# Patient Record
Sex: Male | Born: 1965 | Race: White | Hispanic: No | Marital: Married | Smoking: Former smoker
Health system: Southern US, Community
[De-identification: ages and names within clinical notes are randomized; demographics above are authoritative.]

## PROBLEM LIST (undated history)

## (undated) DIAGNOSIS — E119 Type 2 diabetes mellitus without complications: Secondary | ICD-10-CM

## (undated) DIAGNOSIS — M1990 Unspecified osteoarthritis, unspecified site: Secondary | ICD-10-CM

## (undated) DIAGNOSIS — K3184 Gastroparesis: Secondary | ICD-10-CM

## (undated) DIAGNOSIS — I10 Essential (primary) hypertension: Secondary | ICD-10-CM

## (undated) DIAGNOSIS — G8929 Other chronic pain: Secondary | ICD-10-CM

## (undated) DIAGNOSIS — E669 Obesity, unspecified: Secondary | ICD-10-CM

## (undated) DIAGNOSIS — E785 Hyperlipidemia, unspecified: Secondary | ICD-10-CM

## (undated) DIAGNOSIS — K219 Gastro-esophageal reflux disease without esophagitis: Secondary | ICD-10-CM

## (undated) DIAGNOSIS — K2270 Barrett's esophagus without dysplasia: Secondary | ICD-10-CM

## (undated) HISTORY — PX: HIP SURGERY: SHX245

## (undated) HISTORY — DX: Other chronic pain: G89.29

## (undated) HISTORY — PX: FRACTURE SURGERY: SHX138

## (undated) HISTORY — DX: Hyperlipidemia, unspecified: E78.5

## (undated) HISTORY — DX: Barrett's esophagus without dysplasia: K22.70

## (undated) HISTORY — DX: Gastroparesis: K31.84

## (undated) HISTORY — DX: Type 2 diabetes mellitus without complications: E11.9

## (undated) HISTORY — DX: Obesity, unspecified: E66.9

## (undated) HISTORY — DX: Unspecified osteoarthritis, unspecified site: M19.90

## (undated) HISTORY — DX: Gastro-esophageal reflux disease without esophagitis: K21.9

## (undated) HISTORY — PX: WRIST FRACTURE SURGERY: SHX121

## (undated) HISTORY — DX: Essential (primary) hypertension: I10

---

## 2005-03-04 HISTORY — PX: FRACTURE SURGERY: SHX138

## 2014-08-27 ENCOUNTER — Ambulatory Visit: Payer: Self-pay | Admitting: Family Medicine

## 2014-09-11 ENCOUNTER — Encounter: Payer: Self-pay | Admitting: Family Medicine

## 2014-09-11 ENCOUNTER — Encounter (INDEPENDENT_AMBULATORY_CARE_PROVIDER_SITE_OTHER): Payer: Self-pay

## 2014-09-11 ENCOUNTER — Ambulatory Visit (INDEPENDENT_AMBULATORY_CARE_PROVIDER_SITE_OTHER): Payer: Commercial Managed Care - HMO | Admitting: Family Medicine

## 2014-09-11 VITALS — BP 120/74 | HR 74 | Temp 97.3°F | Ht 73.0 in | Wt 272.2 lb

## 2014-09-11 DIAGNOSIS — I10 Essential (primary) hypertension: Secondary | ICD-10-CM

## 2014-09-11 DIAGNOSIS — M79673 Pain in unspecified foot: Secondary | ICD-10-CM

## 2014-09-11 DIAGNOSIS — E1165 Type 2 diabetes mellitus with hyperglycemia: Secondary | ICD-10-CM | POA: Insufficient documentation

## 2014-09-11 DIAGNOSIS — G8929 Other chronic pain: Secondary | ICD-10-CM

## 2014-09-11 DIAGNOSIS — M79671 Pain in right foot: Secondary | ICD-10-CM

## 2014-09-11 DIAGNOSIS — E1169 Type 2 diabetes mellitus with other specified complication: Secondary | ICD-10-CM | POA: Insufficient documentation

## 2014-09-11 DIAGNOSIS — I152 Hypertension secondary to endocrine disorders: Secondary | ICD-10-CM | POA: Insufficient documentation

## 2014-09-11 DIAGNOSIS — E785 Hyperlipidemia, unspecified: Secondary | ICD-10-CM | POA: Diagnosis not present

## 2014-09-11 DIAGNOSIS — IMO0002 Reserved for concepts with insufficient information to code with codable children: Secondary | ICD-10-CM

## 2014-09-11 DIAGNOSIS — E1159 Type 2 diabetes mellitus with other circulatory complications: Secondary | ICD-10-CM | POA: Insufficient documentation

## 2014-09-11 LAB — POCT GLYCOSYLATED HEMOGLOBIN (HGB A1C): Hemoglobin A1C: 7.1

## 2014-09-11 MED ORDER — OXYCODONE HCL 10 MG PO TABS: 10.0000 mg | ORAL_TABLET | Freq: Four times a day (QID) | ORAL | Status: DC

## 2014-09-11 MED ORDER — JANUVIA 100 MG PO TABS: 100.0000 mg | ORAL_TABLET | Freq: Every day | ORAL | Status: DC

## 2014-09-11 MED ORDER — METFORMIN HCL 1000 MG PO TABS: 1000.0000 mg | ORAL_TABLET | Freq: Two times a day (BID) | ORAL | Status: DC

## 2014-09-11 MED ORDER — SIMVASTATIN 20 MG PO TABS: 20.0000 mg | ORAL_TABLET | Freq: Every day | ORAL | Status: DC

## 2014-09-11 MED ORDER — GLIPIZIDE ER 10 MG PO TB24: 10.0000 mg | ORAL_TABLET | Freq: Every day | ORAL | Status: DC

## 2014-09-11 MED ORDER — LISINOPRIL 20 MG PO TABS: 20.0000 mg | ORAL_TABLET | Freq: Every day | ORAL | Status: DC

## 2014-09-11 MED ORDER — METFORMIN HCL 1000 MG PO TABS
1000.0000 mg | ORAL_TABLET | Freq: Two times a day (BID) | ORAL | Status: DC
Start: 2014-09-11 — End: 2014-09-11

## 2014-09-11 NOTE — Patient Instructions (Signed)
Diabetes and Exercise Exercising regularly is important. It is not just about losing weight. It has many health benefits, such as:  Improving your overall fitness, flexibility, and endurance.  Increasing your bone density.  Helping with weight control.  Decreasing your body fat.  Increasing your muscle strength.  Reducing stress and tension.  Improving your overall health. People with diabetes who exercise gain additional benefits because exercise:  Reduces appetite.  Improves the body's use of blood sugar (glucose).  Helps lower or control blood glucose.  Decreases blood pressure.  Helps control blood lipids (such as cholesterol and triglycerides).  Improves the body's use of the hormone insulin by:  Increasing the body's insulin sensitivity.  Reducing the body's insulin needs.  Decreases the risk for heart disease because exercising:  Lowers cholesterol and triglycerides levels.  Increases the levels of good cholesterol (such as high-density lipoproteins [HDL]) in the body.  Lowers blood glucose levels. YOUR ACTIVITY PLAN  Choose an activity that you enjoy and set realistic goals. Your health care provider or diabetes educator can help you make an activity plan that works for you. Exercise regularly as directed by your health care provider. This includes:  Performing resistance training twice a week such as push-ups, sit-ups, lifting weights, or using resistance bands.  Performing 150 minutes of cardio exercises each week such as walking, running, or playing sports.  Staying active and spending no more than 90 minutes at one time being inactive. Even short bursts of exercise are good for you. Three 10-minute sessions spread throughout the day are just as beneficial as a single 30-minute session. Some exercise ideas include:  Taking the dog for a walk.  Taking the stairs instead of the elevator.  Dancing to your favorite song.  Doing an exercise  video.  Doing your favorite exercise with a friend. RECOMMENDATIONS FOR EXERCISING WITH TYPE 1 OR TYPE 2 DIABETES   Check your blood glucose before exercising. If blood glucose levels are greater than 240 mg/dL, check for urine ketones. Do not exercise if ketones are present.  Avoid injecting insulin into areas of the body that are going to be exercised. For example, avoid injecting insulin into:  The arms when playing tennis.  The legs when jogging.  Keep a record of:  Food intake before and after you exercise.  Expected peak times of insulin action.  Blood glucose levels before and after you exercise.  The type and amount of exercise you have done.  Review your records with your health care provider. Your health care provider will help you to develop guidelines for adjusting food intake and insulin amounts before and after exercising.  If you take insulin or oral hypoglycemic agents, watch for signs and symptoms of hypoglycemia. They include:  Dizziness.  Shaking.  Sweating.  Chills.  Confusion.  Drink plenty of water while you exercise to prevent dehydration or heat stroke. Body water is lost during exercise and must be replaced.  Talk to your health care provider before starting an exercise program to make sure it is safe for you. Remember, almost any type of activity is better than none. Document Released: 03/13/2003 Document Revised: 05/07/2013 Document Reviewed: 05/30/2012 [employer] Patient Information 2015 [employer]. This information is not intended to replace advice given to you by your health care provider. Make sure you discuss any questions you have with your health care provider.  

## 2014-09-11 NOTE — Progress Notes (Signed)
BP 120/74 mmHg  Pulse 74  Temp(Src) 97.3 F (36.3 C) (Oral)  Ht _0  (1.854 m)  Wt 272 lb 3.2 oz (123.469 kg)  BMI 35.92 kg/m2   Subjective:    Patient ID: Timothy Bouche., male    DOB: 05-06-1965, 49 y.o.   MRN: [id number]  HPI: Timothy Pocock. is a 49 y.o. male presenting on 09/11/2014 for Establish Care   HPI Hypertension Patient presents today as a new patient for checkup of his hypertension. He is currently on lisinopril 20 mg. Per patient his blood pressure has been controlled on this medication for quite some time. Patient denies headaches, blurred vision, chest pains, shortness of breath, or weakness. Denies any side effects from medication and is content with current medication.  Diabetes type 2 Patient has type 2 diabetes and is currently moved down here from [place]. He is currently on glipizide 10 mg and Januvia 100 mg and metformin 1000 mg twice daily. The A1c today is 7.1. Patient has not been consistent in his diabetic monitoring, but has taken his medications regularly. Over the past month or 2 he has been in the process of moving down and traveling back and forth between here in [place] and has admittedly not eating as well as he had before. He denies any visual changes or tingling or numbness in his feet.  Hyperlipidemia Patient is taking simvastatin 20 mg currently has not had a cholesterol recheck in a little bit. He denies any myalgias or liver problems in the past from the simvastatin and is agreeable to continue taking it. Has not had any cardiac history or any young family cardiac history is either.  Chronic foot pain Patient fell from a roof quite a few years ago fracturing both tib-fib bilaterally. He has had a few surgeries on his feet for this event has fusion of the bones in his feet. He has been on oxycodone 10 mg 4 times a day for some time has been controlled on that dose.  Relevant past medical, surgical, family and social history reviewed and updated as  indicated. Interim medical history since our last visit reviewed. Allergies and medications reviewed and updated.  Review of Systems  Constitutional: Negative for fever and chills.  HENT: Negative for ear discharge, ear pain, postnasal drip and sore throat.   Eyes: Negative for discharge, redness and visual disturbance.  Respiratory: Negative for chest tightness, shortness of breath and wheezing.   Cardiovascular: Negative for chest pain, palpitations and leg swelling.  Gastrointestinal: Negative for abdominal pain, diarrhea and constipation.  Endocrine: Negative for cold intolerance, heat intolerance, polydipsia, polyphagia and polyuria.  Genitourinary: Negative for difficulty urinating.  Musculoskeletal: Positive for arthralgias (feet and ankles) and gait problem. Negative for back pain.  Skin: Negative for rash.  Neurological: Negative for dizziness, syncope, light-headedness and headaches.  All other systems reviewed and are negative.   Per HPI unless specifically indicated above     Medication List       This list is accurate as of: 09/11/14 11:31 AM.  Always use your most recent med list.               glipiZIDE 10 MG 24 hr tablet  Commonly known as:  GLUCOTROL XL  Take 1 tablet (10 mg total) by mouth daily.     JANUVIA 100 MG tablet  Generic drug:  sitaGLIPtin  Take 1 tablet (100 mg total) by mouth daily.     lisinopril 20 MG tablet  Commonly known as:  PRINIVIL,ZESTRIL  Take 1 tablet (20 mg total) by mouth daily.     metFORMIN 1000 MG tablet  Commonly known as:  GLUCOPHAGE  Take 1 tablet (1,000 mg total) by mouth 2 (two) times daily.     Oxycodone HCl 10 MG Tabs  Take 1 tablet (10 mg total) by mouth 4 (four) times daily.     simvastatin 20 MG tablet  Commonly known as:  ZOCOR  Take 1 tablet (20 mg total) by mouth daily at 6 PM.           Objective:    BP 120/74 mmHg  Pulse 74  Temp(Src) 97.3 F (36.3 C) (Oral)  Ht _0  (1.854 m)  Wt 272 lb 3.2  oz (123.469 kg)  BMI 35.92 kg/m2  Wt Readings from Last 3 Encounters:  09/11/14 272 lb 3.2 oz (123.469 kg)    Physical Exam  Constitutional: He is oriented to person, place, and time. He appears well-developed and well-nourished. No distress.  Eyes: Conjunctivae and EOM are normal. Pupils are equal, round, and reactive to light. Right eye exhibits no discharge. No scleral icterus.  Neck: Neck supple. No thyromegaly present.  Cardiovascular: Normal rate, regular rhythm, normal heart sounds and intact distal pulses.   No murmur heard. Pulmonary/Chest: Effort normal and breath sounds normal. No respiratory distress. He has no wheezes.  Abdominal: Soft. Bowel sounds are normal. He exhibits no distension. There is no tenderness.  Musculoskeletal: He exhibits no edema.  Decreased range of motion in both ankles with scar lines anteriorly from where it had surgeries  Lymphadenopathy:    He has no cervical adenopathy.  Neurological: He is alert and oriented to person, place, and time. Coordination normal.  Skin: Skin is warm and dry. No rash noted. He is not diaphoretic.  Psychiatric: He has a normal mood and affect. His behavior is normal.  Vitals reviewed.   Results for orders placed or performed in visit on 09/11/14  POCT glycosylated hemoglobin (Hb A1C)  Result Value Ref Range   Hemoglobin A1C 7.1       Assessment & Plan:   Problem List Items Addressed This Visit      Cardiovascular and Mediastinum   Essential hypertension, benign    He is currently on lisinopril more for his renal protection from diabetes then for blood pressure but blood pressure is controlled today so we will continue      Relevant Medications   lisinopril (PRINIVIL,ZESTRIL) 20 MG tablet   simvastatin (ZOCOR) 20 MG tablet     Other   Diabetes type 2, uncontrolled - Primary    Hemoglobin A1c is 7.1 today. We will continue his lip is a Januvia and metformin for his diabetes, but he will likely need additional  medication such as insulin in the future but he is resistant today. We'll rediscuss at a later date. He still takes his lisinopril and he is also on a statin.      Relevant Medications   glipiZIDE (GLUCOTROL XL) 10 MG 24 hr tablet   JANUVIA 100 MG tablet   lisinopril (PRINIVIL,ZESTRIL) 20 MG tablet   metFORMIN (GLUCOPHAGE) 1000 MG tablet   simvastatin (ZOCOR) 20 MG tablet   Other Relevant Orders   POCT glycosylated hemoglobin (Hb A1C) (Completed)   Thyroid Panel With TSH (Completed)   CMP14+EGFR (Completed)   Hyperlipidemia LDL goal <100    Patient currently on simvastatin, we will continue it for today and check his lab values.  Relevant Medications   lisinopril (PRINIVIL,ZESTRIL) 20 MG tablet   simvastatin (ZOCOR) 20 MG tablet   Other Relevant Orders   Lipid panel (Completed)   Chronic foot pain    Patient has chronic foot pain and is about oxycodone 10 mg 4 times daily. We will continue for today and discuss referral to pain management at a later date.      Relevant Medications   Oxycodone HCl 10 MG TABS       Follow up plan: Return in about 3 months (around 12/11/2014), or if symptoms worsen or fail to improve.  Caryl Pina, MD [facility] 09/11/2014, 11:31 AM

## 2014-09-12 LAB — CMP14+EGFR
ALT: 28 IU/L (ref 0–44)
AST: 21 IU/L (ref 0–40)
Albumin/Globulin Ratio: 1.8 (ref 1.1–2.5)
Albumin: 4.4 g/dL (ref 3.5–5.5)
Alkaline Phosphatase: 92 IU/L (ref 39–117)
BUN/Creatinine Ratio: 19 (ref 9–20)
BUN: 14 mg/dL (ref 6–24)
Bilirubin Total: <0.2 mg/dL (ref 0.0–1.2)
CO2: 22 mmol/L (ref 18–29)
Calcium: 9.8 mg/dL (ref 8.7–10.2)
Chloride: 99 mmol/L (ref 97–108)
Creatinine, Ser: 0.73 mg/dL — ABNORMAL LOW (ref 0.76–1.27)
GFR calc Af Amer: 126 mL/min/{1.73_m2} (ref >59)
GFR calc non Af Amer: 109 mL/min/{1.73_m2} (ref >59)
Globulin, Total: 2.5 g/dL (ref 1.5–4.5)
Glucose: 115 mg/dL — ABNORMAL HIGH (ref 65–99)
Potassium: 4.5 mmol/L (ref 3.5–5.2)
Sodium: 138 mmol/L (ref 134–144)
Total Protein: 6.9 g/dL (ref 6.0–8.5)

## 2014-09-12 LAB — LIPID PANEL
Chol/HDL Ratio: 3.4 ratio units (ref 0.0–5.0)
Cholesterol, Total: 144 mg/dL (ref 100–199)
HDL: 42 mg/dL (ref >39)
LDL Calculated: 62 mg/dL (ref 0–99)
Triglycerides: 198 mg/dL — ABNORMAL HIGH (ref 0–149)
VLDL Cholesterol Cal: 40 mg/dL (ref 5–40)

## 2014-09-12 LAB — THYROID PANEL WITH TSH
Free Thyroxine Index: 2.1 (ref 1.2–4.9)
T3 Uptake Ratio: 24 % (ref 24–39)
T4, Total: 8.7 ug/dL (ref 4.5–12.0)
TSH: 1.27 u[IU]/mL (ref 0.450–4.500)

## 2014-09-12 NOTE — Assessment & Plan Note (Signed)
Patient currently on simvastatin, we will continue it for today and check his lab values.

## 2014-09-12 NOTE — Assessment & Plan Note (Signed)
He is currently on lisinopril more for his renal protection from diabetes then for blood pressure but blood pressure is controlled today so we will continue

## 2014-09-12 NOTE — Assessment & Plan Note (Signed)
Patient has chronic foot pain and is about oxycodone 10 mg 4 times daily. We will continue for today and discuss referral to pain management at a later date.

## 2014-09-12 NOTE — Assessment & Plan Note (Signed)
Hemoglobin A1c is 7.1 today. We will continue his lip is a Januvia and metformin for his diabetes, but he will likely need additional medication such as insulin in the future but he is resistant today. We'll rediscuss at a later date. He still takes his lisinopril and he is also on a statin.

## 2014-09-18 ENCOUNTER — Telehealth: Payer: Self-pay | Admitting: Family Medicine

## 2014-09-18 NOTE — Telephone Encounter (Signed)
Erasmo Downer took care of this

## 2014-09-18 NOTE — Telephone Encounter (Signed)
Done 09/11/14

## 2014-10-03 DIAGNOSIS — E119 Type 2 diabetes mellitus without complications: Secondary | ICD-10-CM | POA: Diagnosis not present

## 2014-10-03 DIAGNOSIS — E109 Type 1 diabetes mellitus without complications: Secondary | ICD-10-CM | POA: Diagnosis not present

## 2014-10-03 DIAGNOSIS — I10 Essential (primary) hypertension: Secondary | ICD-10-CM | POA: Diagnosis not present

## 2014-10-03 DIAGNOSIS — H5210 Myopia, unspecified eye: Secondary | ICD-10-CM | POA: Diagnosis not present

## 2014-10-03 LAB — HM DIABETES EYE EXAM

## 2014-10-09 ENCOUNTER — Ambulatory Visit (INDEPENDENT_AMBULATORY_CARE_PROVIDER_SITE_OTHER): Payer: Commercial Managed Care - HMO | Admitting: Family Medicine

## 2014-10-09 ENCOUNTER — Encounter: Payer: Self-pay | Admitting: Family Medicine

## 2014-10-09 VITALS — BP 131/78 | HR 89 | Temp 97.5°F | Ht 73.0 in | Wt 276.4 lb

## 2014-10-09 DIAGNOSIS — E785 Hyperlipidemia, unspecified: Secondary | ICD-10-CM

## 2014-10-09 DIAGNOSIS — I10 Essential (primary) hypertension: Secondary | ICD-10-CM

## 2014-10-09 DIAGNOSIS — G8929 Other chronic pain: Secondary | ICD-10-CM | POA: Diagnosis not present

## 2014-10-09 DIAGNOSIS — IMO0001 Reserved for inherently not codable concepts without codable children: Secondary | ICD-10-CM

## 2014-10-09 DIAGNOSIS — M79671 Pain in right foot: Secondary | ICD-10-CM

## 2014-10-09 DIAGNOSIS — E1165 Type 2 diabetes mellitus with hyperglycemia: Secondary | ICD-10-CM

## 2014-10-09 MED ORDER — ACCU-CHEK AVIVA DEVI: Status: AC

## 2014-10-09 MED ORDER — OXYCODONE HCL 10 MG PO TABS: 10.0000 mg | ORAL_TABLET | Freq: Four times a day (QID) | ORAL | Status: DC

## 2014-10-09 MED ORDER — GLUCOSE BLOOD VI STRP
ORAL_STRIP | Status: DC
Start: 2014-10-09 — End: 2016-01-15

## 2014-10-09 MED ORDER — SIMVASTATIN 20 MG PO TABS: 20.0000 mg | ORAL_TABLET | Freq: Every day | ORAL | Status: DC

## 2014-10-09 MED ORDER — ACCU-CHEK SOFTCLIX LANCET DEV MISC: Status: DC

## 2014-10-09 NOTE — Progress Notes (Signed)
BP 131/78 mmHg  Pulse 89  Temp(Src) 97.5 F (36.4 C) (Oral)  Ht _0  (1.854 m)  Wt 276 lb 6.4 oz (125.374 kg)  BMI 36.47 kg/m2   Subjective:    Patient ID: Timothy Bouche., male    DOB: 02-27-1965, 49 y.o.   MRN: [id number]  HPI: Timothy Eveleth. is a 49 y.o. male presenting on 10/09/2014 for Medication Refill and Diabetic supplies needed   HPI Hypertension Patient here for hypertension recheck and blood pressure is controlled. Continue current medication.  Diabetes Patient here for recheck on type 2 diabetes and needs a refill on his meter and strips. His last A1c was 1 month ago and it was 7.1. He says he has been getting in the 120-140 range in early in the morning. Denies any tingling or numbness in his feet that is new. Plans to go see ophthalmologist.  Hyperlipidemia Last levels were doing good except for her triglycerides were elevated. We will continue his current dose of statin and recommended diet and exercise to reduce the triglycerides. Patient denies headaches, blurred vision, chest pains, shortness of breath, or weakness. Denies any side effects from medication and is content with current medication.  Chronic foot pain Here for refill of medications.   Relevant past medical, surgical, family and social history reviewed and updated as indicated. Interim medical history since our last visit reviewed. Allergies and medications reviewed and updated.  Review of Systems  Constitutional: Negative for fever.  HENT: Negative for ear discharge and ear pain.   Eyes: Negative for discharge and visual disturbance.  Respiratory: Negative for chest tightness, shortness of breath and wheezing.   Cardiovascular: Negative for chest pain, palpitations and leg swelling.  Gastrointestinal: Negative for abdominal pain, diarrhea and constipation.  Genitourinary: Negative for difficulty urinating.  Musculoskeletal: Positive for arthralgias (nothing changed or new). Negative for back  pain and gait problem.  Skin: Negative for rash.  Neurological: Negative for dizziness, syncope, light-headedness and headaches.  All other systems reviewed and are negative.   Per HPI unless specifically indicated above     Medication List       This list is accurate as of: 10/09/14  1:58 PM.  Always use your most recent med list.               ACCU-CHEK AVIVA device  Use as instructed     accu-chek softclix lancets  Use as instructed     glipiZIDE 10 MG 24 hr tablet  Commonly known as:  GLUCOTROL XL  Take 1 tablet (10 mg total) by mouth daily.     glucose blood test strip  Commonly known as:  ACCU-CHEK AVIVA  Use as instructed     JANUVIA 100 MG tablet  Generic drug:  sitaGLIPtin  Take 1 tablet (100 mg total) by mouth daily.     lisinopril 20 MG tablet  Commonly known as:  PRINIVIL,ZESTRIL  Take 1 tablet (20 mg total) by mouth daily.     metFORMIN 1000 MG tablet  Commonly known as:  GLUCOPHAGE  Take 1 tablet (1,000 mg total) by mouth 2 (two) times daily.     Oxycodone HCl 10 MG Tabs  Take 1 tablet (10 mg total) by mouth 4 (four) times daily.     Oxycodone HCl 10 MG Tabs  Take 1 tablet (10 mg total) by mouth 4 (four) times daily.     Oxycodone HCl 10 MG Tabs  Take 1 tablet (10 mg total) by mouth  4 (four) times daily.     simvastatin 20 MG tablet  Commonly known as:  ZOCOR  Take 1 tablet (20 mg total) by mouth daily at 6 PM.           Objective:    BP 131/78 mmHg  Pulse 89  Temp(Src) 97.5 F (36.4 C) (Oral)  Ht _0  (1.854 m)  Wt 276 lb 6.4 oz (125.374 kg)  BMI 36.47 kg/m2  Wt Readings from Last 3 Encounters:  10/09/14 276 lb 6.4 oz (125.374 kg)  09/11/14 272 lb 3.2 oz (123.469 kg)    Physical Exam  Constitutional: He is oriented to person, place, and time. He appears well-developed and well-nourished. No distress.  Eyes: Conjunctivae and EOM are normal. Right eye exhibits no discharge. No scleral icterus.  Cardiovascular: Normal rate,  regular rhythm, normal heart sounds and intact distal pulses.   No murmur heard. Pulmonary/Chest: Effort normal and breath sounds normal. No respiratory distress. He has no wheezes.  Musculoskeletal: Normal range of motion. He exhibits edema (1+ bilateral lower extremities).  Neurological: He is alert and oriented to person, place, and time. Coordination normal.  Skin: Skin is warm and dry. No rash noted. He is not diaphoretic.  Psychiatric: He has a normal mood and affect. His behavior is normal.  Vitals reviewed.   Results for orders placed or performed in visit on 09/11/14  Lipid panel  Result Value Ref Range   Cholesterol, Total 144 100 - 199 mg/dL   Triglycerides 198 (H) 0 - 149 mg/dL   HDL 42 >39 mg/dL   VLDL Cholesterol Cal 40 5 - 40 mg/dL   LDL Calculated 62 0 - 99 mg/dL   Chol/HDL Ratio 3.4 0.0 - 5.0 ratio units  Thyroid Panel With TSH  Result Value Ref Range   TSH 1.270 0.450 - 4.500 uIU/mL   T4, Total 8.7 4.5 - 12.0 ug/dL   T3 Uptake Ratio 24 24 - 39 %   Free Thyroxine Index 2.1 1.2 - 4.9  CMP14+EGFR  Result Value Ref Range   Glucose 115 (H) 65 - 99 mg/dL   BUN 14 6 - 24 mg/dL   Creatinine, Ser 0.73 (L) 0.76 - 1.27 mg/dL   GFR calc non Af Amer 109 >59 mL/min/1.73   GFR calc Af Amer 126 >59 mL/min/1.73   BUN/Creatinine Ratio 19 9 - 20   Sodium 138 134 - 144 mmol/L   Potassium 4.5 3.5 - 5.2 mmol/L   Chloride 99 97 - 108 mmol/L   CO2 22 18 - 29 mmol/L   Calcium 9.8 8.7 - 10.2 mg/dL   Total Protein 6.9 6.0 - 8.5 g/dL   Albumin 4.4 3.5 - 5.5 g/dL   Globulin, Total 2.5 1.5 - 4.5 g/dL   Albumin/Globulin Ratio 1.8 1.1 - 2.5   Bilirubin Total <0.2 0.0 - 1.2 mg/dL   Alkaline Phosphatase 92 39 - 117 IU/L   AST 21 0 - 40 IU/L   ALT 28 0 - 44 IU/L  POCT glycosylated hemoglobin (Hb A1C)  Result Value Ref Range   Hemoglobin A1C 7.1       Assessment & Plan:   Problem List Items Addressed This Visit      Cardiovascular and Mediastinum   Essential hypertension, benign  - Primary   Relevant Medications   simvastatin (ZOCOR) 20 MG tablet     Endocrine   Diabetes type 2, uncontrolled (HCC)   Relevant Medications   simvastatin (ZOCOR) 20 MG tablet  Other   Hyperlipidemia LDL goal <100   Relevant Medications   simvastatin (ZOCOR) 20 MG tablet   Chronic foot pain   Relevant Medications   Oxycodone HCl 10 MG TABS       Follow up plan: Return in about 3 months (around 01/09/2015), or if symptoms worsen or fail to improve, for diabetes/ htn.  Caryl Pina, MD [facility] 10/09/2014, 1:58 PM

## 2014-10-14 ENCOUNTER — Encounter: Payer: Self-pay | Admitting: *Deleted

## 2014-11-07 DIAGNOSIS — F1220 Cannabis dependence, uncomplicated: Secondary | ICD-10-CM | POA: Diagnosis not present

## 2014-11-07 DIAGNOSIS — I10 Essential (primary) hypertension: Secondary | ICD-10-CM | POA: Diagnosis not present

## 2014-11-07 DIAGNOSIS — E669 Obesity, unspecified: Secondary | ICD-10-CM | POA: Diagnosis not present

## 2014-11-07 DIAGNOSIS — M958 Other specified acquired deformities of musculoskeletal system: Secondary | ICD-10-CM | POA: Diagnosis not present

## 2014-11-07 DIAGNOSIS — E785 Hyperlipidemia, unspecified: Secondary | ICD-10-CM | POA: Diagnosis not present

## 2014-11-07 DIAGNOSIS — E119 Type 2 diabetes mellitus without complications: Secondary | ICD-10-CM | POA: Diagnosis not present

## 2014-11-07 DIAGNOSIS — Z7984 Long term (current) use of oral hypoglycemic drugs: Secondary | ICD-10-CM | POA: Diagnosis not present

## 2014-11-07 DIAGNOSIS — Z6835 Body mass index (BMI) 35.0-35.9, adult: Secondary | ICD-10-CM | POA: Diagnosis not present

## 2014-11-07 DIAGNOSIS — F17200 Nicotine dependence, unspecified, uncomplicated: Secondary | ICD-10-CM | POA: Diagnosis not present

## 2014-12-12 ENCOUNTER — Encounter: Payer: Self-pay | Admitting: Family Medicine

## 2014-12-12 ENCOUNTER — Ambulatory Visit (INDEPENDENT_AMBULATORY_CARE_PROVIDER_SITE_OTHER): Payer: Commercial Managed Care - HMO | Admitting: Family

## 2014-12-12 ENCOUNTER — Encounter (INDEPENDENT_AMBULATORY_CARE_PROVIDER_SITE_OTHER): Payer: Self-pay

## 2014-12-12 VITALS — BP 128/81 | HR 88 | Temp 98.0°F | Ht 73.0 in | Wt 278.6 lb

## 2014-12-12 DIAGNOSIS — E1165 Type 2 diabetes mellitus with hyperglycemia: Secondary | ICD-10-CM

## 2014-12-12 DIAGNOSIS — I10 Essential (primary) hypertension: Secondary | ICD-10-CM | POA: Diagnosis not present

## 2014-12-12 DIAGNOSIS — IMO0001 Reserved for inherently not codable concepts without codable children: Secondary | ICD-10-CM

## 2014-12-12 DIAGNOSIS — E785 Hyperlipidemia, unspecified: Secondary | ICD-10-CM

## 2014-12-12 LAB — POCT GLYCOSYLATED HEMOGLOBIN (HGB A1C): Hemoglobin A1C: 7

## 2014-12-13 LAB — MICROALBUMIN / CREATININE URINE RATIO
Creatinine, Urine: 140.8 mg/dL
MICROALB/CREAT RATIO: 18 mg/g creat (ref 0.0–30.0)
Microalbumin, Urine: 25.4 ug/mL

## 2014-12-19 ENCOUNTER — Encounter: Payer: Self-pay | Admitting: Family Medicine

## 2014-12-19 ENCOUNTER — Ambulatory Visit (INDEPENDENT_AMBULATORY_CARE_PROVIDER_SITE_OTHER): Payer: Commercial Managed Care - HMO | Admitting: Family Medicine

## 2014-12-19 VITALS — BP 129/83 | HR 72 | Temp 97.0°F | Ht 73.0 in | Wt 281.0 lb

## 2014-12-19 DIAGNOSIS — E1165 Type 2 diabetes mellitus with hyperglycemia: Secondary | ICD-10-CM

## 2014-12-19 DIAGNOSIS — IMO0001 Reserved for inherently not codable concepts without codable children: Secondary | ICD-10-CM

## 2014-12-19 DIAGNOSIS — M6530 Trigger finger, unspecified finger: Secondary | ICD-10-CM | POA: Diagnosis not present

## 2014-12-19 MED ORDER — METHYLPREDNISOLONE ACETATE 80 MG/ML IJ SUSP
40.0000 mg | Freq: Once | INTRAMUSCULAR | Status: AC
  Administered 2014-12-19: 40 mg via INTRALESIONAL

## 2014-12-19 NOTE — Progress Notes (Signed)
BP 129/83 mmHg  Pulse 72  Temp(Src) 97 F (36.1 C) (Oral)  Ht 6\' 1"  (1.854 m)  Wt 281 lb (127.461 kg)  BMI 37.08 kg/m2   Subjective:    Patient ID: Timothy Bouche., male    DOB: 10-Nov-1965, 49 y.o.   MRN: [id number]  HPI: Timothy Sagal. is a 49 y.o. male presenting on 12/19/2014 for Follow-up   HPI Diabetes recheck  Patient was here a few A1c last week and his hemoglobin A1c came back as 7.0. His previous hemoglobin A1c was 7.1. He still having nerve problems in his feet from his ankles and the issues that he's had from his ankle injuries and surgeries. There are no new skin breakdown that he knows of. He still continues to get the callus on the lateral aspect of his soles bilaterally. He has an appointment with a podiatrist. His insurance will be changing soon and he wants to hold off on medication changes until that point.  Trigger finger on his right thumb Patient complains of having his thumb refills like he has a lot of pain in it and then it gets stuck when he tries to straighten it out all the way at the DIP joint. He does have tenderness over the metacarpal of the thumb. He denies any skin changes or erythema or warmth. He has had this for a couple weeks this time but has had this previously in his life before as well.  Relevant past medical, surgical, family and social history reviewed and updated as indicated. Interim medical history since our last visit reviewed. Allergies and medications reviewed and updated.  Review of Systems  Constitutional: Negative for fever and chills.  HENT: Negative for ear discharge and ear pain.   Eyes: Negative for discharge and visual disturbance.  Respiratory: Negative for shortness of breath and wheezing.   Cardiovascular: Negative for chest pain and leg swelling.  Gastrointestinal: Negative for abdominal pain, diarrhea and constipation.  Genitourinary: Negative for difficulty urinating.  Musculoskeletal: Positive for arthralgias and  gait problem. Negative for back pain.  Skin: Negative for rash.  Neurological: Positive for numbness. Negative for dizziness, syncope, weakness, light-headedness and headaches.  All other systems reviewed and are negative.   Per HPI unless specifically indicated above     Medication List       This list is accurate as of: 12/19/14  8:24 AM.  Always use your most recent med list.               ACCU-CHEK AVIVA device  Use as instructed     accu-chek softclix lancets  Use as instructed     glipiZIDE 10 MG 24 hr tablet  Commonly known as:  GLUCOTROL XL  Take 1 tablet (10 mg total) by mouth daily.     glucose blood test strip  Commonly known as:  ACCU-CHEK AVIVA  Use as instructed     JANUVIA 100 MG tablet  Generic drug:  sitaGLIPtin  Take 1 tablet (100 mg total) by mouth daily.     lisinopril 20 MG tablet  Commonly known as:  PRINIVIL,ZESTRIL  Take 1 tablet (20 mg total) by mouth daily.     metFORMIN 1000 MG tablet  Commonly known as:  GLUCOPHAGE  Take 1 tablet (1,000 mg total) by mouth 2 (two) times daily.     Oxycodone HCl 10 MG Tabs  Take 1 tablet (10 mg total) by mouth 4 (four) times daily.     Oxycodone HCl 10  MG Tabs  Take 1 tablet (10 mg total) by mouth 4 (four) times daily.     Oxycodone HCl 10 MG Tabs  Take 1 tablet (10 mg total) by mouth 4 (four) times daily.     simvastatin 20 MG tablet  Commonly known as:  ZOCOR  Take 1 tablet (20 mg total) by mouth daily at 6 PM.           Objective:    BP 129/83 mmHg  Pulse 72  Temp(Src) 97 F (36.1 C) (Oral)  Ht 6\' 1"  (1.854 m)  Wt 281 lb (127.461 kg)  BMI 37.08 kg/m2  Wt Readings from Last 3 Encounters:  12/19/14 281 lb (127.461 kg)  12/12/14 278 lb 9.6 oz (126.372 kg)  10/09/14 276 lb 6.4 oz (125.374 kg)    Physical Exam  Constitutional: He is oriented to person, place, and time. He appears well-developed and well-nourished. No distress.  Eyes: Conjunctivae and EOM are normal. Pupils are  equal, round, and reactive to light. Right eye exhibits no discharge. No scleral icterus.  Neck: Neck supple. No thyromegaly present.  Cardiovascular: Normal rate, regular rhythm, normal heart sounds and intact distal pulses.   No murmur heard. Pulmonary/Chest: Effort normal and breath sounds normal. No respiratory distress. He has no wheezes.  Musculoskeletal: Normal range of motion. He exhibits no edema.  Lymphadenopathy:    He has no cervical adenopathy.  Neurological: He is alert and oriented to person, place, and time. Coordination normal.  Skin: Skin is warm and dry. No rash noted. He is not diaphoretic.  Psychiatric: He has a normal mood and affect. His behavior is normal.  Vitals reviewed.  Diabetic Foot Exam - Simple   Simple Foot Form  Diabetic Foot exam was performed with the following findings:  Yes 12/19/2014  8:20 AM  Visual Inspection  See comments:  Yes  Sensation Testing  See comments:  Yes  Pulse Check  Posterior Tibialis and Dorsalis pulse intact bilaterally:  Yes  Comments  Patient has had bilateral ankle surgeries which has adjusted the way his gait falls. He has calluses on the lateral sole of his foot on the pad. No skin breakdown noted. He also has loss of sensation on his right foot in most of the foot from his previous surgeries and issues. Left foot sensation is mostly intact.       Results for orders placed or performed in visit on 12/12/14  Microalbumin / creatinine urine ratio  Result Value Ref Range   Creatinine, Urine 140.8 Not Estab. mg/dL   Microalbum.,U,Random 25.4 Not Estab. ug/mL   MICROALB/CREAT RATIO 18.0 0.0 - 30.0 mg/g creat  POCT glycosylated hemoglobin (Hb A1C)  Result Value Ref Range   Hemoglobin A1C 7.0    Trigger finger injection: Risk factors of bleeding and infection discussed with patient and patient is agreeable towards injection. Understands issues with steroids and diabetes and will monitor closely. Patient prepped with  Betadine. Injected directly into the thenar eminence where the fibrotic nodule is noted on exam. Injected 40 mg of Depo-Medrol and 1 mL of 2% lidocaine. Patient tolerated procedure well and no side effects from noted. Minimal to no bleeding. Simple bandage applied after.     Assessment & Plan:   Problem List Items Addressed This Visit      Endocrine   Diabetes type 2, uncontrolled ([place]) - Primary    Wants to wait until next time before adding any medications       Other Visit  Diagnoses    Trigger finger        Relevant Medications    methylPREDNISolone acetate (DEPO-MEDROL) injection 40 mg        Follow up plan: Return in about 3 months (around 03/19/2015), or if symptoms worsen or fail to improve, for Follow-up diabetes.  Counseling provided for all of the vaccine components No orders of the defined types were placed in this encounter.    Caryl Pina, MD [facility] 12/19/2014, 8:24 AM

## 2014-12-19 NOTE — Assessment & Plan Note (Signed)
Wants to wait until next time before adding any medications

## 2015-01-09 ENCOUNTER — Encounter: Payer: Self-pay | Admitting: Family Medicine

## 2015-01-09 ENCOUNTER — Ambulatory Visit (INDEPENDENT_AMBULATORY_CARE_PROVIDER_SITE_OTHER): Payer: PPO | Admitting: Family Medicine

## 2015-01-09 VITALS — BP 131/88 | HR 90 | Temp 97.4°F | Ht 73.0 in | Wt 283.6 lb

## 2015-01-09 DIAGNOSIS — K219 Gastro-esophageal reflux disease without esophagitis: Secondary | ICD-10-CM | POA: Insufficient documentation

## 2015-01-09 DIAGNOSIS — Z8249 Family history of ischemic heart disease and other diseases of the circulatory system: Secondary | ICD-10-CM

## 2015-01-09 DIAGNOSIS — E1165 Type 2 diabetes mellitus with hyperglycemia: Secondary | ICD-10-CM | POA: Diagnosis not present

## 2015-01-09 DIAGNOSIS — M79671 Pain in right foot: Secondary | ICD-10-CM | POA: Diagnosis not present

## 2015-01-09 DIAGNOSIS — G8929 Other chronic pain: Secondary | ICD-10-CM | POA: Diagnosis not present

## 2015-01-09 DIAGNOSIS — E785 Hyperlipidemia, unspecified: Secondary | ICD-10-CM | POA: Diagnosis not present

## 2015-01-09 DIAGNOSIS — I10 Essential (primary) hypertension: Secondary | ICD-10-CM | POA: Diagnosis not present

## 2015-01-09 DIAGNOSIS — IMO0001 Reserved for inherently not codable concepts without codable children: Secondary | ICD-10-CM

## 2015-01-09 MED ORDER — LISINOPRIL 20 MG PO TABS: 20.0000 mg | ORAL_TABLET | Freq: Every day | ORAL | Status: DC

## 2015-01-09 MED ORDER — OXYCODONE HCL 10 MG PO TABS: 10.0000 mg | ORAL_TABLET | Freq: Four times a day (QID) | ORAL | Status: DC

## 2015-01-09 MED ORDER — SIMVASTATIN 20 MG PO TABS: 20.0000 mg | ORAL_TABLET | Freq: Every day | ORAL | Status: DC

## 2015-01-09 MED ORDER — METFORMIN HCL 1000 MG PO TABS: 1000.0000 mg | ORAL_TABLET | Freq: Two times a day (BID) | ORAL | Status: DC

## 2015-01-09 MED ORDER — JANUVIA 100 MG PO TABS: 100.0000 mg | ORAL_TABLET | Freq: Every day | ORAL | Status: DC

## 2015-01-09 MED ORDER — OMEPRAZOLE 20 MG PO CPDR: 20.0000 mg | DELAYED_RELEASE_CAPSULE | Freq: Every day | ORAL | Status: DC

## 2015-01-09 MED ORDER — GLIPIZIDE ER 10 MG PO TB24: 10.0000 mg | ORAL_TABLET | Freq: Every day | ORAL | Status: DC

## 2015-01-09 NOTE — Progress Notes (Signed)
BP 131/88 mmHg  Pulse 90  Temp(Src) 97.4 F (36.3 C) (Oral)  Ht 6\' 1"  (1.854 m)  Wt 283 lb 9.6 oz (128.64 kg)  BMI 37.42 kg/m2   Subjective:    Patient ID: Timothy Nelson., male    DOB: [birth year], 50 y.o.   MRN: [id number]  HPI: Timothy Nelson. is a 50 y.o. male presenting on 01/09/2015 for Annual Exam   HPI Hypertension recheck Patient blood pressure today is 131/88 and he is doing good on his medications. Patient denies headaches, blurred vision, chest pains, shortness of breath, or weakness. Denies any side effects from medication and is content with current medication.   GERD Patient is coming in today for GERD recheck and has been doing well and his dose and would like to continue it. Denies any blood in stool or any heartburn.  Diabetes type 2 Patient has type 2 diabetes and says that his blood sugars have been much improved from previous and that he is getting down into the 100s to 110's in the mornings and his postmeal sugars have been in the 150s. His last A1c was 7.0 last month. We will recheck in a few months and see how is doing at that time and then reconsider whether we need to add any medications. He is currently taking glipizide 10 mg and Januvia 100 mg and metformin 1000 twice a day.  Hyperlipidemia Patient is taking simvastatin 20 mg and is not having any issues with currently cramping in his legs. He is not a recheck just yet and will reevaluate at his next visit.  Chronic foot pain Patient has chronic foot pain from fractures and repairs that he has left his feet and ankles deformed. He is taking his current pain meds and they have been doing well for him and he needs a refill for them.  Relevant past medical, surgical, family and social history reviewed and updated as indicated. Interim medical history since our last visit reviewed. Allergies and medications reviewed and updated.  Review of Systems  Constitutional: Negative for fever and chills.  HENT:  Negative for congestion, ear discharge and ear pain.   Eyes: Negative for discharge and visual disturbance.  Respiratory: Negative for cough, chest tightness, shortness of breath and wheezing.   Cardiovascular: Positive for leg swelling. Negative for chest pain.  Gastrointestinal: Negative for abdominal pain, diarrhea, constipation and abdominal distention.  Genitourinary: Negative for difficulty urinating.  Musculoskeletal: Positive for arthralgias. Negative for back pain and gait problem.  Skin: Negative for rash.  Neurological: Negative for dizziness, syncope, light-headedness and headaches.  All other systems reviewed and are negative.   Per HPI unless specifically indicated above     Medication List       This list is accurate as of: 01/09/15 10:59 AM.  Always use your most recent med list.               ACCU-CHEK AVIVA device  Use as instructed     accu-chek softclix lancets  Use as instructed     glipiZIDE 10 MG 24 hr tablet  Commonly known as:  GLUCOTROL XL  Take 1 tablet (10 mg total) by mouth daily.     glucose blood test strip  Commonly known as:  ACCU-CHEK AVIVA  Use as instructed     JANUVIA 100 MG tablet  Generic drug:  sitaGLIPtin  Take 1 tablet (100 mg total) by mouth daily.     lisinopril 20 MG tablet  Commonly known  as:  PRINIVIL,ZESTRIL  Take 1 tablet (20 mg total) by mouth daily.     metFORMIN 1000 MG tablet  Commonly known as:  GLUCOPHAGE  Take 1 tablet (1,000 mg total) by mouth 2 (two) times daily.     omeprazole 20 MG capsule  Commonly known as:  PRILOSEC  Take 1 capsule (20 mg total) by mouth daily.     Oxycodone HCl 10 MG Tabs  Take 1 tablet (10 mg total) by mouth 4 (four) times daily.     Oxycodone HCl 10 MG Tabs  Take 1 tablet (10 mg total) by mouth 4 (four) times daily.     Oxycodone HCl 10 MG Tabs  Take 1 tablet (10 mg total) by mouth 4 (four) times daily.     simvastatin 20 MG tablet  Commonly known as:  ZOCOR  Take 1  tablet (20 mg total) by mouth daily at 6 PM.           Objective:    BP 131/88 mmHg  Pulse 90  Temp(Src) 97.4 F (36.3 C) (Oral)  Ht 6\' 1"  (1.854 m)  Wt 283 lb 9.6 oz (128.64 kg)  BMI 37.42 kg/m2  Wt Readings from Last 3 Encounters:  01/09/15 283 lb 9.6 oz (128.64 kg)  12/19/14 281 lb (127.461 kg)  12/12/14 278 lb 9.6 oz (126.372 kg)    Physical Exam  Constitutional: He is oriented to person, place, and time. He appears well-developed and well-nourished. No distress.  Eyes: Conjunctivae and EOM are normal. Pupils are equal, round, and reactive to light. Right eye exhibits no discharge. No scleral icterus.  Neck: Neck supple. No thyromegaly present.  Cardiovascular: Normal rate, regular rhythm, normal heart sounds and intact distal pulses.   No murmur heard. Pulmonary/Chest: Effort normal and breath sounds normal. No respiratory distress. He has no wheezes.  Musculoskeletal: Normal range of motion. He exhibits no edema.  Significant swelling pain and deformity in both ankles from previous repairs from fractures. Chronic issue  Lymphadenopathy:    He has no cervical adenopathy.  Neurological: He is alert and oriented to person, place, and time. Coordination normal.  Skin: Skin is warm and dry. No rash noted. He is not diaphoretic.  Psychiatric: He has a normal mood and affect. His behavior is normal.  Vitals reviewed.   Results for orders placed or performed in visit on 12/12/14  Microalbumin / creatinine urine ratio  Result Value Ref Range   Creatinine, Urine 140.8 Not Estab. mg/dL   Microalbum.,U,Random 25.4 Not Estab. ug/mL   MICROALB/CREAT RATIO 18.0 0.0 - 30.0 mg/g creat  POCT glycosylated hemoglobin (Hb A1C)  Result Value Ref Range   Hemoglobin A1C 7.0       Assessment & Plan:   Problem List Items Addressed This Visit      Cardiovascular and Mediastinum   Essential hypertension, benign   Relevant Medications   simvastatin (ZOCOR) 20 MG tablet   lisinopril  (PRINIVIL,ZESTRIL) 20 MG tablet   Other Relevant Orders   Ambulatory referral to Cardiology     Digestive   GERD (gastroesophageal reflux disease)   Relevant Medications   omeprazole (PRILOSEC) 20 MG capsule     Endocrine   Diabetes type 2, uncontrolled ([facility])   Relevant Medications   simvastatin (ZOCOR) 20 MG tablet   metFORMIN (GLUCOPHAGE) 1000 MG tablet   lisinopril (PRINIVIL,ZESTRIL) 20 MG tablet   JANUVIA 100 MG tablet   glipiZIDE (GLUCOTROL XL) 10 MG 24 hr tablet     Other  Hyperlipidemia LDL goal <100 - Primary   Relevant Medications   simvastatin (ZOCOR) 20 MG tablet   lisinopril (PRINIVIL,ZESTRIL) 20 MG tablet   Other Relevant Orders   Ambulatory referral to Cardiology   Chronic foot pain   Relevant Medications   Oxycodone HCl 10 MG TABS   Oxycodone HCl 10 MG TABS   Oxycodone HCl 10 MG TABS    Other Visit Diagnoses    Family history of early CAD        Relevant Orders    Ambulatory referral to Cardiology        Follow up plan: Return in about 3 months (around 04/09/2015), or if symptoms worsen or fail to improve, for dm, htn recheck.  Counseling provided for all of the vaccine components No orders of the defined types were placed in this encounter.    Caryl Pina, MD [facility] 01/09/2015, 10:59 AM

## 2015-01-28 ENCOUNTER — Ambulatory Visit (INDEPENDENT_AMBULATORY_CARE_PROVIDER_SITE_OTHER): Payer: PPO | Admitting: Cardiology

## 2015-01-28 ENCOUNTER — Encounter: Payer: Self-pay | Admitting: Cardiology

## 2015-01-28 VITALS — BP 124/78 | HR 81 | Ht 73.0 in | Wt 281.0 lb

## 2015-01-28 DIAGNOSIS — E782 Mixed hyperlipidemia: Secondary | ICD-10-CM

## 2015-01-28 DIAGNOSIS — E669 Obesity, unspecified: Secondary | ICD-10-CM

## 2015-01-28 DIAGNOSIS — E119 Type 2 diabetes mellitus without complications: Secondary | ICD-10-CM | POA: Diagnosis not present

## 2015-01-28 DIAGNOSIS — R9431 Abnormal electrocardiogram [ECG] [EKG]: Secondary | ICD-10-CM | POA: Diagnosis not present

## 2015-01-28 DIAGNOSIS — I10 Essential (primary) hypertension: Secondary | ICD-10-CM

## 2015-01-28 DIAGNOSIS — Z8249 Family history of ischemic heart disease and other diseases of the circulatory system: Secondary | ICD-10-CM | POA: Diagnosis not present

## 2015-01-28 NOTE — Progress Notes (Signed)
Cardiology Office Note  Date: 01/28/2015   ID: Timothy Bouche., DOB [birth year], MRN [id number]  PCP: Worthy Rancher, MD  Consulting Cardiologist: Rozann Lesches, MD   Chief Complaint  Patient presents with  . Cardiac evaluation    History of Present Illness: Timothy Nelson. is a 50 y.o. male referred for cardiology consultation by Dr. Vonna Kotyk Dettinger. I reviewed available records and the recent office notes from [facility]. He presents reporting no specific angina symptoms or unusual shortness of breath with his typical activities. He is concerned however that he is at the age that his father experienced a heart attack with subsequent need for bypass surgery. He states that he has never had a stress test for risk stratification. Cardiac risk factors include age and gender, family history of premature CAD, hyperlipidemia, hypertension, and type 2 diabetes mellitus.  He tells me that he moved to this area from [place]. He has a history of chronic pain following a fall from a roof approximately 10 years ago resulting and multiple fractures in his legs and subsequent surgery. Medication list includes oxycodone. He reports limited ability to ambulate due to chronic leg pain and ankle swelling.  Poor quality copy of old ECG (cannot read date) shows sinus rhythm with poor R-wave progression and leftward axis.  In talking with him about his diabetes, he states that he has had trouble with blood sugar for at least 10 years, does not sound to have been very well controlled over time. His recent lab work showed hemoglobin A1c of 7. Lipid panel reveals triglycerides 198, but LDL 62. He has tolerated Zocor, but states that he had trouble with fenofibrate.  Past Medical History  Diagnosis Date  . Hyperlipidemia   . Essential hypertension   . Type 2 diabetes mellitus ([place])   . Arthritis   . GERD (gastroesophageal reflux disease)   . Chronic pain     Past Surgical History  Procedure Laterality  Date  . Fracture surgery  March 2007    Left arm, left thumb, bilateral legs    Current Outpatient Prescriptions  Medication Sig Dispense Refill  . Blood Glucose Monitoring Suppl (ACCU-CHEK AVIVA) device Use as instructed 1 each 0  . glipiZIDE (GLUCOTROL XL) 10 MG 24 hr tablet Take 1 tablet (10 mg total) by mouth daily. 90 tablet 0  . glucose blood (ACCU-CHEK AVIVA) test strip Use as instructed 100 each 12  . JANUVIA 100 MG tablet Take 1 tablet (100 mg total) by mouth daily. 90 tablet 0  . Lancet Devices (ACCU-CHEK SOFTCLIX) lancets Use as instructed 1 each 0  . lisinopril (PRINIVIL,ZESTRIL) 20 MG tablet Take 1 tablet (20 mg total) by mouth daily. 90 tablet 0  . metFORMIN (GLUCOPHAGE) 1000 MG tablet Take 1 tablet (1,000 mg total) by mouth 2 (two) times daily. 180 tablet 0  . omeprazole (PRILOSEC) 20 MG capsule Take 1 capsule (20 mg total) by mouth daily. 30 capsule 3  . Oxycodone HCl 10 MG TABS Take 1 tablet (10 mg total) by mouth 4 (four) times daily. 120 tablet 0  . Oxycodone HCl 10 MG TABS Take 1 tablet (10 mg total) by mouth 4 (four) times daily. 120 tablet 0  . Oxycodone HCl 10 MG TABS Take 1 tablet (10 mg total) by mouth 4 (four) times daily. 120 tablet 0  . simvastatin (ZOCOR) 20 MG tablet Take 1 tablet (20 mg total) by mouth daily at 6 PM. 90 tablet 0   No current  facility-administered medications for this visit.   Allergies:  Fenofibrate   Social History: The patient  reports that he has been smoking Cigarettes.  He has a 20 pack-year smoking history. He has never used smokeless tobacco. He reports that he drinks about 0.6 oz of alcohol per week. He reports that he does not use illicit drugs.   Family History: The patient's family history includes Arthritis in his mother; Diabetes in his father and mother; Heart disease in his father; Hyperlipidemia in his mother; Hypertension in his mother and sister.   ROS:  Please see the history of present illness. Otherwise, complete review  of systems is positive for chronic leg pain and arthritis.  All other systems are reviewed and negative.   Physical Exam: VS:  BP 124/78 mmHg  Pulse 81  Ht 6\' 1"  (1.854 m)  Wt 281 lb (127.461 kg)  BMI 37.08 kg/m2  SpO2 95%, BMI Body mass index is 37.08 kg/(m^2).  Wt Readings from Last 3 Encounters:  01/28/15 281 lb (127.461 kg)  01/09/15 283 lb 9.6 oz (128.64 kg)  12/19/14 281 lb (127.461 kg)    General: Obese male, appears comfortable at rest. HEENT: Conjunctiva and lids normal, oropharynx clear. Neck: Supple, no elevated JVP or carotid bruits, no thyromegaly. Lungs: Clear to auscultation, nonlabored breathing at rest. Cardiac: Regular rate and rhythm, no S3 or significant systolic murmur, no pericardial rub. Abdomen: Soft, nontender, bowel sounds present, no guarding or rebound. Extremities: Mild ankle edema, distal pulses 2+. Skin: Warm and dry. Tattoos on arms and thorax. Musculoskeletal: No kyphosis. Neuropsychiatric: Alert and oriented x3, affect grossly appropriate.  ECG: ECG is not obtained today.  Recent Labwork: 09/11/2014: ALT 28; AST 21; BUN 14; Creatinine, Ser 0.73*; Potassium 4.5; Sodium 138; TSH 1.270     Component Value Date/Time   CHOL 144 09/11/2014 1115   TRIG 198* 09/11/2014 1115   HDL 42 09/11/2014 1115   CHOLHDL 3.4 09/11/2014 1115   LDLCALC 62 09/11/2014 1115   Assessment and Plan:  1. Cardiac evaluation in a 50 year old male with personal history of type 2 diabetes mellitus, hyperlipidemia, and hypertension, as well as family history of premature CAD in his father. ECG abnormal as noted above. He has not undergone any previous ischemic testing. Plan will be to obtain a Lexiscan Cardiolite for further objective evaluation, he indicates that he would not be able to ambulatory on a treadmill due to his chronic leg pain and limitations.  2. History of hyperlipidemia, recent LDL 62. He is tolerating Zocor.  3. History of hypertension, blood pressure is  adequately controlled today.  4. Type 2 diabetes mellitus, on Glucotrol XL, Januvia, and Glucophage. Recent hemoglobin A1c 7.0.  5. Obesity and relative inactivity. Weight loss would be beneficial.  Current medicines were reviewed with the patient today.   Orders Placed This Encounter  Procedures  . NM Myocar Multi W/Spect W/Wall Motion / EF    Disposition: Call with results.   Signed, Satira Sark, MD, [facility] 01/28/2015 9:26 AM    [facility]. [address], [place], [place] [address] Phone: [phone]; Fax: [phone]

## 2015-01-28 NOTE — Patient Instructions (Signed)
Your physician recommends that you schedule a follow-up appointment in: to be determined after testing   Your physician has requested that you have a lexiscan myoview. For further information please visit [email or web address]. Please follow instruction sheet, as given.   Your physician recommends that you continue on your current medications as directed. Please refer to the Current Medication list given to you today.    Thank you for choosing [facility] !

## 2015-01-29 ENCOUNTER — Encounter (HOSPITAL_COMMUNITY)
Admission: RE | Admit: 2015-01-29 | Discharge: 2015-01-29 | Disposition: A | Discharge status: Home / Self Care | Payer: PPO | Source: Ambulatory Visit | Attending: Cardiology | Admitting: Cardiology

## 2015-01-29 ENCOUNTER — Inpatient Hospital Stay (HOSPITAL_COMMUNITY): Admission: RE | Admit: 2015-01-29 | Payer: PPO | Source: Ambulatory Visit

## 2015-01-29 ENCOUNTER — Encounter (HOSPITAL_COMMUNITY): Payer: Self-pay

## 2015-01-29 DIAGNOSIS — R9431 Abnormal electrocardiogram [ECG] [EKG]: Secondary | ICD-10-CM | POA: Diagnosis not present

## 2015-01-29 LAB — NM MYOCAR MULTI W/SPECT W/WALL MOTION / EF
LV dias vol: 37 mL
LV sys vol: 89 mL
Peak HR: 100 {beats}/min
RATE: 0.34
Rest HR: 81 {beats}/min
SDS: 4
SRS: 1
SSS: 5
TID: 1

## 2015-01-29 MED ORDER — TECHNETIUM TC 99M SESTAMIBI GENERIC - CARDIOLITE
10.0000 | Freq: Once | INTRAVENOUS | Status: AC | PRN
  Administered 2015-01-29: 10 via INTRAVENOUS

## 2015-01-29 MED ORDER — SODIUM CHLORIDE 0.9 % IJ SOLN
INTRAMUSCULAR | Status: AC
  Administered 2015-01-29: 10 mL via INTRAVENOUS
  Filled 2015-01-29: qty 3

## 2015-01-29 MED ORDER — REGADENOSON 0.4 MG/5ML IV SOLN
INTRAVENOUS | Status: AC
  Administered 2015-01-29: 0.4 mg via INTRAVENOUS
  Filled 2015-01-29: qty 5

## 2015-01-29 MED ORDER — TECHNETIUM TC 99M SESTAMIBI - CARDIOLITE
30.0000 | Freq: Once | INTRAVENOUS | Status: AC | PRN
  Administered 2015-01-29: 10:00:00 30 via INTRAVENOUS

## 2015-02-24 ENCOUNTER — Telehealth: Payer: Self-pay | Admitting: Family Medicine

## 2015-02-24 DIAGNOSIS — K219 Gastro-esophageal reflux disease without esophagitis: Secondary | ICD-10-CM

## 2015-02-24 NOTE — Telephone Encounter (Signed)
Pt aware referral ordered.

## 2015-02-25 ENCOUNTER — Encounter (INDEPENDENT_AMBULATORY_CARE_PROVIDER_SITE_OTHER): Payer: Self-pay | Admitting: *Deleted

## 2015-03-24 ENCOUNTER — Ambulatory Visit (INDEPENDENT_AMBULATORY_CARE_PROVIDER_SITE_OTHER): Payer: PPO | Admitting: Internal Medicine

## 2015-04-02 ENCOUNTER — Other Ambulatory Visit (INDEPENDENT_AMBULATORY_CARE_PROVIDER_SITE_OTHER): Payer: Self-pay | Admitting: Internal Medicine

## 2015-04-02 ENCOUNTER — Encounter (INDEPENDENT_AMBULATORY_CARE_PROVIDER_SITE_OTHER): Payer: Self-pay | Admitting: Internal Medicine

## 2015-04-02 ENCOUNTER — Ambulatory Visit (INDEPENDENT_AMBULATORY_CARE_PROVIDER_SITE_OTHER): Payer: PPO | Admitting: Internal Medicine

## 2015-04-02 VITALS — BP 124/82 | HR 72 | Temp 97.7°F | Ht 73.0 in | Wt 284.1 lb

## 2015-04-02 DIAGNOSIS — K219 Gastro-esophageal reflux disease without esophagitis: Secondary | ICD-10-CM | POA: Diagnosis not present

## 2015-04-02 NOTE — Progress Notes (Signed)
Subjective:    Patient ID: Timothy Brymer., male    DOB: Jun 10, 1965, 50 y.o.   MRN: [id number]  HPI Referred by Dr. Warrick Parisian for a lot of burning sensation in his esophagus. He has a lot of burping. He has excess flatus He was started Omeprazole in January and he say it has not helped.  He usually has a BM daily. He says for the past 2 weeks he has not been regular. No melena or BRRB. His appetite is good. No weight loss. He says the burning is not related to eating.   When he burps he says it smell like "poop" or sulfa.  He has no problems eating any particular foods. If he eats greens, he will have gas.  He would like for Dr. Laural Golden to look at his esophagus.   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 Next colonoscopy in 10 yrs.    Review of Systems Past Medical History  Diagnosis Date  . Hyperlipidemia   . Essential hypertension   . Type 2 diabetes mellitus ([place])   . Arthritis   . GERD (gastroesophageal reflux disease)   . Chronic pain     Past Surgical History  Procedure Laterality Date  . Fracture surgery  March 2007    Left arm, left thumb, bilateral legs    Allergies  Allergen Reactions  . Fenofibrate     Current Outpatient Prescriptions on File Prior to Visit  Medication Sig Dispense Refill  . glipiZIDE (GLUCOTROL XL) 10 MG 24 hr tablet Take 1 tablet (10 mg total) by mouth daily. 90 tablet 0  . JANUVIA 100 MG tablet Take 1 tablet (100 mg total) by mouth daily. 90 tablet 0  . lisinopril (PRINIVIL,ZESTRIL) 20 MG tablet Take 1 tablet (20 mg total) by mouth daily. 90 tablet 0  . metFORMIN (GLUCOPHAGE) 1000 MG tablet Take 1 tablet (1,000 mg total) by mouth 2 (two) times daily. 180 tablet 0  . omeprazole (PRILOSEC) 20 MG capsule Take 1 capsule (20 mg total) by mouth daily. 30 capsule 3  . Oxycodone HCl 10 MG TABS Take 1 tablet (10 mg total) by mouth 4 (four) times daily. (Patient taking differently: Take 10 mg by mouth. Some  days he takes two and some days he may take 5) 120 tablet 0  . simvastatin (ZOCOR) 20 MG tablet Take 1 tablet (20 mg total) by mouth daily at 6 PM. 90 tablet 0  . Blood Glucose Monitoring Suppl (ACCU-CHEK AVIVA) device Use as instructed 1 each 0  . glucose blood (ACCU-CHEK AVIVA) test strip Use as instructed 100 each 12  . Lancet Devices (ACCU-CHEK SOFTCLIX) lancets Use as instructed 1 each 0  . Oxycodone HCl 10 MG TABS Take 1 tablet (10 mg total) by mouth 4 (four) times daily. (Patient not taking: Reported on 04/02/2015) 120 tablet 0  . Oxycodone HCl 10 MG TABS Take 1 tablet (10 mg total) by mouth 4 (four) times daily. (Patient not taking: Reported on 04/02/2015) 120 tablet 0   No current facility-administered medications on file prior to visit.        Objective:   Physical Exam Blood pressure 124/82, pulse 72, temperature 97.7 F (36.5 C), height 6\' 1"  (1.854 m), weight 284 lb 1.6 oz (128.867 kg).  Alert and oriented. Skin warm and dry. Oral mucosa is moist.   . Sclera anicteric, conjunctivae is pink. Thyroid not enlarged. No cervical lymphadenopathy. Lungs clear.  Heart regular rate and rhythm.  Abdomen is soft. Bowel sounds are positive. No hepatomegaly. No abdominal masses felt. No tenderness.  No edema to lower extremities.         Assessment & Plan:  GERD. PUD needs to be ruled out.  Increase Omeprazole to 20mg  BID.  EGD with propofol. The risks and benefits such as perforation, bleeding, and infection were reviewed with the patient and is agreeable.

## 2015-04-02 NOTE — Patient Instructions (Addendum)
EGD with propofol. The risks and benefits such as perforation, bleeding, and infection were reviewed with the patient and is agreeable. 

## 2015-04-03 ENCOUNTER — Encounter (HOSPITAL_COMMUNITY): Payer: Self-pay

## 2015-04-03 ENCOUNTER — Encounter (HOSPITAL_COMMUNITY)
Admission: RE | Admit: 2015-04-03 | Discharge: 2015-04-03 | Disposition: A | Discharge status: Home / Self Care | Payer: PPO | Source: Ambulatory Visit | Attending: Internal Medicine | Admitting: Internal Medicine

## 2015-04-03 DIAGNOSIS — K319 Disease of stomach and duodenum, unspecified: Secondary | ICD-10-CM | POA: Diagnosis not present

## 2015-04-03 DIAGNOSIS — Z79899 Other long term (current) drug therapy: Secondary | ICD-10-CM | POA: Diagnosis not present

## 2015-04-03 DIAGNOSIS — R12 Heartburn: Secondary | ICD-10-CM | POA: Diagnosis present

## 2015-04-03 DIAGNOSIS — Z01812 Encounter for preprocedural laboratory examination: Secondary | ICD-10-CM | POA: Diagnosis not present

## 2015-04-03 DIAGNOSIS — K219 Gastro-esophageal reflux disease without esophagitis: Secondary | ICD-10-CM | POA: Diagnosis not present

## 2015-04-03 DIAGNOSIS — K2270 Barrett's esophagus without dysplasia: Secondary | ICD-10-CM | POA: Diagnosis not present

## 2015-04-03 DIAGNOSIS — Z7984 Long term (current) use of oral hypoglycemic drugs: Secondary | ICD-10-CM | POA: Diagnosis not present

## 2015-04-03 DIAGNOSIS — I10 Essential (primary) hypertension: Secondary | ICD-10-CM | POA: Diagnosis not present

## 2015-04-03 DIAGNOSIS — E119 Type 2 diabetes mellitus without complications: Secondary | ICD-10-CM | POA: Diagnosis not present

## 2015-04-03 DIAGNOSIS — M1991 Primary osteoarthritis, unspecified site: Secondary | ICD-10-CM | POA: Diagnosis not present

## 2015-04-03 DIAGNOSIS — F17210 Nicotine dependence, cigarettes, uncomplicated: Secondary | ICD-10-CM | POA: Diagnosis not present

## 2015-04-03 LAB — BASIC METABOLIC PANEL
Anion gap: 11 (ref 5–15)
BUN: 16 mg/dL (ref 6–20)
CO2: 21 mmol/L — ABNORMAL LOW (ref 22–32)
Calcium: 9.2 mg/dL (ref 8.9–10.3)
Chloride: 103 mmol/L (ref 101–111)
Creatinine, Ser: 0.87 mg/dL (ref 0.61–1.24)
GFR calc Af Amer: >60 mL/min (ref >60)
GFR calc non Af Amer: >60 mL/min (ref >60)
Glucose, Bld: 208 mg/dL — ABNORMAL HIGH (ref 65–99)
Potassium: 4.3 mmol/L (ref 3.5–5.1)
Sodium: 135 mmol/L (ref 135–145)

## 2015-04-03 LAB — CBC
HCT: 47.6 % (ref 39.0–52.0)
Hemoglobin: 16.7 g/dL (ref 13.0–17.0)
MCH: 32.2 pg (ref 26.0–34.0)
MCHC: 35.1 g/dL (ref 30.0–36.0)
MCV: 91.7 fL (ref 78.0–100.0)
Platelets: 219 10*3/uL (ref 150–400)
RBC: 5.19 MIL/uL (ref 4.22–5.81)
RDW: 13.2 % (ref 11.5–15.5)
WBC: 11.1 10*3/uL — ABNORMAL HIGH (ref 4.0–10.5)

## 2015-04-03 NOTE — Patient Instructions (Signed)
70    Your procedure is scheduled on: 04/04/2015  Report to [facility] at 7:00    AM.  Call this number if you have problems the morning of surgery: [phone]   Remember:   Do not drink or eat food:After Midnight.      Take these medicines the morning of surgery with A SIP OF WATER: lisinopril and prilosec   Do not wear jewelry, make-up or nail polish.  Do not wear lotions, powders, or perfumes. You may wear deodorant.  Do not shave 48 hours prior to surgery. Men may shave face and neck.  Do not bring valuables to the hospital.  Contacts, dentures or bridgework may not be worn into surgery.  Leave suitcase in the car. After surgery it may be brought to your room.  For patients admitted to the hospital, checkout time is 11:00 AM the day of discharge.   Patients discharged the day of surgery will not be allowed to drive home.  Name and phone number of your driver:    Please read over the following fact sheets that you were given: Pain Booklet, Lab Information and Anesthesia Post-op Instructions   Endoscopy Care After Please read the instructions outlined below and refer to this sheet in the next few weeks. These discharge instructions provide you with general information on caring for yourself after you leave the hospital. Your doctor may also give you specific instructions. While your treatment has been planned according to the most current medical practices available, unavoidable complications occasionally occur. If you have any problems or questions after discharge, please call your doctor. HOME CARE INSTRUCTIONS Activity  You may resume your regular activity but move at a slower pace for the next 24 hours.   Take frequent rest periods for the next 24 hours.   Walking will help expel (get rid of) the air and reduce the bloated feeling in your abdomen.   No driving for 24 hours (because of the anesthesia (medicine) used during the test).   You may shower.   Do not sign any  important legal documents or operate any machinery for 24 hours (because of the anesthesia used during the test).  Nutrition  Drink plenty of fluids.   You may resume your normal diet.   Begin with a light meal and progress to your normal diet.   Avoid alcoholic beverages for 24 hours or as instructed by your caregiver.  Medications You may resume your normal medications unless your caregiver tells you otherwise. What you can expect today  You may experience abdominal discomfort such as a feeling of fullness or "gas" pains.   You may experience a sore throat for 2 to 3 days. This is normal. Gargling with salt water may help this.  Follow-up Your doctor will discuss the results of your test with you. SEEK IMMEDIATE MEDICAL CARE IF:  You have excessive nausea (feeling sick to your stomach) and/or vomiting.   You have severe abdominal pain and distention (swelling).   You have trouble swallowing.   You have a temperature over 100 F (37.8 C).   You have rectal bleeding or vomiting of blood.  Document Released: 08/05/2003 Document Revised: 12/10/2010 Document Reviewed: 02/15/2007

## 2015-04-04 ENCOUNTER — Encounter (HOSPITAL_COMMUNITY)
Admission: RE | Disposition: A | Discharge status: Home / Self Care | Payer: Self-pay | Source: Ambulatory Visit | Attending: Internal Medicine

## 2015-04-04 ENCOUNTER — Encounter (HOSPITAL_COMMUNITY): Payer: Self-pay | Admitting: Anesthesiology

## 2015-04-04 ENCOUNTER — Ambulatory Visit (HOSPITAL_COMMUNITY): Payer: PPO | Admitting: Anesthesiology

## 2015-04-04 ENCOUNTER — Ambulatory Visit (HOSPITAL_COMMUNITY)
Admission: RE | Admit: 2015-04-04 | Discharge: 2015-04-04 | Disposition: A | Discharge status: Home / Self Care | Payer: PPO | Source: Ambulatory Visit | Attending: Internal Medicine | Admitting: Internal Medicine

## 2015-04-04 DIAGNOSIS — K2270 Barrett's esophagus without dysplasia: Secondary | ICD-10-CM | POA: Insufficient documentation

## 2015-04-04 DIAGNOSIS — K319 Disease of stomach and duodenum, unspecified: Secondary | ICD-10-CM | POA: Insufficient documentation

## 2015-04-04 DIAGNOSIS — K2970 Gastritis, unspecified, without bleeding: Secondary | ICD-10-CM | POA: Diagnosis not present

## 2015-04-04 DIAGNOSIS — I10 Essential (primary) hypertension: Secondary | ICD-10-CM | POA: Insufficient documentation

## 2015-04-04 DIAGNOSIS — Z7984 Long term (current) use of oral hypoglycemic drugs: Secondary | ICD-10-CM | POA: Insufficient documentation

## 2015-04-04 DIAGNOSIS — E119 Type 2 diabetes mellitus without complications: Secondary | ICD-10-CM | POA: Insufficient documentation

## 2015-04-04 DIAGNOSIS — Z01812 Encounter for preprocedural laboratory examination: Secondary | ICD-10-CM | POA: Diagnosis not present

## 2015-04-04 DIAGNOSIS — Z79899 Other long term (current) drug therapy: Secondary | ICD-10-CM | POA: Insufficient documentation

## 2015-04-04 DIAGNOSIS — K219 Gastro-esophageal reflux disease without esophagitis: Secondary | ICD-10-CM | POA: Insufficient documentation

## 2015-04-04 DIAGNOSIS — K3189 Other diseases of stomach and duodenum: Secondary | ICD-10-CM | POA: Diagnosis not present

## 2015-04-04 DIAGNOSIS — M1991 Primary osteoarthritis, unspecified site: Secondary | ICD-10-CM | POA: Insufficient documentation

## 2015-04-04 DIAGNOSIS — F17210 Nicotine dependence, cigarettes, uncomplicated: Secondary | ICD-10-CM | POA: Insufficient documentation

## 2015-04-04 HISTORY — PX: ESOPHAGOGASTRODUODENOSCOPY (EGD) WITH PROPOFOL: SHX5813

## 2015-04-04 LAB — GLUCOSE, CAPILLARY
Glucose-Capillary: 210 mg/dL — ABNORMAL HIGH (ref 65–99)
Glucose-Capillary: 169 mg/dL — ABNORMAL HIGH (ref 65–99)

## 2015-04-04 SURGERY — ESOPHAGOGASTRODUODENOSCOPY (EGD) WITH PROPOFOL
Anesthesia: Monitor Anesthesia Care

## 2015-04-04 MED ORDER — ONDANSETRON HCL 4 MG/2ML IJ SOLN
INTRAMUSCULAR | Status: AC
  Filled 2015-04-04: qty 2

## 2015-04-04 MED ORDER — PROPOFOL 10 MG/ML IV BOLUS
INTRAVENOUS | Status: AC
  Filled 2015-04-04: qty 20

## 2015-04-04 MED ORDER — METOCLOPRAMIDE HCL 10 MG PO TABS: 10.0000 mg | ORAL_TABLET | Freq: Three times a day (TID) | ORAL | Status: DC

## 2015-04-04 MED ORDER — LIDOCAINE VISCOUS 2 % MT SOLN
3.0000 mL | Freq: Once | OROMUCOSAL | Status: AC
  Administered 2015-04-04: 3 mL via OROMUCOSAL

## 2015-04-04 MED ORDER — ONDANSETRON HCL 4 MG/2ML IJ SOLN
4.0000 mg | Freq: Once | INTRAMUSCULAR | Status: AC
  Administered 2015-04-04: 4 mg via INTRAVENOUS

## 2015-04-04 MED ORDER — MIDAZOLAM HCL 2 MG/2ML IJ SOLN
1.0000 mg | INTRAMUSCULAR | Status: DC | PRN
  Administered 2015-04-04 (×2): 2 mg via INTRAVENOUS
  Filled 2015-04-04: qty 2

## 2015-04-04 MED ORDER — LACTATED RINGERS IV SOLN
INTRAVENOUS | Status: DC
  Administered 2015-04-04: 1000 mL via INTRAVENOUS

## 2015-04-04 MED ORDER — LIDOCAINE VISCOUS 2 % MT SOLN
OROMUCOSAL | Status: AC
  Filled 2015-04-04: qty 15

## 2015-04-04 MED ORDER — GLYCOPYRROLATE 0.2 MG/ML IJ SOLN
0.2000 mg | Freq: Once | INTRAMUSCULAR | Status: AC
  Administered 2015-04-04: 0.2 mg via INTRAVENOUS

## 2015-04-04 MED ORDER — GLYCOPYRROLATE 0.2 MG/ML IJ SOLN
INTRAMUSCULAR | Status: AC
  Filled 2015-04-04: qty 1

## 2015-04-04 MED ORDER — FENTANYL CITRATE (PF) 100 MCG/2ML IJ SOLN: 25.0000 ug | INTRAMUSCULAR | Status: DC | PRN

## 2015-04-04 MED ORDER — ONDANSETRON HCL 4 MG/2ML IJ SOLN: 4.0000 mg | Freq: Once | INTRAMUSCULAR | Status: DC | PRN

## 2015-04-04 MED ORDER — MIDAZOLAM HCL 2 MG/2ML IJ SOLN
INTRAMUSCULAR | Status: AC
  Filled 2015-04-04: qty 2

## 2015-04-04 MED ORDER — LIDOCAINE HCL (PF) 1 % IJ SOLN
INTRAMUSCULAR | Status: AC
  Filled 2015-04-04: qty 5

## 2015-04-04 MED ORDER — MIDAZOLAM HCL 5 MG/5ML IJ SOLN
INTRAMUSCULAR | Status: DC | PRN
  Administered 2015-04-04: 2 mg via INTRAVENOUS

## 2015-04-04 MED ORDER — FENTANYL CITRATE (PF) 100 MCG/2ML IJ SOLN
INTRAMUSCULAR | Status: AC
  Filled 2015-04-04: qty 2

## 2015-04-04 MED ORDER — PROPOFOL 500 MG/50ML IV EMUL
INTRAVENOUS | Status: DC | PRN
  Administered 2015-04-04: 09:00:00 via INTRAVENOUS
  Administered 2015-04-04: 150 ug/kg/min via INTRAVENOUS

## 2015-04-04 MED ORDER — FENTANYL CITRATE (PF) 100 MCG/2ML IJ SOLN
25.0000 ug | INTRAMUSCULAR | Status: AC
  Administered 2015-04-04 (×2): 25 ug via INTRAVENOUS

## 2015-04-04 NOTE — Transfer of Care (Signed)
Immediate Anesthesia Transfer of Care Note  Patient: Timothy Nelson.  Procedure(s) Performed: Procedure(s) with comments: ESOPHAGOGASTRODUODENOSCOPY (EGD) WITH PROPOFOL (N/A) - 8:30  Patient Location: PACU  Anesthesia Type:MAC  Level of Consciousness: awake, alert , oriented and patient cooperative  Airway & Oxygen Therapy: Patient Spontanous Breathing and Patient connected to nasal cannula oxygen  Post-op Assessment: Report given to RN and Post -op Vital signs reviewed and stable  Post vital signs: Reviewed and stable  Last Vitals:  Filed Vitals:   04/04/15 0850 04/04/15 0855  BP: 121/78 107/78  Temp:    Resp: 20 23    Complications: No apparent anesthesia complications

## 2015-04-04 NOTE — Anesthesia Procedure Notes (Signed)
Procedure Name: MAC Date/Time: 04/04/2015 8:58 AM Performed by: Andree Elk, AMY A Pre-anesthesia Checklist: Patient identified, Emergency Drugs available, Suction available, Patient being monitored and Timeout performed Oxygen Delivery Method: Simple face mask

## 2015-04-04 NOTE — H&P (Signed)
Timothy Nelson. is an 50 y.o. male.   Chief Complaint: Patient is here for EGD. HPI: Patient is 50 year old Caucasian male who presents with three-month history of heartburn and frequent burping was not responded to omeprazole. His wife states his parents are foul-smelling. He denies dysphagia nausea vomiting abdominal pain melena or rectal bleeding. He smokes a pack of cigarettes per day. He drinks alcohol no more than 1 or 2 drinks per week. He does not take OTC NSAIDs.  Past Medical History  Diagnosis Date  . Hyperlipidemia   . Essential hypertension   . Type 2 diabetes mellitus ([place])   . Arthritis   . GERD (gastroesophageal reflux disease)   . Chronic pain     Past Surgical History  Procedure Laterality Date  . Fracture surgery  March 2007    Left arm, left thumb, bilateral legs  . Wrist fracture surgery      Family History  Problem Relation Age of Onset  . Diabetes Mother   . Hypertension Mother   . Hyperlipidemia Mother   . Arthritis Mother   . Diabetes Father   . Heart disease Father     Age 52  . Hypertension Sister    Social History:  reports that he has been smoking Cigarettes.  He has a 20 pack-year smoking history. He has never used smokeless tobacco. He reports that he drinks about 0.6 oz of alcohol per week. He reports that he does not use illicit drugs.  Allergies:  Allergies  Allergen Reactions  . Fenofibrate Rash    Permament rash.     Medications Prior to Admission  Medication Sig Dispense Refill  . CINNAMON PO Take 1 capsule by mouth 2 (two) times daily.    Marland Kitchen glipiZIDE (GLUCOTROL XL) 10 MG 24 hr tablet Take 1 tablet (10 mg total) by mouth daily. 90 tablet 0  . JANUVIA 100 MG tablet Take 1 tablet (100 mg total) by mouth daily. 90 tablet 0  . lisinopril (PRINIVIL,ZESTRIL) 20 MG tablet Take 1 tablet (20 mg total) by mouth daily. 90 tablet 0  . metFORMIN (GLUCOPHAGE) 1000 MG tablet Take 1 tablet (1,000 mg total) by mouth 2 (two) times daily. 180 tablet  0  . omeprazole (PRILOSEC) 20 MG capsule Take 1 capsule (20 mg total) by mouth daily. 30 capsule 3  . Oxycodone HCl 10 MG TABS Take 1 tablet (10 mg total) by mouth 4 (four) times daily. (Patient taking differently: Take 10 mg by mouth 5 (five) times daily as needed (pain). ) 120 tablet 0  . simvastatin (ZOCOR) 20 MG tablet Take 1 tablet (20 mg total) by mouth daily at 6 PM. 90 tablet 0  . Blood Glucose Monitoring Suppl (ACCU-CHEK AVIVA) device Use as instructed 1 each 0  . glucose blood (ACCU-CHEK AVIVA) test strip Use as instructed 100 each 12  . Lancet Devices (ACCU-CHEK SOFTCLIX) lancets Use as instructed 1 each 0    Results for orders placed or performed during the hospital encounter of 04/04/15 (from the past 48 hour(s))  Glucose, capillary     Status: Abnormal   Collection Time: 04/04/15  7:31 AM  Result Value Ref Range   Glucose-Capillary 210 (H) 65 - 99 mg/dL   No results found.  ROS  Blood pressure 106/70, temperature 98 F (36.7 C), resp. rate 16, SpO2 95 %. Physical Exam  Constitutional: He appears well-developed and well-nourished.  HENT:  Mouth/Throat: Oropharynx is clear and moist.  Eyes: Conjunctivae are normal. No scleral icterus.  Neck: No thyromegaly present.  Cardiovascular: Normal rate and regular rhythm.   No murmur heard. Respiratory: Effort normal and breath sounds normal.  GI: Soft. He exhibits no distension and no mass. There is no tenderness.  Musculoskeletal: He exhibits no edema.  Lymphadenopathy:    He has no cervical adenopathy.  Neurological: He is alert.  Skin: Skin is warm and dry.  Geographic rash involving left forearm and to a lesser degree right arm.     Assessment/Plan GERD unresponsive to therapy. Diagnostic EGD.  Rogene Houston, MD 04/04/2015, 8:55 AM

## 2015-04-04 NOTE — Discharge Instructions (Signed)
Resume usual medications. Metoclopramide 10 mg by mouth 30 minutes before each meal. Stop this medication if he experienced side effects and call office. Clear liquids for 24 hours then gastroparesis diet(sheet). No driving for 24 hours. His incision will call with biopsy results. Office visit in 3-4 weeks. Office will call. Gastroparesis Gastroparesis, also called delayed gastric emptying, is a condition in which food takes longer than normal to empty from the stomach. The condition is usually long-lasting (chronic). CAUSES This condition may be caused by:  An endocrine disorder, such as hypothyroidism or diabetes. Diabetes is the most common cause of this condition.  A nervous system disease, such as Parkinson disease or multiple sclerosis.  Cancer, infection, or surgery of the stomach or vagus nerve.  A connective tissue disorder, such as scleroderma.  Certain medicines. In most cases, the cause is not known. RISK FACTORS This condition is more likely to develop in:  People with certain disorders, including endocrine disorders, eating disorders, amyloidosis, and scleroderma.  People with certain diseases, including Parkinson disease or multiple sclerosis.  People with cancer or infection of the stomach or vagus nerve.  People who have had surgery on the stomach or vagus nerve.  People who take certain medicines.  Women. SYMPTOMS Symptoms of this condition include:  An early feeling of fullness when eating.  Nausea.  Weight loss.  Vomiting.  Heartburn.  Abdominal bloating.  Inconsistent blood glucose levels.  Lack of appetite.  Acid from the stomach coming up into the esophagus (gastroesophageal reflux).  Spasms of the stomach. Symptoms may come and go. DIAGNOSIS This condition is diagnosed with tests, such as:  Tests that check how long it takes food to move through the stomach and intestines. These tests include:  Upper gastrointestinal (GI) series.  In this test, X-rays of the intestines are taken after you drink a liquid. The liquid makes the intestines show up better on the X-rays.  Gastric emptying scintigraphy. In this test, scans are taken after you eat food that contains a small amount of radioactive material.  Wireless capsule GI monitoring system. This test involves swallowing a capsule that records information about movement through the stomach.  Gastric manometry. This test measures electrical and muscular activity in the stomach. It is done with a thin tube that is passed down the throat and into the stomach.  Endoscopy. This test checks for abnormalities in the lining of the stomach. It is done with a long, thin tube that is passed down the throat and into the stomach.  An ultrasound. This test can help rule out gallbladder disease or pancreatitis as a cause of your symptoms. It uses sound waves to take pictures of the inside of your body. TREATMENT There is no cure for gastroparesis. This condition may be managed with:  Treatment of the underlying condition causing the gastroparesis.  Lifestyle changes, including exercise and dietary changes. Dietary changes can include:  Changes in what and when you eat.  Eating smaller meals more often.  Eating low-fat foods.  Eating low-fiber forms of high-fiber foods, such as cooked vegetables instead of raw vegetables.  Having liquid foods in place of solid foods. Liquid foods are easier to digest.  Medicines. These may be given to control nausea and vomiting and to stimulate stomach muscles.  Getting food through a feeding tube. This may be done in severe cases.  A gastric neurostimulator. This is a device that is inserted into the body with surgery. It helps improve stomach emptying and control nausea  and vomiting. HOME CARE INSTRUCTIONS  Follow your health care provider's instructions about exercise and diet.  Take medicines only as directed by your health care  provider. SEEK MEDICAL CARE IF:  Your symptoms do not improve with treatment.  You have new symptoms. SEEK IMMEDIATE MEDICAL CARE IF:  You have severe abdominal pain that does not improve with treatment.  You have nausea that does not go away.  You cannot keep fluids down.   This information is not intended to replace advice given to you by your health care provider. Make sure you discuss any questions you have with your health care provider.   Document Released: 12/21/2004 Document Revised: 05/07/2014 Document Reviewed: 12/17/2013 Elsevier Interactive Patient Education [employer].

## 2015-04-04 NOTE — Anesthesia Preprocedure Evaluation (Signed)
Anesthesia Evaluation  Patient identified by MRN, date of birth, ID band Patient awake    Reviewed: Allergy & Precautions, NPO status , Patient's Chart, lab work & pertinent test results  Airway Mallampati: II  TM Distance: >3 FB     Dental  (+) Teeth Intact   Pulmonary Current Smoker,    breath sounds clear to auscultation       Cardiovascular hypertension, Pt. on medications  Rhythm:Regular Rate:Normal     Neuro/Psych Chronic foot pain     GI/Hepatic GERD  Medicated,  Endo/Other  diabetes, Type 2  Renal/GU      Musculoskeletal   Abdominal   Peds  Hematology   Anesthesia Other Findings   Reproductive/Obstetrics                             Anesthesia Physical Anesthesia Plan  ASA: III  Anesthesia Plan: MAC   Post-op Pain Management:    Induction: Intravenous  Airway Management Planned: Simple Face Mask  Additional Equipment:   Intra-op Plan:   Post-operative Plan:   Informed Consent: I have reviewed the patients History and Physical, chart, labs and discussed the procedure including the risks, benefits and alternatives for the proposed anesthesia with the patient or authorized representative who has indicated his/her understanding and acceptance.     Plan Discussed with:   Anesthesia Plan Comments:         Anesthesia Quick Evaluation

## 2015-04-04 NOTE — Anesthesia Postprocedure Evaluation (Signed)
Anesthesia Post Note  Patient: Anquan Gorelick.  Procedure(s) Performed: Procedure(s) (LRB): ESOPHAGOGASTRODUODENOSCOPY (EGD) WITH PROPOFOL (N/A)  Patient location during evaluation: PACU Anesthesia Type: MAC Level of consciousness: awake and alert and oriented Pain management: pain level controlled Vital Signs Assessment: post-procedure vital signs reviewed and stable Respiratory status: spontaneous breathing and patient connected to nasal cannula oxygen Cardiovascular status: stable Postop Assessment: no signs of nausea or vomiting Anesthetic complications: no    Last Vitals:  Filed Vitals:   04/04/15 0850 04/04/15 0855  BP: 121/78 107/78  Temp:    Resp: 20 23    Last Pain:  Filed Vitals:   04/04/15 0857  PainSc: 0-No pain                 Lillien Petronio A

## 2015-04-07 NOTE — Op Note (Signed)
[facility] Patient Name: Timothy Nelson Procedure Date: 04/04/2015 9:01 AM MRN: [id number] Date of Birth: 03-25-1965 Attending MD: Hildred Laser , MD CSN: [id number] Age: 50 Admit Type: Outpatient Procedure:                Upper GI endoscopy Indications:              Heartburn, Failure to respond to medical treatment Providers:                Hildred Laser, MD, Gwenlyn Fudge, RN, Georgeann Oppenheim,                            Technician Referring MD:             Fransisca Kaufmann. Dettinger (Referring MD) Medicines:                Monitored Anesthesia Care Complications:            No immediate complications. Estimated Blood Loss:     Estimated blood loss was minimal. Procedure:                Pre-Anesthesia Assessment:                           - Prior to the procedure, a History and Physical                            was performed, and patient medications and                            allergies were reviewed. The patient's tolerance of                            previous anesthesia was also reviewed. The risks                            and benefits of the procedure and the sedation                            options and risks were discussed with the patient.                            All questions were answered, and informed consent                            was obtained. Prior Anticoagulants: The patient has                            taken no previous anticoagulant or antiplatelet                            agents. ASA Grade Assessment: II - A patient with                            mild systemic disease. After reviewing the risks  and benefits, the patient was deemed in                            satisfactory condition to undergo the procedure.                           After obtaining informed consent, the endoscope was                            passed under direct vision. Throughout the                            procedure, the patient's blood pressure,  pulse, and                            oxygen saturations were monitored continuously. The                            [id number]) was introduced through the and                            advanced to the second part of duodenum. The upper                            GI endoscopy was accomplished without difficulty.                            The patient tolerated the procedure well. Scope In: 9:08:13 AM Scope Out: 9:19:05 AM Total Procedure Duration: 0 hours 10 minutes 52 seconds  Findings:      The upper third of the esophagus was normal.      The upper third of the esophagus and middle third of the esophagus were       normal.      Localized mucosal changes were found in the lower third of the       esophagus. Biopsies were taken with a cold forceps for histology.      The Z-line was irregular and was found 42 cm from the incisors.      A large amount of food (residue) was found in the gastric fundus.      Patchy mildly erythematous mucosa without bleeding was found in the       gastric antrum. Biopsies were taken with a cold forceps for histology.      The duodenal bulb and second portion of the duodenum were normal. Impression:               - Normal upper third of esophagus.                           - Normal upper third of esophagus and middle third                            of esophagus.                           - Mucosal changes in the esophagus. Biopsied.                           -  Z-line irregular, 42 cm from the incisors.                           - A large amount of food (residue) in the stomach.                           - Erythematous mucosa in the antrum. Biopsied.                           - Normal duodenal bulb and second portion of the                            duodenum. Moderate Sedation:      Moderate (conscious) sedation was personally administered by an       anesthesia professional. The following parameters were monitored: oxygen       saturation, heart  rate, blood pressure, CO2 capnography and response to       care. Recommendation:           - Patient has a contact number available for                            emergencies. The signs and symptoms of potential                            delayed complications were discussed with the                            patient. Return to normal activities tomorrow.                            Written discharge instructions were provided to the                            patient.                           - Clear liquid diet for 1 day.                           - Continue present medications.                           - Await pathology results.                           - Use metoclopramide (Reglan) 10 mg PO before each                            meal today. Procedure Code(s):        --- Professional ---                           [phone], Esophagogastroduodenoscopy, flexible,                            transoral; with  biopsy, single or multiple Diagnosis Code(s):        --- Professional ---                           K22.8, Other specified diseases of esophagus                           K31.89, Other diseases of stomach and duodenum                           R12, Heartburn CPT copyright 2016 American [employer] Association. All rights reserved. The codes documented in this report are preliminary and upon coder review may  be revised to meet current compliance requirements. Hildred Laser, MD Hildred Laser, MD 04/07/2015 5:25:18 PM This report has been signed electronically. Number of Addenda: 0

## 2015-04-08 ENCOUNTER — Ambulatory Visit (INDEPENDENT_AMBULATORY_CARE_PROVIDER_SITE_OTHER): Payer: PPO | Admitting: Family Medicine

## 2015-04-08 ENCOUNTER — Encounter (INDEPENDENT_AMBULATORY_CARE_PROVIDER_SITE_OTHER): Payer: Self-pay | Admitting: *Deleted

## 2015-04-08 ENCOUNTER — Encounter: Payer: Self-pay | Admitting: Family Medicine

## 2015-04-08 VITALS — BP 140/89 | HR 89 | Temp 97.4°F | Ht 73.0 in | Wt 284.8 lb

## 2015-04-08 DIAGNOSIS — I10 Essential (primary) hypertension: Secondary | ICD-10-CM

## 2015-04-08 DIAGNOSIS — G8929 Other chronic pain: Secondary | ICD-10-CM | POA: Diagnosis not present

## 2015-04-08 DIAGNOSIS — M79671 Pain in right foot: Secondary | ICD-10-CM

## 2015-04-08 DIAGNOSIS — E785 Hyperlipidemia, unspecified: Secondary | ICD-10-CM | POA: Diagnosis not present

## 2015-04-08 DIAGNOSIS — E1165 Type 2 diabetes mellitus with hyperglycemia: Secondary | ICD-10-CM | POA: Diagnosis not present

## 2015-04-08 DIAGNOSIS — IMO0001 Reserved for inherently not codable concepts without codable children: Secondary | ICD-10-CM

## 2015-04-08 MED ORDER — METFORMIN HCL 1000 MG PO TABS: 1000.0000 mg | ORAL_TABLET | Freq: Two times a day (BID) | ORAL | Status: DC

## 2015-04-08 MED ORDER — GLIPIZIDE ER 10 MG PO TB24: 10.0000 mg | ORAL_TABLET | Freq: Every day | ORAL | Status: DC

## 2015-04-08 MED ORDER — TRIAMCINOLONE ACETONIDE 0.1 % EX CREA: 1.0000 "application " | TOPICAL_CREAM | Freq: Two times a day (BID) | CUTANEOUS | Status: DC

## 2015-04-08 MED ORDER — SIMVASTATIN 20 MG PO TABS: 20.0000 mg | ORAL_TABLET | Freq: Every day | ORAL | Status: DC

## 2015-04-08 MED ORDER — OXYCODONE HCL 10 MG PO TABS: 10.0000 mg | ORAL_TABLET | Freq: Four times a day (QID) | ORAL | Status: DC | PRN

## 2015-04-08 MED ORDER — OXYCODONE HCL 10 MG PO TABS: 10.0000 mg | ORAL_TABLET | Freq: Four times a day (QID) | ORAL | Status: DC

## 2015-04-08 MED ORDER — JANUVIA 100 MG PO TABS: 100.0000 mg | ORAL_TABLET | Freq: Every day | ORAL | Status: DC

## 2015-04-08 MED ORDER — LISINOPRIL 20 MG PO TABS: 20.0000 mg | ORAL_TABLET | Freq: Every day | ORAL | Status: DC

## 2015-04-09 ENCOUNTER — Encounter (HOSPITAL_COMMUNITY): Payer: Self-pay | Admitting: Internal Medicine

## 2015-04-09 LAB — MICROALBUMIN / CREATININE URINE RATIO
Creatinine, Urine: 140.5 mg/dL
MICROALB/CREAT RATIO: 17.8 mg/g creat (ref 0.0–30.0)
Microalbumin, Urine: 25 ug/mL

## 2015-04-14 ENCOUNTER — Other Ambulatory Visit: Payer: Self-pay | Admitting: Family Medicine

## 2015-04-14 NOTE — Progress Notes (Signed)
BP 140/89 mmHg  Pulse 89  Temp(Src) 97.4 F (36.3 C) (Oral)  Ht _0  (1.854 m)  Wt 284 lb 12.8 oz (129.184 kg)  BMI 37.58 kg/m2   Subjective:    Patient ID: Timothy Nelson., male    DOB: 05/06/1965, 50 y.o.   MRN: [id number]  HPI: Timothy Nelson. is a 50 y.o. male presenting on 04/08/2015 for Hyperlipidemia; Hypertension; and Diabetes   HPI Hypertension recheck. Patient is coming today for hypertension recheck. His blood pressure is 140/89. He is currently on lisinopril 20 mg. Patient denies headaches, blurred vision, chest pains, shortness of breath, or weakness. Denies any side effects from medication and is content with current medication.   Type 2 diabetes Patient comes in today for recheck on his diabetes. He is currently taking Januvia 100 daily and metformin 1000 mg twice a day ) glipizide 10 daily. he is currently on an ACE inhibitor and a statin.he is not currently taking a daily aspirin. He denies any new or changing issues with his feet.  He has not yet seen an ophthalmologist this year. He plans to get that done here soon.  Hyperlipidemia recheck Patient is coming in today for a cholesterol recheck. He is currently on Zocor 20 mg a denies any issues with that medication such as myalgias or liver problems.   Chronic foot pain Patient has deformed chronic foot pain because of a trauma when he was younger that caused both of his ankles to be everted slightly and significant swelling in both ankles. Because of this he has been on chronic pain meds and currently takes oxycodone 10 mg 4 times daily as needed.  Relevant past medical, surgical, family and social history reviewed and updated as indicated. Interim medical history since our last visit reviewed. Allergies and medications reviewed and updated.  Review of Systems  Constitutional: Negative for fever and chills.  HENT: Negative for ear discharge and ear pain.   Eyes: Negative for discharge and visual disturbance.    Respiratory: Negative for shortness of breath and wheezing.   Cardiovascular: Negative for chest pain and leg swelling.  Gastrointestinal: Negative for abdominal pain, diarrhea and constipation.  Genitourinary: Negative for difficulty urinating.  Musculoskeletal: Positive for joint swelling and arthralgias. Negative for myalgias, back pain and gait problem.  Skin: Negative for rash.  Neurological: Negative for dizziness, syncope, light-headedness and headaches.  All other systems reviewed and are negative.   Per HPI unless specifically indicated above     Medication List       This list is accurate as of: 04/08/15 11:59 PM.  Always use your most recent med list.               ACCU-CHEK AVIVA device  Use as instructed     accu-chek softclix lancets  Use as instructed     CINNAMON PO  Take 1 capsule by mouth 2 (two) times daily.     glipiZIDE 10 MG 24 hr tablet  Commonly known as:  GLUCOTROL XL  Take 1 tablet (10 mg total) by mouth daily.     glucose blood test strip  Commonly known as:  ACCU-CHEK AVIVA  Use as instructed     JANUVIA 100 MG tablet  Generic drug:  sitaGLIPtin  Take 1 tablet (100 mg total) by mouth daily.     lisinopril 20 MG tablet  Commonly known as:  PRINIVIL,ZESTRIL  Take 1 tablet (20 mg total) by mouth daily.     metFORMIN  1000 MG tablet  Commonly known as:  GLUCOPHAGE  Take 1 tablet (1,000 mg total) by mouth 2 (two) times daily.     metoCLOPramide 10 MG tablet  Commonly known as:  REGLAN  Take 1 tablet (10 mg total) by mouth 3 (three) times daily before meals.     omeprazole 20 MG capsule  Commonly known as:  PRILOSEC  Take 1 capsule (20 mg total) by mouth daily.     Oxycodone HCl 10 MG Tabs  Take 1 tablet (10 mg total) by mouth 4 (four) times daily.     Oxycodone HCl 10 MG Tabs  Take 1 tablet (10 mg total) by mouth 4 (four) times daily as needed.     Oxycodone HCl 10 MG Tabs  Take 1 tablet (10 mg total) by mouth 4 (four) times  daily as needed. Refill 1 month from prescription date     simvastatin 20 MG tablet  Commonly known as:  ZOCOR  Take 1 tablet (20 mg total) by mouth daily at 6 PM.     triamcinolone cream 0.1 %  Commonly known as:  KENALOG  Apply 1 application topically 2 (two) times daily.           Objective:    BP 140/89 mmHg  Pulse 89  Temp(Src) 97.4 F (36.3 C) (Oral)  Ht _0  (1.854 m)  Wt 284 lb 12.8 oz (129.184 kg)  BMI 37.58 kg/m2  Wt Readings from Last 3 Encounters:  04/08/15 284 lb 12.8 oz (129.184 kg)  04/03/15 286 lb (129.729 kg)  04/02/15 284 lb 1.6 oz (128.867 kg)    Physical Exam  Constitutional: He is oriented to person, place, and time. He appears well-developed and well-nourished. No distress.  Eyes: Conjunctivae and EOM are normal. Pupils are equal, round, and reactive to light. Right eye exhibits no discharge. No scleral icterus.  Neck: Neck supple. No thyromegaly present.  Cardiovascular: Normal rate, regular rhythm, normal heart sounds and intact distal pulses.   No murmur heard. Pulmonary/Chest: Effort normal and breath sounds normal. No respiratory distress. He has no wheezes.  Musculoskeletal: Normal range of motion. He exhibits tenderness (Bilateral ankle tenderness, none change from previous). He exhibits no edema.  Lymphadenopathy:    He has no cervical adenopathy.  Neurological: He is alert and oriented to person, place, and time. Coordination normal.  Skin: Skin is warm and dry. No rash noted. He is not diaphoretic.  Psychiatric: He has a normal mood and affect. His behavior is normal.  Vitals reviewed.      Assessment & Plan:   Problem List Items Addressed This Visit      Cardiovascular and Mediastinum   Essential hypertension, benign - Primary   Relevant Medications   lisinopril (PRINIVIL,ZESTRIL) 20 MG tablet   simvastatin (ZOCOR) 20 MG tablet   Other Relevant Orders   Microalbumin / creatinine urine ratio (Completed)   CMP14+EGFR   Lipid  panel     Endocrine   Diabetes type 2, uncontrolled (HCC)   Relevant Medications   glipiZIDE (GLUCOTROL XL) 10 MG 24 hr tablet   JANUVIA 100 MG tablet   lisinopril (PRINIVIL,ZESTRIL) 20 MG tablet   metFORMIN (GLUCOPHAGE) 1000 MG tablet   simvastatin (ZOCOR) 20 MG tablet   Other Relevant Orders   Microalbumin / creatinine urine ratio (Completed)   Bayer DCA Hb A1c Waived   CMP14+EGFR   Lipid panel     Other   Hyperlipidemia LDL goal <100   Relevant Medications  lisinopril (PRINIVIL,ZESTRIL) 20 MG tablet   simvastatin (ZOCOR) 20 MG tablet   Other Relevant Orders   Lipid panel   Chronic foot pain   Relevant Medications   Oxycodone HCl 10 MG TABS   Oxycodone HCl 10 MG TABS   Oxycodone HCl 10 MG TABS       Follow up plan: No Follow-up on file.  Counseling provided for all of the vaccine components Orders Placed This Encounter  Procedures  . Microalbumin / creatinine urine ratio  . Bayer DCA Hb A1c Waived  . CMP14+EGFR  . Lipid panel    Caryl Pina, MD [facility] 04/08/2015, 4:40 PM

## 2015-04-14 NOTE — Telephone Encounter (Signed)
Patient given results of urine test.

## 2015-05-09 ENCOUNTER — Ambulatory Visit: Payer: PPO | Admitting: Family Medicine

## 2015-05-13 ENCOUNTER — Encounter (INDEPENDENT_AMBULATORY_CARE_PROVIDER_SITE_OTHER): Payer: Self-pay | Admitting: Internal Medicine

## 2015-05-13 ENCOUNTER — Ambulatory Visit (INDEPENDENT_AMBULATORY_CARE_PROVIDER_SITE_OTHER): Payer: PPO | Admitting: Internal Medicine

## 2015-05-13 VITALS — BP 124/62 | HR 60 | Temp 97.9°F | Ht 73.0 in | Wt 278.6 lb

## 2015-05-13 DIAGNOSIS — K219 Gastro-esophageal reflux disease without esophagitis: Secondary | ICD-10-CM | POA: Diagnosis not present

## 2015-05-13 DIAGNOSIS — K2270 Barrett's esophagus without dysplasia: Secondary | ICD-10-CM | POA: Diagnosis not present

## 2015-05-13 NOTE — Patient Instructions (Signed)
Continue the Omeprazole and Reglan. OV in one year.

## 2015-05-13 NOTE — Progress Notes (Signed)
Subjective:    Patient ID: Timothy Roath., male    DOB: Mar 03, 1965, 50 y.o.   MRN: [id number]  HPI Here today for f/u after recent EGD for heart burn. He tells me he is doing good. He takes Reglan for gastroparesis. Appetite has remained good. He denies any heartburn. No side effects from the Reglan.  Diabetic since 2008.  04/04/2015 EGD: Dr. Laural Golden:  Indications: Heartburn, Failure to respond to medical treatment    The duodenal bulb and second portion of the duodenum were normal. Impression: - Normal upper third of esophagus.  - Normal upper third of esophagus and middle third   of esophagus.  - Mucosal changes in the esophagus. Biopsied.  - Z-line irregular, 42 cm from the incisors.  - A large amount of food (residue) in the stomach.  - Erythematous mucosa in the antrum. Biopsied.  - Normal duodenal bulb and second portion of the   Biopsy results reviewed with patient. Esophageal biopsy confirms Barrett's esophagus with reactive changes. Gastric biopsy negative for H. Pylori. Patient feels better and having no side effects with metoclopramide. Patient needs office visit in 4 weeks GERD and gastroparesis.    Review of Systems Past Medical History  Diagnosis Date  . Hyperlipidemia   . Essential hypertension   . Type 2 diabetes mellitus ([facility])   . Arthritis   . GERD (gastroesophageal reflux disease)   . Chronic pain     Past Surgical History  Procedure Laterality Date  . Fracture surgery  March 2007    Left arm, left thumb, bilateral legs  . Wrist fracture surgery    . Esophagogastroduodenoscopy (egd) with propofol N/A 04/04/2015    Procedure: ESOPHAGOGASTRODUODENOSCOPY (EGD) WITH PROPOFOL;  Surgeon: Rogene Houston, MD;  Location: [facility];  Service:  Endoscopy;  Laterality: N/A;  8:30    Allergies  Allergen Reactions  . Fenofibrate Rash    Permament rash.     Current Outpatient Prescriptions on File Prior to Visit  Medication Sig Dispense Refill  . Blood Glucose Monitoring Suppl (ACCU-CHEK AVIVA) device Use as instructed 1 each 0  . CINNAMON PO Take 1 capsule by mouth 2 (two) times daily.    Marland Kitchen glipiZIDE (GLUCOTROL XL) 10 MG 24 hr tablet Take 1 tablet (10 mg total) by mouth daily. 90 tablet 0  . glucose blood (ACCU-CHEK AVIVA) test strip Use as instructed 100 each 12  . JANUVIA 100 MG tablet Take 1 tablet (100 mg total) by mouth daily. 90 tablet 0  . Lancet Devices (ACCU-CHEK SOFTCLIX) lancets Use as instructed 1 each 0  . lisinopril (PRINIVIL,ZESTRIL) 20 MG tablet Take 1 tablet (20 mg total) by mouth daily. 90 tablet 0  . metFORMIN (GLUCOPHAGE) 1000 MG tablet Take 1 tablet (1,000 mg total) by mouth 2 (two) times daily. 180 tablet 0  . metoCLOPramide (REGLAN) 10 MG tablet Take 1 tablet (10 mg total) by mouth 3 (three) times daily before meals. 90 tablet 0  . omeprazole (PRILOSEC) 20 MG capsule Take 1 capsule (20 mg total) by mouth daily. (Patient taking differently: Take 20 mg by mouth 2 (two) times daily before a meal. ) 30 capsule 3  . Oxycodone HCl 10 MG TABS Take 1 tablet (10 mg total) by mouth 4 (four) times daily. 120 tablet 0  . simvastatin (ZOCOR) 20 MG tablet Take 1 tablet (20 mg total) by mouth daily at 6 PM. 90 tablet 0  . triamcinolone cream (KENALOG) 0.1 % Apply 1 application topically 2 (  two) times daily. 453.6 g 0   No current facility-administered medications on file prior to visit.        Objective:   Physical ExamBlood pressure 124/62, pulse 60, temperature 97.9 F (36.6 C), height 6\' 1"  (1.854 m), weight 278 lb 9.6 oz (126.372 kg).  Alert and oriented. Skin warm and dry. Oral mucosa is moist.   . Sclera anicteric, conjunctivae is pink. Thyroid not enlarged. No cervical lymphadenopathy. Lungs clear. Heart  regular rate and rhythm.  Abdomen is soft. Bowel sounds are positive. No hepatomegaly. No abdominal masses felt. No tenderness.  No edema to lower extremities.         Assessment & Plan:  Barrett's esophagus. GERD. Feels better since starting the Reglan and Omeprazole. OV in 1 year.

## 2015-05-14 ENCOUNTER — Ambulatory Visit: Payer: PPO | Admitting: Family Medicine

## 2015-05-19 ENCOUNTER — Other Ambulatory Visit: Payer: Self-pay | Admitting: Family Medicine

## 2015-05-22 ENCOUNTER — Other Ambulatory Visit: Payer: PPO

## 2015-05-22 DIAGNOSIS — IMO0001 Reserved for inherently not codable concepts without codable children: Secondary | ICD-10-CM

## 2015-05-22 DIAGNOSIS — I10 Essential (primary) hypertension: Secondary | ICD-10-CM

## 2015-05-22 DIAGNOSIS — E1165 Type 2 diabetes mellitus with hyperglycemia: Principal | ICD-10-CM

## 2015-05-22 DIAGNOSIS — E785 Hyperlipidemia, unspecified: Secondary | ICD-10-CM

## 2015-05-22 LAB — BAYER DCA HB A1C WAIVED: HB A1C (BAYER DCA - WAIVED): 8.1 % — ABNORMAL HIGH (ref <7.0)

## 2015-05-23 ENCOUNTER — Ambulatory Visit (INDEPENDENT_AMBULATORY_CARE_PROVIDER_SITE_OTHER): Payer: PPO | Admitting: Family Medicine

## 2015-05-23 ENCOUNTER — Encounter: Payer: Self-pay | Admitting: Family Medicine

## 2015-05-23 ENCOUNTER — Other Ambulatory Visit (INDEPENDENT_AMBULATORY_CARE_PROVIDER_SITE_OTHER): Payer: Self-pay | Admitting: Internal Medicine

## 2015-05-23 VITALS — BP 118/77 | HR 96 | Temp 97.0°F | Ht 73.0 in | Wt 276.4 lb

## 2015-05-23 DIAGNOSIS — F1120 Opioid dependence, uncomplicated: Secondary | ICD-10-CM

## 2015-05-23 DIAGNOSIS — M79671 Pain in right foot: Secondary | ICD-10-CM | POA: Diagnosis not present

## 2015-05-23 DIAGNOSIS — G8929 Other chronic pain: Secondary | ICD-10-CM

## 2015-05-23 DIAGNOSIS — Z79899 Other long term (current) drug therapy: Secondary | ICD-10-CM

## 2015-05-23 DIAGNOSIS — Z0289 Encounter for other administrative examinations: Secondary | ICD-10-CM

## 2015-05-23 LAB — LIPID PANEL
Chol/HDL Ratio: 4.4 ratio units (ref 0.0–5.0)
Cholesterol, Total: 157 mg/dL (ref 100–199)
HDL: 36 mg/dL — ABNORMAL LOW (ref >39)
LDL Calculated: 52 mg/dL (ref 0–99)
Triglycerides: 346 mg/dL — ABNORMAL HIGH (ref 0–149)
VLDL Cholesterol Cal: 69 mg/dL — ABNORMAL HIGH (ref 5–40)

## 2015-05-23 LAB — CMP14+EGFR
ALT: 49 IU/L — ABNORMAL HIGH (ref 0–44)
AST: 39 IU/L (ref 0–40)
Albumin/Globulin Ratio: 1.9 (ref 1.2–2.2)
Albumin: 4.4 g/dL (ref 3.5–5.5)
Alkaline Phosphatase: 102 IU/L (ref 39–117)
BUN/Creatinine Ratio: 16 (ref 9–20)
BUN: 15 mg/dL (ref 6–24)
Bilirubin Total: 0.2 mg/dL (ref 0.0–1.2)
CO2: 21 mmol/L (ref 18–29)
Calcium: 9.5 mg/dL (ref 8.7–10.2)
Chloride: 97 mmol/L (ref 96–106)
Creatinine, Ser: 0.91 mg/dL (ref 0.76–1.27)
GFR calc Af Amer: 114 mL/min/{1.73_m2} (ref >59)
GFR calc non Af Amer: 99 mL/min/{1.73_m2} (ref >59)
Globulin, Total: 2.3 g/dL (ref 1.5–4.5)
Glucose: 173 mg/dL — ABNORMAL HIGH (ref 65–99)
Potassium: 4.8 mmol/L (ref 3.5–5.2)
Sodium: 137 mmol/L (ref 134–144)
Total Protein: 6.7 g/dL (ref 6.0–8.5)

## 2015-05-23 MED ORDER — MORPHINE SULFATE ER 30 MG PO TBCR: 30.0000 mg | EXTENDED_RELEASE_TABLET | Freq: Two times a day (BID) | ORAL | Status: DC

## 2015-05-23 MED ORDER — LIRAGLUTIDE 18 MG/3ML ~~LOC~~ SOPN: 1.2000 mg | PEN_INJECTOR | Freq: Every day | SUBCUTANEOUS | Status: DC

## 2015-05-23 NOTE — Addendum Note (Signed)
Addended by: Michaela Corner on: 05/23/2015 01:28 PM   Modules accepted: Orders

## 2015-05-23 NOTE — Progress Notes (Signed)
[place] Controlled Substance Abuse database reviewed- Yes  Depression screen [facility] 2/9 05/23/2015 04/08/2015 01/09/2015 12/12/2014 10/09/2014  Decreased Interest 0 0 1 0 0  Down, Depressed, Hopeless 1 0 0 0 0  PHQ - 2 Score 1 0 1 0 0    No flowsheet data found.     [facility] drug screen performed- Yes  SOAPP  0= never  1= seldom  2=sometimes  3= often  4= very often  How often do you have mood swings? 1 How often do you smoke a cigarette within an hour after waling up? 4 How often have you taken medication other than the way that it was prescribed?0 How often have you used illegal drugs in the past 5 years? 1 How often, in your lifetime, have you had legal problems or been arrested? 0  Score 6  Alcohol Audit - How often during the last year have found that you: 0-Never   1- Less than monthly   2- Monthly     3-Weekly     4-daily or almost daily  - found that you were not able to stop drinking once you started- 0 -failed to do what was normally expected of you because of drinking- 0 -needed a first drink in the morning- 0 -had a feeling of guilt or remorse after drinking- 0 -are/were unable to remember what happened the night before because of your drinking- 0  0- NO   2- yes but not in last year  4- yes during last year -Have you or someone else been injured because of your drinking- 0 - Has anyone been concerned about your drinking or suggested you cut down- 0        TOTAL- 0  ( 0-7- alcohol education, 8-15- simple advice, 16-19 simple advice plus counseling, 20-40 referral for evaluation and treatment 0   Designated Pharmacy- [facility], [place]  Pain assessment: Cause of pain- ankle Pain location- ankle Pain on scale of 1-10- 8 Frequency- daily most of the day What increases pain-walking and being on his feet What makes pain Better-medication, rest  Effects on ADL - doesn't stop him, but pain limits prolonged periods on feet  Prior treatments tried and failed-  surgery, see podiatrist Current treatments- sees podiatrist and pain meds Morphine mg equivalent- 60  Pain management agreement reviewed and signed- Yes

## 2015-05-23 NOTE — Telephone Encounter (Signed)
Patient will need a OV wit Terri Setzer,Np prior to further refills,per Dr.Rehman.

## 2015-05-26 ENCOUNTER — Telehealth: Payer: Self-pay | Admitting: Family Medicine

## 2015-05-26 NOTE — Telephone Encounter (Signed)
Patient aware of lab results.

## 2015-05-28 ENCOUNTER — Encounter (INDEPENDENT_AMBULATORY_CARE_PROVIDER_SITE_OTHER): Payer: Self-pay | Admitting: Internal Medicine

## 2015-05-28 ENCOUNTER — Other Ambulatory Visit: Payer: Self-pay

## 2015-05-28 ENCOUNTER — Telehealth: Payer: Self-pay | Admitting: Family Medicine

## 2015-05-28 MED ORDER — PEN NEEDLES 31G X 8 MM MISC: Status: DC

## 2015-05-28 MED ORDER — LIRAGLUTIDE 18 MG/3ML ~~LOC~~ SOPN: 1.2000 mg | PEN_INJECTOR | Freq: Every day | SUBCUTANEOUS | Status: DC

## 2015-05-28 NOTE — Telephone Encounter (Signed)
Letter sent to the patient advising him that he will need to call and schedule an appointment with Deberah Castle, NP per Dr. Laural Golden prior to anymore refills.

## 2015-05-28 NOTE — Telephone Encounter (Signed)
rx sent in 

## 2015-06-03 LAB — TOXASSURE SELECT 13 (MW), URINE

## 2015-06-23 ENCOUNTER — Ambulatory Visit (INDEPENDENT_AMBULATORY_CARE_PROVIDER_SITE_OTHER): Payer: PPO | Admitting: Family Medicine

## 2015-06-23 ENCOUNTER — Ambulatory Visit: Payer: PPO | Admitting: Family Medicine

## 2015-06-23 ENCOUNTER — Encounter: Payer: Self-pay | Admitting: Family Medicine

## 2015-06-23 VITALS — BP 139/81 | HR 88 | Temp 97.6°F | Ht 73.0 in | Wt 275.8 lb

## 2015-06-23 DIAGNOSIS — G8929 Other chronic pain: Secondary | ICD-10-CM | POA: Diagnosis not present

## 2015-06-23 DIAGNOSIS — F1410 Cocaine abuse, uncomplicated: Secondary | ICD-10-CM

## 2015-06-23 DIAGNOSIS — F1490 Cocaine use, unspecified, uncomplicated: Secondary | ICD-10-CM

## 2015-06-23 DIAGNOSIS — M79671 Pain in right foot: Secondary | ICD-10-CM

## 2015-06-23 NOTE — Progress Notes (Signed)
Lab

## 2015-06-23 NOTE — Progress Notes (Signed)
BP 139/81 mmHg  Pulse 88  Temp(Src) 97.6 F (36.4 C) (Oral)  Ht 6\' 1"  (1.854 m)  Wt 275 lb 12.8 oz (125.102 kg)  BMI 36.40 kg/m2   Subjective:    Patient ID: Timothy Bouche., male    DOB: 07/06/1965, 50 y.o.   MRN: [id number]  HPI: Timothy Younkins. is a 50 y.o. male presenting on 06/23/2015 for Pain management followup   HPI Patient is coming in today for a pain management follow-up. On his urine drug screen on his last visit came back positive for cocaine metabolite. Patient denies use of cocaine. We discussed the issues with using drugs such as this. He does admit that he has used marijuana previously but has not used cocaine. He is currently taking MS Contin but he says that he does not feel like the MS Contin is working as well and would like to go back to his oxycodone. He does say that he still has a prescription refill for the oxycodone and will just go back to that. I told him that we would be going on monthly checks now until we establish again that everything was negative. He is agreeable towards this.  Relevant past medical, surgical, family and social history reviewed and updated as indicated. Interim medical history since our last visit reviewed. Allergies and medications reviewed and updated.  Review of Systems  Constitutional: Negative for fever.  HENT: Negative for ear discharge and ear pain.   Eyes: Negative for discharge and visual disturbance.  Respiratory: Negative for shortness of breath and wheezing.   Cardiovascular: Negative for chest pain and leg swelling.  Gastrointestinal: Negative for abdominal pain, diarrhea and constipation.  Genitourinary: Negative for difficulty urinating.  Musculoskeletal: Positive for arthralgias (Bilateral ankle joint pains from fractures and deformity). Negative for back pain and gait problem.  Skin: Negative for rash.  Neurological: Negative for syncope, light-headedness and headaches.  All other systems reviewed and are  negative.   Per HPI unless specifically indicated above     Medication List       This list is accurate as of: 06/23/15  1:15 PM.  Always use your most recent med list.               ACCU-CHEK AVIVA device  Use as instructed     accu-chek softclix lancets  Use as instructed     CINNAMON PO  Take 1 capsule by mouth 2 (two) times daily.     glipiZIDE 10 MG 24 hr tablet  Commonly known as:  GLUCOTROL XL  TAKE 1 TABLET (10 MG TOTAL) BY MOUTH DAILY.     glucose blood test strip  Commonly known as:  ACCU-CHEK AVIVA  Use as instructed     Liraglutide 18 MG/3ML Sopn  Inject 0.2 mLs (1.2 mg total) into the skin at bedtime.     lisinopril 20 MG tablet  Commonly known as:  PRINIVIL,ZESTRIL  TAKE 1 TABLET (20 MG TOTAL) BY MOUTH DAILY.     metFORMIN 1000 MG tablet  Commonly known as:  GLUCOPHAGE  TAKE 1 TABLET (1,000 MG TOTAL) BY MOUTH 2 (TWO) TIMES DAILY.     metoCLOPramide 10 MG tablet  Commonly known as:  REGLAN  TAKE 1 TABLET (10 MG TOTAL) BY MOUTH 3 (THREE) TIMES DAILY BEFORE MEALS.     morphine 30 MG 12 hr tablet  Commonly known as:  MS CONTIN  Take 1 tablet (30 mg total) by mouth every 12 (twelve) hours.  omeprazole 20 MG capsule  Commonly known as:  PRILOSEC  TAKE 1 CAPSULE (20 MG TOTAL) BY MOUTH DAILY.     Oxycodone HCl 10 MG Tabs  Take 1 tablet (10 mg total) by mouth 4 (four) times daily.     Pen Needles 31G X 8 MM Misc  Use to inject Victoza daily     simvastatin 20 MG tablet  Commonly known as:  ZOCOR  Take 1 tablet (20 mg total) by mouth daily at 6 PM.     triamcinolone cream 0.1 %  Commonly known as:  KENALOG  Apply 1 application topically 2 (two) times daily.           Objective:    BP 139/81 mmHg  Pulse 88  Temp(Src) 97.6 F (36.4 C) (Oral)  Ht 6\' 1"  (1.854 m)  Wt 275 lb 12.8 oz (125.102 kg)  BMI 36.40 kg/m2  Wt Readings from Last 3 Encounters:  06/23/15 275 lb 12.8 oz (125.102 kg)  05/23/15 276 lb 6.4 oz (125.374 kg)    05/13/15 278 lb 9.6 oz (126.372 kg)    Physical Exam  Constitutional: He is oriented to person, place, and time. He appears well-developed and well-nourished. No distress.  Eyes: Conjunctivae and EOM are normal. Pupils are equal, round, and reactive to light. Right eye exhibits no discharge. No scleral icterus.  Neck: Neck supple. No thyromegaly present.  Cardiovascular: Normal rate, regular rhythm, normal heart sounds and intact distal pulses.   No murmur heard. Pulmonary/Chest: Effort normal and breath sounds normal. No respiratory distress. He has no wheezes.  Musculoskeletal: Normal range of motion. He exhibits no edema.  Lymphadenopathy:    He has no cervical adenopathy.  Neurological: He is alert and oriented to person, place, and time. Coordination normal.  Skin: Skin is warm and dry. No rash noted. He is not diaphoretic.  Psychiatric: He has a normal mood and affect. His behavior is normal.  Nursing note and vitals reviewed.   Results for orders placed or performed in visit on 05/23/15  ToxASSURE Select 13 (MW), Urine  Result Value Ref Range   ToxAssure Select 13 FINAL       Assessment & Plan:   Problem List Items Addressed This Visit      Other   Chronic foot pain - Primary    Other Visit Diagnoses    Cocaine use           Patient will go back on the oxycodone 10 mg 4 times a day and we will see him monthly with regular urine drug screens.  Follow up plan: Return in about 4 weeks (around 07/21/2015), or if symptoms worsen or fail to improve, for Recheck pain and urinary drug screen.  Counseling provided for all of the vaccine components No orders of the defined types were placed in this encounter.    Caryl Pina, MD [facility] 06/23/2015, 1:15 PM

## 2015-06-23 NOTE — Patient Instructions (Signed)
Lab

## 2015-07-21 ENCOUNTER — Encounter: Payer: Self-pay | Admitting: Family Medicine

## 2015-07-21 ENCOUNTER — Ambulatory Visit (INDEPENDENT_AMBULATORY_CARE_PROVIDER_SITE_OTHER): Payer: PPO | Admitting: Family Medicine

## 2015-07-21 VITALS — BP 129/86 | HR 78 | Temp 97.7°F | Ht 73.0 in | Wt 274.6 lb

## 2015-07-21 DIAGNOSIS — M79671 Pain in right foot: Secondary | ICD-10-CM | POA: Diagnosis not present

## 2015-07-21 DIAGNOSIS — G8929 Other chronic pain: Secondary | ICD-10-CM

## 2015-07-21 MED ORDER — OXYCODONE HCL 10 MG PO TABS: 10.0000 mg | ORAL_TABLET | Freq: Four times a day (QID) | ORAL | Status: DC

## 2015-07-21 NOTE — Progress Notes (Signed)
BP 129/86 mmHg  Pulse 78  Temp(Src) 97.7 F (36.5 C) (Oral)  Ht 6\' 1"  (1.854 m)  Wt 274 lb 9.6 oz (124.558 kg)  BMI 36.24 kg/m2   Subjective:    Patient ID: Timothy Bouche., male    DOB: [birth year], 50 y.o.   MRN: [id number]  HPI: Timothy Servellon. is a 50 y.o. male presenting on 07/21/2015 for Medication Refill   HPI Chronic pain recheck Patient comes in today for chronic pain recheck. HeHad some metabolites and his drug screen of cocaine last time so he is coming in monthly as promised that he will not show positive again and does not know why it showed a positive last time. He says his pain medication is doing well and he gave urine drug screen today and we checked the [place] controlled substance database and he has been consistent in his refills. He still sees a podiatrist every month to help manage this  Relevant past medical, surgical, family and social history reviewed and updated as indicated. Interim medical history since our last visit reviewed. Allergies and medications reviewed and updated.  Review of Systems  Constitutional: Negative for fever.  HENT: Negative for ear discharge and ear pain.   Eyes: Negative for discharge and visual disturbance.  Respiratory: Negative for shortness of breath and wheezing.   Cardiovascular: Negative for chest pain and leg swelling.  Gastrointestinal: Negative for abdominal pain, diarrhea and constipation.  Genitourinary: Negative for difficulty urinating.  Musculoskeletal: Positive for joint swelling and arthralgias. Negative for back pain and gait problem.  Skin: Negative for rash.  Neurological: Negative for syncope, light-headedness and headaches.  All other systems reviewed and are negative.   Per HPI unless specifically indicated above     Medication List       This list is accurate as of: 07/21/15  8:37 AM.  Always use your most recent med list.               ACCU-CHEK AVIVA device  Use as instructed     accu-chek softclix lancets  Use as instructed     CINNAMON PO  Take 1 capsule by mouth 2 (two) times daily.     glipiZIDE 10 MG 24 hr tablet  Commonly known as:  GLUCOTROL XL  TAKE 1 TABLET (10 MG TOTAL) BY MOUTH DAILY.     glucose blood test strip  Commonly known as:  ACCU-CHEK AVIVA  Use as instructed     Liraglutide 18 MG/3ML Sopn  Inject 0.2 mLs (1.2 mg total) into the skin at bedtime.     lisinopril 20 MG tablet  Commonly known as:  PRINIVIL,ZESTRIL  TAKE 1 TABLET (20 MG TOTAL) BY MOUTH DAILY.     metFORMIN 1000 MG tablet  Commonly known as:  GLUCOPHAGE  TAKE 1 TABLET (1,000 MG TOTAL) BY MOUTH 2 (TWO) TIMES DAILY.     metoCLOPramide 10 MG tablet  Commonly known as:  REGLAN  TAKE 1 TABLET (10 MG TOTAL) BY MOUTH 3 (THREE) TIMES DAILY BEFORE MEALS.     Oxycodone HCl 10 MG Tabs  Take 1 tablet (10 mg total) by mouth 4 (four) times daily.     Pen Needles 31G X 8 MM Misc  Use to inject Victoza daily     simvastatin 20 MG tablet  Commonly known as:  ZOCOR  Take 1 tablet (20 mg total) by mouth daily at 6 PM.     triamcinolone cream 0.1 %  Commonly known as:  KENALOG  Apply 1 application topically 2 (two) times daily.           Objective:    BP 129/86 mmHg  Pulse 78  Temp(Src) 97.7 F (36.5 C) (Oral)  Ht 6\' 1"  (1.854 m)  Wt 274 lb 9.6 oz (124.558 kg)  BMI 36.24 kg/m2  Wt Readings from Last 3 Encounters:  07/21/15 274 lb 9.6 oz (124.558 kg)  06/23/15 275 lb 12.8 oz (125.102 kg)  05/23/15 276 lb 6.4 oz (125.374 kg)    Physical Exam  Constitutional: He is oriented to person, place, and time. He appears well-developed and well-nourished. No distress.  Eyes: Conjunctivae and EOM are normal. Pupils are equal, round, and reactive to light. Right eye exhibits no discharge. No scleral icterus.  Neck: Neck supple. No thyromegaly present.  Cardiovascular: Normal rate, regular rhythm, normal heart sounds and intact distal pulses.   No murmur heard. Pulmonary/Chest:  Effort normal and breath sounds normal. No respiratory distress. He has no wheezes.  Musculoskeletal: Normal range of motion. He exhibits edema and tenderness (Bilateral ankle pain worse on right than left with swelling and deformity).  Lymphadenopathy:    He has no cervical adenopathy.  Neurological: He is alert and oriented to person, place, and time. Coordination normal.  Skin: Skin is warm and dry. No rash noted. He is not diaphoretic.  Psychiatric: He has a normal mood and affect. His behavior is normal.  Nursing note and vitals reviewed.   Results for orders placed or performed in visit on 05/23/15  ToxASSURE Select 13 (MW), Urine  Result Value Ref Range   ToxAssure Select 13 FINAL       Assessment & Plan:   Problem List Items Addressed This Visit      Other   Chronic foot pain - Primary   Relevant Medications   Oxycodone HCl 10 MG TABS   Other Relevant Orders   ToxASSURE Select 13 (MW), Urine       Follow up plan: Return in about 4 weeks (around 08/18/2015), or if symptoms worsen or fail to improve, for dm recheck, pain recheck.  Counseling provided for all of the vaccine components Orders Placed This Encounter  Procedures  . ToxASSURE Select 13 (MW), Urine    Caryl Pina, MD [facility] 07/21/2015, 8:37 AM

## 2015-07-29 LAB — TOXASSURE SELECT 13 (MW), URINE: PDF: 0

## 2015-08-11 ENCOUNTER — Ambulatory Visit (INDEPENDENT_AMBULATORY_CARE_PROVIDER_SITE_OTHER): Payer: PPO | Admitting: Podiatry

## 2015-08-11 ENCOUNTER — Encounter: Payer: Self-pay | Admitting: Podiatry

## 2015-08-11 VITALS — BP 159/86 | HR 83 | Resp 18

## 2015-08-11 DIAGNOSIS — Q828 Other specified congenital malformations of skin: Secondary | ICD-10-CM | POA: Diagnosis not present

## 2015-08-11 DIAGNOSIS — E1149 Type 2 diabetes mellitus with other diabetic neurological complication: Secondary | ICD-10-CM | POA: Diagnosis not present

## 2015-08-11 DIAGNOSIS — M79673 Pain in unspecified foot: Secondary | ICD-10-CM

## 2015-08-11 NOTE — Progress Notes (Signed)
   Subjective:    Patient ID: Timothy Nelson., male    DOB: 1965/02/26, 50 y.o.   MRN: [id number]  HPI  50 year old male presents the office if or concerns of painful calluses to both of his feet he points the submetatarsal 5 area. He has a history fracture the right ankle is noted to the ankle. He periodically gets the calluses trimmed up in 4 weeks. He wishes to hold off any kind of surgery or any type of diabetic shoes or inserts as he is tried this does not help. Denies he swelling to his feet or any redness or drainage along the callus size. No other complaints at this time.  Review of Systems  All other systems reviewed and are negative.      Objective:   Physical Exam General: AAO x3, NAD  Dermatological: Scars from prior surgery are well-healed. Hyperkeratotic lesions present submetatarsal 5 bilaterally. These appear to be punctate annular hyperkeratotic lesions. No underlying ulceration, drainage or other signs of infection.  Vascular: Dorsalis Pedis artery and Posterior Tibial artery pedal pulses are 2/4 bilateral with immedate capillary fill time. Chronic swelling right ankle There is no pain with calf compression, swelling, warmth, erythema.   Neruologic: Sensation decreased on the right side with Derrel Nip monofilament.   Musculoskeletal: Tenderness to metatarsal 5 hyperkeratotic lesions. No other areas of tenderness bilaterally. No gross boney pedal deformities bilateral. No pain, crepitus, or limitation noted with foot and ankle range of motion bilateral. Muscular strength 5/5 in all groups tested bilateral.  Gait: Unassisted, Nonantalgic.      Assessment & Plan:  50 year old male some metatarsal 5 hyperkeratotic lesions, porokeratosis -Treatment options discussed including all alternatives, risks, and complications -Etiology of symptoms were discussed -Hyperkeratotic lesions debrided 2 without couple complications or bleeding. Dispensed offloading  pads. -Follow-up in 4 weeks at his request for callus terms. Follow up sooner if any issues are to arise. Call any questions concerns and meantime.  Celesta Gentile, DPM

## 2015-08-11 NOTE — Progress Notes (Signed)
   Subjective:    Patient ID: Timothy Tam., male    DOB: 02/05/1965, 50 y.o.   MRN: [id number]  HPI I have a callus on     Review of Systems     Objective:   Physical Exam        Assessment & Plan:

## 2015-08-18 ENCOUNTER — Encounter: Payer: Self-pay | Admitting: Family Medicine

## 2015-08-18 ENCOUNTER — Ambulatory Visit (INDEPENDENT_AMBULATORY_CARE_PROVIDER_SITE_OTHER): Payer: PPO | Admitting: Family Medicine

## 2015-08-18 VITALS — BP 125/85 | HR 85 | Temp 97.5°F | Ht 73.0 in | Wt 275.2 lb

## 2015-08-18 DIAGNOSIS — I10 Essential (primary) hypertension: Secondary | ICD-10-CM

## 2015-08-18 DIAGNOSIS — E785 Hyperlipidemia, unspecified: Secondary | ICD-10-CM | POA: Diagnosis not present

## 2015-08-18 DIAGNOSIS — E1165 Type 2 diabetes mellitus with hyperglycemia: Secondary | ICD-10-CM

## 2015-08-18 DIAGNOSIS — G8929 Other chronic pain: Secondary | ICD-10-CM

## 2015-08-18 DIAGNOSIS — IMO0001 Reserved for inherently not codable concepts without codable children: Secondary | ICD-10-CM

## 2015-08-18 DIAGNOSIS — M79671 Pain in right foot: Secondary | ICD-10-CM

## 2015-08-18 LAB — BAYER DCA HB A1C WAIVED: HB A1C (BAYER DCA - WAIVED): 8.2 % — ABNORMAL HIGH (ref <7.0)

## 2015-08-18 MED ORDER — OXYCODONE HCL 10 MG PO TABS: 10.0000 mg | ORAL_TABLET | Freq: Four times a day (QID) | ORAL | 0 refills | Status: DC

## 2015-08-18 MED ORDER — EMPAGLIFLOZIN 10 MG PO TABS: 10.0000 mg | ORAL_TABLET | Freq: Every day | ORAL | 3 refills | Status: DC

## 2015-08-18 NOTE — Progress Notes (Signed)
BP 125/85 (BP Location: Left Arm, Patient Position: Sitting, Cuff Size: Large)   Pulse 85   Temp 97.5 F (36.4 C) (Oral)   Ht 6\' 1"  (1.854 m)   Wt 275 lb 3.2 oz (124.8 kg)   BMI 36.31 kg/m    Subjective:    Patient ID: Timothy Bouche., male    DOB: [birth year], 50 y.o.   MRN: [id number]  HPI: Timothy Holms. is a 50 y.o. male presenting on 08/18/2015 for Pain management (followup) and Diabetes (followup, patient reports he has had no energy recently )   HPI Hypertension recheck Patient comes in today for a hypertension recheck. His blood pressure today is 125/85. He is currently on lisinopril.Patient denies headaches, blurred vision, chest pains, shortness of breath, or weakness. Denies any side effects from medication and is content with current medication.   Type 2 diabetes  Patient is currently on glipizide and Victoza and metformin for his diabetes. He does not know the Victoza is helped him much more with his sugars than the Januvia was and has cost him more. He says his a.m. blood sugars are typically running in the 140s to 150s. He still gets some p.m. blood sugars up over 200.  Hyperlipidemia recheck Patient is currently on simvastatin 20 mg and denies any issues with medication such as myalgias.  Chronic foot pain Patient is coming in today for refill on his medication for his chronic foot pain. He denies any major issues with it. His medications have been controlling his pain.  Relevant past medical, surgical, family and social history reviewed and updated as indicated. Interim medical history since our last visit reviewed. Allergies and medications reviewed and updated.  Review of Systems  Constitutional: Negative for chills and fever.  HENT: Negative for ear discharge and ear pain.   Eyes: Negative for discharge and visual disturbance.  Respiratory: Negative for shortness of breath and wheezing.   Cardiovascular: Negative for chest pain and leg swelling.    Gastrointestinal: Negative for abdominal pain, constipation and diarrhea.  Genitourinary: Negative for difficulty urinating.  Musculoskeletal: Positive for arthralgias. Negative for back pain and gait problem.  Skin: Negative for rash.  Neurological: Negative for syncope, light-headedness and headaches.  All other systems reviewed and are negative.   Per HPI unless specifically indicated above     Medication List       Accurate as of 08/18/15  9:15 AM. Always use your most recent med list.          ACCU-CHEK AVIVA device Use as instructed   accu-chek softclix lancets Use as instructed   CINNAMON PO Take 1 capsule by mouth 2 (two) times daily.   glipiZIDE 10 MG 24 hr tablet Commonly known as:  GLUCOTROL XL TAKE 1 TABLET (10 MG TOTAL) BY MOUTH DAILY.   glucose blood test strip Commonly known as:  ACCU-CHEK AVIVA Use as instructed   Liraglutide 18 MG/3ML Sopn Inject 0.2 mLs (1.2 mg total) into the skin at bedtime.   lisinopril 20 MG tablet Commonly known as:  PRINIVIL,ZESTRIL TAKE 1 TABLET (20 MG TOTAL) BY MOUTH DAILY.   metFORMIN 1000 MG tablet Commonly known as:  GLUCOPHAGE TAKE 1 TABLET (1,000 MG TOTAL) BY MOUTH 2 (TWO) TIMES DAILY.   metoCLOPramide 10 MG tablet Commonly known as:  REGLAN TAKE 1 TABLET (10 MG TOTAL) BY MOUTH 3 (THREE) TIMES DAILY BEFORE MEALS.   Oxycodone HCl 10 MG Tabs Take 1 tablet (10 mg total) by mouth 4 (four) times  daily.   Pen Needles 31G X 8 MM Misc Use to inject Victoza daily   simvastatin 20 MG tablet Commonly known as:  ZOCOR Take 1 tablet (20 mg total) by mouth daily at 6 PM.   triamcinolone cream 0.1 % Commonly known as:  KENALOG Apply 1 application topically 2 (two) times daily.          Objective:    BP 125/85 (BP Location: Left Arm, Patient Position: Sitting, Cuff Size: Large)   Pulse 85   Temp 97.5 F (36.4 C) (Oral)   Ht 6\' 1"  (1.854 m)   Wt 275 lb 3.2 oz (124.8 kg)   BMI 36.31 kg/m   Wt Readings from  Last 3 Encounters:  08/18/15 275 lb 3.2 oz (124.8 kg)  07/21/15 274 lb 9.6 oz (124.6 kg)  06/23/15 275 lb 12.8 oz (125.1 kg)    Physical Exam  Constitutional: He is oriented to person, place, and time. He appears well-developed and well-nourished. No distress.  Eyes: Conjunctivae and EOM are normal. Pupils are equal, round, and reactive to light. Right eye exhibits no discharge. No scleral icterus.  Neck: Neck supple. No thyromegaly present.  Cardiovascular: Normal rate, regular rhythm, normal heart sounds and intact distal pulses.   No murmur heard. Pulmonary/Chest: Effort normal and breath sounds normal. No respiratory distress. He has no wheezes.  Musculoskeletal: Normal range of motion. He exhibits tenderness (Bilateral ankle tenderness and deformity and chronic) and deformity. He exhibits no edema.  Lymphadenopathy:    He has no cervical adenopathy.  Neurological: He is alert and oriented to person, place, and time. Coordination normal.  Skin: Skin is warm and dry. No rash noted. He is not diaphoretic.  Psychiatric: He has a normal mood and affect. His behavior is normal.  Nursing note and vitals reviewed.     Assessment & Plan:   Problem List Items Addressed This Visit      Cardiovascular and Mediastinum   Essential hypertension, benign - Primary     Endocrine   Diabetes type 2, uncontrolled (HCC)   Relevant Medications   empagliflozin (JARDIANCE) 10 MG TABS tablet   Other Relevant Orders   Bayer DCA Hb A1c Waived (Completed)     Other   Hyperlipidemia LDL goal <100   Chronic foot pain   Relevant Medications   Oxycodone HCl 10 MG TABS   Other Relevant Orders   ToxASSURE Select 13 (MW), Urine    Other Visit Diagnoses   None.      Follow up plan: Return in about 4 weeks (around 09/15/2015), or if symptoms worsen or fail to improve, for Chronic foot pain recheck.  Counseling provided for all of the vaccine components Orders Placed This Encounter  Procedures  .  Bayer DCA Hb A1c Waived  . ToxASSURE Select 13 (MW), Urine    Caryl Pina, MD [facility] 08/18/2015, 9:15 AM

## 2015-08-23 ENCOUNTER — Other Ambulatory Visit: Payer: Self-pay | Admitting: Family Medicine

## 2015-08-25 LAB — TOXASSURE SELECT 13 (MW), URINE: PDF: 0

## 2015-09-14 ENCOUNTER — Other Ambulatory Visit: Payer: Self-pay | Admitting: Family Medicine

## 2015-09-15 ENCOUNTER — Encounter: Payer: Self-pay | Admitting: Podiatry

## 2015-09-15 ENCOUNTER — Ambulatory Visit (INDEPENDENT_AMBULATORY_CARE_PROVIDER_SITE_OTHER): Payer: PPO | Admitting: Podiatry

## 2015-09-15 DIAGNOSIS — Q828 Other specified congenital malformations of skin: Secondary | ICD-10-CM

## 2015-09-15 NOTE — Progress Notes (Signed)
Subjective: Patient presents today for continued care of painful calluses to both of his feet. He states that he had relief after last appointment but they come back and become painful. Denies any redness or drainage. Denies any systemic complaints such as fevers, chills, nausea, vomiting. No acute changes since last appointment, and no other complaints at this time.   Objective: AAO x3, NAD DP/PT pulses palpable bilaterally, CRT less than 3 seconds Bilateral submetatarsal 5 hyperkeratotic lesion. Upon debridement no underlying ulceration, drainage or any signs of infection. No edema, erythema, increase in warmth to bilateral lower extremities.  No open lesions or pre-ulcerative lesions.  No pain with calf compression, swelling, warmth, erythema  Assessment: Porokeratosis bilateral subtarsal 5  Plan: -All treatment options discussed with the patient including all alternatives, risks, complications.  -Lesions debrided 2 without couple complications or bleeding -Follow-up in 4 weeks at his request.  -Patient encouraged to call the office with any questions, concerns, change in symptoms.   Celesta Gentile, DPM

## 2015-09-17 ENCOUNTER — Ambulatory Visit: Payer: PPO | Admitting: Family Medicine

## 2015-09-18 ENCOUNTER — Ambulatory Visit (INDEPENDENT_AMBULATORY_CARE_PROVIDER_SITE_OTHER): Payer: PPO | Admitting: Family Medicine

## 2015-09-18 ENCOUNTER — Encounter: Payer: Self-pay | Admitting: Family Medicine

## 2015-09-18 VITALS — BP 122/75 | HR 94 | Temp 97.0°F | Ht 73.0 in | Wt 275.0 lb

## 2015-09-18 DIAGNOSIS — M79671 Pain in right foot: Secondary | ICD-10-CM

## 2015-09-18 DIAGNOSIS — J0100 Acute maxillary sinusitis, unspecified: Secondary | ICD-10-CM | POA: Diagnosis not present

## 2015-09-18 DIAGNOSIS — G8929 Other chronic pain: Secondary | ICD-10-CM | POA: Diagnosis not present

## 2015-09-18 MED ORDER — FLUTICASONE PROPIONATE 50 MCG/ACT NA SUSP: 1.0000 | Freq: Two times a day (BID) | NASAL | 6 refills | Status: DC | PRN

## 2015-09-18 MED ORDER — OXYCODONE HCL 10 MG PO TABS: 10.0000 mg | ORAL_TABLET | Freq: Four times a day (QID) | ORAL | 0 refills | Status: DC

## 2015-09-18 NOTE — Progress Notes (Signed)
BP 122/75   Pulse 94   Temp 97 F (36.1 C) (Oral)   Ht 6\' 1"  (1.854 m)   Wt 275 lb (124.7 kg)   BMI 36.28 kg/m    Subjective:    Patient ID: Timothy Nelson., male    DOB: 17-Mar-1965, 50 y.o.   MRN: [id number]  HPI: Timothy Nelson. is a 50 y.o. male presenting on 09/18/2015 for Chronic foot pain (followup); Sinusitis (sinus drainage and congestion); and Cough   HPI Chronic foot pain recheck Patient is coming in today for chronic foot pain recheck. He has been doing well on his current medication. He continues to see a podiatrist that help shave off the calluses that haven't given him a lot of pain. He rates his pain today at rest 3 out of 10. When he gets up and ambulates it goes up to 6 or 7 out of 10. He denies any pain radiating anywhere else. He has been pretty satisfied with his dose so far. He has been coming every month for urine drug screens because he had a failed 15 months ago, he has been doing well and has no other failures and will push it out 2 months.  Sinus congestion Patient is coming in for sinus congestion and pressure and sneezing. He denies any fevers or chills or shortness of breath or wheezing. He is having some postnasal drainage and congestion and thinks it is allergies that are coming up again. He has not really used anything over-the-counter. This is been increased over the past 2 days.  Relevant past medical, surgical, family and social history reviewed and updated as indicated. Interim medical history since our last visit reviewed. Allergies and medications reviewed and updated.  Review of Systems  Constitutional: Negative for chills and fever.  HENT: Positive for congestion, postnasal drip, rhinorrhea, sinus pressure, sneezing and sore throat. Negative for ear discharge, ear pain and voice change.   Eyes: Negative for pain, discharge, redness and visual disturbance.  Respiratory: Negative for shortness of breath and wheezing.   Cardiovascular: Negative  for chest pain and leg swelling.  Musculoskeletal: Positive for arthralgias and gait problem.  Skin: Negative for rash.  All other systems reviewed and are negative.   Per HPI unless specifically indicated above       Objective:    BP 122/75   Pulse 94   Temp 97 F (36.1 C) (Oral)   Ht 6\' 1"  (1.854 m)   Wt 275 lb (124.7 kg)   BMI 36.28 kg/m   Wt Readings from Last 3 Encounters:  09/18/15 275 lb (124.7 kg)  08/18/15 275 lb 3.2 oz (124.8 kg)  07/21/15 274 lb 9.6 oz (124.6 kg)    Physical Exam  Constitutional: He is oriented to person, place, and time. He appears well-developed and well-nourished. No distress.  HENT:  Right Ear: Tympanic membrane, external ear and ear canal normal.  Left Ear: Tympanic membrane, external ear and ear canal normal.  Nose: Mucosal edema and rhinorrhea present. No sinus tenderness. No epistaxis. Right sinus exhibits maxillary sinus tenderness. Right sinus exhibits no frontal sinus tenderness. Left sinus exhibits maxillary sinus tenderness. Left sinus exhibits no frontal sinus tenderness.  Mouth/Throat: Uvula is midline and mucous membranes are normal. Posterior oropharyngeal edema and posterior oropharyngeal erythema present. No oropharyngeal exudate or tonsillar abscesses.  Eyes: Conjunctivae are normal. Right eye exhibits no discharge. Left eye exhibits no discharge. No scleral icterus.  Neck: Neck supple. No thyromegaly present.  Cardiovascular: Normal rate,  regular rhythm, normal heart sounds and intact distal pulses.   No murmur heard. Pulmonary/Chest: Effort normal and breath sounds normal. No respiratory distress. He has no wheezes. He has no rales.  Musculoskeletal: Normal range of motion. He exhibits no edema.  Bilateral ankle and foot tenderness, chronic, patient also has deformed inturned ankles because of previous surgeries and fractures.  Lymphadenopathy:    He has no cervical adenopathy.  Neurological: He is alert and oriented to  person, place, and time. Coordination normal.  Skin: Skin is warm and dry. No rash noted. He is not diaphoretic.  Psychiatric: He has a normal mood and affect. His behavior is normal.  Nursing note and vitals reviewed.     Assessment & Plan:   Problem List Items Addressed This Visit      Other   Chronic foot pain - Primary   Relevant Medications   Oxycodone HCl 10 MG TABS   Oxycodone HCl 10 MG TABS   Other Relevant Orders   ToxASSURE Select 13 (MW), Urine    Other Visit Diagnoses    Acute maxillary sinusitis, recurrence not specified       Relevant Medications   fluticasone (FLONASE) 50 MCG/ACT nasal spray       Follow up plan: Return if symptoms worsen or fail to improve.  Counseling provided for all of the vaccine components Orders Placed This Encounter  Procedures  . ToxASSURE Select 13 (MW), Urine    Caryl Pina, MD [facility] 09/18/2015, 9:12 AM

## 2015-09-26 LAB — TOXASSURE SELECT 13 (MW), URINE

## 2015-09-29 ENCOUNTER — Encounter (INDEPENDENT_AMBULATORY_CARE_PROVIDER_SITE_OTHER): Payer: Self-pay | Admitting: Internal Medicine

## 2015-10-01 LAB — HM DIABETES EYE EXAM

## 2015-10-20 ENCOUNTER — Ambulatory Visit (INDEPENDENT_AMBULATORY_CARE_PROVIDER_SITE_OTHER): Payer: PPO | Admitting: Podiatry

## 2015-10-20 ENCOUNTER — Encounter: Payer: Self-pay | Admitting: Podiatry

## 2015-10-20 DIAGNOSIS — Q828 Other specified congenital malformations of skin: Secondary | ICD-10-CM | POA: Diagnosis not present

## 2015-10-20 DIAGNOSIS — E1149 Type 2 diabetes mellitus with other diabetic neurological complication: Secondary | ICD-10-CM | POA: Diagnosis not present

## 2015-10-20 NOTE — Progress Notes (Signed)
Subjective: Patient presents today for continued care of painful calluses to both of his feet. He states the left side is worse than the right however the right side is the numb. Denies any systemic complaints such as fevers, chills, nausea, vomiting. No acute changes since last appointment, and no other complaints at this time.   Objective: AAO x3, NAD DP/PT pulses palpable bilaterally, CRT less than 3 seconds Bilateral submetatarsal 5 hyperkeratotic lesion. Upon debridement no underlying ulceration, drainage or any signs of infection. On the left side there is a deeper, annular hyperkerotic lesion. There is no evidence of pinpoint bleeding or evidence of verruca. No foreign body identified. No edema, erythema, increase in warmth to bilateral lower extremities.  No open lesions or pre-ulcerative lesions.  No pain with calf compression, swelling, warmth, erythema  Assessment: Porokeratosis bilateral subtarsal 5  Plan: -All treatment options discussed with the patient including all alternatives, risks, complications.  -Lesions debrided 2 without couple complications or bleeding. On the left side a pad was placed in cleaned and salinocaine was applied followed by a DSD. Post procedure instructions were discussed. Monitor for signs or symptoms of infection. -Follow-up in 4 weeks at his request.  -Patient encouraged to call the office with any questions, concerns, change in symptoms.   Celesta Gentile, DPM

## 2015-11-14 ENCOUNTER — Encounter: Payer: Self-pay | Admitting: Family Medicine

## 2015-11-14 ENCOUNTER — Ambulatory Visit (INDEPENDENT_AMBULATORY_CARE_PROVIDER_SITE_OTHER): Payer: Commercial Managed Care - HMO | Admitting: Family Medicine

## 2015-11-14 VITALS — BP 119/67 | HR 78 | Temp 97.5°F | Ht 73.0 in | Wt 276.0 lb

## 2015-11-14 DIAGNOSIS — E1165 Type 2 diabetes mellitus with hyperglycemia: Secondary | ICD-10-CM

## 2015-11-14 DIAGNOSIS — M79671 Pain in right foot: Secondary | ICD-10-CM | POA: Diagnosis not present

## 2015-11-14 DIAGNOSIS — IMO0001 Reserved for inherently not codable concepts without codable children: Secondary | ICD-10-CM

## 2015-11-14 DIAGNOSIS — G8929 Other chronic pain: Secondary | ICD-10-CM | POA: Diagnosis not present

## 2015-11-14 LAB — BAYER DCA HB A1C WAIVED: HB A1C (BAYER DCA - WAIVED): 7.9 % — ABNORMAL HIGH (ref <7.0)

## 2015-11-14 MED ORDER — OXYCODONE HCL 10 MG PO TABS: 10.0000 mg | ORAL_TABLET | Freq: Four times a day (QID) | ORAL | 0 refills | Status: DC | PRN

## 2015-11-14 MED ORDER — OXYCODONE HCL 10 MG PO TABS: 10.0000 mg | ORAL_TABLET | Freq: Four times a day (QID) | ORAL | 0 refills | Status: DC

## 2015-11-14 NOTE — Progress Notes (Signed)
Subjective:    Patient ID: Panth Hieb., male    DOB: 1965/05/02, 50 y.o.   MRN: [id number]  HPI Patient here today for pain management follow up.  Seen daily 2 months for assessment of pain response. Roxicodone dulls the pain in the is able to function. He is also requesting A1c today. Medicines for diabetes include include glipizide metformin and Jardiance.    Patient Active Problem List   Diagnosis Date Noted  . GERD (gastroesophageal reflux disease) 01/09/2015  . Diabetes type 2, uncontrolled ([place]) 09/11/2014  . Essential hypertension, benign 09/11/2014  . Hyperlipidemia LDL goal <100 09/11/2014  . Chronic foot pain 09/11/2014   Outpatient Encounter Prescriptions as of 11/14/2015  Medication Sig  . CINNAMON PO Take 1 capsule by mouth 2 (two) times daily.  . empagliflozin (JARDIANCE) 10 MG TABS tablet Take 10 mg by mouth daily.  . fluticasone (FLONASE) 50 MCG/ACT nasal spray Place 1 spray into both nostrils 2 (two) times daily as needed for allergies or rhinitis.  Marland Kitchen glipiZIDE (GLUCOTROL XL) 10 MG 24 hr tablet TAKE 1 TABLET (10 MG TOTAL) BY MOUTH DAILY.  Marland Kitchen glucose blood (ACCU-CHEK AVIVA) test strip Use as instructed  . Lancet Devices (ACCU-CHEK SOFTCLIX) lancets Use as instructed  . lisinopril (PRINIVIL,ZESTRIL) 20 MG tablet TAKE 1 TABLET (20 MG TOTAL) BY MOUTH DAILY.  . metFORMIN (GLUCOPHAGE) 1000 MG tablet TAKE 1 TABLET (1,000 MG TOTAL) BY MOUTH 2 (TWO) TIMES DAILY.  Marland Kitchen metoCLOPramide (REGLAN) 10 MG tablet TAKE 1 TABLET (10 MG TOTAL) BY MOUTH 3 (THREE) TIMES DAILY BEFORE MEALS.  Marland Kitchen Oxycodone HCl 10 MG TABS Take 1 tablet (10 mg total) by mouth 4 (four) times daily.  . simvastatin (ZOCOR) 20 MG tablet Take 1 tablet (20 mg total) by mouth daily at 6 PM.  . triamcinolone cream (KENALOG) 0.1 % Apply 1 application topically 2 (two) times daily.  . [DISCONTINUED] Insulin Pen Needle (PEN NEEDLES) 31G X 8 MM MISC Use to inject Victoza daily  . [DISCONTINUED] Liraglutide 18 MG/3ML SOPN  Inject 0.2 mLs (1.2 mg total) into the skin at bedtime.   No facility-administered encounter medications on file as of 11/14/2015.      Review of Systems  Constitutional: Negative.   HENT: Negative.   Eyes: Negative.   Respiratory: Negative.   Cardiovascular: Negative.   Gastrointestinal: Negative.   Endocrine: Negative.   Genitourinary: Negative.   Musculoskeletal: Positive for arthralgias (right lower ext. - right ankle).  Skin: Negative.   Allergic/Immunologic: Negative.   Neurological: Negative.   Hematological: Negative.   Psychiatric/Behavioral: Negative.        Objective:   Physical Exam  Constitutional: He is oriented to person, place, and time. He appears well-developed and well-nourished.  Cardiovascular: Normal rate.   Pulmonary/Chest: Effort normal.  Neurological: He is alert and oriented to person, place, and time.  Psychiatric: He has a normal mood and affect. His behavior is normal.    BP 119/67 (BP Location: Left Arm)   Pulse 78   Temp 97.5 F (36.4 C) (Oral)   Ht 6\' 1"  (1.854 m)   Wt 276 lb (125.2 kg)   BMI 36.41 kg/m          Assessment & Plan:  1. Chronic foot pain, right He'll oxycodone for 1 month with 1 refill - ToxASSURE Select 13 (MW), Urine - Oxycodone HCl 10 MG TABS; Take 1 tablet (10 mg total) by mouth 4 (four) times daily.  Dispense: 120 tablet; Refill: 0  2. Uncontrolled type 2 diabetes mellitus without complication, without long-term current use of insulin (HCC) Continue current regimen pending results of A1c - Bayer DCA Hb A1c Waived  Wardell Honour MD

## 2015-11-17 ENCOUNTER — Ambulatory Visit: Payer: PPO | Admitting: Family Medicine

## 2015-11-18 ENCOUNTER — Ambulatory Visit (INDEPENDENT_AMBULATORY_CARE_PROVIDER_SITE_OTHER): Payer: Commercial Managed Care - HMO | Admitting: Internal Medicine

## 2015-11-18 ENCOUNTER — Encounter (INDEPENDENT_AMBULATORY_CARE_PROVIDER_SITE_OTHER): Payer: Self-pay | Admitting: Internal Medicine

## 2015-11-18 VITALS — BP 116/76 | HR 68 | Temp 98.4°F | Resp 18 | Ht 73.0 in | Wt 270.8 lb

## 2015-11-18 DIAGNOSIS — K2270 Barrett's esophagus without dysplasia: Secondary | ICD-10-CM

## 2015-11-18 DIAGNOSIS — K3184 Gastroparesis: Secondary | ICD-10-CM

## 2015-11-18 DIAGNOSIS — E1143 Type 2 diabetes mellitus with diabetic autonomic (poly)neuropathy: Secondary | ICD-10-CM

## 2015-11-18 MED ORDER — METOCLOPRAMIDE HCL 10 MG PO TABS: 10.0000 mg | ORAL_TABLET | Freq: Three times a day (TID) | ORAL | 0 refills | Status: DC

## 2015-11-18 NOTE — Patient Instructions (Addendum)
Please call office when you're able to obtain domperidone. Call office if you notice tremors or experience any other side effects with metoclopramide.

## 2015-11-18 NOTE — Progress Notes (Signed)
Presenting complaint;  Follow-up for gastroparesis and Barrett's esophagus.  Database and Subjective:  Timothy Nelson is 50 year old Caucasian male who is here for scheduled visit. He was initially seen in March 2017 for GERD symptoms unresponsive to therapy. He underwent EGD on 04/07/2015 revealing short segment Barrett's esophagus(confirmed with biopsy) and stomach for the food debris and patent pylorus. He was felt to have gastroparesis secondary to diabetes mellitus(pain medication may also be contributing to gastric dysmotility). Patient was advised to continue PPI and begun on metoclopramide. He was seen in the office on 05/13/2015 and was doing well. He was not having any side effects with metoclopramide.  He continues to feel well. He has not experienced any heartburn since he has been on metoclopramide. He decided to stop omeprazole sometime after his last visit 6 months ago. He did not see any benefit. He denies dysphagia or regurgitation. He experiences bloating with certain foods such as pizza and onions. He denies abdominal pain. Bowels move once or twice daily. He denies melena or rectal bleeding. He is not having any side effects with metoclopramide. He has lost 8 pounds since his last visit. He is hoping to lose more weight. He states his peak weight was 295 pounds in 2009. He has scattered quit drinking carbonated drinks and just drinks water. He takes pain pills daily for chronic pain.   Current Medications: Outpatient Encounter Prescriptions as of 11/18/2015  Medication Sig  . CINNAMON PO Take 1 capsule by mouth 2 (two) times daily.  . empagliflozin (JARDIANCE) 10 MG TABS tablet Take 10 mg by mouth daily.  . fluticasone (FLONASE) 50 MCG/ACT nasal spray Place 1 spray into both nostrils 2 (two) times daily as needed for allergies or rhinitis.  Marland Kitchen glipiZIDE (GLUCOTROL XL) 10 MG 24 hr tablet TAKE 1 TABLET (10 MG TOTAL) BY MOUTH DAILY.  Marland Kitchen glucose blood (ACCU-CHEK AVIVA) test strip Use as  instructed  . Lancet Devices (ACCU-CHEK SOFTCLIX) lancets Use as instructed  . lisinopril (PRINIVIL,ZESTRIL) 20 MG tablet TAKE 1 TABLET (20 MG TOTAL) BY MOUTH DAILY.  . metFORMIN (GLUCOPHAGE) 1000 MG tablet TAKE 1 TABLET (1,000 MG TOTAL) BY MOUTH 2 (TWO) TIMES DAILY.  Marland Kitchen metoCLOPramide (REGLAN) 10 MG tablet TAKE 1 TABLET (10 MG TOTAL) BY MOUTH 3 (THREE) TIMES DAILY BEFORE MEALS.  Marland Kitchen Oxycodone HCl 10 MG TABS Take 1 tablet (10 mg total) by mouth 4 (four) times daily as needed.  . simvastatin (ZOCOR) 20 MG tablet Take 1 tablet (20 mg total) by mouth daily at 6 PM.  . triamcinolone cream (KENALOG) 0.1 % Apply 1 application topically 2 (two) times daily.  . [DISCONTINUED] Oxycodone HCl 10 MG TABS Take 1 tablet (10 mg total) by mouth 4 (four) times daily. (Patient not taking: Reported on 11/18/2015)   No facility-administered encounter medications on file as of 11/18/2015.      Objective: Blood pressure 116/76, pulse 68, temperature 98.4 F (36.9 C), temperature source Oral, resp. rate 18, height 6\' 1"  (1.854 m), weight 270 lb 12.8 oz (122.8 kg). Patient is alert and in no acute distress. Conjunctiva is pink. Sclera is nonicteric Oropharyngeal mucosa is normal. He does not have dystonic movements of lips or tongue. No neck masses or thyromegaly noted. Cardiac exam with regular rhythm normal S1 and S2. No murmur or gallop noted. Lungs are clear to auscultation. Abdomen is full but soft and nontender without organomegaly or masses.  No LE edema or clubbing noted. No hand tremors noted.   Assessment:  #1. Gastroparesis appeared  to be secondary to diabetes mellitus but gastric motility may be further hampered with chronic narcotic use. He has had complete symptom relief with dietary measures and metoclopramide. Patient does not feel that we should determine severity of gastroparesis with emptying study. #2. Short segment Barrett's esophagus. Biopsy was negative for dysplasia. He decided to stop  PPI on his own but not having any heartburn. #3. Patient is average risk for CRC. Last colonoscopy was about 4 years ago and the next exam would be in 6 years.  Plan:  Continue anti-reflux measures. Gastroparesis diet. Patient was given printed material. [place] and if [place] works will stop metoclopramide. Patient advised to stop metoclopramide if he has any side effects and call office. Office visit in 6 months.

## 2015-11-20 ENCOUNTER — Other Ambulatory Visit: Payer: Self-pay | Admitting: Family Medicine

## 2015-11-21 LAB — TOXASSURE SELECT 13 (MW), URINE

## 2015-11-24 ENCOUNTER — Ambulatory Visit (INDEPENDENT_AMBULATORY_CARE_PROVIDER_SITE_OTHER): Payer: Commercial Managed Care - HMO | Admitting: Podiatry

## 2015-11-24 ENCOUNTER — Encounter: Payer: Self-pay | Admitting: Podiatry

## 2015-11-24 DIAGNOSIS — E1149 Type 2 diabetes mellitus with other diabetic neurological complication: Secondary | ICD-10-CM | POA: Diagnosis not present

## 2015-11-24 DIAGNOSIS — Q828 Other specified congenital malformations of skin: Secondary | ICD-10-CM | POA: Diagnosis not present

## 2015-11-24 NOTE — Progress Notes (Signed)
Subjective: Patient presents today for continued care of painful calluses to both of his feet.  Left side continues to be more painful as he has loss of sensation on the right. Denies any systemic complaints such as fevers, chills, nausea, vomiting. No acute changes since last appointment, and no other complaints at this time.   Objective: AAO x3, NAD DP/PT pulses palpable bilaterally, CRT less than 3 seconds Bilateral submetatarsal 5 hyperkeratotic lesion. Upon debridement no underlying ulceration, drainage or any signs of infection. On the left side there is a deeper, annular hyperkerotic lesion. There is no evidence of pinpoint bleeding or evidence of verruca. No foreign body identified. No edema, erythema, increase in warmth to bilateral lower extremities.  No open lesions or pre-ulcerative lesions.  No pain with calf compression, swelling, warmth, erythema  Assessment: Porokeratosis bilateral subtarsal 5  Plan: -All treatment options discussed with the patient including all alternatives, risks, complications.  -Lesions debrided 2 without couple complications or bleeding.  -He would also like orthotics. He was scanned for them today there were sent to [facility] labs.We will do a soft top cover with the fifth ray cut out bilaterally. -Follow-up in 4 weeks at his request.  -Patient encouraged to call the office with any questions, concerns, change in symptoms.   Celesta Gentile, DPM

## 2015-12-11 ENCOUNTER — Ambulatory Visit (INDEPENDENT_AMBULATORY_CARE_PROVIDER_SITE_OTHER): Payer: Commercial Managed Care - HMO | Admitting: Family Medicine

## 2015-12-11 ENCOUNTER — Encounter: Payer: Self-pay | Admitting: Family Medicine

## 2015-12-11 VITALS — BP 123/80 | HR 94 | Temp 97.7°F | Ht 73.0 in | Wt 269.6 lb

## 2015-12-11 DIAGNOSIS — M898X8 Other specified disorders of bone, other site: Secondary | ICD-10-CM

## 2015-12-11 MED ORDER — CYCLOBENZAPRINE HCL 10 MG PO TABS: 10.0000 mg | ORAL_TABLET | Freq: Three times a day (TID) | ORAL | 0 refills | Status: DC | PRN

## 2015-12-11 MED ORDER — METHYLPREDNISOLONE ACETATE 80 MG/ML IJ SUSP
80.0000 mg | Freq: Once | INTRAMUSCULAR | Status: AC
  Administered 2015-12-11: 80 mg via INTRAMUSCULAR

## 2015-12-11 NOTE — Progress Notes (Signed)
   HPI  Patient presents today here with bilateral flank pain.  Patient explains that he is nonradiating flank pain, worse with sitting or laying down. He states that whenever he rolls over at night whichever side he lays on hurts worse. He has tried BenGay and topical pain medications with not much improvement.  He does not take naproxen due to difficulty with upset stomach when he takes it.  Patient states that he does not check his blood sugar he is not worried about elevated blood sugars with steroids. He watches his diet carefully and takes his medications regularly as it pertains to diabetes.  He denies any injury that began this. He states has been going on for 3 days and slowly worsening. He denies any dysuria, fever, chills, sweats, or feeling ill.  PMH: Smoking status noted ROS: Per HPI  Objective: BP 123/80   Pulse 94   Temp 97.7 F (36.5 C) (Oral)   Ht 6\' 1"  (1.854 m)   Wt 269 lb 9.6 oz (122.3 kg)   BMI 35.57 kg/m  Gen: NAD, alert, cooperative with exam HEENT: NCAT CV: RRR, good S1/S2, no murmur Resp: CTABL, no wheezes, non-labored Ext: No edema, warm Neuro: Alert and oriented, No gross deficits  MSK:  Significant tenderness to palp on BL iliac crests No evidence of palpation of lumbar spine or paraspinal muscles.  Assessment and plan:  # Iliac crest bone pain Unclear etiology, however he has tenderness across the iliac crest bilaterally I given him a IM injection of Depo-Medrol, discussed extensively that this will have deleterious effects on his diabetic control. He is okay with this. Also given Flexeril as he hasn't nighttime symptoms that this may help. NSAIDs okay to add on top    Laroy Apple, MD [facility] 12/11/2015, 3:08 PM

## 2015-12-11 NOTE — Patient Instructions (Signed)
Great to meet you! 

## 2015-12-22 ENCOUNTER — Encounter: Payer: Self-pay | Admitting: Podiatry

## 2015-12-22 ENCOUNTER — Ambulatory Visit (INDEPENDENT_AMBULATORY_CARE_PROVIDER_SITE_OTHER): Payer: Commercial Managed Care - HMO | Admitting: Podiatry

## 2015-12-22 DIAGNOSIS — Q828 Other specified congenital malformations of skin: Secondary | ICD-10-CM | POA: Diagnosis not present

## 2015-12-22 DIAGNOSIS — E1149 Type 2 diabetes mellitus with other diabetic neurological complication: Secondary | ICD-10-CM

## 2015-12-22 NOTE — Patient Instructions (Signed)

## 2015-12-22 NOTE — Progress Notes (Signed)
Subjective: Patient presents today for continued care of painful calluses to both of his feet.  Left side continues to be more painful as he has loss of sensation on the right. He presents today to [facility] as well. Denies any systemic complaints such as fevers, chills, nausea, vomiting. No acute changes since last appointment, and no other complaints at this time.   Objective: AAO x3, NAD DP/PT pulses palpable bilaterally, CRT less than 3 seconds Bilateral submetatarsal 5 hyperkeratotic lesion. Upon debridement no underlying ulceration, drainage or any signs of infection. On the left side there is a deeper, annular hyperkerotic lesion. There is no evidence of pinpoint bleeding or evidence of verruca. No foreign body identified. No edema, erythema, increase in warmth to bilateral lower extremities.  No open lesions or pre-ulcerative lesions.  No pain with calf compression, swelling, warmth, erythema  Assessment: Porokeratosis bilateral subtarsal 5  Plan: -All treatment options discussed with the patient including all alternatives, risks, complications.  -Lesions debrided 2 without couple complications or bleeding.  -Orthotics dispensed today. Oral and written break-in instructions discussed.  -Follow-up in 4 weeks -Patient encouraged to call the office with any questions, concerns, change in symptoms.   Celesta Gentile, DPM

## 2016-01-15 ENCOUNTER — Encounter: Payer: Self-pay | Admitting: Family Medicine

## 2016-01-15 ENCOUNTER — Ambulatory Visit (INDEPENDENT_AMBULATORY_CARE_PROVIDER_SITE_OTHER): Payer: Medicare HMO | Admitting: Family Medicine

## 2016-01-15 VITALS — BP 136/85 | HR 77 | Temp 97.3°F | Ht 73.0 in | Wt 271.4 lb

## 2016-01-15 DIAGNOSIS — G8929 Other chronic pain: Secondary | ICD-10-CM

## 2016-01-15 DIAGNOSIS — M79671 Pain in right foot: Secondary | ICD-10-CM | POA: Diagnosis not present

## 2016-01-15 MED ORDER — ACCU-CHEK SOFTCLIX LANCET DEV MISC: 0 refills | Status: DC

## 2016-01-15 MED ORDER — LISINOPRIL 20 MG PO TABS
ORAL_TABLET | ORAL | 2 refills | Status: DC
Start: 2016-01-15 — End: 2016-09-15

## 2016-01-15 MED ORDER — OXYCODONE HCL 10 MG PO TABS: 10.0000 mg | ORAL_TABLET | Freq: Four times a day (QID) | ORAL | 0 refills | Status: DC | PRN

## 2016-01-15 MED ORDER — METFORMIN HCL 1000 MG PO TABS: 1000.0000 mg | ORAL_TABLET | Freq: Two times a day (BID) | ORAL | 2 refills | Status: DC

## 2016-01-15 MED ORDER — GLIPIZIDE ER 10 MG PO TB24: 10.0000 mg | ORAL_TABLET | Freq: Every day | ORAL | 2 refills | Status: DC

## 2016-01-15 MED ORDER — EMPAGLIFLOZIN 10 MG PO TABS: 10.0000 mg | ORAL_TABLET | Freq: Every day | ORAL | 3 refills | Status: DC

## 2016-01-15 MED ORDER — SIMVASTATIN 20 MG PO TABS: 20.0000 mg | ORAL_TABLET | Freq: Every day | ORAL | 0 refills | Status: DC

## 2016-01-15 MED ORDER — GLUCOSE BLOOD VI STRP: ORAL_STRIP | 12 refills | Status: DC

## 2016-01-15 NOTE — Progress Notes (Signed)
BP 136/85   Pulse 77   Temp 97.3 F (36.3 C) (Oral)   Ht 6\' 1"  (1.854 m)   Wt 271 lb 6 oz (123.1 kg)   BMI 35.80 kg/m    Subjective:    Patient ID: Timothy Bouche., male    DOB: 1965-02-24, 51 y.o.   MRN: [id number]  HPI: Timothy Nelson. is a 51 y.o. male presenting on 01/15/2016 for Pain management Medication Refill (all meds)   HPI Chronic pain in bilateral feet and ankles Patient is coming back for chronic pain management recheck. He is currently on oxycodone 10 mg 4 times daily as needed. He has been using it consistently and following the directions were recently. About 7 months ago he did have a positive for cocaine and we have been watching him closely with urine drug screens and he has been doing very well since. He says that it is managed his pain pretty well and he is satisfied with where sat. His pain today as a 3 out of 10. He also sees a podiatrist to help manage his calluses.  Relevant past medical, surgical, family and social history reviewed and updated as indicated. Interim medical history since our last visit reviewed. Allergies and medications reviewed and updated.  Review of Systems  Constitutional: Negative for chills and fever.  Eyes: Negative for discharge.  Respiratory: Negative for shortness of breath and wheezing.   Cardiovascular: Negative for chest pain and leg swelling.  Musculoskeletal: Positive for arthralgias. Negative for back pain, gait problem and joint swelling.  Skin: Negative for rash.  All other systems reviewed and are negative.   Per HPI unless specifically indicated above   Allergies as of 01/15/2016      Reactions   Fenofibrate Rash   Permament rash.      Medication List       Accurate as of 01/15/16  8:44 AM. Always use your most recent med list.          accu-chek softclix lancets Use as instructed   CINNAMON PO Take 1 capsule by mouth 2 (two) times daily.   cyclobenzaprine 10 MG tablet Commonly known as:   FLEXERIL Take 1 tablet (10 mg total) by mouth 3 (three) times daily as needed for muscle spasms.   empagliflozin 10 MG Tabs tablet Commonly known as:  JARDIANCE Take 10 mg by mouth daily.   fluticasone 50 MCG/ACT nasal spray Commonly known as:  FLONASE Place 1 spray into both nostrils 2 (two) times daily as needed for allergies or rhinitis.   glipiZIDE 10 MG 24 hr tablet Commonly known as:  GLUCOTROL XL Take 1 tablet (10 mg total) by mouth daily with breakfast.   glucose blood test strip Commonly known as:  ACCU-CHEK AVIVA Use as instructed   lisinopril 20 MG tablet Commonly known as:  PRINIVIL,ZESTRIL TAKE 1 TABLET (20 MG TOTAL) BY MOUTH DAILY.   metFORMIN 1000 MG tablet Commonly known as:  GLUCOPHAGE Take 1 tablet (1,000 mg total) by mouth 2 (two) times daily with a meal.   metoCLOPramide 10 MG tablet Commonly known as:  REGLAN Take 1 tablet (10 mg total) by mouth 3 (three) times daily before meals.   Oxycodone HCl 10 MG Tabs Take 1 tablet (10 mg total) by mouth 4 (four) times daily as needed.   Oxycodone HCl 10 MG Tabs Take 1 tablet (10 mg total) by mouth 4 (four) times daily as needed.   Oxycodone HCl 10 MG Tabs Take 1 tablet (  10 mg total) by mouth 4 (four) times daily as needed. Do not fill until 30 days from prescription date   simvastatin 20 MG tablet Commonly known as:  ZOCOR Take 1 tablet (20 mg total) by mouth daily at 6 PM.   triamcinolone cream 0.1 % Commonly known as:  KENALOG Apply 1 application topically 2 (two) times daily.          Objective:    BP 136/85   Pulse 77   Temp 97.3 F (36.3 C) (Oral)   Ht 6\' 1"  (1.854 m)   Wt 271 lb 6 oz (123.1 kg)   BMI 35.80 kg/m   Wt Readings from Last 3 Encounters:  01/15/16 271 lb 6 oz (123.1 kg)  12/11/15 269 lb 9.6 oz (122.3 kg)  11/18/15 270 lb 12.8 oz (122.8 kg)    Physical Exam  Constitutional: He is oriented to person, place, and time. He appears well-developed and well-nourished. No  distress.  Eyes: Conjunctivae are normal. Right eye exhibits no discharge. No scleral icterus.  Musculoskeletal: Normal range of motion. He exhibits no edema.  Both ankles are slightly deformed with the valgus deformation and slightly increased in with on both sides, patient is chronically like this because of previous accident and surgeries.  Neurological: He is alert and oriented to person, place, and time. Coordination normal.  Skin: Skin is warm and dry. No rash noted. He is not diaphoretic.  Psychiatric: He has a normal mood and affect. His behavior is normal.  Nursing note and vitals reviewed.     Assessment & Plan:   Problem List Items Addressed This Visit      Other   Chronic foot pain - Primary   Relevant Medications   Oxycodone HCl 10 MG TABS   Oxycodone HCl 10 MG TABS       Follow up plan: Return in about 2 months (around 03/14/2016), or if symptoms worsen or fail to improve, for Diabetes and hypertension and pain.  Counseling provided for all of the vaccine components No orders of the defined types were placed in this encounter.   Caryl Pina, MD [facility] 01/15/2016, 8:44 AM

## 2016-01-29 ENCOUNTER — Ambulatory Visit (INDEPENDENT_AMBULATORY_CARE_PROVIDER_SITE_OTHER): Payer: Medicare HMO | Admitting: Family Medicine

## 2016-01-29 ENCOUNTER — Encounter: Payer: Self-pay | Admitting: Family Medicine

## 2016-01-29 VITALS — BP 133/85 | HR 96 | Temp 97.4°F | Ht 73.0 in | Wt 273.0 lb

## 2016-01-29 DIAGNOSIS — J0111 Acute recurrent frontal sinusitis: Secondary | ICD-10-CM

## 2016-01-29 MED ORDER — PREDNISONE 20 MG PO TABS: ORAL_TABLET | ORAL | 0 refills | Status: DC

## 2016-01-29 MED ORDER — AZITHROMYCIN 250 MG PO TABS: ORAL_TABLET | ORAL | 0 refills | Status: DC

## 2016-01-29 NOTE — Progress Notes (Signed)
   BP 133/85   Pulse 96   Temp 97.4 F (36.3 C) (Oral)   Ht 6\' 1"  (1.854 m)   Wt 273 lb (123.8 kg)   BMI 36.02 kg/m    Subjective:    Patient ID: Timothy Bouche., male    DOB: [birth year], 51 y.o.   MRN: [id number]  HPI: Timothy Gawron. is a 51 y.o. male presenting on 01/29/2016 for Cough (productive green phlegm, no known fever, started Tuesday, nasal congestion using Flonase, OTC cold/flu w/ cough) and Sore Throat (scratchy)   HPI  Cough and nasal congestion Timothy Nelson is a 51 year old male presenting with a dry cough, nasal congestion and a scratchy throat for the last two days. He describes the cough as non-productive, worse at night, keeps him awake, and it has started to cause some lower rib pain.  His nose has not been running, but he admits to nasal congestion and a yellow and green mucus when he blows it.  He has used Flonase the last 2 days without much relief.  He has also used an OTC cough suppressant that has helped a little with the cough at night. He denies any fevers, chills, body aches, or difficulty breathing.   Relevant past medical, surgical, family and social history reviewed and updated as indicated. Interim medical history since our last visit reviewed. Allergies and medications reviewed and updated.  Review of Systems  Constitutional: Negative for chills, fatigue and fever.  HENT: Positive for congestion. Negative for ear pain, hearing loss, postnasal drip, rhinorrhea and sore throat.   Respiratory: Positive for cough. Negative for chest tightness, shortness of breath and wheezing.   Cardiovascular: Negative for chest pain.  Gastrointestinal: Negative for abdominal pain, diarrhea, nausea and vomiting.  Psychiatric/Behavioral: Positive for sleep disturbance.    Per HPI unless specifically indicated above     Objective:    BP 133/85   Pulse 96   Temp 97.4 F (36.3 C) (Oral)   Ht 6\' 1"  (1.854 m)   Wt 273 lb (123.8 kg)   BMI 36.02 kg/m   Wt Readings from  Last 3 Encounters:  01/29/16 273 lb (123.8 kg)  01/15/16 271 lb 6 oz (123.1 kg)  12/11/15 269 lb 9.6 oz (122.3 kg)    Physical Exam  Constitutional: He appears well-developed and well-nourished. No distress.  HENT:  Right Ear: Tympanic membrane and ear canal normal. No middle ear effusion.  Left Ear: Tympanic membrane and ear canal normal.  No middle ear effusion.  Nose: Right sinus exhibits frontal sinus tenderness. Left sinus exhibits frontal sinus tenderness.  Mouth/Throat: Uvula is midline and mucous membranes are normal. Posterior oropharyngeal erythema present. No oropharyngeal exudate.  Cardiovascular: Normal rate, regular rhythm and normal heart sounds.   Pulmonary/Chest: Effort normal and breath sounds normal. No respiratory distress.  Neurological: He is alert.      Assessment & Plan:   Problem List Items Addressed This Visit    None    Visit Diagnoses    Acute recurrent frontal sinusitis    -  Primary   Relevant Medications   azithromycin (ZITHROMAX) 250 MG tablet   predniSONE (DELTASONE) 20 MG tablet      Follow up plan: Return if symptoms worsen or fail to improve.  Counseling provided for all of the vaccine components No orders of the defined types were placed in this encounter.   Janace Hoard, PA-S [facility] 01/29/2016, 10:53 AM

## 2016-02-02 IMAGING — NM NM MYOCAR MULTI W/SPECT W/WALL MOTION & EF
2 series · 12 of 12 positions shown · non-contrast
Comparison: none

[Series 1: rest · 8.28mm/px · 6 of 64 frames shown]
[frame 6/64]
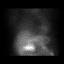
[frame 16/64]
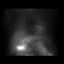
[frame 27/64]
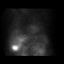
[frame 38/64]
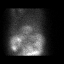
[frame 48/64]
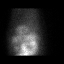
[frame 59/64]
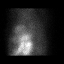

[Series 2: stress gated · 8.28mm/px · 6 of 64 frames shown]
[frame 6/64]
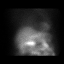
[frame 16/64]
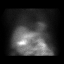
[frame 27/64]
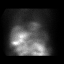
[frame 38/64]
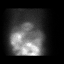
[frame 48/64]
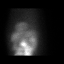
[frame 59/64]
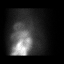

[12 of 12 positions shown; findings below may reference images not displayed]

Canned report from images found in remote index.

Refer to host system for actual result text.

## 2016-02-16 ENCOUNTER — Encounter: Payer: Self-pay | Admitting: Podiatry

## 2016-02-16 ENCOUNTER — Ambulatory Visit (INDEPENDENT_AMBULATORY_CARE_PROVIDER_SITE_OTHER): Payer: Medicare HMO | Admitting: Podiatry

## 2016-02-16 VITALS — BP 103/70 | HR 81 | Resp 18

## 2016-02-16 DIAGNOSIS — E1149 Type 2 diabetes mellitus with other diabetic neurological complication: Secondary | ICD-10-CM

## 2016-02-16 DIAGNOSIS — Q828 Other specified congenital malformations of skin: Secondary | ICD-10-CM | POA: Diagnosis not present

## 2016-02-16 NOTE — Progress Notes (Signed)
Subjective: Mr. Nau presents today for continued care of painful calluses to both of his feet. He states that the left foot is worse than the right although he has neuropathy on the right side. Denies any systemic complaints such as fevers, chills, nausea, vomiting. No acute changes since last appointment, and no other complaints at this time.   Objective: AAO x3, NAD DP/PT pulses palpable bilaterally, CRT less than 3 seconds Bilateral submetatarsal 5 hyperkeratotic lesion. Upon debridement no underlying ulceration, drainage or any signs of infection. On the left side there is a deeper, annular hyperkerotic lesion. There is no evidence of pinpoint bleeding or evidence of verruca. No foreign body identified. No edema, erythema, increase in warmth to bilateral lower extremities.  No open lesions or pre-ulcerative lesions.  No pain with calf compression, swelling, warmth, erythema  Assessment: Porokeratosis bilateral subtarsal 5  Plan: -All treatment options discussed with the patient including all alternatives, risks, complications.  -Lesions debrided 2  -Continue orthotics  -Follow-up in 6 weeks -Patient encouraged to call the office with any questions, concerns, change in symptoms.   Celesta Gentile, DPM

## 2016-03-15 ENCOUNTER — Encounter: Payer: Self-pay | Admitting: Family Medicine

## 2016-03-15 ENCOUNTER — Ambulatory Visit (INDEPENDENT_AMBULATORY_CARE_PROVIDER_SITE_OTHER): Payer: Medicare HMO | Admitting: Family Medicine

## 2016-03-15 VITALS — BP 117/74 | HR 81 | Temp 97.2°F | Ht 73.0 in | Wt 270.1 lb

## 2016-03-15 DIAGNOSIS — E785 Hyperlipidemia, unspecified: Secondary | ICD-10-CM

## 2016-03-15 DIAGNOSIS — G8929 Other chronic pain: Secondary | ICD-10-CM

## 2016-03-15 DIAGNOSIS — E1165 Type 2 diabetes mellitus with hyperglycemia: Secondary | ICD-10-CM

## 2016-03-15 DIAGNOSIS — M79671 Pain in right foot: Secondary | ICD-10-CM

## 2016-03-15 DIAGNOSIS — I10 Essential (primary) hypertension: Secondary | ICD-10-CM

## 2016-03-15 DIAGNOSIS — IMO0001 Reserved for inherently not codable concepts without codable children: Secondary | ICD-10-CM

## 2016-03-15 LAB — BAYER DCA HB A1C WAIVED: HB A1C (BAYER DCA - WAIVED): 8.4 % — ABNORMAL HIGH (ref <7.0)

## 2016-03-15 MED ORDER — OXYCODONE HCL 10 MG PO TABS: 10.0000 mg | ORAL_TABLET | Freq: Four times a day (QID) | ORAL | 0 refills | Status: DC | PRN

## 2016-03-15 MED ORDER — SIMVASTATIN 20 MG PO TABS: 20.0000 mg | ORAL_TABLET | Freq: Every day | ORAL | 3 refills | Status: DC

## 2016-03-15 NOTE — Progress Notes (Signed)
BP 117/74   Pulse 81   Temp 97.2 F (36.2 C) (Oral)   Ht 6' 1"  (1.854 m)   Wt 270 lb 2 oz (122.5 kg)   BMI 35.64 kg/m    Subjective:    Patient ID: Timothy Bouche., male    DOB: 03/26/1965, 51 y.o.   MRN: [id number]  HPI: Timothy Foss. is a 51 y.o. male presenting on 03/15/2016 for Diabetes (followup; patient is fasting); Hyperlipidemia; Hypertension; and Medication Refill (Oxycodone)   HPI Hypertension recheck Patient is coming for hypertension recheck. His blood pressure today is 117/74. He is currently on lisinopril and denies any lightheadedness or dizziness. Patient denies headaches, blurred vision, chest pains, shortness of breath, or weakness. Denies any side effects from medication and is content with current medication.   Type 2 diabetes mellitus Patient comes in today for recheck of his diabetes. Patient has been currently taking Jardiance and glipizide and metformin. Patient is currently on an ACE inhibitor. Patient has seen an ophthalmologist this year. Patient denies any new issues with his feet but does have significant deformity from a previous injury, sees a podiatrist for this regularly.   Hyperlipidemia recheck Patient is coming in for cholesterol recheck. Patient is currently on simvastatin, he denies any myalgias.  Chronic foot pain from deformity Patient is coming in for chronic foot pain from deformity and needs refill on his medications. He says most the time and is pretty good, occasionally uses an extra pill but he does make them last through the month.  Relevant past medical, surgical, family and social history reviewed and updated as indicated. Interim medical history since our last visit reviewed. Allergies and medications reviewed and updated.  Review of Systems  Constitutional: Negative for chills and fever.  HENT: Negative for ear pain and tinnitus.   Eyes: Negative for pain and discharge.  Respiratory: Negative for cough, shortness of breath  and wheezing.   Cardiovascular: Negative for chest pain, palpitations and leg swelling.  Genitourinary: Negative for dysuria and hematuria.  Musculoskeletal: Positive for arthralgias. Negative for back pain, gait problem and myalgias.  Skin: Negative for rash.  Neurological: Negative for dizziness, weakness and headaches.  Psychiatric/Behavioral: Negative for suicidal ideas.  All other systems reviewed and are negative.   Per HPI unless specifically indicated above   Allergies as of 03/15/2016      Reactions   Fenofibrate Rash   Permament rash.      Medication List       Accurate as of 03/15/16  8:23 AM. Always use your most recent med list.          accu-chek softclix lancets Use as instructed   CINNAMON PO Take 1 capsule by mouth 2 (two) times daily.   empagliflozin 10 MG Tabs tablet Commonly known as:  JARDIANCE Take 10 mg by mouth daily.   fluticasone 50 MCG/ACT nasal spray Commonly known as:  FLONASE Place 1 spray into both nostrils 2 (two) times daily as needed for allergies or rhinitis.   glipiZIDE 10 MG 24 hr tablet Commonly known as:  GLUCOTROL XL Take 1 tablet (10 mg total) by mouth daily with breakfast.   glucose blood test strip Commonly known as:  ACCU-CHEK AVIVA Use as instructed   lisinopril 20 MG tablet Commonly known as:  PRINIVIL,ZESTRIL TAKE 1 TABLET (20 MG TOTAL) BY MOUTH DAILY.   metFORMIN 1000 MG tablet Commonly known as:  GLUCOPHAGE Take 1 tablet (1,000 mg total) by mouth 2 (two) times daily  with a meal.   metoCLOPramide 10 MG tablet Commonly known as:  REGLAN Take 1 tablet (10 mg total) by mouth 3 (three) times daily before meals.   Oxycodone HCl 10 MG Tabs Take 1 tablet (10 mg total) by mouth 4 (four) times daily as needed. Do not fill until 30 days from prescription date   Oxycodone HCl 10 MG Tabs Take 1 tablet (10 mg total) by mouth 4 (four) times daily as needed.   Oxycodone HCl 10 MG Tabs Take 1 tablet (10 mg total) by  mouth 4 (four) times daily as needed.   simvastatin 20 MG tablet Commonly known as:  ZOCOR Take 1 tablet (20 mg total) by mouth daily at 6 PM.          Objective:    BP 117/74   Pulse 81   Temp 97.2 F (36.2 C) (Oral)   Ht 6' 1"  (1.854 m)   Wt 270 lb 2 oz (122.5 kg)   BMI 35.64 kg/m   Wt Readings from Last 3 Encounters:  03/15/16 270 lb 2 oz (122.5 kg)  01/29/16 273 lb (123.8 kg)  01/15/16 271 lb 6 oz (123.1 kg)    Physical Exam  Constitutional: He is oriented to person, place, and time. He appears well-developed and well-nourished. No distress.  Eyes: Conjunctivae are normal. No scleral icterus.  Neck: Neck supple.  Cardiovascular: Normal rate, regular rhythm, normal heart sounds and intact distal pulses.   No murmur heard. Pulmonary/Chest: Effort normal and breath sounds normal. No respiratory distress. He has no wheezes.  Musculoskeletal: Normal range of motion. He exhibits tenderness (Bilateral chronic ankle tenderness and feet calluses because of deformities). He exhibits no edema.  Lymphadenopathy:    He has no cervical adenopathy.  Neurological: He is alert and oriented to person, place, and time. Coordination normal.  Skin: Skin is warm and dry. No rash noted. He is not diaphoretic.  Psychiatric: He has a normal mood and affect. His behavior is normal.  Nursing note and vitals reviewed.   Results for orders placed or performed in visit on 02/26/16  HM DIABETES EYE EXAM  Result Value Ref Range   HM Diabetic Eye Exam No Retinopathy No Retinopathy      Assessment & Plan:   Problem List Items Addressed This Visit      Cardiovascular and Mediastinum   Essential hypertension, benign   Relevant Medications   simvastatin (ZOCOR) 20 MG tablet   Other Relevant Orders   CMP14+EGFR     Endocrine   Diabetes type 2, uncontrolled (Timothy Nelson)   Relevant Medications   simvastatin (ZOCOR) 20 MG tablet   Other Relevant Orders   CMP14+EGFR   Bayer DCA Hb A1c Waived      Other   Hyperlipidemia LDL goal <100 - Primary   Relevant Medications   simvastatin (ZOCOR) 20 MG tablet   Other Relevant Orders   Lipid panel   Chronic foot pain   Relevant Medications   Oxycodone HCl 10 MG TABS   Oxycodone HCl 10 MG TABS       Follow up plan: Return in about 3 months (around 06/15/2016), or if symptoms worsen or fail to improve, for Diabetes and hypertension and cholesterol.  Counseling provided for all of the vaccine components Orders Placed This Encounter  Procedures  . Lipid panel  . CMP14+EGFR  . Bayer [facility] Hb A1c [place], MD [facility] 03/15/2016, 8:23 AM

## 2016-03-16 LAB — LIPID PANEL
Chol/HDL Ratio: 4.1 ratio units (ref 0.0–5.0)
Cholesterol, Total: 154 mg/dL (ref 100–199)
HDL: 38 mg/dL — ABNORMAL LOW (ref >39)
LDL Calculated: 46 mg/dL (ref 0–99)
Triglycerides: 352 mg/dL — ABNORMAL HIGH (ref 0–149)
VLDL Cholesterol Cal: 70 mg/dL — ABNORMAL HIGH (ref 5–40)

## 2016-03-16 LAB — CMP14+EGFR
ALT: 39 IU/L (ref 0–44)
AST: 27 IU/L (ref 0–40)
Albumin/Globulin Ratio: 1.7 (ref 1.2–2.2)
Albumin: 4.3 g/dL (ref 3.5–5.5)
Alkaline Phosphatase: 85 IU/L (ref 39–117)
BUN/Creatinine Ratio: 13 (ref 9–20)
BUN: 14 mg/dL (ref 6–24)
Bilirubin Total: 0.3 mg/dL (ref 0.0–1.2)
CO2: 22 mmol/L (ref 18–29)
Calcium: 9.2 mg/dL (ref 8.7–10.2)
Chloride: 97 mmol/L (ref 96–106)
Creatinine, Ser: 1.05 mg/dL (ref 0.76–1.27)
GFR calc Af Amer: 95 mL/min/{1.73_m2} (ref >59)
GFR calc non Af Amer: 82 mL/min/{1.73_m2} (ref >59)
Globulin, Total: 2.5 g/dL (ref 1.5–4.5)
Glucose: 159 mg/dL — ABNORMAL HIGH (ref 65–99)
Potassium: 4.7 mmol/L (ref 3.5–5.2)
Sodium: 138 mmol/L (ref 134–144)
Total Protein: 6.8 g/dL (ref 6.0–8.5)

## 2016-03-19 ENCOUNTER — Other Ambulatory Visit: Payer: Self-pay | Admitting: *Deleted

## 2016-03-19 MED ORDER — EMPAGLIFLOZIN 25 MG PO TABS: 25.0000 mg | ORAL_TABLET | Freq: Every day | ORAL | 0 refills | Status: DC

## 2016-03-22 ENCOUNTER — Other Ambulatory Visit: Payer: Self-pay | Admitting: *Deleted

## 2016-03-23 ENCOUNTER — Other Ambulatory Visit: Payer: Self-pay | Admitting: Family Medicine

## 2016-03-23 DIAGNOSIS — E1165 Type 2 diabetes mellitus with hyperglycemia: Principal | ICD-10-CM

## 2016-03-23 DIAGNOSIS — IMO0001 Reserved for inherently not codable concepts without codable children: Secondary | ICD-10-CM

## 2016-03-29 ENCOUNTER — Ambulatory Visit: Payer: Medicare HMO | Admitting: Podiatry

## 2016-03-31 ENCOUNTER — Ambulatory Visit (INDEPENDENT_AMBULATORY_CARE_PROVIDER_SITE_OTHER): Payer: Medicare HMO | Admitting: Podiatry

## 2016-03-31 DIAGNOSIS — Q828 Other specified congenital malformations of skin: Secondary | ICD-10-CM

## 2016-03-31 DIAGNOSIS — E1149 Type 2 diabetes mellitus with other diabetic neurological complication: Secondary | ICD-10-CM | POA: Diagnosis not present

## 2016-03-31 NOTE — Progress Notes (Signed)
Subjective: Timothy Nelson presents today for continued care of painful calluses to both of his feet. He states that the left foot is worse than the right although he has neuropathy on the right side. Denies any systemic complaints such as fevers, chills, nausea, vomiting. No acute changes since last appointment, and no other complaints at this time.   Objective: AAO x3, NAD DP/PT pulses palpable bilaterally, CRT less than 3 seconds Bilateral submetatarsal 5 hyperkeratotic lesion. Upon debridement no underlying ulceration, drainage or any signs of infection. On the left side there is a deeper, annular hyperkerotic lesion. There is no evidence of pinpoint bleeding or evidence of verruca. No foreign body identified. The right side appears to be more pre-ulcerative today.  No edema, erythema, increase in warmth to bilateral lower extremities.  No open lesions or pre-ulcerative lesions.  No pain with calf compression, swelling, warmth, erythema  Assessment: Porokeratosis bilateral subtarsal 5  Plan: -All treatment options discussed with the patient including all alternatives, risks, complications.  -Lesions debrided 2 ; offloading pads dispensed.  -Continue orthotics  -Follow-up in 6 weeks -Patient encouraged to call the office with any questions, concerns, change in symptoms.   Celesta Gentile, DPM

## 2016-05-13 ENCOUNTER — Ambulatory Visit (INDEPENDENT_AMBULATORY_CARE_PROVIDER_SITE_OTHER): Payer: Medicare HMO | Admitting: Podiatry

## 2016-05-13 DIAGNOSIS — M79671 Pain in right foot: Secondary | ICD-10-CM | POA: Diagnosis not present

## 2016-05-13 DIAGNOSIS — Q828 Other specified congenital malformations of skin: Secondary | ICD-10-CM

## 2016-05-13 DIAGNOSIS — M79672 Pain in left foot: Secondary | ICD-10-CM | POA: Diagnosis not present

## 2016-05-13 NOTE — Progress Notes (Signed)
Subjective: Mr. Baskette presents today for continued care of painful calluses to both of his feet. He states that the left foot is worse than the right although he has neuropathy on the right side. Denies any systemic complaints such as fevers, chills, nausea, vomiting. No acute changes since last appointment, and no other complaints at this time. He has no new concerns today.   Objective: AAO x3, NAD DP/PT pulses palpable bilaterally, CRT less than 3 seconds Bilateral submetatarsal 5 hyperkeratotic lesion. Upon debridement no underlying ulceration, drainage or any signs of infection.  On the left side there is a deeper, annular hyperkerotic lesion. There is no evidence of pinpoint bleeding or evidence of verruca. No foreign body identified. The right side appears to be more pre-ulcerative today.  No edema, erythema, increase in warmth to bilateral lower extremities.  No open lesions or pre-ulcerative lesions.  No pain with calf compression, swelling, warmth, erythema  Assessment: Porokeratosis bilateral subtarsal 5  Plan: -All treatment options discussed with the patient including all alternatives, risks, complications.  -Lesions debrided 2; continue offloading pads.On the left side the area was cleaned and a pad was placed followed by salinocaine and a bandage. Post-procedure instructions discussed. Monitor for infection.  -Continue orthotics  -Follow-up in 6 weeks -Patient encouraged to call the office with any questions, concerns, change in symptoms.   Celesta Gentile, DPM

## 2016-05-14 ENCOUNTER — Ambulatory Visit: Payer: Medicare HMO | Admitting: Podiatry

## 2016-05-18 ENCOUNTER — Ambulatory Visit (INDEPENDENT_AMBULATORY_CARE_PROVIDER_SITE_OTHER): Payer: Commercial Managed Care - HMO | Admitting: Internal Medicine

## 2016-05-18 ENCOUNTER — Encounter (INDEPENDENT_AMBULATORY_CARE_PROVIDER_SITE_OTHER): Payer: Self-pay | Admitting: Internal Medicine

## 2016-05-27 ENCOUNTER — Encounter: Payer: Self-pay | Admitting: Family Medicine

## 2016-05-27 ENCOUNTER — Ambulatory Visit (INDEPENDENT_AMBULATORY_CARE_PROVIDER_SITE_OTHER): Payer: Medicare HMO | Admitting: Family Medicine

## 2016-05-27 VITALS — BP 128/83 | HR 78 | Temp 99.0°F | Ht 73.0 in | Wt 262.0 lb

## 2016-05-27 DIAGNOSIS — L03314 Cellulitis of groin: Secondary | ICD-10-CM

## 2016-05-27 MED ORDER — SULFAMETHOXAZOLE-TRIMETHOPRIM 800-160 MG PO TABS: 1.0000 | ORAL_TABLET | Freq: Two times a day (BID) | ORAL | 0 refills | Status: DC

## 2016-05-27 NOTE — Progress Notes (Signed)
   BP 128/83   Pulse 78   Temp 99 F (37.2 C) (Oral)   Ht 6\' 1"  (1.854 m)   Wt 262 lb (118.8 kg)   BMI 34.57 kg/m    Subjective:    Patient ID: Timothy Bouche., male    DOB: Apr 20, 1965, 51 y.o.   MRN: [id number]  HPI: Timothy Erven. is a 51 y.o. male presenting on 05/27/2016 for Recurrent Skin Infections (groin area, right side)   HPI Right groin skin infection Patient has been having a right groin skin infection is going on for the past few days. His blood sugars have been running very high because of it. He has become very swollen and painful in the area. He denies any fevers or chills or redness or warmth or anywhere else. The spot has been bulging and increasing it catches on his waistband. He has not gotten any drainage out of it just yet.  Relevant past medical, surgical, family and social history reviewed and updated as indicated. Interim medical history since our last visit reviewed. Allergies and medications reviewed and updated.  Review of Systems  Constitutional: Negative for chills and fever.  Respiratory: Negative for shortness of breath and wheezing.   Cardiovascular: Negative for chest pain and leg swelling.  Musculoskeletal: Negative for back pain and gait problem.  Skin: Positive for color change. Negative for rash.  All other systems reviewed and are negative.  Per HPI unless specifically indicated above     Objective:    BP 128/83   Pulse 78   Temp 99 F (37.2 C) (Oral)   Ht 6\' 1"  (1.854 m)   Wt 262 lb (118.8 kg)   BMI 34.57 kg/m   Wt Readings from Last 3 Encounters:  05/27/16 262 lb (118.8 kg)  03/15/16 270 lb 2 oz (122.5 kg)  01/29/16 273 lb (123.8 kg)    Physical Exam  Constitutional: He is oriented to person, place, and time. He appears well-developed and well-nourished. No distress.  Eyes: Conjunctivae are normal. No scleral icterus.  Musculoskeletal: Normal range of motion.  Neurological: He is alert and oriented to person, place, and  time.  Skin: Skin is warm and dry. Lesion (3 x 1 cm abscess in right inguinal region.) noted. No rash noted. He is not diaphoretic. There is erythema.  Psychiatric: He has a normal mood and affect. His behavior is normal.  Nursing note and vitals reviewed.  I&D: Region was anesthetized with 2 % lidocaine using about 5 mL of it. Incision was made on anterior medial aspect of the open wound. Significant serosanguineous and purulent drainage was exuded. Culture was taken. Probe was used to probe the area and break apart any loculations. 4 x 4 dressing with tape was placed over top. Bleeding was minimal and patient tolerated procedure well.      Assessment & Plan:   Problem List Items Addressed This Visit    None    Visit Diagnoses    Cellulitis of groin    -  Primary   Relevant Medications   sulfamethoxazole-trimethoprim (BACTRIM DS,SEPTRA DS) 800-160 MG tablet   Other Relevant Orders   Anaerobic and Aerobic Culture      Follow up plan: Return if symptoms worsen or fail to improve.  Counseling provided for all of the vaccine components No orders of the defined types were placed in this encounter.   Caryl Pina, MD [facility] 05/27/2016, 5:20 PM

## 2016-06-03 LAB — ANAEROBIC AND AEROBIC CULTURE: Result 1: NEGATIVE — AB

## 2016-06-11 ENCOUNTER — Encounter: Payer: Self-pay | Admitting: Podiatry

## 2016-06-11 ENCOUNTER — Ambulatory Visit (INDEPENDENT_AMBULATORY_CARE_PROVIDER_SITE_OTHER): Payer: Medicare HMO | Admitting: Podiatry

## 2016-06-11 DIAGNOSIS — Q828 Other specified congenital malformations of skin: Secondary | ICD-10-CM | POA: Diagnosis not present

## 2016-06-11 DIAGNOSIS — E1149 Type 2 diabetes mellitus with other diabetic neurological complication: Secondary | ICD-10-CM | POA: Diagnosis not present

## 2016-06-11 NOTE — Progress Notes (Signed)
Subjective: Timothy Nelson presents today for continued care of painful calluses to both of his feet. He states that the left foot is worse than the right although he has neuropathy on the right side. Denies any systemic complaints such as fevers, chills, nausea, vomiting. No acute changes since last appointment, and no other complaints at this time. He has no new concerns today.   Objective: AAO x3, NAD DP/PT pulses palpable bilaterally, CRT less than 3 seconds Bilateral submetatarsal 5 hyperkeratotic lesion. Upon debridement no underlying ulceration, drainage or any signs of infection.  On the left side there is a deeper, annular hyperkerotic lesion. There is no evidence of pinpoint bleeding or evidence of verruca. No foreign body identified. The right side appears to be more pre-ulcerative today.  No edema, erythema, increase in warmth to bilateral lower extremities.  No open lesions or pre-ulcerative lesions.  No pain with calf compression, swelling, warmth, erythema  Assessment: Porokeratosis bilateral subtarsal 5  Plan: -All treatment options discussed with the patient including all alternatives, risks, complications.  -Lesions debrided 2 without any complications or bleeding; continue offloading pads. -Follow-up in 6 weeks -Patient encouraged to call the office with any questions, concerns, change in symptoms.   Celesta Gentile, DPM

## 2016-06-16 ENCOUNTER — Encounter: Payer: Self-pay | Admitting: Family Medicine

## 2016-06-16 ENCOUNTER — Ambulatory Visit (INDEPENDENT_AMBULATORY_CARE_PROVIDER_SITE_OTHER): Payer: Medicare HMO | Admitting: Family Medicine

## 2016-06-16 VITALS — BP 121/76 | HR 83 | Temp 97.2°F | Ht 73.0 in | Wt 270.0 lb

## 2016-06-16 DIAGNOSIS — I10 Essential (primary) hypertension: Secondary | ICD-10-CM

## 2016-06-16 DIAGNOSIS — M79671 Pain in right foot: Secondary | ICD-10-CM

## 2016-06-16 DIAGNOSIS — E1165 Type 2 diabetes mellitus with hyperglycemia: Secondary | ICD-10-CM

## 2016-06-16 DIAGNOSIS — E785 Hyperlipidemia, unspecified: Secondary | ICD-10-CM | POA: Diagnosis not present

## 2016-06-16 DIAGNOSIS — IMO0001 Reserved for inherently not codable concepts without codable children: Secondary | ICD-10-CM

## 2016-06-16 DIAGNOSIS — G8929 Other chronic pain: Secondary | ICD-10-CM | POA: Diagnosis not present

## 2016-06-16 LAB — BAYER DCA HB A1C WAIVED: HB A1C (BAYER DCA - WAIVED): 8.3 % — ABNORMAL HIGH (ref <7.0)

## 2016-06-16 MED ORDER — OXYCODONE HCL 10 MG PO TABS: 10.0000 mg | ORAL_TABLET | Freq: Four times a day (QID) | ORAL | 0 refills | Status: DC | PRN

## 2016-06-16 NOTE — Progress Notes (Signed)
BP 121/76   Pulse 83   Temp 97.2 F (36.2 C) (Oral)   Ht 6\' 1"  (1.854 m)   Wt 270 lb (122.5 kg)   BMI 35.62 kg/m    Subjective:    Patient ID: Timothy Nelson., male    DOB: October 16, 1965, 51 y.o.   MRN: [id number]  HPI: Timothy Nelson. is a 51 y.o. male presenting on 06/16/2016 for Diabetes (3 mo; patient not fasting; no longer getting assistance for Jardiance, too expensive $400); Hyperlipidemia; and Hypertension   HPI Type 2 diabetes mellitus Patient comes in today for recheck of his diabetes. Patient has been currently taking Metformin and glipizide and Jardiance. Patient is currently on an ACE inhibitor. Patient has not seen an ophthalmologist this year. Patient denies any neuropathy but does have chronic pain in his feet from deformities from fractures he sustained in both ankles but did not heal appropriately, he sees podiatry on a regular basis for management of his feet.   Hypertension Patient is currently on lisinopril, and her blood pressure today is 120/76. Patient denies any lightheadedness or dizziness. Patient denies headaches, blurred vision, chest pains, shortness of breath, or weakness. Denies any side effects from medication and is content with current medication.   Hyperlipidemia Patient is coming in for recheck of his hyperlipidemia. He is currently taking Simvastatin. He denies any issues with myalgias or history of liver damage from it. He denies any focal numbness or weakness or chest pain.   Chronic pain refill Patient is coming in for refills medication for his chronic foot pain in both feet from fractures and wrist surgeries and deformity that he has. He sees a podiatrist to try and help with this. He has been stable on his medications and says they're working well for him at the current dose. He does admit that he uses marijuana but denies any other drugs and only use marijuana once a month.  Relevant past medical, surgical, family and social history reviewed  and updated as indicated. Interim medical history since our last visit reviewed. Allergies and medications reviewed and updated.  Review of Systems  Constitutional: Negative for chills and fever.  Eyes: Negative for discharge.  Respiratory: Negative for shortness of breath and wheezing.   Cardiovascular: Negative for chest pain and leg swelling.  Genitourinary: Negative for difficulty urinating.  Musculoskeletal: Positive for arthralgias. Negative for back pain, gait problem and joint swelling.  Skin: Negative for rash.  Neurological: Negative for dizziness, weakness, light-headedness and numbness.  All other systems reviewed and are negative.   Per HPI unless specifically indicated above   Allergies as of 06/16/2016      Reactions   Fenofibrate Rash   Permament rash.      Medication List       Accurate as of 06/16/16  8:15 AM. Always use your most recent med list.          accu-chek softclix lancets Use as instructed   CINNAMON PO Take 1 capsule by mouth 2 (two) times daily.   empagliflozin 25 MG Tabs tablet Commonly known as:  JARDIANCE Take 25 mg by mouth daily.   fluticasone 50 MCG/ACT nasal spray Commonly known as:  FLONASE Place 1 spray into both nostrils 2 (two) times daily as needed for allergies or rhinitis.   glipiZIDE 10 MG 24 hr tablet Commonly known as:  GLUCOTROL XL Take 1 tablet (10 mg total) by mouth daily with breakfast.   glucose blood test strip Commonly known as:  ACCU-CHEK AVIVA Use as instructed   lisinopril 20 MG tablet Commonly known as:  PRINIVIL,ZESTRIL TAKE 1 TABLET (20 MG TOTAL) BY MOUTH DAILY.   metFORMIN 1000 MG tablet Commonly known as:  GLUCOPHAGE Take 1 tablet (1,000 mg total) by mouth 2 (two) times daily with a meal.   metoCLOPramide 10 MG tablet Commonly known as:  REGLAN Take 1 tablet (10 mg total) by mouth 3 (three) times daily before meals.   Oxycodone HCl 10 MG Tabs Take 1 tablet (10 mg total) by mouth 4 (four)  times daily as needed. Do not fill until 30 days from prescription date   Oxycodone HCl 10 MG Tabs Take 1 tablet (10 mg total) by mouth 4 (four) times daily as needed.   Oxycodone HCl 10 MG Tabs Take 1 tablet (10 mg total) by mouth 4 (four) times daily as needed.   simvastatin 20 MG tablet Commonly known as:  ZOCOR Take 1 tablet (20 mg total) by mouth daily at 6 PM.          Objective:    BP 121/76   Pulse 83   Temp 97.2 F (36.2 C) (Oral)   Ht 6\' 1"  (1.854 m)   Wt 270 lb (122.5 kg)   BMI 35.62 kg/m   Wt Readings from Last 3 Encounters:  06/16/16 270 lb (122.5 kg)  05/27/16 262 lb (118.8 kg)  03/15/16 270 lb 2 oz (122.5 kg)    Physical Exam  Constitutional: He is oriented to person, place, and time. He appears well-developed and well-nourished. No distress.  Eyes: Conjunctivae are normal. No scleral icterus.  Neck: Neck supple. No thyromegaly present.  Cardiovascular: Normal rate, regular rhythm, normal heart sounds and intact distal pulses.   No murmur heard. Pulmonary/Chest: Effort normal and breath sounds normal. No respiratory distress. He has no wheezes. He has no rales.  Musculoskeletal: Normal range of motion. He exhibits no edema.  Lymphadenopathy:    He has no cervical adenopathy.  Neurological: He is alert and oriented to person, place, and time. Coordination normal.  Skin: Skin is warm and dry. No rash noted. He is not diaphoretic.  Psychiatric: He has a normal mood and affect. His behavior is normal.  Nursing note and vitals reviewed.       Assessment & Plan:   Problem List Items Addressed This Visit      Cardiovascular and Mediastinum   Essential hypertension, benign     Endocrine   Diabetes type 2, uncontrolled ([place]) - Primary   Relevant Orders   Bayer DCA Hb A1c Waived (Completed)     Other   Hyperlipidemia LDL goal <100   Chronic foot pain   Relevant Medications   Oxycodone HCl 10 MG TABS   Oxycodone HCl 10 MG TABS   Oxycodone HCl 10  MG TABS       Follow up plan: Return in about 3 months (around 09/16/2016), or if symptoms worsen or fail to improve, for Recheck diabetes and hypertension pain management.  Counseling provided for all of the vaccine components Orders Placed This Encounter  Procedures  . Bayer [facility] Hb A1c [place], MD [facility] 06/16/2016, 8:15 AM

## 2016-07-23 ENCOUNTER — Ambulatory Visit (INDEPENDENT_AMBULATORY_CARE_PROVIDER_SITE_OTHER): Payer: Medicare HMO | Admitting: Podiatry

## 2016-07-23 ENCOUNTER — Encounter: Payer: Self-pay | Admitting: Podiatry

## 2016-07-23 DIAGNOSIS — Q828 Other specified congenital malformations of skin: Secondary | ICD-10-CM | POA: Diagnosis not present

## 2016-07-23 DIAGNOSIS — M79672 Pain in left foot: Secondary | ICD-10-CM

## 2016-07-23 DIAGNOSIS — M79671 Pain in right foot: Secondary | ICD-10-CM

## 2016-07-23 NOTE — Progress Notes (Signed)
Subjective: Mr. Drudge presents today for continued care of painful calluses to both of his feet. He states that the left foot is worse than the right although he has neuropathy on the right side. Denies any systemic complaints such as fevers, chills, nausea, vomiting. No acute changes since last appointment, and no other complaints at this time. He has no new concerns today.   Objective: AAO x3, NAD DP/PT pulses palpable bilaterally, CRT less than 3 seconds Bilateral submetatarsal 5 hyperkeratotic lesion. Upon debridement no underlying ulceration, drainage or any signs of infection.  On the left side there is a deeper, annular hyperkerotic lesion. There is no evidence of pinpoint bleeding or evidence of verruca. No foreign body identified. The right side appears to be more pre-ulcerative today.  No edema, erythema, increase in warmth to bilateral lower extremities.  No open lesions or pre-ulcerative lesions.  No pain with calf compression, swelling, warmth, erythema  Assessment: Porokeratosis bilateral subtarsal 5  Plan: -All treatment options discussed with the patient including all alternatives, risks, complications.  -Lesions debrided 2 without any complications or bleeding; continue offloading pads. A pad was placed around the lesion on the left side followed by salicylic acid and a bandage. Post procedure tractions were discussed. Monitor for infection. -Follow-up in 6 weeks -Patient encouraged to call the office with any questions, concerns, change in symptoms.   Celesta Gentile, DPM

## 2016-09-02 ENCOUNTER — Encounter: Payer: Self-pay | Admitting: Podiatry

## 2016-09-02 ENCOUNTER — Ambulatory Visit (INDEPENDENT_AMBULATORY_CARE_PROVIDER_SITE_OTHER): Payer: Medicare HMO | Admitting: Podiatry

## 2016-09-02 DIAGNOSIS — E1149 Type 2 diabetes mellitus with other diabetic neurological complication: Secondary | ICD-10-CM | POA: Diagnosis not present

## 2016-09-02 DIAGNOSIS — M2041 Other hammer toe(s) (acquired), right foot: Secondary | ICD-10-CM

## 2016-09-02 DIAGNOSIS — M216X9 Other acquired deformities of unspecified foot: Secondary | ICD-10-CM

## 2016-09-02 DIAGNOSIS — M2042 Other hammer toe(s) (acquired), left foot: Secondary | ICD-10-CM

## 2016-09-02 DIAGNOSIS — Q828 Other specified congenital malformations of skin: Secondary | ICD-10-CM

## 2016-09-02 NOTE — Progress Notes (Signed)
Subjective: Timothy Nelson, 51 year old male, presents today for continued care of painful calluses to both of his feet.He has tried shoes and custom inserts previously and the calluses continue to come back and get thick and painful. Denies any systemic complaints such as fevers, chills, nausea, vomiting. He is also requesting diabetic shoes.No acute changes since last appointment, and no other complaints at this time. He has no new concerns today.   Objective: AAO x3, NAD DP/PT pulses palpable bilaterally, CRT less than 3 seconds Sensation decreased with SWMF Bilateral submetatarsal 5 hyperkeratotic lesion. Upon debridement no underlying ulceration, drainage or any signs of infection.  On the left side there is a deeper, annular hyperkerotic lesion. There is no evidence of pinpoint bleeding or evidence of verruca. No foreign body identified. The right side appears to be more pre-ulcerative today.  Thick callus to the plantar right heel. No underlying ulceration, drainage or signs of infection.  Prominent metatarsal heads bilaterally. Hammertoes present No edema, erythema, increase in warmth to bilateral lower extremities.  No open lesions or pre-ulcerative lesions.  No pain with calf compression, swelling, warmth, erythema  Assessment: Porokeratosis bilateral subtarsal 5 and right heel; type II diabetes mellitus with neuropathy   Plan: -All treatment options discussed with the patient including all alternatives, risks, complications.  -Lesions debrided 3 without any complications or bleeding; continue offloading pads. -Paperwork completed for diabetic shoes today. Given pre-ulcerative callus and digitial deformity and nueropathy I think he would benefit to help prevent ulceration.  -Follow-up in 6 weeks at his request or sooner if needed. Will also schedule with Rick/Betha for diabetic shoes.  -Patient encouraged to call the office with any questions, concerns, change in symptoms.   Celesta Gentile, DPM

## 2016-09-03 ENCOUNTER — Encounter: Payer: Self-pay | Admitting: Podiatry

## 2016-09-09 ENCOUNTER — Ambulatory Visit: Payer: Medicare HMO | Admitting: Orthotics

## 2016-09-09 DIAGNOSIS — E1149 Type 2 diabetes mellitus with other diabetic neurological complication: Secondary | ICD-10-CM

## 2016-09-09 DIAGNOSIS — M216X9 Other acquired deformities of unspecified foot: Secondary | ICD-10-CM

## 2016-09-09 DIAGNOSIS — Q828 Other specified congenital malformations of skin: Secondary | ICD-10-CM

## 2016-09-09 NOTE — Progress Notes (Signed)
Patient presents today for diabetic shoe measurement and foam casting.  Goals of diabetic shoes/inserts to offer protection from conditions secondary to DM2, offer relief from sheer forces that could lead to ulcerations, protect the foot, and offer greater stability. 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

## 2016-09-15 ENCOUNTER — Other Ambulatory Visit: Payer: Self-pay | Admitting: Family Medicine

## 2016-09-16 ENCOUNTER — Encounter: Payer: Self-pay | Admitting: Family Medicine

## 2016-09-16 ENCOUNTER — Ambulatory Visit (INDEPENDENT_AMBULATORY_CARE_PROVIDER_SITE_OTHER): Payer: Medicare HMO | Admitting: Family Medicine

## 2016-09-16 VITALS — BP 113/72 | HR 81 | Temp 98.0°F | Ht 73.0 in | Wt 266.0 lb

## 2016-09-16 DIAGNOSIS — E1165 Type 2 diabetes mellitus with hyperglycemia: Secondary | ICD-10-CM

## 2016-09-16 DIAGNOSIS — Z23 Encounter for immunization: Secondary | ICD-10-CM

## 2016-09-16 DIAGNOSIS — G8929 Other chronic pain: Secondary | ICD-10-CM

## 2016-09-16 DIAGNOSIS — E785 Hyperlipidemia, unspecified: Secondary | ICD-10-CM

## 2016-09-16 DIAGNOSIS — E1169 Type 2 diabetes mellitus with other specified complication: Secondary | ICD-10-CM

## 2016-09-16 DIAGNOSIS — I10 Essential (primary) hypertension: Secondary | ICD-10-CM

## 2016-09-16 DIAGNOSIS — M79671 Pain in right foot: Secondary | ICD-10-CM

## 2016-09-16 DIAGNOSIS — E1159 Type 2 diabetes mellitus with other circulatory complications: Secondary | ICD-10-CM

## 2016-09-16 DIAGNOSIS — R5383 Other fatigue: Secondary | ICD-10-CM

## 2016-09-16 DIAGNOSIS — K219 Gastro-esophageal reflux disease without esophagitis: Secondary | ICD-10-CM

## 2016-09-16 DIAGNOSIS — IMO0001 Reserved for inherently not codable concepts without codable children: Secondary | ICD-10-CM

## 2016-09-16 LAB — BAYER DCA HB A1C WAIVED: HB A1C (BAYER DCA - WAIVED): 7.7 % — ABNORMAL HIGH (ref <7.0)

## 2016-09-16 MED ORDER — OXYCODONE HCL 10 MG PO TABS: 10.0000 mg | ORAL_TABLET | Freq: Four times a day (QID) | ORAL | 0 refills | Status: DC | PRN

## 2016-09-16 NOTE — Progress Notes (Signed)
BP 113/72   Pulse 81   Temp 98 F (36.7 C) (Oral)   Ht 6' 1"  (1.854 m)   Wt 266 lb (120.7 kg)   BMI 35.09 kg/m    Subjective:    Patient ID: Timothy Nelson., male    DOB: [birth year], 51 y.o.   MRN: [id number]  HPI: Timothy Nelson. is a 51 y.o. male presenting on 09/16/2016 for Diabetes (followup; patient is fasting); Hyperlipidemia; and Hypertension   HPI Type 2 diabetes mellitus Patient comes in today for recheck of his diabetes. Patient has been currently taking Jardiance and glipizide and metformin, he says is been trying harder to keep his diabetes under better control. Patient is currently on an ACE inhibitor/ARB. Patient has not seen an ophthalmologist this year. Patient denies any new issues with their feet, patient has no foot deformities and because of that sees a podiatrist every 6 weeks to work on abnormal calluses on his feet.   Hypertension Patient is currently on lisinopril, and their blood pressure today is 111/72. Patient denies any lightheadedness or dizziness. Patient denies headaches, blurred vision, chest pains, shortness of breath, or weakness. Denies any side effects from medication and is content with current medication.   Hyperlipidemia Patient is coming in for recheck of his hyperlipidemia. The patient is currently taking simvastatin. They deny any issues with myalgias or history of liver damage from it. They deny any focal numbness or weakness or chest pain.   Chronic foot pain Patient is coming in for refill of his medications for his chronic foot pain. He has been doing well on his medication and it controls it for the most part. 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  Fatigue and decreased energy Patient complains of fatigue which been going on for the past month that he feels like he just does not have any energy or drive to get up and go. He does not feel like he is sleeping well at night and  wakes up with non-restful sleep. He is concerned about whether his thyroid or testosterone could be an issue. He does admit that he snores at night but does not know if he wakes up gasping for air.  Relevant past medical, surgical, family and social history reviewed and updated as indicated. Interim medical history since our last visit reviewed. Allergies and medications reviewed and updated.  Review of Systems  Constitutional: Positive for fatigue. Negative for chills and fever.  Eyes: Negative for discharge.  Respiratory: Negative for shortness of breath and wheezing.   Cardiovascular: Negative for chest pain and leg swelling.  Musculoskeletal: Positive for arthralgias. Negative for back pain and gait problem.  Skin: Negative for color change and rash.  Neurological: Negative for dizziness, weakness, light-headedness, numbness and headaches.  All other systems reviewed and are negative.   Per HPI unless specifically indicated above     Objective:    BP 113/72   Pulse 81   Temp 98 F (36.7 C) (Oral)   Ht 6' 1"  (1.854 m)   Wt 266 lb (120.7 kg)   BMI 35.09 kg/m   Wt Readings from Last 3 Encounters:  09/16/16 266 lb (120.7 kg)  06/16/16 270 lb (122.5 kg)  05/27/16 262 lb (118.8 kg)    Physical Exam  Constitutional: He is oriented to person, place, and time. He appears well-developed and well-nourished. No distress.  Eyes: Conjunctivae are normal. No scleral icterus.  Neck: Neck supple. No thyromegaly present.  Cardiovascular: Normal rate, regular rhythm, normal heart sounds and intact distal pulses.   No murmur heard. Pulmonary/Chest: Effort normal and breath sounds normal. No respiratory distress. He has no wheezes. He has no rales.  Musculoskeletal: Normal range of motion. He exhibits tenderness (Chronic ankle pain with range of motion, deformity of ankles and decreased range of motion significantly). He exhibits no edema.  Lymphadenopathy:    He has no cervical adenopathy.   Neurological: He is alert and oriented to person, place, and time. Coordination normal.  Skin: Skin is warm and dry. No rash noted. He is not diaphoretic.  Psychiatric: He has a normal mood and affect. His behavior is normal.  Nursing note and vitals reviewed.   Results for orders placed or performed in visit on 09/16/16  Bayer DCA Hb A1c Waived  Result Value Ref Range   Bayer DCA Hb A1c Waived 7.7 (H) <7.0 %      Assessment & Plan:   Problem List Items Addressed This Visit      Cardiovascular and Mediastinum   Hypertension associated with diabetes ([place])     Digestive   GERD (gastroesophageal reflux disease) - Primary     Endocrine   Diabetes type 2, uncontrolled ([place])   Relevant Orders   Bayer DCA Hb A1c Waived (Completed)   CMP14+EGFR   Hyperlipidemia associated with type 2 diabetes mellitus ([facility])   Relevant Orders   Lipid panel     Other   Chronic foot pain   Relevant Medications   Oxycodone HCl 10 MG TABS   Oxycodone HCl 10 MG TABS   Oxycodone HCl 10 MG TABS    Other Visit Diagnoses    Other fatigue       Consider sleep study but for now we'll test testosterone and thyroid   Relevant Orders   Thyroid Panel With TSH   Testosterone,Free and Total   CBC with Differential/Platelet       Follow up plan: No Follow-up on file.  Counseling provided for all of the vaccine components Orders Placed This Encounter  Procedures  . Bayer DCA Hb A1c Waived  . CMP14+EGFR  . Lipid panel    Caryl Pina, MD [facility] 09/16/2016, 8:21 AM

## 2016-09-17 LAB — CMP14+EGFR
ALT: 35 IU/L (ref 0–44)
AST: 30 IU/L (ref 0–40)
Albumin/Globulin Ratio: 1.7 (ref 1.2–2.2)
Albumin: 4.6 g/dL (ref 3.5–5.5)
Alkaline Phosphatase: 98 IU/L (ref 39–117)
BUN/Creatinine Ratio: 21 — ABNORMAL HIGH (ref 9–20)
BUN: 20 mg/dL (ref 6–24)
Bilirubin Total: 0.3 mg/dL (ref 0.0–1.2)
CO2: 19 mmol/L — ABNORMAL LOW (ref 20–29)
Calcium: 9.9 mg/dL (ref 8.7–10.2)
Chloride: 99 mmol/L (ref 96–106)
Creatinine, Ser: 0.96 mg/dL (ref 0.76–1.27)
GFR calc Af Amer: 105 mL/min/{1.73_m2} (ref >59)
GFR calc non Af Amer: 91 mL/min/{1.73_m2} (ref >59)
Globulin, Total: 2.7 g/dL (ref 1.5–4.5)
Glucose: 158 mg/dL — ABNORMAL HIGH (ref 65–99)
Potassium: 5.1 mmol/L (ref 3.5–5.2)
Sodium: 137 mmol/L (ref 134–144)
Total Protein: 7.3 g/dL (ref 6.0–8.5)

## 2016-09-17 LAB — CBC WITH DIFFERENTIAL/PLATELET
Basophils Absolute: 0 10*3/uL (ref 0.0–0.2)
Basos: 0 %
EOS (ABSOLUTE): 0.2 10*3/uL (ref 0.0–0.4)
Eos: 2 %
Hematocrit: 51.5 % — ABNORMAL HIGH (ref 37.5–51.0)
Hemoglobin: 17.7 g/dL (ref 13.0–17.7)
Immature Grans (Abs): 0 10*3/uL (ref 0.0–0.1)
Immature Granulocytes: 0 %
Lymphocytes Absolute: 2.5 10*3/uL (ref 0.7–3.1)
Lymphs: 21 %
MCH: 31.8 pg (ref 26.6–33.0)
MCHC: 34.4 g/dL (ref 31.5–35.7)
MCV: 93 fL (ref 79–97)
Monocytes Absolute: 1.2 10*3/uL — ABNORMAL HIGH (ref 0.1–0.9)
Monocytes: 10 %
Neutrophils Absolute: 7.8 10*3/uL — ABNORMAL HIGH (ref 1.4–7.0)
Neutrophils: 67 %
Platelets: 202 10*3/uL (ref 150–379)
RBC: 5.56 x10E6/uL (ref 4.14–5.80)
RDW: 13.9 % (ref 12.3–15.4)
WBC: 11.8 10*3/uL — ABNORMAL HIGH (ref 3.4–10.8)

## 2016-09-17 LAB — LIPID PANEL
Chol/HDL Ratio: 5 ratio (ref 0.0–5.0)
Cholesterol, Total: 189 mg/dL (ref 100–199)
HDL: 38 mg/dL — ABNORMAL LOW (ref >39)
Triglycerides: 428 mg/dL — ABNORMAL HIGH (ref 0–149)

## 2016-09-17 LAB — THYROID PANEL WITH TSH
Free Thyroxine Index: 2.5 (ref 1.2–4.9)
T3 Uptake Ratio: 26 % (ref 24–39)
T4, Total: 9.7 ug/dL (ref 4.5–12.0)
TSH: 3.51 u[IU]/mL (ref 0.450–4.500)

## 2016-09-17 LAB — TESTOSTERONE,FREE AND TOTAL
Testosterone, Free: 6.7 pg/mL — ABNORMAL LOW (ref 7.2–24.0)
Testosterone: 237 ng/dL — ABNORMAL LOW (ref 264–916)

## 2016-09-20 ENCOUNTER — Telehealth: Payer: Self-pay | Admitting: Family Medicine

## 2016-09-20 ENCOUNTER — Other Ambulatory Visit: Payer: Self-pay

## 2016-09-20 MED ORDER — EMPAGLIFLOZIN 25 MG PO TABS: 25.0000 mg | ORAL_TABLET | Freq: Every day | ORAL | 0 refills | Status: DC

## 2016-09-20 NOTE — Telephone Encounter (Signed)
Patient aware of all labs

## 2016-09-21 ENCOUNTER — Ambulatory Visit: Payer: Medicare HMO | Admitting: *Deleted

## 2016-09-27 ENCOUNTER — Encounter: Payer: Self-pay | Admitting: Family Medicine

## 2016-09-27 DIAGNOSIS — G4730 Sleep apnea, unspecified: Secondary | ICD-10-CM

## 2016-09-27 DIAGNOSIS — E1149 Type 2 diabetes mellitus with other diabetic neurological complication: Secondary | ICD-10-CM

## 2016-09-27 MED ORDER — SIMVASTATIN 20 MG PO TABS: 20.0000 mg | ORAL_TABLET | Freq: Every day | ORAL | 3 refills | Status: DC

## 2016-09-27 NOTE — Telephone Encounter (Signed)
Placed sleep study ordered and sent simvastatin and placed referral for ophthalmology for diabetic eye exam. Caryl Pina, MD [facility] 09/27/2016, 6:46 PM

## 2016-09-29 ENCOUNTER — Encounter: Payer: Self-pay | Admitting: *Deleted

## 2016-09-29 ENCOUNTER — Ambulatory Visit (INDEPENDENT_AMBULATORY_CARE_PROVIDER_SITE_OTHER): Payer: Medicare HMO | Admitting: *Deleted

## 2016-09-29 DIAGNOSIS — Z0000 Encounter for general adult medical examination without abnormal findings: Secondary | ICD-10-CM
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
## 2016-09-29 NOTE — Patient Instructions (Addendum)
  Mr. Montano , Thank you for taking time to come for your Medicare Wellness Visit. I appreciate your ongoing commitment to your health goals. Please review the following plan we discussed and let me know if I can assist you in the future.   These are the goals we discussed:  Set a quit date for quitting smoking with your wife.  There are medications that can help you quit smoking.  If you are interested in these, please make an appointment with Dr. Warrick Parisian to discuss.     Please working on increasing your exercise on the stationary bike to at least 3 times per week building up to 30 minutes per session.  Continue improving your diet by reducing sugar intake and increasing vegetable servings.   Please call Dr. Marita Kansas office where you had your colonoscopy in [place], and request them to send a copy of the results to our office at [phone].   Thank you,  Nolberto Hanlon, RN

## 2016-10-14 ENCOUNTER — Ambulatory Visit (INDEPENDENT_AMBULATORY_CARE_PROVIDER_SITE_OTHER): Payer: Medicare HMO | Admitting: Podiatry

## 2016-10-14 DIAGNOSIS — M216X9 Other acquired deformities of unspecified foot: Secondary | ICD-10-CM | POA: Diagnosis not present

## 2016-10-14 DIAGNOSIS — M2041 Other hammer toe(s) (acquired), right foot: Secondary | ICD-10-CM

## 2016-10-14 DIAGNOSIS — E1149 Type 2 diabetes mellitus with other diabetic neurological complication: Secondary | ICD-10-CM

## 2016-10-14 DIAGNOSIS — M2042 Other hammer toe(s) (acquired), left foot: Secondary | ICD-10-CM

## 2016-10-14 DIAGNOSIS — Q828 Other specified congenital malformations of skin: Secondary | ICD-10-CM | POA: Diagnosis not present

## 2016-10-14 NOTE — Progress Notes (Signed)
Subjective: Timothy Nelson, 51 year old male, presents today for continued care of painful calluses to both of his feet. He also presents today for diabetic shoe/insert pick up.  Denies any systemic complaints such as fevers, chills, nausea, vomiting. He is also requesting diabetic shoes.No acute changes since last appointment, and no other complaints at this time. He has no new concerns today.   There is exam today and notices small area of blood blister on the plantar arch of the left foot he states that he gets Something However He Didn't Get It out and a Blister Formed. His Tetanus Is Up-To-Date. Denies any redness, drainage, swelling.   Objective: AAO x3, NAD DP/PT pulses palpable bilaterally, CRT less than 3 seconds Sensation decreased with SWMF Bilateral submetatarsal 5 hyperkeratotic lesion. Upon debridement no underlying ulceration, drainage or any signs of infection.  On the left side there is a deeper, annular hyperkerotic lesion. There is no evidence of pinpoint bleeding or evidence of verruca. No foreign body identified. The right side appears to be more pre-ulcerative today.  Along the medial arch of the foot on the left side there is a small area of what appears to hold blood blister. The previous incision in, healthy, pink skin underneath the area and there is no evidence of foreign body. Is no edema, erythema, drainage or pus or any clinical signs of infection present. Prominent metatarsal heads bilaterally. Hammertoes present No edema, erythema, increase in warmth to bilateral lower extremities.  No open lesions or pre-ulcerative lesions.  No pain with calf compression, swelling, warmth, erythema  Assessment: Porokeratosis bilateral subtarsal 5; type II diabetes mellitus with neuropathy; results for moderately left foot  Plan: -All treatment options discussed with the patient including all alternatives, risks, complications.  -Lesions debrided 2 without any complications or  bleeding; continue offloading pads. -Diabetic shoes and inserts were dispensed to him today. They appear to have an appropriate fit and he states they're comfortable. Monitoring skin breakdown or issues and to call the office should any occur.  -Follow-up in 6 weeks at his request or sooner if needed. -Patient encouraged to call the office with any questions, concerns, change in symptoms.   Celesta Gentile, DPM

## 2016-10-15 ENCOUNTER — Telehealth: Payer: Self-pay | Admitting: Family Medicine

## 2016-10-19 NOTE — Telephone Encounter (Signed)
done

## 2016-10-27 ENCOUNTER — Telehealth: Payer: Self-pay | Admitting: *Deleted

## 2016-10-27 DIAGNOSIS — D3131 Benign neoplasm of right choroid: Secondary | ICD-10-CM | POA: Diagnosis not present

## 2016-10-27 DIAGNOSIS — E113293 Type 2 diabetes mellitus with mild nonproliferative diabetic retinopathy without macular edema, bilateral: Secondary | ICD-10-CM | POA: Diagnosis not present

## 2016-10-27 DIAGNOSIS — H2513 Age-related nuclear cataract, bilateral: Secondary | ICD-10-CM | POA: Diagnosis not present

## 2016-10-27 DIAGNOSIS — Z7984 Long term (current) use of oral hypoglycemic drugs: Secondary | ICD-10-CM | POA: Diagnosis not present

## 2016-10-27 LAB — HM DIABETES EYE EXAM

## 2016-10-27 NOTE — Telephone Encounter (Signed)
Called patient and stated to call me when he got the message-just needing the patient to come by at his convenience and I forgot to get the patient to sign the diabetic form to get his shoes. Lattie Haw

## 2016-11-28 ENCOUNTER — Encounter: Payer: Self-pay | Admitting: Family Medicine

## 2016-11-29 ENCOUNTER — Other Ambulatory Visit: Payer: Self-pay | Admitting: Family Medicine

## 2016-11-30 NOTE — Telephone Encounter (Signed)
OV 12/15/16

## 2016-12-03 ENCOUNTER — Ambulatory Visit: Payer: Medicare HMO | Admitting: Podiatry

## 2016-12-03 DIAGNOSIS — Q828 Other specified congenital malformations of skin: Secondary | ICD-10-CM

## 2016-12-03 DIAGNOSIS — E1149 Type 2 diabetes mellitus with other diabetic neurological complication: Secondary | ICD-10-CM

## 2016-12-03 NOTE — Progress Notes (Signed)
Subjective: Mr. Cislo, 51 year old male, presents today for continued care of painful calluses to both of his feet. He also presents today for diabetic shoe/insert pick up.  Denies any systemic complaints such as fevers, chills, nausea, vomiting. He is also requesting diabetic shoes.No acute changes since last appointment, and no other complaints at this time. He has no new concerns today.   Objective: AAO x3, NAD DP/PT pulses palpable bilaterally, CRT less than 3 seconds Sensation decreased with SWMF Bilateral submetatarsal 5 hyperkeratotic lesion. Upon debridement no underlying ulceration, drainage or any signs of infection.  Prominent metatarsal heads bilaterally. Hammertoes present No edema, erythema, increase in warmth to bilateral lower extremities.  No open lesions or pre-ulcerative lesions.  No pain with calf compression, swelling, warmth, erythema  Assessment: Porokeratosis bilateral subtarsal 5; type II diabetes mellitus with neuropathy  Plan: -All treatment options discussed with the patient including all alternatives, risks, complications.  -Lesions debrided 2 without any complications or bleeding; continue offloading pads.  -Follow-up in 6 weeks at his request or sooner if needed. -Patient encouraged to call the office with any questions, concerns, change in symptoms.   Celesta Gentile, DPM

## 2016-12-15 ENCOUNTER — Telehealth: Payer: Self-pay | Admitting: Family Medicine

## 2016-12-15 ENCOUNTER — Ambulatory Visit: Payer: Medicare HMO | Admitting: Family Medicine

## 2016-12-15 ENCOUNTER — Encounter: Payer: Self-pay | Admitting: Family Medicine

## 2016-12-15 VITALS — BP 116/78 | HR 77 | Temp 98.0°F | Ht 73.0 in | Wt 260.0 lb

## 2016-12-15 DIAGNOSIS — E1165 Type 2 diabetes mellitus with hyperglycemia: Secondary | ICD-10-CM

## 2016-12-15 DIAGNOSIS — I10 Essential (primary) hypertension: Secondary | ICD-10-CM

## 2016-12-15 DIAGNOSIS — M79671 Pain in right foot: Secondary | ICD-10-CM | POA: Diagnosis not present

## 2016-12-15 DIAGNOSIS — G8929 Other chronic pain: Secondary | ICD-10-CM | POA: Diagnosis not present

## 2016-12-15 DIAGNOSIS — E1169 Type 2 diabetes mellitus with other specified complication: Secondary | ICD-10-CM

## 2016-12-15 DIAGNOSIS — E1159 Type 2 diabetes mellitus with other circulatory complications: Secondary | ICD-10-CM

## 2016-12-15 DIAGNOSIS — E785 Hyperlipidemia, unspecified: Secondary | ICD-10-CM | POA: Diagnosis not present

## 2016-12-15 LAB — BAYER DCA HB A1C WAIVED: HB A1C (BAYER DCA - WAIVED): 7 % — ABNORMAL HIGH (ref <7.0)

## 2016-12-15 MED ORDER — METOCLOPRAMIDE HCL 10 MG PO TABS: 10.0000 mg | ORAL_TABLET | Freq: Two times a day (BID) | ORAL | 3 refills | Status: DC

## 2016-12-15 MED ORDER — EMPAGLIFLOZIN 25 MG PO TABS: 25.0000 mg | ORAL_TABLET | Freq: Every day | ORAL | 3 refills | Status: DC

## 2016-12-15 MED ORDER — OXYCODONE HCL 10 MG PO TABS: 10.0000 mg | ORAL_TABLET | Freq: Four times a day (QID) | ORAL | 0 refills | Status: DC | PRN

## 2016-12-15 MED ORDER — SIMVASTATIN 40 MG PO TABS: 40.0000 mg | ORAL_TABLET | Freq: Every day | ORAL | 3 refills | Status: DC

## 2016-12-15 MED ORDER — METFORMIN HCL 1000 MG PO TABS: 1000.0000 mg | ORAL_TABLET | Freq: Two times a day (BID) | ORAL | 3 refills | Status: DC

## 2016-12-15 MED ORDER — LISINOPRIL 20 MG PO TABS: 20.0000 mg | ORAL_TABLET | Freq: Every day | ORAL | 3 refills | Status: DC

## 2016-12-15 MED ORDER — GLIPIZIDE ER 10 MG PO TB24: 10.0000 mg | ORAL_TABLET | Freq: Every day | ORAL | 3 refills | Status: DC

## 2016-12-15 NOTE — Progress Notes (Signed)
BP 116/78   Pulse 77   Temp 98 F (36.7 C) (Oral)   Ht _0  (1.854 m)   Wt 260 lb (117.9 kg)   BMI 34.30 kg/m    Subjective:    Patient ID: Timothy Nelson., male    DOB: 08/29/1965, 51 y.o.   MRN: [id number]  HPI: Timothy Nelson. is a 51 y.o. male presenting on 12/15/2016 for Diabetes (3 month follow up); Hypertension; and Hyperlipidemia   HPI Type 2 diabetes mellitus Patient comes in today for recheck of his diabetes. Patient has been currently taking metformin and glipizide and Jardiance. Patient is currently on an ACE inhibitor/ARB. Patient has not seen an ophthalmologist this year. Patient denies any new issues with their feet but does see a podiatrist regularly.   Hypertension Patient is currently on lisinopril, and their blood pressure today is 116/78. Patient denies any lightheadedness or dizziness. Patient denies headaches, blurred vision, chest pains, shortness of breath, or weakness. Denies any side effects from medication and is content with current medication.   Hyperlipidemia Patient is coming in for recheck of his hyperlipidemia. The patient is currently taking simvastatin. They deny any issues with myalgias or history of liver damage from it. They deny any focal numbness or weakness or chest pain.   Chronic pain/medication refill Patient has chronic foot pain and currently takes oxycodone 10 mg 4 times daily which is 60 morphine equivalents daily.  He has been on this dose for some time now and has been stable on this dose.  He chronically injured his feet from a fall when he was a lot younger in an accident that left his ankle is deformed and does see a podiatrist regularly for this.  Relevant past medical, surgical, family and social history reviewed and updated as indicated. Interim medical history since our last visit reviewed. Allergies and medications reviewed and updated.  Review of Systems  Constitutional: Negative for chills and fever.  Respiratory:  Negative for shortness of breath and wheezing.   Cardiovascular: Negative for chest pain and leg swelling.  Musculoskeletal: Positive for arthralgias. Negative for back pain and gait problem.  Skin: Negative for rash.  Neurological: Negative for dizziness, weakness, light-headedness and numbness.  All other systems reviewed and are negative.   Per HPI unless specifically indicated above   Allergies as of 12/15/2016      Reactions   Fenofibrate Rash   Permament rash.      Medication List        Accurate as of 12/15/16  3:44 PM. Always use your most recent med list.          accu-chek softclix lancets Use as instructed   CINNAMON PO Take 1 capsule by mouth 2 (two) times daily.   empagliflozin 25 MG Tabs tablet Commonly known as:  JARDIANCE Take 25 mg by mouth daily.   fluticasone 50 MCG/ACT nasal spray Commonly known as:  FLONASE Place 1 spray into both nostrils 2 (two) times daily as needed for allergies or rhinitis.   glipiZIDE 10 MG 24 hr tablet Commonly known as:  GLUCOTROL XL Take 1 tablet (10 mg total) by mouth daily with breakfast.   glucose blood test strip Commonly known as:  ACCU-CHEK AVIVA Use as instructed   lisinopril 20 MG tablet Commonly known as:  PRINIVIL,ZESTRIL Take 1 tablet (20 mg total) by mouth daily.   metFORMIN 1000 MG tablet Commonly known as:  GLUCOPHAGE Take 1 tablet (1,000 mg total) by mouth 2 (two)  times daily with a meal.   metoCLOPramide 10 MG tablet Commonly known as:  REGLAN Take 1 tablet (10 mg total) by mouth 2 (two) times daily.   Oxycodone HCl 10 MG Tabs Take 1 tablet (10 mg total) by mouth 4 (four) times daily as needed. Do not fill until 30 days from prescription date   Oxycodone HCl 10 MG Tabs Take 1 tablet (10 mg total) by mouth 4 (four) times daily as needed.   Oxycodone HCl 10 MG Tabs Take 1 tablet (10 mg total) by mouth 4 (four) times daily as needed.   simvastatin 40 MG tablet Commonly known as:   ZOCOR Take 1 tablet (40 mg total) by mouth daily at 6 PM.          Objective:    BP 116/78   Pulse 77   Temp 98 F (36.7 C) (Oral)   Ht _0  (1.854 m)   Wt 260 lb (117.9 kg)   BMI 34.30 kg/m   Wt Readings from Last 3 Encounters:  12/15/16 260 lb (117.9 kg)  09/29/16 262 lb (118.8 kg)  09/16/16 266 lb (120.7 kg)    Physical Exam  Constitutional: He is oriented to person, place, and time. He appears well-developed and well-nourished. No distress.  Eyes: Conjunctivae are normal. No scleral icterus.  Neck: Neck supple. No thyromegaly present.  Cardiovascular: Normal rate, regular rhythm, normal heart sounds and intact distal pulses.  No murmur heard. Pulmonary/Chest: Effort normal and breath sounds normal. No respiratory distress. He has no wheezes.  Musculoskeletal: Normal range of motion. He exhibits edema (1+ edema in bilateral lower extremities).  Lymphadenopathy:    He has no cervical adenopathy.  Neurological: He is alert and oriented to person, place, and time. Coordination normal.  Skin: Skin is warm and dry. No rash noted. He is not diaphoretic.  Psychiatric: He has a normal mood and affect. His behavior is normal.  Nursing note and vitals reviewed.   Results for orders placed or performed in visit on 09/16/16  Bayer DCA Hb A1c Waived  Result Value Ref Range   Bayer DCA Hb A1c Waived 7.7 (H) <7.0 %  CMP14+EGFR  Result Value Ref Range   Glucose 158 (H) 65 - 99 mg/dL   BUN 20 6 - 24 mg/dL   Creatinine, Ser 0.96 0.76 - 1.27 mg/dL   GFR calc non Af Amer 91 >59 mL/min/1.73   GFR calc Af Amer 105 >59 mL/min/1.73   BUN/Creatinine Ratio 21 (H) 9 - 20   Sodium 137 134 - 144 mmol/L   Potassium 5.1 3.5 - 5.2 mmol/L   Chloride 99 96 - 106 mmol/L   CO2 19 (L) 20 - 29 mmol/L   Calcium 9.9 8.7 - 10.2 mg/dL   Total Protein 7.3 6.0 - 8.5 g/dL   Albumin 4.6 3.5 - 5.5 g/dL   Globulin, Total 2.7 1.5 - 4.5 g/dL   Albumin/Globulin Ratio 1.7 1.2 - 2.2   Bilirubin Total 0.3  0.0 - 1.2 mg/dL   Alkaline Phosphatase 98 39 - 117 IU/L   AST 30 0 - 40 IU/L   ALT 35 0 - 44 IU/L  Lipid panel  Result Value Ref Range   Cholesterol, Total 189 100 - 199 mg/dL   Triglycerides 428 (H) 0 - 149 mg/dL   HDL 38 (L) >39 mg/dL   VLDL Cholesterol Cal Comment 5 - 40 mg/dL   LDL Calculated Comment 0 - 99 mg/dL   Chol/HDL Ratio 5.0 0.0 -  5.0 ratio  Thyroid Panel With TSH  Result Value Ref Range   TSH 3.510 0.450 - 4.500 uIU/mL   T4, Total 9.7 4.5 - 12.0 ug/dL   T3 Uptake Ratio 26 24 - 39 %   Free Thyroxine Index 2.5 1.2 - 4.9  Testosterone,Free and Total  Result Value Ref Range   Testosterone 237 (L) 264 - 916 ng/dL   Testosterone, Free 6.7 (L) 7.2 - 24.0 pg/mL  CBC with Differential/Platelet  Result Value Ref Range   WBC 11.8 (H) 3.4 - 10.8 x10E3/uL   RBC 5.56 4.14 - 5.80 x10E6/uL   Hemoglobin 17.7 13.0 - 17.7 g/dL   Hematocrit 51.5 (H) 37.5 - 51.0 %   MCV 93 79 - 97 fL   MCH 31.8 26.6 - 33.0 pg   MCHC 34.4 31.5 - 35.7 g/dL   RDW 13.9 12.3 - 15.4 %   Platelets 202 150 - 379 x10E3/uL   Neutrophils 67 Not Estab. %   Lymphs 21 Not Estab. %   Monocytes 10 Not Estab. %   Eos 2 Not Estab. %   Basos 0 Not Estab. %   Neutrophils Absolute 7.8 (H) 1.4 - 7.0 x10E3/uL   Lymphocytes Absolute 2.5 0.7 - 3.1 x10E3/uL   Monocytes Absolute 1.2 (H) 0.1 - 0.9 x10E3/uL   EOS (ABSOLUTE) 0.2 0.0 - 0.4 x10E3/uL   Basophils Absolute 0.0 0.0 - 0.2 x10E3/uL   Immature Granulocytes 0 Not Estab. %   Immature Grans (Abs) 0.0 0.0 - 0.1 x10E3/uL      Assessment & Plan:   Problem List Items Addressed This Visit      Cardiovascular and Mediastinum   Hypertension associated with diabetes ([place])   Relevant Medications   simvastatin (ZOCOR) 40 MG tablet   metFORMIN (GLUCOPHAGE) 1000 MG tablet   lisinopril (PRINIVIL,ZESTRIL) 20 MG tablet   empagliflozin (JARDIANCE) 25 MG TABS tablet   glipiZIDE (GLUCOTROL XL) 10 MG 24 hr tablet   Other Relevant Orders   CMP14+EGFR (Completed)      Endocrine   Diabetes type 2, uncontrolled (HCC)   Relevant Medications   simvastatin (ZOCOR) 40 MG tablet   metFORMIN (GLUCOPHAGE) 1000 MG tablet   lisinopril (PRINIVIL,ZESTRIL) 20 MG tablet   empagliflozin (JARDIANCE) 25 MG TABS tablet   glipiZIDE (GLUCOTROL XL) 10 MG 24 hr tablet   Other Relevant Orders   Bayer DCA Hb A1c Waived (Completed)   Hyperlipidemia associated with type 2 diabetes mellitus (HCC) - Primary   Relevant Medications   simvastatin (ZOCOR) 40 MG tablet   metFORMIN (GLUCOPHAGE) 1000 MG tablet   lisinopril (PRINIVIL,ZESTRIL) 20 MG tablet   empagliflozin (JARDIANCE) 25 MG TABS tablet   glipiZIDE (GLUCOTROL XL) 10 MG 24 hr tablet   Other Relevant Orders   Lipid panel (Completed)     Other   Chronic foot pain   Relevant Medications   Oxycodone HCl 10 MG TABS   Oxycodone HCl 10 MG TABS   Oxycodone HCl 10 MG TABS       Follow up plan: Return in about 3 months (around 03/15/2017), or if symptoms worsen or fail to improve, for Pain recheck and diabetes recheck.  Counseling provided for all of the vaccine components Orders Placed This Encounter  Procedures  . Bayer DCA Hb A1c Waived  . CMP14+EGFR  . Lipid panel    Caryl Pina, MD [facility] 12/15/2016, 3:44 PM

## 2016-12-16 LAB — CMP14+EGFR
ALT: 29 IU/L (ref 0–44)
AST: 21 IU/L (ref 0–40)
Albumin/Globulin Ratio: 1.6 (ref 1.2–2.2)
Albumin: 4.4 g/dL (ref 3.5–5.5)
Alkaline Phosphatase: 95 IU/L (ref 39–117)
BUN/Creatinine Ratio: 11 (ref 9–20)
BUN: 10 mg/dL (ref 6–24)
Bilirubin Total: 0.3 mg/dL (ref 0.0–1.2)
CO2: 21 mmol/L (ref 20–29)
Calcium: 9.6 mg/dL (ref 8.7–10.2)
Chloride: 99 mmol/L (ref 96–106)
Creatinine, Ser: 0.88 mg/dL (ref 0.76–1.27)
GFR calc Af Amer: 115 mL/min/{1.73_m2} (ref >59)
GFR calc non Af Amer: 99 mL/min/{1.73_m2} (ref >59)
Globulin, Total: 2.7 g/dL (ref 1.5–4.5)
Glucose: 140 mg/dL — ABNORMAL HIGH (ref 65–99)
Potassium: 4.8 mmol/L (ref 3.5–5.2)
Sodium: 137 mmol/L (ref 134–144)
Total Protein: 7.1 g/dL (ref 6.0–8.5)

## 2016-12-16 LAB — LIPID PANEL
Chol/HDL Ratio: 3.7 ratio (ref 0.0–5.0)
Cholesterol, Total: 141 mg/dL (ref 100–199)
HDL: 38 mg/dL — ABNORMAL LOW (ref >39)
LDL Calculated: 48 mg/dL (ref 0–99)
Triglycerides: 276 mg/dL — ABNORMAL HIGH (ref 0–149)
VLDL Cholesterol Cal: 55 mg/dL — ABNORMAL HIGH (ref 5–40)

## 2016-12-23 ENCOUNTER — Ambulatory Visit: Payer: Medicare HMO | Admitting: Family Medicine

## 2016-12-23 ENCOUNTER — Encounter: Payer: Self-pay | Admitting: Family Medicine

## 2016-12-23 MED ORDER — CEFDINIR 300 MG PO CAPS: 300.0000 mg | ORAL_CAPSULE | Freq: Two times a day (BID) | ORAL | 0 refills | Status: DC

## 2017-01-07 ENCOUNTER — Encounter: Payer: Self-pay | Admitting: Family Medicine

## 2017-01-07 DIAGNOSIS — J0100 Acute maxillary sinusitis, unspecified: Secondary | ICD-10-CM

## 2017-01-07 MED ORDER — FLUTICASONE PROPIONATE 50 MCG/ACT NA SUSP: 1.0000 | Freq: Two times a day (BID) | NASAL | 6 refills | Status: DC | PRN

## 2017-01-14 ENCOUNTER — Encounter: Payer: Self-pay | Admitting: Podiatry

## 2017-01-14 ENCOUNTER — Ambulatory Visit: Payer: Medicare HMO | Admitting: Podiatry

## 2017-01-14 DIAGNOSIS — Q828 Other specified congenital malformations of skin: Secondary | ICD-10-CM | POA: Diagnosis not present

## 2017-01-14 DIAGNOSIS — E1149 Type 2 diabetes mellitus with other diabetic neurological complication: Secondary | ICD-10-CM

## 2017-01-14 DIAGNOSIS — B353 Tinea pedis: Secondary | ICD-10-CM

## 2017-01-14 MED ORDER — KETOCONAZOLE 2 % EX CREA: 1.0000 "application " | TOPICAL_CREAM | Freq: Every day | CUTANEOUS | 2 refills | Status: DC

## 2017-01-14 NOTE — Progress Notes (Signed)
Subjective: Timothy Nelson, 52 year old male, presents today for continued care of painful calluses to both of his feet. Callus is better with the inserts but it does continue. He also has secondary concerns of peeling skin between the toes. He has tried OTC treatment without any improvement. No drainage. Denies any systemic complaints such as fevers, chills, nausea, vomiting. He is also requesting diabetic shoes.No acute changes since last appointment, and no other complaints at this time. He has no new concerns today.   Objective: AAO x3, NAD DP/PT pulses palpable bilaterally, CRT less than 3 seconds Sensation decreased with SWMF Bilateral submetatarsal 5 hyperkeratotic lesion. Upon debridement no underlying ulceration, drainage or any signs of infection.  Prominent metatarsal heads bilaterally. Hammertoes present Dry, peeling skin interdigitally without any drainage. Appears to be tineal pedis.  No edema, erythema, increase in warmth to bilateral lower extremities.  No open lesions or pre-ulcerative lesions.  No pain with calf compression, swelling, warmth, erythema  Assessment: Porokeratosis bilateral subtarsal 5; type II diabetes mellitus with neuropathy; athletes foot   Plan: -All treatment options discussed with the patient including all alternatives, risks, complications.  -Lesions debrided 2 without any complications or bleeding; continue offloading pads.  -Ketoconazole for tinea pedis.  -Follow-up in 6 weeks at his request or sooner if needed. -Patient encouraged to call the office with any questions, concerns, change in symptoms.   Celesta Gentile, DPM

## 2017-01-19 ENCOUNTER — Ambulatory Visit: Payer: Medicare HMO | Admitting: Neurology

## 2017-01-19 ENCOUNTER — Encounter: Payer: Self-pay | Admitting: Neurology

## 2017-01-19 VITALS — BP 115/70 | HR 82 | Ht 73.0 in | Wt 255.0 lb

## 2017-01-19 DIAGNOSIS — F428 Other obsessive-compulsive disorder: Secondary | ICD-10-CM | POA: Diagnosis not present

## 2017-01-19 DIAGNOSIS — Z6835 Body mass index (BMI) 35.0-35.9, adult: Secondary | ICD-10-CM

## 2017-01-19 DIAGNOSIS — R0683 Snoring: Secondary | ICD-10-CM

## 2017-01-19 DIAGNOSIS — F17200 Nicotine dependence, unspecified, uncomplicated: Secondary | ICD-10-CM | POA: Diagnosis not present

## 2017-01-19 DIAGNOSIS — F952 Tourette's disorder: Secondary | ICD-10-CM

## 2017-01-19 DIAGNOSIS — E661 Drug-induced obesity: Secondary | ICD-10-CM

## 2017-01-19 DIAGNOSIS — G4719 Other hypersomnia: Secondary | ICD-10-CM | POA: Diagnosis not present

## 2017-01-19 DIAGNOSIS — F429 Obsessive-compulsive disorder, unspecified: Secondary | ICD-10-CM | POA: Insufficient documentation

## 2017-01-19 DIAGNOSIS — E66812 Obesity, class 2: Secondary | ICD-10-CM

## 2017-01-19 NOTE — Progress Notes (Signed)
SLEEP MEDICINE CLINIC   Provider:  Larey Seat, M D  Primary Care Physician:  Dettinger, Fransisca Kaufmann, MD   Referring Provider: Dettinger, Fransisca Kaufmann, MD   Chief Complaint  Patient presents with  . New Patient (Initial Visit)    pt alone, rm 10. pt states that he always feels tired despite 6 hours of uninterupted. wife tells him he snores     HPI:  Timothy Nelson. is a 52 y.o. male , seen here as patient of Dr. Merita Norton for evaluation of excessive daytime sleepiness.  Timothy Nelson is a 52 year old Caucasian right-handed gentleman, married, who carries a diagnosis of diabetes, high cholesterol, tobacco use, excessive daytime sleepiness, constipation, hematuria, psoriasis.  He also has hypertension, hyperlipidemia and his wife has stated that he snores loudly. He has not been told however that he stops breathing in his sleep. Timothy Nelson states that over the last 8-10 months he has noted a decrease in sleep quality and the feeling of being tired, not refreshed or restored in the morning begun then.  Sleep habits are as follows: The patient reports getting tired at 7 PM, usually he spends the last hours before he goes to his bedroom watching TV in a reclining chair in his bedroom.  He also watches TV in bed after 9 often having a sleep latency of 2-3 hours.  Usually asleep by 1 AM , often later. He may have nocturia 2-3 times at night.  The first time usually between 1 and 2 AM and then again every other hour. However , he often is not asleep earlier than 4 AM, but he rises at 7 AM. He will drink a cup of coffee, eats some breakfast and returns to bed.  He will sleep for another 4 hours. Rises at 11 AM and does some work around the house.    Sleep medical history and family sleep history: Timothy Nelson has been on disability for the last 12 years after falling off a roof on the job.  3 years ago he moved from [place] to [place].  Reportedly suffered over 20 fractures, no TBI or spine injuries. .    Social history: married, wife is full time employed , each spouse has an adult child from a previous marriage.  He smokes a pack of cigarettes a day and has done so for 7 years.  He very seldomly drinks alcohol 1 or 2 beers a year.  He drinks pots of coffee, 4-5 large mugs a day, not after 6 Pm. Soda - 2 a week, iced tea rarely.   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   Review of Systems: Out of a complete 14 system review, the patient complains of only the following symptoms, and all other reviewed systems are negative. The patient, blood in the urine, and pain, increased thirst, and numb foot since the multiple fractures, left hand numbness - not related to wrist fracture. Carpal tunnel right sided surgery. Coughing , bronchitis, productive cough.  itching, he has an allergic skin reaction to fenofibrate, almost like psoriatic lesions but not above the elbow.  Epworth score 8-12 points  , Fatigue severity score n/ a , depression score n/a    Social History   Socioeconomic History  . Marital status: Married    Spouse name: Not on file  . Number of children: Not on file  . Years of education: Not on file  . Highest education level: Not on file  Social  Needs  . Financial resource strain: Not on file  . Food insecurity - worry: Not on file  . Food insecurity - inability: Not on file  . Transportation needs - medical: Not on file  . Transportation needs - non-medical: Not on file  Occupational History  . Not on file  Tobacco Use  . Smoking status: Current Every Day Smoker    Packs/day: 0.75    Years: 20.00    Pack years: 15.00    Types: Cigarettes  . Smokeless tobacco: Never Used  Substance and Sexual Activity  . Alcohol use: Yes    Alcohol/week: 0.6 oz    Types: 1 Glasses of wine per week    Comment: glass wine a week.   . Drug use: No  . Sexual activity: Yes    Comment: 9 year partner  Other Topics Concern  . Not on file  Social History  Narrative  . Not on file    Family History  Problem Relation Age of Onset  . Diabetes Mother   . Hypertension Mother   . Hyperlipidemia Mother   . Arthritis Mother   . Diabetes Father   . Heart disease Father        Age 49  . Hypertension Sister     Past Medical History:  Diagnosis Date  . Arthritis   . Chronic pain   . Essential hypertension   . GERD (gastroesophageal reflux disease)   . Hyperlipidemia   . Type 2 diabetes mellitus ([place])     Past Surgical History:  Procedure Laterality Date  . ESOPHAGOGASTRODUODENOSCOPY (EGD) WITH PROPOFOL N/A 04/04/2015   Procedure: ESOPHAGOGASTRODUODENOSCOPY (EGD) WITH PROPOFOL;  Surgeon: Rogene Houston, MD;  Location: [facility];  Service: Endoscopy;  Laterality: N/A;  8:30  . FRACTURE SURGERY  March 2007   Left arm, left thumb, bilateral legs  . WRIST FRACTURE SURGERY      Current Outpatient Medications  Medication Sig Dispense Refill  . cefdinir (OMNICEF) 300 MG capsule Take 1 capsule (300 mg total) by mouth 2 (two) times daily. 1 po BID 20 capsule 0  . CINNAMON PO Take 1 capsule by mouth 2 (two) times daily.    . empagliflozin (JARDIANCE) 25 MG TABS tablet Take 25 mg by mouth daily. 90 tablet 3  . fluticasone (FLONASE) 50 MCG/ACT nasal spray Place 1 spray into both nostrils 2 (two) times daily as needed for allergies or rhinitis. 16 g 6  . glipiZIDE (GLUCOTROL XL) 10 MG 24 hr tablet Take 1 tablet (10 mg total) by mouth daily with breakfast. 90 tablet 3  . glucose blood (ACCU-CHEK AVIVA) test strip Use as instructed 100 each 12  . ketoconazole (NIZORAL) 2 % cream Apply 1 application topically daily. 60 g 2  . Lancet Devices (ACCU-CHEK SOFTCLIX) lancets Use as instructed 1 each 0  . lisinopril (PRINIVIL,ZESTRIL) 20 MG tablet Take 1 tablet (20 mg total) by mouth daily. 90 tablet 3  . metFORMIN (GLUCOPHAGE) 1000 MG tablet Take 1 tablet (1,000 mg total) by mouth 2 (two) times daily with a meal. 180 tablet 3  . metoCLOPramide  (REGLAN) 10 MG tablet Take 1 tablet (10 mg total) by mouth 2 (two) times daily. 90 tablet 3  . Oxycodone HCl 10 MG TABS Take 1 tablet (10 mg total) by mouth 4 (four) times daily as needed. 120 tablet 0  . simvastatin (ZOCOR) 40 MG tablet Take 1 tablet (40 mg total) by mouth daily at 6 PM. 90 tablet 3  No current facility-administered medications for this visit.     Allergies as of 01/19/2017 - Review Complete 01/19/2017  Allergen Reaction Noted  . Fenofibrate Rash 09/11/2014    Vitals: BP 115/70   Pulse 82   Ht 6\' 1"  (1.854 m)   Wt 255 lb (115.7 kg)   BMI 33.64 kg/m  Last Weight:  Wt Readings from Last 1 Encounters:  01/19/17 255 lb (115.7 kg)   IRS:WNIO mass index is 33.64 kg/m.     Last Height:   Ht Readings from Last 1 Encounters:  01/19/17 6\' 1"  (1.854 m)    Physical exam:  General: The patient is awake, alert and appears to be  in  distress. He moves continuously, facial lip smacking,  Head: Normocephalic, atraumatic. Neck is supple. Mallampati  5  , red and swollen soft palate. Poor dentition.  neck circumference: 20  . Nasal airflow patent restircited ,  Retrognathia is not seen.  Cardiovascular:  Regular rate and rhythm , without  murmurs or carotid bruit, and without distended neck veins. Respiratory: Lungs are clear to auscultation. Skin:  Without evidence of edema, or rash Trunk: BMI is 34 . The patient's posture is leaning to the right -   Neurologic exam :  MOCA:No flowsheet data found. MMSE: MMSE - Mini Mental State Exam 09/29/2016  Orientation to time 5  Orientation to Place 5  Registration 3  Attention/ Calculation 5  Recall 3  Language- name 2 objects 2  Language- repeat 1  Language- follow 3 step command 3  Language- read & follow direction 1  Write a sentence 1  Copy design 1  Total score 30      Attention span & concentration ability appears limited, inattentive.   Speech is fluent,  without  dysarthria, mild dysphonia .  Mood and  affect are appropriate.  Cranial nerves: Pupils are equal and briskly reactive to light. Funduscopic exam without evidence of edema. No cataract.  Extraocular movements  in vertical and horizontal planes intact and without nystagmus. Visual fields by finger perimetry are intact. Hearing to finger rub intact. Facial sensation intact to fine touch. Facial motor strength is symmetric,  and tongue moved midline- with orofacial automatism. . Shoulder shrug was symmetrical.   Motor exam: Loss of grip strength in the left more than right upper extremity, left foot dorsiflexion weakness . sensory:  Fine touch, pinprick and vibration were tested in upper extremities. Proprioception tested in the upper extremities was normal.  Mr. Geesey reports significant numbness in the right foot.  Coordination: Rapid alternating movements ,  Finger-to-nose maneuver  normal without evidence of ataxia, dysmetria or tremor. Gait and station: Patient walks without assistive device and is able unassisted to climb up to the exam table. Strength within normal limits. He drifts to the right, his step is carried by the right heel.  Stance is stable and normal.  Turns with  4  Steps.  Deep tendon reflexes: in the  upper  extremities are attenuated -symmetric and patella in both  lower extremities are absent-    Assessment:  After physical and neurologic examination, review of laboratory studies,  Personal review of imaging studies, reports of other /same  Imaging studies, results of polysomnography and / or neurophysiology testing and pre-existing records as far as provided in visit., my assessment is   1) Mr. Grunewald daytime fatigue and sleepiness can be partially contributed to a lack of sleep regimen.  I would like for him to use his bedroom  for sleeping only and to eliminate screens, no laptop, smart phone or TV.  I would like for him to watch TV in his den bedroom to sleep.  2) it may be helpful to reduce the caffeine intake  to a time 6 hours before intended bedtime.  If the intended bedtime is 10 PM he should not drink coffee later than 4 PM.  3)He is a smoker and he is currently struggling with some productive sputum, coughing as well as with abdominal distention due to gastroparesis. Dr. Warrick Parisian treats him for his respiratory infections and sinusitis, that is.  Gastroparesis has been present for several years and is attributed to diabetes.  4) Mr. Edelen is a snorer and his wife has not told him if he also stops breathing at night, but his airway anatomy is Mallampati and is larger than average neck size as well as his recent weight gain during the winter months may all contribute to a high risk of obstructive sleep apnea being present.  He may also struggle with obstructive pulmonary disease.  For this reason I would like for him to undergo a home sleep test by watch pat. I urged him to address his insomnia and sleep hygiene before the HST can be meaningful.   5) OCD and the patient has phobias, can not sleep away from home, hates to use a strange bathroom, has ongoing motor ticks and repeatedly uses 4 letter words, cussing = he has Tourette' s syndrome.    The patient was advised of the nature of the diagnosed disorder , the treatment options and the  risks for general health and wellness arising from not treating the condition.   I spent more than 50 minutes of face to face time with the patient.  Greater than 50% of time was spent in counseling and coordination of care. We have discussed the diagnosis and differential and I answered the patient's questions.    Plan:  Sleep hygiene guidelines, routines  Smoking cessation  Caffeine restriction  HST , watch pat. RV with me.     Larey Seat, MD 5/91/6384, 6:65 PM  Certified in Neurology by ABPN Certified in [place] by [facility] [address] [place], [place] [address]

## 2017-01-19 NOTE — Patient Instructions (Signed)
Please remember to try to maintain good sleep hygiene, which means: Keep a regular sleep and wake schedule, try not to exercise or have a meal within 2 hours of your bedtime, try to keep your bedroom conducive for sleep, that is, cool and dark, without light distractors such as an illuminated alarm clock, and refrain from watching TV right before sleep or in the middle of the night and do not keep the TV or radio on during the night. Also, try not to use or play on electronic devices at bedtime, such as your cell phone, tablet PC or laptop. If you like to read at bedtime on an electronic device, try to dim the background light as much as possible. Do not eat in the middle of the night.   We will request a sleep study.    We will look for leg twitching and snoring or sleep apnea.   For chronic insomnia, you are best followed by a psychiatrist and/or sleep psychologist.   We will call you with the sleep study results and make a follow up appointment if needed.     Tourette Syndrome Tourette syndrome (TS) is a condition that affects the nervous system (neurological condition). TS is characterized by tics, which are repeated involuntary movements and uncontrollable vocal sounds. TS is usually passed along from parent to child (inherited). TS is a lifelong (chronic) condition that may get better over time. It often occurs along with other disorders. In rare cases, TS is not inherited. In these cases, the condition is referred to as sporadic Tourette syndrome. TS may also be called Gilles de la Tourette syndrome or Tourette's syndrome. What are the causes? The cause of this condition is not known. What increases the risk? This condition is more likely to develop in:  Men.  People with a family history of TS, tic disorders, or obsessive compulsive disorder (OCD).  What are the signs or symptoms? The main symptoms of this condition are repetitive verbal (phonic) or physical (motor) tics. Tics are  involuntary and unintentional. They may begin in one part of the body and spread. Tics vary greatly among people with TS. They often change over time in frequency, type, and severity. Motor Tics  Head jerking.  Neck stretching.  Jumping.  Foot stamping.  Twisting and bending the body.  Phonic Tics  Shouts.  Barks or yelps.  Grunts.  Sniffs.  Coughs.  Throat clearing.  Inappropriate words and phrases (rare).  Other symptoms  Expression of more than one tic in a row or at the same time (complex tic).  Involuntary urges to express tics, followed by relief after tics are expressed. This is similar to the urge to scratch an itch or sneeze.  A feeling of tension, burning, or tingling that builds up until you express a tic.  Increased frequency or severity of tics during times of stress or fatigue.  Self-harming behavior (rare). Sometimes, tics improve over time. It is possible that tics may go away completely in adulthood (remission). How is this diagnosed? This condition is diagnosed by your health care provider observing your tics. TS is usually diagnosed during childhood or adolescence. Your health care provider will also evaluate your family medical history. Tests that may be done include:  Blood tests.  MRI.  CT scan.  Electroencephalogram (EEG).  Tourette syndrome can be associated with other conditions (comorbid conditions or comorbidities), such as:  OCD. This is a common comorbidity.  Attention deficit hyperactivity disorder (ADHD).  Learning disabilities.  Behavioral problems.  If you have a comorbid condition, you may be referred to a health care provider who specializes in that condition.  How is this treated?  There is no cure for this condition. However, TS can be managed by controlling tics and treating comorbid conditions, if necessary. Treatment may include:  Recommendations for a support group or educational resources.  Behavioral  therapy.  Supportive counseling and therapy.  Being prescribed medicines. This may be needed if your symptoms interfere with your daily life.  Follow these instructions at home:  Take over-the-counter and prescription medicines only as told by your health care provider.  Follow instructions from your health care provider about eating or drinking restrictions. You may not be able to drink alcohol if you are taking certain medicines.  Pay attention to any changes in your symptoms.  Tell your health care provider if you are pregnant or may be pregnant.  Educate yourself and the people around you about your condition.  Keep all follow-up visits as told by your health care provider. Contact a health care provider if:  You have side effects from medicines that you are taking.  Your symptoms suddenly change or get worse.  You have unusual stiffness.  You have a sudden change in mood or behavior.  You feel hopeless or depressed. Get help right away if:  You have severe side effects from medicines that you are taking.  You have difficulty breathing or talking. This information is not intended to replace advice given to you by your health care provider. Make sure you discuss any questions you have with your health care provider. Document Released: 12/21/2004 Document Revised: 08/25/2015 Document Reviewed: 05/08/2014 Elsevier Interactive Patient Education  [employer].

## 2017-02-02 ENCOUNTER — Ambulatory Visit (INDEPENDENT_AMBULATORY_CARE_PROVIDER_SITE_OTHER): Payer: Medicare HMO | Admitting: Neurology

## 2017-02-02 DIAGNOSIS — G4710 Hypersomnia, unspecified: Secondary | ICD-10-CM | POA: Diagnosis not present

## 2017-02-02 DIAGNOSIS — F17200 Nicotine dependence, unspecified, uncomplicated: Secondary | ICD-10-CM

## 2017-02-02 DIAGNOSIS — F952 Tourette's disorder: Secondary | ICD-10-CM

## 2017-02-02 DIAGNOSIS — F428 Other obsessive-compulsive disorder: Secondary | ICD-10-CM

## 2017-02-02 DIAGNOSIS — R0683 Snoring: Secondary | ICD-10-CM

## 2017-02-02 DIAGNOSIS — Z72821 Inadequate sleep hygiene: Secondary | ICD-10-CM

## 2017-02-02 DIAGNOSIS — Z6835 Body mass index (BMI) 35.0-35.9, adult: Secondary | ICD-10-CM

## 2017-02-02 DIAGNOSIS — G4719 Other hypersomnia: Secondary | ICD-10-CM

## 2017-02-02 DIAGNOSIS — E661 Drug-induced obesity: Secondary | ICD-10-CM

## 2017-02-04 ENCOUNTER — Other Ambulatory Visit: Payer: Self-pay | Admitting: Neurology

## 2017-02-04 DIAGNOSIS — G4733 Obstructive sleep apnea (adult) (pediatric): Secondary | ICD-10-CM

## 2017-02-04 DIAGNOSIS — G478 Other sleep disorders: Secondary | ICD-10-CM

## 2017-02-04 DIAGNOSIS — Z72821 Inadequate sleep hygiene: Secondary | ICD-10-CM | POA: Insufficient documentation

## 2017-02-04 NOTE — Procedures (Signed)
[facility] [address] [place], [place] [address] NAME:   Timothy Nelson.                                                      DOB: October 02, 1965 MEDICAL RECORD No:   [id number]                                    DOS: 02/02/2017 REFERRING PHYSICIAN: Caryl Pina, M.D.  STUDY PERFORMED: HST on Watch Pat HISTORY: Abdallah Hern. is a 52 year old Caucasian right-handed gentleman, married, who carries a diagnosis of chronic pain due to a disabling accident ( narcotic pain medication is listed) , diabetes, high cholesterol, tobacco use, excessive daytime sleepiness, constipation, hematuria, psoriasis.  He also has hypertension, hyperlipidemia and his wife has stated that he snores loudly, he stops breathing in his sleep. Mr. Strole has noted a decrease in sleep quality and begun feeling increasingly tired and not refreshed in the morning. Sleep habits are indicated on 01-19-2017 as follows: The patient is no longer physically active after 7 PM, usually he spends the last 6 hours of the day watching TV in a reclining chair in his bedroom and then watches TV in bed for another 2-3 hours.  Usually asleep not earlier than 1 AM (and often 4 AM), he has nocturia 2-3 times at night, and then rises at 7 AM. He will have coffee with breakfast and returns to bed to sleep for another 4 hours, rises again at 11 AM.  BMI 33.9.  Epworth Sleepiness score endorsed at 8-12 points.     STUDY RESULTS: Total Recording Time: 7 hours, 13 minutes. Recorded from 8.53 PM to 4.07 AM.  Total Apnea/Hypopnea Index (AHI): 23.0 /hr. RDI was 23.8/hr. (no central apneas). Average Oxygen Saturation: 92%; Lowest Oxygen Desaturation: 83%.  Total Time in Oxygen Saturation Below 89%:  7.8 minutes  Average Heart Rate:  72 bpm (49-117 bpm)  IMPRESSION: This HST indicates the presence of moderate obstructive sleep apnea, at an apnea hypopnea index of 23 /hr.  The recording time and the sleep habits of  the patient did not correlate. The oxygen saturation reading surprises me for a patient with orthopnea, coughing, and on pain medication. I have doubts that this recording captured the full extent of the patients sleep disorder.   RECOMMENDATION:  CPAP is a treatment that will correct apnea and snoring. I have doubts that this recording captured the full extent of the patients sleep disorder.   I certify that I have reviewed the raw data recording prior to the issuance of this report in accordance with the standards of the American Academy of Sleep Medicine (AASM). Larey Seat, M.D.   02-04-2017   Medical Director of [place] [facility] at [facility], accredited by the AASM. Diplomat of the ABPN and ABSM.

## 2017-02-07 ENCOUNTER — Telehealth: Payer: Self-pay | Admitting: Neurology

## 2017-02-07 NOTE — Telephone Encounter (Signed)
-----   Message from Larey Seat, MD sent at 02/04/2017 12:26 PM EST ----- Unclear why the HST device was used from 9 PM through 4 AM, the time this patient is usually not asleep. This may be intentional. AHI was 23 > CPAP is indicated for this degree of apnea, but he could also reduce apnea by weight loss, by a dental device ( no hypoxemia noted) and his fatigue can improve with better sleep routines.   In order to titrate the patient to CPAP , I will order an attended sleep study, given his significant co-morbidities ( smoker, opiate pain med, and  GERD/ gastroparesis)  which found no reflection in the HST recording.

## 2017-02-07 NOTE — Telephone Encounter (Signed)
I called pt. I advised pt that Dr. Dohmeier reviewed their sleep study results and found that has moderate sleep apnea and recommends that pt be treated with a cpap. Dr. Dohmeier recommends that pt return for a repeat sleep study in order to properly titrate the cpap and ensure a good mask fit. Pt is agreeable to returning for a titration study. I advised pt that our sleep lab will file with pt's insurance and call pt to schedule the sleep study when we hear back from the pt's insurance regarding coverage of this sleep study. Pt verbalized understanding of results. Pt had no questions at this time but was encouraged to call back if questions arise.  .   

## 2017-02-25 ENCOUNTER — Ambulatory Visit: Payer: Medicare HMO | Admitting: Podiatry

## 2017-03-04 ENCOUNTER — Ambulatory Visit: Payer: Medicare HMO | Admitting: Podiatry

## 2017-03-16 ENCOUNTER — Encounter: Payer: Self-pay | Admitting: Family Medicine

## 2017-03-16 ENCOUNTER — Ambulatory Visit (INDEPENDENT_AMBULATORY_CARE_PROVIDER_SITE_OTHER): Payer: Medicare HMO | Admitting: Family Medicine

## 2017-03-16 VITALS — BP 124/80 | HR 77 | Temp 97.2°F | Ht 73.0 in | Wt 264.2 lb

## 2017-03-16 DIAGNOSIS — E785 Hyperlipidemia, unspecified: Secondary | ICD-10-CM

## 2017-03-16 DIAGNOSIS — E1165 Type 2 diabetes mellitus with hyperglycemia: Secondary | ICD-10-CM

## 2017-03-16 DIAGNOSIS — G5602 Carpal tunnel syndrome, left upper limb: Secondary | ICD-10-CM | POA: Diagnosis not present

## 2017-03-16 DIAGNOSIS — G8929 Other chronic pain: Secondary | ICD-10-CM | POA: Diagnosis not present

## 2017-03-16 DIAGNOSIS — I10 Essential (primary) hypertension: Secondary | ICD-10-CM | POA: Diagnosis not present

## 2017-03-16 DIAGNOSIS — K219 Gastro-esophageal reflux disease without esophagitis: Secondary | ICD-10-CM

## 2017-03-16 DIAGNOSIS — I152 Hypertension secondary to endocrine disorders: Secondary | ICD-10-CM

## 2017-03-16 DIAGNOSIS — E1169 Type 2 diabetes mellitus with other specified complication: Secondary | ICD-10-CM

## 2017-03-16 DIAGNOSIS — M79671 Pain in right foot: Secondary | ICD-10-CM

## 2017-03-16 DIAGNOSIS — E1159 Type 2 diabetes mellitus with other circulatory complications: Secondary | ICD-10-CM

## 2017-03-16 LAB — CMP14+EGFR
ALT: 25 IU/L (ref 0–44)
AST: 18 IU/L (ref 0–40)
Albumin/Globulin Ratio: 1.8 (ref 1.2–2.2)
Albumin: 4.4 g/dL (ref 3.5–5.5)
Alkaline Phosphatase: 88 IU/L (ref 39–117)
BUN/Creatinine Ratio: 18 (ref 9–20)
BUN: 19 mg/dL (ref 6–24)
Bilirubin Total: 0.3 mg/dL (ref 0.0–1.2)
CO2: 19 mmol/L — ABNORMAL LOW (ref 20–29)
Calcium: 9.5 mg/dL (ref 8.7–10.2)
Chloride: 104 mmol/L (ref 96–106)
Creatinine, Ser: 1.05 mg/dL (ref 0.76–1.27)
GFR calc Af Amer: 95 mL/min/{1.73_m2} (ref >59)
GFR calc non Af Amer: 82 mL/min/{1.73_m2} (ref >59)
Globulin, Total: 2.4 g/dL (ref 1.5–4.5)
Glucose: 122 mg/dL — ABNORMAL HIGH (ref 65–99)
Potassium: 4.5 mmol/L (ref 3.5–5.2)
Sodium: 140 mmol/L (ref 134–144)
Total Protein: 6.8 g/dL (ref 6.0–8.5)

## 2017-03-16 LAB — BAYER DCA HB A1C WAIVED: HB A1C (BAYER DCA - WAIVED): 7 % — ABNORMAL HIGH (ref <7.0)

## 2017-03-16 MED ORDER — OXYCODONE HCL 10 MG PO TABS: 10.0000 mg | ORAL_TABLET | Freq: Four times a day (QID) | ORAL | 0 refills | Status: DC

## 2017-03-16 MED ORDER — OXYCODONE HCL 10 MG PO TABS: 10.0000 mg | ORAL_TABLET | Freq: Four times a day (QID) | ORAL | 0 refills | Status: AC | PRN

## 2017-03-16 NOTE — Progress Notes (Signed)
BP 124/80   Pulse 77   Temp (!) 97.2 F (36.2 C) (Oral)   Ht 6' 1"  (1.854 m)   Wt 264 lb 3.2 oz (119.8 kg)   BMI 34.86 kg/m    Subjective:    Patient ID: Timothy Bouche., male    DOB: 10/13/1965, 52 y.o.   MRN: [id number]  HPI: Timothy Gains. is a 52 y.o. male presenting on 03/16/2017 for Diabetes (3 month follow up) and Hypertension   HPI Type 2 diabetes mellitus Patient comes in today for recheck of his diabetes. Patient has been currently taking Jardiance and glipizide and metformin, his last A1c was 7.0. Patient is currently on an ACE inhibitor/ARB. Patient has seen an ophthalmologist this year. Patient denies any new issues with their feet.  Patient has chronic feet problem with calluses but denies any wounds or open sores and has numbness completely on his right foot due to trauma in the past and deformed ankles on both sides.  He sees a podiatrist for this  Hypertension Patient is currently on lisinopril, and their blood pressure today is 24/80. Patient denies any lightheadedness or dizziness. Patient denies headaches, blurred vision, chest pains, shortness of breath, or weakness. Denies any side effects from medication and is content with current medication.   GERD Patient is currently on no medication currently.  She denies any major symptoms or abdominal pain or belching or burping. She denies any blood in her stool or lightheadedness or dizziness.   Hyperlipidemia Patient is coming in for recheck of his hyperlipidemia. The patient is currently taking simvastatin. They deny any issues with myalgias or history of liver damage from it. They deny any focal numbness or weakness or chest pain.   Chronic pain in feet Patient sees podiatry for this, this is due to trauma the left both ankles slightly deformed and healed incorrectly.  Relevant past medical, surgical, family and social history reviewed and updated as indicated. Interim medical history since our last visit  reviewed. Allergies and medications reviewed and updated.  Review of Systems  Constitutional: Negative for chills and fever.  Respiratory: Negative for shortness of breath and wheezing.   Cardiovascular: Negative for chest pain and leg swelling.  Gastrointestinal: Negative for abdominal pain.  Musculoskeletal: Negative for back pain and gait problem.  Skin: Negative for rash.  Neurological: Negative for dizziness, weakness, light-headedness and numbness.  All other systems reviewed and are negative.   Per HPI unless specifically indicated above   Allergies as of 03/16/2017      Reactions   Fenofibrate Rash   Permament rash.      Medication List        Accurate as of 03/16/17 11:59 PM. Always use your most recent med list.          accu-chek softclix lancets Use as instructed   CINNAMON PO Take 1 capsule by mouth 2 (two) times daily.   empagliflozin 25 MG Tabs tablet Commonly known as:  JARDIANCE Take 25 mg by mouth daily.   fluticasone 50 MCG/ACT nasal spray Commonly known as:  FLONASE Place 1 spray into both nostrils 2 (two) times daily as needed for allergies or rhinitis.   glipiZIDE 10 MG 24 hr tablet Commonly known as:  GLUCOTROL XL Take 1 tablet (10 mg total) by mouth daily with breakfast.   glucose blood test strip Commonly known as:  ACCU-CHEK AVIVA Use as instructed   ketoconazole 2 % cream Commonly known as:  NIZORAL Apply 1 application  topically daily.   lisinopril 20 MG tablet Commonly known as:  PRINIVIL,ZESTRIL Take 1 tablet (20 mg total) by mouth daily.   metFORMIN 1000 MG tablet Commonly known as:  GLUCOPHAGE Take 1 tablet (1,000 mg total) by mouth 2 (two) times daily with a meal.   metoCLOPramide 10 MG tablet Commonly known as:  REGLAN Take 1 tablet (10 mg total) by mouth 2 (two) times daily.   Oxycodone HCl 10 MG Tabs Take 1 tablet (10 mg total) by mouth 4 (four) times daily as needed.   Oxycodone HCl 10 MG Tabs Take 1 tablet (10  mg total) by mouth 4 (four) times daily. Do not refill until 30 days from prescription date Start taking on:  04/16/2017   Oxycodone HCl 10 MG Tabs Take 1 tablet (10 mg total) by mouth 4 (four) times daily. Do not refill until 60 days from prescription date Start taking on:  05/16/2017   simvastatin 40 MG tablet Commonly known as:  ZOCOR Take 1 tablet (40 mg total) by mouth daily at 6 PM.          Objective:    BP 124/80   Pulse 77   Temp (!) 97.2 F (36.2 C) (Oral)   Ht 6' 1"  (1.854 m)   Wt 264 lb 3.2 oz (119.8 kg)   BMI 34.86 kg/m   Wt Readings from Last 3 Encounters:  03/16/17 264 lb 3.2 oz (119.8 kg)  01/19/17 255 lb (115.7 kg)  12/15/16 260 lb (117.9 kg)    Physical Exam  Constitutional: He is oriented to person, place, and time. He appears well-developed and well-nourished. No distress.  Eyes: Conjunctivae are normal. No scleral icterus.  Neck: Neck supple. No thyromegaly present.  Cardiovascular: Normal rate, regular rhythm, normal heart sounds and intact distal pulses.  No murmur heard. Pulmonary/Chest: Effort normal and breath sounds normal. No respiratory distress. He has no wheezes.  Musculoskeletal: Normal range of motion. He exhibits tenderness (Bilateral ankle pain and deformity). He exhibits no edema.  Lymphadenopathy:    He has no cervical adenopathy.  Neurological: He is alert and oriented to person, place, and time. Coordination normal.  Skin: Skin is warm and dry. No rash noted. He is not diaphoretic.  Psychiatric: He has a normal mood and affect. His behavior is normal.  Nursing note and vitals reviewed.   Results for orders placed or performed in visit on 03/16/17  Bayer DCA Hb A1c Waived  Result Value Ref Range   Bayer DCA Hb A1c Waived 7.0 (H) <7.0 %  CMP14+EGFR  Result Value Ref Range   Glucose 122 (H) 65 - 99 mg/dL   BUN 19 6 - 24 mg/dL   Creatinine, Ser 1.05 0.76 - 1.27 mg/dL   GFR calc non Af Amer 82 >59 mL/min/1.73   GFR calc Af Amer  95 >59 mL/min/1.73   BUN/Creatinine Ratio 18 9 - 20   Sodium 140 134 - 144 mmol/L   Potassium 4.5 3.5 - 5.2 mmol/L   Chloride 104 96 - 106 mmol/L   CO2 19 (L) 20 - 29 mmol/L   Calcium 9.5 8.7 - 10.2 mg/dL   Total Protein 6.8 6.0 - 8.5 g/dL   Albumin 4.4 3.5 - 5.5 g/dL   Globulin, Total 2.4 1.5 - 4.5 g/dL   Albumin/Globulin Ratio 1.8 1.2 - 2.2   Bilirubin Total 0.3 0.0 - 1.2 mg/dL   Alkaline Phosphatase 88 39 - 117 IU/L   AST 18 0 - 40 IU/L  ALT 25 0 - 44 IU/L  Microalbumin / creatinine urine ratio  Result Value Ref Range   Creatinine, Urine 52.4 Not Estab. mg/dL   Microalbumin, Urine <3.0 Not Estab. ug/mL   Microalb/Creat Ratio <5.7 0.0 - 30.0 mg/g creat      Assessment & Plan:   Problem List Items Addressed This Visit      Cardiovascular and Mediastinum   Hypertension associated with diabetes ([place])   Relevant Orders   CMP14+EGFR (Completed)   Microalbumin / creatinine urine ratio (Completed)     Digestive   GERD (gastroesophageal reflux disease)     Endocrine   Diabetes type 2, uncontrolled ([place])   Relevant Orders   Bayer DCA Hb A1c Waived (Completed)   CMP14+EGFR (Completed)   Microalbumin / creatinine urine ratio (Completed)   Hyperlipidemia associated with type 2 diabetes mellitus (HCC) - Primary     Other   Chronic foot pain   Relevant Medications   Oxycodone HCl 10 MG TABS   Oxycodone HCl 10 MG TABS (Start on 04/16/2017)   Oxycodone HCl 10 MG TABS (Start on 05/16/2017)    Other Visit Diagnoses    Left carpal tunnel syndrome       Patient did not want to go see surgery right now but will in the future.       Follow up plan: Return in about 3 months (around 06/16/2017), or if symptoms worsen or fail to improve, for Recheck diabetes.  Counseling provided for all of the vaccine components Orders Placed This Encounter  Procedures  . Bayer DCA Hb A1c Waived  . CMP14+EGFR  . Microalbumin / creatinine urine ratio    Caryl Pina, MD [facility] 03/20/2017, 9:59 PM

## 2017-03-17 LAB — MICROALBUMIN / CREATININE URINE RATIO
Creatinine, Urine: 52.4 mg/dL
Microalb/Creat Ratio: <5.7 mg/g creat (ref 0.0–30.0)
Microalbumin, Urine: <3 ug/mL

## 2017-04-15 ENCOUNTER — Other Ambulatory Visit: Payer: Self-pay | Admitting: Family Medicine

## 2017-04-15 ENCOUNTER — Telehealth: Payer: Self-pay | Admitting: Family Medicine

## 2017-04-15 DIAGNOSIS — M79671 Pain in right foot: Principal | ICD-10-CM

## 2017-04-15 DIAGNOSIS — G8929 Other chronic pain: Secondary | ICD-10-CM

## 2017-04-15 MED ORDER — OXYCODONE HCL 10 MG PO TABS: 10.0000 mg | ORAL_TABLET | Freq: Four times a day (QID) | ORAL | 0 refills | Status: DC

## 2017-04-15 NOTE — Telephone Encounter (Signed)
Phone call taken care of in a different encounter this encounter will now be closed

## 2017-04-15 NOTE — Progress Notes (Unsigned)
I sent over a new prescription because there is a hard date and they would let him fill it till tomorrow when he actually needed it today because of 31 days last month.

## 2017-04-20 ENCOUNTER — Ambulatory Visit (INDEPENDENT_AMBULATORY_CARE_PROVIDER_SITE_OTHER): Payer: Medicare HMO | Admitting: Neurology

## 2017-04-20 DIAGNOSIS — Z72821 Inadequate sleep hygiene: Secondary | ICD-10-CM

## 2017-04-20 DIAGNOSIS — G4733 Obstructive sleep apnea (adult) (pediatric): Secondary | ICD-10-CM | POA: Diagnosis not present

## 2017-04-20 DIAGNOSIS — G478 Other sleep disorders: Secondary | ICD-10-CM

## 2017-05-06 NOTE — Procedures (Signed)
PATIENT'S NAME:  Timothy Nelson, Timothy Nelson DOB:      1965-11-21      MR#:    [id number]     DATE OF RECORDING: 04/20/2017 REFERRING M.D.:  Caryl Pina, M.D. Study Performed:  Split-Night Titration Study HISTORY:  Patient returning for a SPLIT / CPAP Titration sleep study scored at 4%, following a HST by watch pat  from 02/02/17, which revealed an AHI of 23/h, with SpO2 nadir of 83%.The HST results were not in context with the clinical reported symptoms. The patient endorsed excessive fatigue, snoring, and OCD symptoms, vocal tics. He endorsed the Epworth Sleepiness Scale at 12 points.  The patient's weight 255 pounds with a height of 73 (inches), resulting in a BMI of 33.9 kg/m2.The patient's neck circumference measured 20 inches.  CURRENT MEDICATIONS: Federico Flake, Flonase, Glucotrol, Nizoral, Prinivil, Reglan, Oxycodone, Zocor. PROCEDURE:  This is a multichannel digital polysomnogram utilizing the SomnoStar 11.2 system.  Electrodes and sensors were applied and monitored per AASM Specifications.   EEG, EOG, Chin and Limb EMG, were sampled at 200 Hz.  ECG, Snore and Nasal Pressure, Thermal Airflow, Respiratory Effort, CPAP Flow and Pressure, Oximetry was sampled at 50 Hz. Digital video and audio were recorded.      BASELINE STUDY WITHOUT CPAP RESULTS: Lights Out was at 20:45 and Lights On at 05:19.  Total recording time (TRT) was 69.5, with a total sleep time (TST) of 48 minutes.   The patient's sleep latency was 11 minutes.  REM latency was 0 minutes.  The sleep efficiency was poor at 69.1 %.    SLEEP ARCHITECTURE: WASO (Wake after sleep onset) was 10 minutes, Stage N1 was 9.5 minutes, Stage N2 was 38.5 minutes, Stage N3 was 0 minutes and Stage R (REM sleep) was 0 minutes.  The percentages were Stage N1 19.8%, Stage N2 80.2%, Stage N3 0% and Stage R (REM sleep) 0%.   RESPIRATORY ANALYSIS:  There were a total of 21 respiratory events:  1 obstructive apnea, 0 central apneas and 20 hypopneas with a  hypopnea index of 25. The patient also had 0 respiratory event related arousals (RERAs). Snoring was noted. The total APNEA/HYPOPNEA INDEX (AHI) was 26.3 /hour and the total RESPIRATORY DISTURBANCE INDEX was 26.3 /hour.  0 events occurred in REM sleep and 40 events in NREM. The REM AHI was 0, /hour versus a non-REM AHI of 26.3 /hour. The patient spent 195.5 minutes sleep time in the supine position 254 minutes in non-supine. The supine AHI was 24.0 /hour versus a non-supine AHI of 26.7 /hour.  OXYGEN SATURATION & C02:  The wake baseline 02 saturation was 94%, with the lowest being 86%. Time spent below 89% saturation equaled 5 minutes, the average [place] CO2 during sleep was not measured.  PERIODIC LIMB MOVEMENTS: The patient had a total of 34 Periodic Limb Movements.  The Periodic Limb Movement (PLM) index was 42.5 /hour and the PLM Arousal index was 1.3 /hour.  The arousals were noted as: 10 were spontaneous, 1 associated with PLMs, and 13 were associated with respiratory events.   The patient took one bathroom break. Snoring was noted EKG was in keeping with normal sinus rhythm (NSR), some tachycardia.    TITRATION STUDY WITH CPAP RESULTS: CPAP was initiated at 5.0 cmH20 with heated humidity per AASM split night standards and pressure was advanced to 11.0 cmH20 because of hypopneas, apneas and desaturations.  At a PAP pressure of 11.0 cmH20, there was a reduction of the AHI to 0.0 /hour.  Total recording time (TRT) was 445.5 minutes, with a total sleep time (TST) of 401 minutes. The patient's sleep latency was 1 minutes. REM latency was 36.5 minutes.  The sleep efficiency was 90. %.    SLEEP ARCHITECTURE: Wake after sleep was 43 minutes, Stage N1 18.5 minutes, Stage N2 266.5 minutes, Stage N3 27 minutes and Stage R (REM sleep) 89 minutes. The percentages were: Stage N1 4.6%, Stage N2 66.5%, Stage N3 6.7% and Stage R (REM sleep) 22.2%.  RESPIRATORY ANALYSIS:  There were a total of 34  respiratory events: 11 obstructive apneas, 0 central apneas and 0 mixed apneas with a total of 11 apneas and an apnea index (AI) of 1.6. There were 23 hypopneas with a hypopnea index of 3.4 /hour. The patient also had 0 respiratory event related arousals (RERAs). The total APNEA/HYPOPNEA INDEX (AHI) was 5.1 /hour and the total RESPIRATORY DISTURBANCE INDEX was 5.1 /hour.  8 events occurred in REM sleep and 26 events in NREM. The REM AHI was 5.393259 /hour versus a non-REM AHI of 5 /hour. REM sleep was achieved on a pressure of  cm/h2o (AHI was  .) The patient spent 47% of total sleep time in the supine position. The supine AHI was 3.8 /hour, versus a non-supine AHI of 6.2/hour.  OXYGEN SATURATION & C02:  The wake baseline 02 saturation was 90%, with the lowest being 88%. Time spent below 89% saturation equaled 4 minutes.  PERIODIC LIMB MOVEMENTS:    The patient had a total of 150 Periodic Limb Movements. The Periodic Limb Movement (PLM) index was 22.4 /hour and the PLM Arousal index was 4.6 /hour. The arousals were noted as: 19 were spontaneous, 31 were associated with PLMs, and 12 were associated with respiratory events. Post-study, the patient indicated that sleep was better than usual.  POLYSOMNOGRAPHY IMPRESSION :   1. Moderate Obstructive Sleep Apnea (OSA) as previously documented on watch Pat. Responded well to CPAP and was titrated to 11 cm water, but likely can tolerate 12 cm water.  Primary Snoring responded to CPAP as well. The patient was fitted with a Fisher Paykel Simplus large FFM apparatus 2. Breakthrough PLMs/ Periodic Limb Movement Disorder (PLMD).  RECOMMENDATIONS: Auto-titration capable CPAP machine to be set for a pressure window from 7 through 12 cm water, and heated humidity. The patient was fitted with a Fisher Paykel Simplus large FFM apparatus A follow up appointment will be scheduled in the Sleep Clinic at [facility].     I certify that I have reviewed  the entire raw data recording prior to the issuance of this report in accordance with the Standards of Accreditation of the American Academy of Sleep Medicine (AASM)     Larey Seat, M.D.     05-06-2017  Diplomat, American Board of Psychiatry and Neurology  Diplomat, [place] [facility] Medical Director, [place] [facility] at [facility]

## 2017-05-06 NOTE — Addendum Note (Signed)
Addended by: Larey Seat on: 05/06/2017 01:43 PM   Modules accepted: Orders

## 2017-05-09 ENCOUNTER — Telehealth: Payer: Self-pay | Admitting: Neurology

## 2017-05-09 NOTE — Telephone Encounter (Signed)
I called pt. I advised pt that Dr. Brett Fairy reviewed their sleep study results and found that pt has sleep apnea. Dr. Brett Fairy recommends that pt starts an auto CPAP. I reviewed PAP compliance expectations with the pt. Pt is agreeable to starting a CPAP. I advised pt that an order will be sent to a DME, [facility], and [facility] will call the pt within about one week after they file with the pt's insurance. [facility] will show the pt how to use the machine, fit for masks, and troubleshoot the CPAP if needed. A follow up appt was made for insurance purposes with Cecille Rubin, NP on Aug 18, 2017 at 7:45 am. Pt verbalized understanding to arrive 15 minutes early and bring their CPAP. A letter with all of this information in it will be mailed to the pt as a reminder. I verified with the pt that the address we have on file is correct. Pt verbalized understanding of results. Pt had no questions at this time but was encouraged to call back if questions arise.

## 2017-05-09 NOTE — Telephone Encounter (Signed)
-----   Message from Larey Seat, MD sent at 05/06/2017  1:43 PM EDT ----- Moderate Obstructive Sleep Apnea (OSA) as previously  documented on watch Pat. Responded well to CPAP and was titrated  to 11 cm water, but likely can tolerate 12 cm water.  Primary Snoring responded to CPAP as well. The patient was fitted  with a Fisher Paykel Simplus large FFM apparatus 2. Breakthrough PLMs/ Periodic Limb Movement Disorder (PLMD).  RECOMMENDATIONS: use of an Auto-titration capable CPAP machine to be set  for a pressure window from 7 through 12 cm water, and heated  humidity. The patient was fitted with a Fisher Paykel Simplus  large FFM apparatus

## 2017-05-19 ENCOUNTER — Ambulatory Visit: Payer: Medicare HMO | Admitting: Podiatry

## 2017-05-19 ENCOUNTER — Encounter: Payer: Self-pay | Admitting: Podiatry

## 2017-05-19 DIAGNOSIS — Q828 Other specified congenital malformations of skin: Secondary | ICD-10-CM

## 2017-05-19 DIAGNOSIS — L97512 Non-pressure chronic ulcer of other part of right foot with fat layer exposed: Secondary | ICD-10-CM

## 2017-05-19 DIAGNOSIS — E1149 Type 2 diabetes mellitus with other diabetic neurological complication: Secondary | ICD-10-CM

## 2017-05-19 MED ORDER — CEPHALEXIN 500 MG PO CAPS: 500.0000 mg | ORAL_CAPSULE | Freq: Three times a day (TID) | ORAL | 0 refills | Status: DC

## 2017-05-19 MED ORDER — MUPIROCIN 2 % EX OINT: 1.0000 "application " | TOPICAL_OINTMENT | Freq: Two times a day (BID) | CUTANEOUS | 2 refills | Status: DC

## 2017-05-19 NOTE — Progress Notes (Signed)
Subjective: Timothy Nelson, 52 year old male, presents today for continued care of painful calluses to both of his feet.  He states his wife has been treating the calluses.  He has not had any swelling or redness he denies any openings that he is noticed.  Denies any drainage or pus.  He has no other concerns today. No acute changes since last appointment, and no other complaints at this time. He has no new concerns today.   Objective: AAO x3, NAD DP/PT pulses palpable bilaterally, CRT less than 3 seconds Sensation decreased with SWMF Bilateral submetatarsal 5 hyperkeratotic lesion. Upon debridement of the left no underlying ulceration, drainage or any signs of infection.  Upon debridement of the right submetatarsal 5 callus there was underlying ulceration measuring approximate 1.7 x 0.6 cm and superficial with a granular wound base.  Is no probing to bone, undermining or tunneling.  There is no surrounding erythema, ascending sialitis.  There is no fluctuation or crepitation.  There is no malodor. Prominent metatarsal heads bilaterally. Hammertoes present No edema, erythema, increase in warmth to bilateral lower extremities.  No other open lesions or pre-ulcerative lesions.  No pain with calf compression, swelling, warmth, erythema  Assessment: Right foot ulceration, left foot pre-ulcerative callus  Plan: -All treatment options discussed with the patient including all alternatives, risks, complications.  -On the right foot the hyperkeratotic tissue was sharply debrided with a #312 blade scalpel  to reveal underlying ulceration.  This was debrided to healthy, granular tissue without any complications.  As it would rub his ankle.  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  I debrided the left hyperkeratotic lesion to the any complications of bleeding.  Mupirocin ointment was ordered as well as Keflex.  Offloading pads were dispensed.  Recommended surgical  shoe but he states that he would not wear this -Monitor for any clinical signs or symptoms of infection and directed to call the office immediately should any occur or go to the ER. -Follow-up in 2 weeks or sooner if needed.   Trula Slade DPM

## 2017-06-01 ENCOUNTER — Other Ambulatory Visit: Payer: Self-pay | Admitting: Family Medicine

## 2017-06-02 ENCOUNTER — Ambulatory Visit: Payer: Medicare HMO | Admitting: Podiatry

## 2017-06-02 DIAGNOSIS — G4733 Obstructive sleep apnea (adult) (pediatric): Secondary | ICD-10-CM | POA: Diagnosis not present

## 2017-06-07 ENCOUNTER — Encounter: Payer: Self-pay | Admitting: Podiatry

## 2017-06-07 ENCOUNTER — Ambulatory Visit: Payer: Medicare HMO | Admitting: Podiatry

## 2017-06-07 VITALS — Temp 98.7°F

## 2017-06-07 DIAGNOSIS — L97512 Non-pressure chronic ulcer of other part of right foot with fat layer exposed: Secondary | ICD-10-CM | POA: Diagnosis not present

## 2017-06-07 DIAGNOSIS — E1149 Type 2 diabetes mellitus with other diabetic neurological complication: Secondary | ICD-10-CM

## 2017-06-07 NOTE — Patient Instructions (Signed)
Instructions for Wound Care  The most important step to healing a foot wound is to reduce the pressure on your foot - it is extremely important to stay off your foot as much as possible and wear the shoe/boot as instructed.  Cleanse your foot with saline wash or warm soapy water (dial antibacterial soap or similar).  Blot dry.  Apply prescribed medication to your wound and cover with gauze and a bandage.  May hold bandage in place with Coban (self sticky wrap), Ace bandage or tape.  You may find dressing supplies at your local [employer], [employer], drug store or medical supply store.  Monitor for any signs/symptoms of infection. If there is any increase in redness, red streaks, increase in drainage, warmth to your foot please give us a call. Also, if you start to run a fever or have "flu-like" symptoms that can also be a sign of infection. Call the office immediately if any occur or go directly to the emergency room.   If you have any questions, please feel free to give us a call at [phone] or if you are on "MyChart" you can always send me a message if needed.    

## 2017-06-10 NOTE — Progress Notes (Signed)
Subjective: Timothy Nelson, 52 year old male, presents today for follow-up evaluation of a wound on the right foot.  He states he has not been managing the area.  He denies any swelling or redness or drainage.  He stopped it because he thought the area is healed.  Denies any swelling or redness to his feet.  No other concerns. No acute changes since last appointment, and no other complaints at this time. He has no new concerns today.   Objective: AAO x3, NAD DP/PT pulses palpable bilaterally, CRT less than 3 seconds Sensation decreased with SWMF Bilateral submetatarsal 5 hyperkeratotic lesion.  Upon debridement superficial wound is still present on the right foot submetatarsal 5.  There is no probing, undermining or tunneling.  There is no surrounding erythema, ascending cellulitis.  There is no fluctuation or crepitation or any malodor.  After debridement measures are present 0.6 x 0.6 cm and is smaller compared to last appointment.  No clinical signs of infection is noted today. Minimal hyperkeratotic lesion on the left foot which was debrided to any underlying ulceration, drainage or any signs of infection. No other open lesions or pre-ulcerative lesions.  No pain with calf compression, swelling, warmth, erythema  Assessment: Right foot ulceration, without signs of infection  Plan: -All treatment options discussed with the patient including all alternatives, risks, complications.  -Sharply debrided the wound today to healthy, granular tissue to remove all nonviable tissue with a #312 blade scalpel in the right foot.  I would continue with antibiotic ointment dressing changes to the area daily and offloading all times.  Currently no signs of infection continue to monitor closely.  He is also afebrile today his temperature is 98.7.  -Monitor for any clinical signs or symptoms of infection and directed to call the office immediately should any occur or go to the ER.  Return in about 2 weeks (around  06/21/2017).  Trula Slade DPM

## 2017-06-15 ENCOUNTER — Encounter: Payer: Self-pay | Admitting: Family Medicine

## 2017-06-15 ENCOUNTER — Ambulatory Visit (INDEPENDENT_AMBULATORY_CARE_PROVIDER_SITE_OTHER): Payer: Medicare HMO | Admitting: Family Medicine

## 2017-06-15 VITALS — BP 102/71 | HR 78 | Temp 97.0°F | Ht 73.0 in | Wt 260.0 lb

## 2017-06-15 DIAGNOSIS — E1159 Type 2 diabetes mellitus with other circulatory complications: Secondary | ICD-10-CM

## 2017-06-15 DIAGNOSIS — E785 Hyperlipidemia, unspecified: Secondary | ICD-10-CM

## 2017-06-15 DIAGNOSIS — K219 Gastro-esophageal reflux disease without esophagitis: Secondary | ICD-10-CM | POA: Diagnosis not present

## 2017-06-15 DIAGNOSIS — G8929 Other chronic pain: Secondary | ICD-10-CM | POA: Diagnosis not present

## 2017-06-15 DIAGNOSIS — I10 Essential (primary) hypertension: Secondary | ICD-10-CM | POA: Diagnosis not present

## 2017-06-15 DIAGNOSIS — E1169 Type 2 diabetes mellitus with other specified complication: Secondary | ICD-10-CM | POA: Diagnosis not present

## 2017-06-15 DIAGNOSIS — Z6835 Body mass index (BMI) 35.0-35.9, adult: Secondary | ICD-10-CM | POA: Diagnosis not present

## 2017-06-15 DIAGNOSIS — E661 Drug-induced obesity: Secondary | ICD-10-CM

## 2017-06-15 DIAGNOSIS — E1165 Type 2 diabetes mellitus with hyperglycemia: Secondary | ICD-10-CM | POA: Diagnosis not present

## 2017-06-15 DIAGNOSIS — M79671 Pain in right foot: Secondary | ICD-10-CM

## 2017-06-15 MED ORDER — OXYCODONE HCL 10 MG PO TABS: 10.0000 mg | ORAL_TABLET | Freq: Four times a day (QID) | ORAL | 0 refills | Status: DC | PRN

## 2017-06-15 MED ORDER — OXYCODONE HCL 10 MG PO TABS: 10.0000 mg | ORAL_TABLET | Freq: Four times a day (QID) | ORAL | 0 refills | Status: DC

## 2017-06-15 MED ORDER — OXYCODONE HCL 10 MG PO TABS: 10.0000 mg | ORAL_TABLET | Freq: Four times a day (QID) | ORAL | 0 refills | Status: AC

## 2017-06-15 NOTE — Progress Notes (Signed)
BP 102/71   Pulse 78   Temp (!) 97 F (36.1 C) (Oral)   Ht 6\' 1"  (1.854 m)   Wt 260 lb (117.9 kg)   BMI 34.30 kg/m    Subjective:    Patient ID: Timothy Bouche., male    DOB: [birth year], 52 y.o.   MRN: [id number]  HPI: Timothy Petite. is a 52 y.o. male presenting on 06/15/2017 for Refill Oxycodone   HPI Hypertension Patient is currently on lisinopril, and their blood pressure today is 102/71. Patient denies any lightheadedness or dizziness. Patient denies headaches, blurred vision, chest pains, shortness of breath, or weakness. Denies any side effects from medication and is content with current medication.   Hyperlipidemia Patient is coming in for recheck of his hyperlipidemia. The patient is currently taking simvastatin. They deny any issues with myalgias or history of liver damage from it. They deny any focal numbness or weakness or chest pain.   GERD Patient is currently on Reglan.  She denies any major symptoms or abdominal pain or belching or burping. She denies any blood in her stool or lightheadedness or dizziness.   Type 2 diabetes mellitus Patient comes in today for recheck of his diabetes. Patient has been currently taking metformin and Jardiance and glipizide. Patient is currently on an ACE inhibitor/ARB. Patient has not seen an ophthalmologist this year. Patient denies any new issues with their feet.  Patient has deformed ankles and has a callus that podiatry is seen to get healed on his right plantar surface near the base of his fifth right toe.  He is completely numb in his right foot but has all sensation in his left foot from that injury.  Chronic feet pain Patient comes in complaining of chronic foot pain on bilateral in his ankles and his feet.  He has deformed ankles because of falling off a roof when he was younger and shattering both of his ankles which is left and deformed and lives in with pain when he walks.  He currently takes that oxycodone for this and it  helps manage this.  He also sees a podiatry for helping with his walking and with sores on his feet.  He denies any worsening of the pain and says that it is mostly under control.  Relevant past medical, surgical, family and social history reviewed and updated as indicated. Interim medical history since our last visit reviewed. Allergies and medications reviewed and updated.  Review of Systems  Constitutional: Negative for chills and fever.  Respiratory: Negative for shortness of breath and wheezing.   Cardiovascular: Negative for chest pain and leg swelling.  Musculoskeletal: Positive for arthralgias. Negative for back pain and gait problem.  Skin: Positive for wound (Podiatrist is working heel wound on right foot). Negative for color change and rash.  All other systems reviewed and are negative.   Per HPI unless specifically indicated above   Allergies as of 06/15/2017      Reactions   Fenofibrate Rash   Permament rash.      Medication List        Accurate as of 06/15/17 11:35 AM. Always use your most recent med list.          accu-chek softclix lancets Use as instructed   CINNAMON PO Take 1 capsule by mouth 2 (two) times daily.   empagliflozin 25 MG Tabs tablet Commonly known as:  JARDIANCE Take 25 mg by mouth daily.   fluticasone 50 MCG/ACT nasal spray Commonly known as:  FLONASE Place 1 spray into both nostrils 2 (two) times daily as needed for allergies or rhinitis.   glipiZIDE 10 MG 24 hr tablet Commonly known as:  GLUCOTROL XL Take 1 tablet (10 mg total) by mouth daily with breakfast.   glucose blood test strip Commonly known as:  ACCU-CHEK AVIVA Use as instructed   ketoconazole 2 % cream Commonly known as:  NIZORAL Apply 1 application topically daily.   lisinopril 20 MG tablet Commonly known as:  PRINIVIL,ZESTRIL Take 1 tablet (20 mg total) by mouth daily.   metFORMIN 1000 MG tablet Commonly known as:  GLUCOPHAGE Take 1 tablet (1,000 mg total) by  mouth 2 (two) times daily with a meal.   metoCLOPramide 10 MG tablet Commonly known as:  REGLAN TAKE 1 TABLET TWICE DAILY   mupirocin ointment 2 % Commonly known as:  BACTROBAN Apply 1 application topically 2 (two) times daily.   Oxycodone HCl 10 MG Tabs Take 1 tablet (10 mg total) by mouth 4 (four) times daily.   Oxycodone HCl 10 MG Tabs Take 1 tablet (10 mg total) by mouth 4 (four) times daily. Do not refill until 60 days from prescription date   simvastatin 40 MG tablet Commonly known as:  ZOCOR Take 1 tablet (40 mg total) by mouth daily at 6 PM.          Objective:    BP 102/71   Pulse 78   Temp (!) 97 F (36.1 C) (Oral)   Ht 6\' 1"  (1.854 m)   Wt 260 lb (117.9 kg)   BMI 34.30 kg/m   Wt Readings from Last 3 Encounters:  06/15/17 260 lb (117.9 kg)  03/16/17 264 lb 3.2 oz (119.8 kg)  01/19/17 255 lb (115.7 kg)    Physical Exam  Constitutional: He is oriented to person, place, and time. He appears well-developed and well-nourished. No distress.  Eyes: Conjunctivae are normal. No scleral icterus.  Neck: Neck supple. No thyromegaly present.  Cardiovascular: Normal rate, regular rhythm, normal heart sounds and intact distal pulses.  No murmur heard. Pulmonary/Chest: Effort normal and breath sounds normal. No respiratory distress. He has no wheezes.  Musculoskeletal: Normal range of motion. He exhibits no edema.  Lymphadenopathy:    He has no cervical adenopathy.  Neurological: He is alert and oriented to person, place, and time. Coordination normal.  Skin: Skin is warm and dry. No rash noted. He is not diaphoretic.  1.5 cm round ulceration on the palmar surface of his right foot near the base of his fifth toe  Psychiatric: He has a normal mood and affect. His behavior is normal.  Nursing note and vitals reviewed.      Assessment & Plan:   Problem List Items Addressed This Visit      Cardiovascular and Mediastinum   Hypertension associated with diabetes ([place])  - Primary     Digestive   GERD (gastroesophageal reflux disease)     Endocrine   Diabetes type 2, uncontrolled (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  Counseling provided for all of the vaccine components No orders of the defined types were placed in this encounter.   Caryl Pina, MD [facility] 06/15/2017, 11:35 AM

## 2017-06-17 ENCOUNTER — Ambulatory Visit: Payer: Medicare HMO | Admitting: Family Medicine

## 2017-06-28 ENCOUNTER — Encounter: Payer: Self-pay | Admitting: Podiatry

## 2017-06-28 ENCOUNTER — Ambulatory Visit: Payer: Medicare HMO | Admitting: Podiatry

## 2017-06-28 DIAGNOSIS — L97512 Non-pressure chronic ulcer of other part of right foot with fat layer exposed: Secondary | ICD-10-CM | POA: Diagnosis not present

## 2017-06-28 MED ORDER — CEPHALEXIN 500 MG PO CAPS: 500.0000 mg | ORAL_CAPSULE | Freq: Three times a day (TID) | ORAL | 0 refills | Status: DC

## 2017-06-28 NOTE — Progress Notes (Signed)
Subjective: Mr. Dubray, 52 year old male, presents today for follow-up evaluation of a wound on the right foot. He states that the wound is doing about the same. He states that his wife looks at the wound. He states that the wound still has some bloody drainage. No pus that he has noted. No increase in swelling or redness to his feet.  He has no other concerns today.  He states the wound is still open and "I blame the doctor when things don't go right".  He does not look at them in a daily basis and continues to wear his regular shoe.  He has a surgical shoe at home with offloading that we have given to him previously he does not wear this. When asking if he has had any fevers or chills or anything else he states "my wife says I am cold hearted".   Objective: AAO x3, NAD DP/PT pulses palpable bilaterally, CRT less than 3 seconds Sensation decreased with SWMF On the right foot submetatarsal 5 there is a hyperkeratotic lesion and upon debridement there is still some clear drainage coming from the area there is no purulence.  There is macerated tissue.  Upon debridement the wound itself measures 0.4 x 0.3 cm and is superficial with a granular wound base.  There is no probing, undermining or tunneling.  There is no surrounding erythema, ascending sialitis.  There is no fluctuation or crepitation. No other open lesions or pre-ulcerative lesions.  No pain with calf compression, swelling, warmth, erythema  Assessment: Right foot ulceration  Plan: -All treatment options discussed with the patient including all alternatives, risks, complications.  -Sharply debrided the wound today to healthy, granular tissue to remove all nonviable tissue with a #312 blade scalpel in the right foot.  I would continue with betadine dressing changes to the area daily and offloading all times.  I did prescribe keflex given the clear drainage.  I strongly encouraged him to wear the surgical shoe with offloading.  We had a long  discussion regards to why the wound is not healing as he continues to do a lot of walking with his regular shoe and given the pressure point and neuropathy.  If he is no pain with a surgical shoe I did dispense offloading pads for him but I do recommend a surgical shoe. If he would like another opinion would refer to the wound care center.  -Monitor for any clinical signs or symptoms of infection and directed to call the office immediately should any occur or go to the ER.  Return in about 2 weeks (around 06/21/2017).  Trula Slade DPM

## 2017-07-03 DIAGNOSIS — G4733 Obstructive sleep apnea (adult) (pediatric): Secondary | ICD-10-CM | POA: Diagnosis not present

## 2017-07-14 ENCOUNTER — Ambulatory Visit: Payer: Medicare HMO | Admitting: Podiatry

## 2017-07-27 ENCOUNTER — Encounter (INDEPENDENT_AMBULATORY_CARE_PROVIDER_SITE_OTHER): Payer: Self-pay | Admitting: Orthopedic Surgery

## 2017-07-27 ENCOUNTER — Ambulatory Visit (INDEPENDENT_AMBULATORY_CARE_PROVIDER_SITE_OTHER): Payer: Medicare HMO | Admitting: Orthopedic Surgery

## 2017-07-27 ENCOUNTER — Ambulatory Visit (INDEPENDENT_AMBULATORY_CARE_PROVIDER_SITE_OTHER): Payer: Medicare HMO

## 2017-07-27 VITALS — Ht 73.0 in | Wt 255.0 lb

## 2017-07-27 DIAGNOSIS — L97511 Non-pressure chronic ulcer of other part of right foot limited to breakdown of skin: Secondary | ICD-10-CM | POA: Diagnosis not present

## 2017-07-27 DIAGNOSIS — M79671 Pain in right foot: Secondary | ICD-10-CM

## 2017-07-28 ENCOUNTER — Telehealth: Payer: Self-pay | Admitting: Family Medicine

## 2017-07-28 ENCOUNTER — Encounter (INDEPENDENT_AMBULATORY_CARE_PROVIDER_SITE_OTHER): Payer: Self-pay | Admitting: Orthopedic Surgery

## 2017-07-28 NOTE — Telephone Encounter (Signed)
appt scheduled Pt notified 

## 2017-07-28 NOTE — Progress Notes (Signed)
Office Visit Note   Patient: Timothy Nelson.           Date of Birth: Jan 17, 1965           MRN: [id number] Visit Date: 07/27/2017              Requested by: Dettinger, Fransisca Kaufmann, MD [place], [place] [id number] PCP: Dettinger, Fransisca Kaufmann, MD  Chief Complaint  Patient presents with  . Right Foot - Open Wound      HPI: Patient is a 52 year old gentleman who was seen for initial evaluation for an ulcer right lower extremity fifth metatarsal head.  Patient states he has been seeing Dr. Carman Ching and podiatry and has not been happy with his care.  He is been using diabetic shoes but does not wear them because he states they wear out he is currently wearing crocs.  Patient is status post open reduction internal fixation for a pilon fracture of the right ankle with collapse.  Patient is a type II diabetic.  Patient has been on doxycycline for over 4 weeks.  Assessment & Plan: Visit Diagnoses:  1. Pain in right foot   2. Right foot ulcer, limited to breakdown of skin ([place])     Plan: Discussed with the patient this is a complex problem.  With the infection of the fifth metatarsal head patient most likely will require an amputation of the fifth ray.  With his varus collapse of the hindfoot patient would require a valgus osteotomy of the hindfoot to provide a plantar grade foot.  Discussed without the osteotomy patient would continue to have chronic ulcers over the lateral border of his foot.  Follow-Up Instructions: Return in about 1 month (around 08/24/2017).   Ortho Exam  Patient is alert, oriented, no adenopathy, well-dressed, normal affect, normal respiratory effort. Examination patient has a good dorsalis pedis pulse with standing he has a fixed varus hindfoot with all the weight over the lateral column of his foot and on the fifth metatarsal head.  The first metatarsal [place] is plantarflexed it does not touch the ground.  After informed consent a 10 blade knife was used to  debride the skin and soft tissue from the fifth metatarsal head ulcer the ulcer is 2 cm in diameter 5 mm deep.  This does probe down to bone.  Imaging: No results found. No images are attached to the encounter.  Labs: Lab Results  Component Value Date   HGBA1C 7.0 12/12/2014   HGBA1C 7.1 09/11/2014     Lab Results  Component Value Date   ALBUMIN 4.4 03/16/2017   ALBUMIN 4.4 12/15/2016   ALBUMIN 4.6 09/16/2016    Body mass index is 33.64 kg/m.  Orders:  Orders Placed This Encounter  Procedures  . XR Foot Complete Right   No orders of the defined types were placed in this encounter.    Procedures: No procedures performed  Clinical Data: No additional findings.  ROS:  All other systems negative, except as noted in the HPI. Review of Systems  Objective: Vital Signs: Ht 6\' 1"  (1.854 m)   Wt 255 lb (115.7 kg)   BMI 33.64 kg/m   Specialty Comments:  No specialty comments available.  PMFS History: Patient Active Problem List   Diagnosis Date Noted  . Poor sleep hygiene 02/04/2017  . 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 Tobacco use disorder 01/19/2017  . Obsessive compulsive disorder 01/19/2017  . Class 2 drug-induced obesity with  serious comorbidity and body mass index (BMI) of 35.0 to 35.9 in adult 01/19/2017  . Snoring 01/19/2017  . Excessive daytime sleepiness 01/19/2017  . GERD (gastroesophageal reflux disease) 01/09/2015  . Diabetes type 2, uncontrolled ([place]) 09/11/2014  . Hypertension associated with diabetes ([place]) 09/11/2014  . Hyperlipidemia associated with type 2 diabetes mellitus ([place]) 09/11/2014  . Chronic foot pain 09/11/2014   Past Medical History:  Diagnosis Date  . Arthritis   . Chronic pain   . Essential hypertension   . GERD (gastroesophageal reflux disease)   . Hyperlipidemia   . Type 2 diabetes mellitus (HCC)     Family History  Problem Relation Age of Onset  . Diabetes Mother   . Hypertension Mother     . Hyperlipidemia Mother   . Arthritis Mother   . Diabetes Father   . Heart disease Father        Age 54  . Hypertension Sister     Past Surgical History:  Procedure Laterality Date  . ESOPHAGOGASTRODUODENOSCOPY (EGD) WITH PROPOFOL N/A 04/04/2015   Procedure: ESOPHAGOGASTRODUODENOSCOPY (EGD) WITH PROPOFOL;  Surgeon: Rogene Houston, MD;  Location: [facility];  Service: Endoscopy;  Laterality: N/A;  8:30  . FRACTURE SURGERY  March 2007   Left arm, left thumb, bilateral legs  . WRIST FRACTURE SURGERY     Social History   Occupational History  . Not on file  Tobacco Use  . Smoking status: Current Every Day Smoker    Packs/day: 0.75    Years: 20.00    Pack years: 15.00    Types: Cigarettes  . Smokeless tobacco: Never Used  Substance and Sexual Activity  . Alcohol use: Yes    Alcohol/week: 0.6 oz    Types: 1 Glasses of wine per week    Comment: glass wine a week.   . Drug use: No  . Sexual activity: Yes    Comment: 9 year partner

## 2017-08-01 ENCOUNTER — Encounter: Payer: Self-pay | Admitting: Family Medicine

## 2017-08-01 ENCOUNTER — Ambulatory Visit (INDEPENDENT_AMBULATORY_CARE_PROVIDER_SITE_OTHER): Payer: Medicare HMO | Admitting: Family Medicine

## 2017-08-01 VITALS — BP 106/71 | HR 77 | Temp 97.7°F | Ht 73.0 in | Wt 258.8 lb

## 2017-08-01 DIAGNOSIS — L89893 Pressure ulcer of other site, stage 3: Secondary | ICD-10-CM

## 2017-08-01 MED ORDER — CEPHALEXIN 500 MG PO CAPS: 500.0000 mg | ORAL_CAPSULE | Freq: Three times a day (TID) | ORAL | 0 refills | Status: DC

## 2017-08-01 NOTE — Progress Notes (Signed)
BP 106/71   Pulse 77   Temp 97.7 F (36.5 C) (Oral)   Ht 6\' 1"  (1.854 m)   Wt 258 lb 12.8 oz (117.4 kg)   BMI 34.14 kg/m    Subjective:    Patient ID: Timothy Bouche., male    DOB: [birth year], 52 y.o.   MRN: [id number]  HPI: Timothy Lingenfelter. is a 52 y.o. male presenting on 08/01/2017 for check wound on right foot (discus referral )   HPI Right foot wound Patient is coming in today for right foot wound and second opinion and wants wound care referral.  He has been seeing podiatry because of his abnormal shaped ankles and feet due to trauma in the past.  He typically develops a callus on the sole of his foot just proximal to his fifth digit on both feet.  He had the callus removed by podiatry and he says the wound is only become more deep since he had it removed.  He denies any fevers or chills currently and he says the pain is gone from the callus but he is concerned because it does have a very significant odor now.  The ulcer is on the sole of his right foot just proximal to the right fifth digit where he usually has his callus on the foot.  Relevant past medical, surgical, family and social history reviewed and updated as indicated. Interim medical history since our last visit reviewed. Allergies and medications reviewed and updated.  Review of Systems  Constitutional: Negative for chills and fever.  Respiratory: Negative for shortness of breath and wheezing.   Cardiovascular: Negative for chest pain and leg swelling.  Musculoskeletal: Negative for back pain and gait problem.  Skin: Positive for color change and wound. Negative for rash.  All other systems reviewed and are negative.   Per HPI unless specifically indicated above   Allergies as of 08/01/2017      Reactions   Fenofibrate Rash   Permament rash.      Medication List        Accurate as of 08/01/17  4:33 PM. Always use your most recent med list.          accu-chek softclix lancets Use as instructed     CINNAMON PO Take 1 capsule by mouth 2 (two) times daily.   empagliflozin 25 MG Tabs tablet Commonly known as:  JARDIANCE Take 25 mg by mouth daily.   fluticasone 50 MCG/ACT nasal spray Commonly known as:  FLONASE Place 1 spray into both nostrils 2 (two) times daily as needed for allergies or rhinitis.   glipiZIDE 10 MG 24 hr tablet Commonly known as:  GLUCOTROL XL Take 1 tablet (10 mg total) by mouth daily with breakfast.   glucose blood test strip Commonly known as:  ACCU-CHEK AVIVA Use as instructed   ketoconazole 2 % cream Commonly known as:  NIZORAL Apply 1 application topically daily.   lisinopril 20 MG tablet Commonly known as:  PRINIVIL,ZESTRIL Take 1 tablet (20 mg total) by mouth daily.   metFORMIN 1000 MG tablet Commonly known as:  GLUCOPHAGE Take 1 tablet (1,000 mg total) by mouth 2 (two) times daily with a meal.   metoCLOPramide 10 MG tablet Commonly known as:  REGLAN TAKE 1 TABLET TWICE DAILY   mupirocin ointment 2 % Commonly known as:  BACTROBAN Apply 1 application topically 2 (two) times daily.   Oxycodone HCl 10 MG Tabs Take 1 tablet (10 mg total) by mouth 4 (four) times  daily.   Oxycodone HCl 10 MG Tabs Take 1 tablet (10 mg total) by mouth 4 (four) times daily as needed. Do not refill until 30 days from prescription date   simvastatin 40 MG tablet Commonly known as:  ZOCOR Take 1 tablet (40 mg total) by mouth daily at 6 PM.          Objective:    BP 106/71   Pulse 77   Temp 97.7 F (36.5 C) (Oral)   Ht 6\' 1"  (1.854 m)   Wt 258 lb 12.8 oz (117.4 kg)   BMI 34.14 kg/m   Wt Readings from Last 3 Encounters:  08/01/17 258 lb 12.8 oz (117.4 kg)  07/27/17 255 lb (115.7 kg)  06/15/17 260 lb (117.9 kg)    Physical Exam  Constitutional: He is oriented to person, place, and time. He appears well-developed and well-nourished. No distress.  Eyes: Conjunctivae are normal. No scleral icterus.  Neurological: He is alert and oriented to person,  place, and time. Coordination normal.  Skin: Skin is warm and dry. Lesion (Patient has a 2 x 2 centimeter ulceration that is 0.5 cm deep in the center with some yellow thin drainage out of it currently.  He denies any tenderness to palpation.  There is some erythema surrounding the ulceration.) noted. No rash noted. He is not diaphoretic.  Psychiatric: He has a normal mood and affect. His behavior is normal.  Nursing note and vitals reviewed.       Assessment & Plan:   Problem List Items Addressed This Visit    None    Visit Diagnoses    Pressure injury of right foot, stage 3 ([place])    -  Primary   Proximal to right fifth toe on the pad of the foot   Relevant Medications   cephALEXin (KEFLEX) 500 MG capsule   Other Relevant Orders   AMB referral to wound care center   Anaerobic and Aerobic Culture       Follow up plan: Return in about 1 week (around 08/08/2017), or if symptoms worsen or fail to improve, for Right foot ulcer, if he cannot get in wound care.  Counseling provided for all of the vaccine components No orders of the defined types were placed in this encounter.   Caryl Pina, MD [facility] 08/01/2017, 4:33 PM

## 2017-08-02 ENCOUNTER — Encounter: Payer: Self-pay | Admitting: Nurse Practitioner

## 2017-08-05 DIAGNOSIS — E11621 Type 2 diabetes mellitus with foot ulcer: Secondary | ICD-10-CM | POA: Diagnosis not present

## 2017-08-05 DIAGNOSIS — L97416 Non-pressure chronic ulcer of right heel and midfoot with bone involvement without evidence of necrosis: Secondary | ICD-10-CM | POA: Diagnosis not present

## 2017-08-05 DIAGNOSIS — L97509 Non-pressure chronic ulcer of other part of unspecified foot with unspecified severity: Secondary | ICD-10-CM | POA: Diagnosis not present

## 2017-08-05 DIAGNOSIS — G629 Polyneuropathy, unspecified: Secondary | ICD-10-CM | POA: Insufficient documentation

## 2017-08-05 DIAGNOSIS — E119 Type 2 diabetes mellitus without complications: Secondary | ICD-10-CM | POA: Insufficient documentation

## 2017-08-05 LAB — ANAEROBIC AND AEROBIC CULTURE

## 2017-08-08 DIAGNOSIS — L97412 Non-pressure chronic ulcer of right heel and midfoot with fat layer exposed: Secondary | ICD-10-CM | POA: Diagnosis not present

## 2017-08-08 DIAGNOSIS — E11621 Type 2 diabetes mellitus with foot ulcer: Secondary | ICD-10-CM | POA: Diagnosis not present

## 2017-08-08 DIAGNOSIS — E1142 Type 2 diabetes mellitus with diabetic polyneuropathy: Secondary | ICD-10-CM | POA: Diagnosis not present

## 2017-08-18 ENCOUNTER — Ambulatory Visit: Payer: Self-pay | Admitting: Nurse Practitioner

## 2017-08-18 DIAGNOSIS — M216X1 Other acquired deformities of right foot: Secondary | ICD-10-CM | POA: Diagnosis not present

## 2017-08-18 DIAGNOSIS — E11621 Type 2 diabetes mellitus with foot ulcer: Secondary | ICD-10-CM | POA: Diagnosis not present

## 2017-08-18 DIAGNOSIS — L97412 Non-pressure chronic ulcer of right heel and midfoot with fat layer exposed: Secondary | ICD-10-CM | POA: Diagnosis not present

## 2017-08-25 ENCOUNTER — Ambulatory Visit (INDEPENDENT_AMBULATORY_CARE_PROVIDER_SITE_OTHER): Payer: Medicare HMO | Admitting: Orthopedic Surgery

## 2017-08-26 DIAGNOSIS — R69 Illness, unspecified: Secondary | ICD-10-CM | POA: Diagnosis not present

## 2017-09-01 DIAGNOSIS — E11621 Type 2 diabetes mellitus with foot ulcer: Secondary | ICD-10-CM | POA: Diagnosis not present

## 2017-09-01 DIAGNOSIS — L97412 Non-pressure chronic ulcer of right heel and midfoot with fat layer exposed: Secondary | ICD-10-CM | POA: Diagnosis not present

## 2017-09-12 ENCOUNTER — Encounter: Payer: Self-pay | Admitting: Family Medicine

## 2017-09-12 ENCOUNTER — Ambulatory Visit (INDEPENDENT_AMBULATORY_CARE_PROVIDER_SITE_OTHER): Payer: Medicare HMO | Admitting: Family Medicine

## 2017-09-12 VITALS — BP 138/80 | HR 83 | Temp 97.5°F | Ht 73.0 in | Wt 274.4 lb

## 2017-09-12 DIAGNOSIS — E1159 Type 2 diabetes mellitus with other circulatory complications: Secondary | ICD-10-CM

## 2017-09-12 DIAGNOSIS — E661 Drug-induced obesity: Secondary | ICD-10-CM | POA: Diagnosis not present

## 2017-09-12 DIAGNOSIS — I10 Essential (primary) hypertension: Secondary | ICD-10-CM

## 2017-09-12 DIAGNOSIS — E1165 Type 2 diabetes mellitus with hyperglycemia: Secondary | ICD-10-CM

## 2017-09-12 DIAGNOSIS — M79671 Pain in right foot: Secondary | ICD-10-CM

## 2017-09-12 DIAGNOSIS — G8929 Other chronic pain: Secondary | ICD-10-CM

## 2017-09-12 DIAGNOSIS — E1169 Type 2 diabetes mellitus with other specified complication: Secondary | ICD-10-CM | POA: Diagnosis not present

## 2017-09-12 DIAGNOSIS — E785 Hyperlipidemia, unspecified: Secondary | ICD-10-CM | POA: Diagnosis not present

## 2017-09-12 DIAGNOSIS — S0340XA Sprain of jaw, unspecified side, initial encounter: Secondary | ICD-10-CM | POA: Diagnosis not present

## 2017-09-12 DIAGNOSIS — Z6835 Body mass index (BMI) 35.0-35.9, adult: Secondary | ICD-10-CM

## 2017-09-12 LAB — BAYER DCA HB A1C WAIVED: HB A1C (BAYER DCA - WAIVED): 7.2 % — ABNORMAL HIGH (ref <7.0)

## 2017-09-12 MED ORDER — OXYCODONE HCL 10 MG PO TABS: 10.0000 mg | ORAL_TABLET | Freq: Four times a day (QID) | ORAL | 0 refills | Status: DC

## 2017-09-12 MED ORDER — OXYCODONE HCL 10 MG PO TABS: 10.0000 mg | ORAL_TABLET | Freq: Four times a day (QID) | ORAL | 0 refills | Status: DC | PRN

## 2017-09-12 NOTE — Progress Notes (Signed)
BP 138/80   Pulse 83   Temp (!) 97.5 F (36.4 C) (Oral)   Ht 6' 1"  (1.854 m)   Wt 274 lb 6.4 oz (124.5 kg)   BMI 36.20 kg/m    Subjective:    Patient ID: Timothy Nelson., male    DOB: 05-30-1965, 52 y.o.   MRN: [id number]  HPI: Timothy Nelson. is a 52 y.o. male presenting on 09/12/2017 for Diabetes (3 month follow up) and Hypertension   HPI Hypertension Patient is currently on lisinopril, and their blood pressure today is 138/80. Patient denies any lightheadedness or dizziness. Patient denies headaches, blurred vision, chest pains, shortness of breath, or weakness. Denies any side effects from medication and is content with current medication.   Type 2 diabetes mellitus Patient comes in today for recheck of his diabetes. Patient has been currently taking glipizide and Jardiance and metformin. Patient is currently on an ACE inhibitor/ARB. Patient has seen an ophthalmologist this year.  Patient has an ulcer on the base of his right foot that he is seen by both wound care and podiatry and they are working on a trip writing he is following up with them regularly and on antibiotics currently.  Patient has decreased sensation in his right foot from previous trauma that has been chronic and unchanged.  Hyperlipidemia Patient is coming in for recheck of his hyperlipidemia. The patient is currently taking simvastatin. They deny any issues with myalgias or history of liver damage from it. They deny any focal numbness or weakness or chest pain.   Left jaw pain Left face/jaw pain after dental procedure this been going on for about a week.  He said he had dental extraction on the other side and clenched down to where he cannot open his mouth completely now.  The dentist that he was seen recommended a muscle relaxer and physical therapy and he would like a referral for physical therapy today.  He can only open his mouth about two thirds of what he normally can.  Pain and tightness is on the left side  of his face/jaw.  Chronic foot pain/pain medication refill  Relevant past medical, surgical, family and social history reviewed and updated as indicated. Interim medical history since our last visit reviewed. Allergies and medications reviewed and updated.  Review of Systems  Constitutional: Negative for chills and fever.  Eyes: Negative for visual disturbance.  Respiratory: Negative for shortness of breath and wheezing.   Cardiovascular: Negative for chest pain and leg swelling.  Musculoskeletal: Negative for back pain and gait problem.  Skin: Positive for wound. Negative for rash.  Neurological: Negative for dizziness, weakness and headaches.  All other systems reviewed and are negative.   Per HPI unless specifically indicated above   Allergies as of 09/12/2017      Reactions   Fenofibrate Rash   Permament rash.      Medication List        Accurate as of 09/12/17  1:29 PM. Always use your most recent med list.          accu-chek softclix lancets Use as instructed   amoxicillin 500 MG capsule Commonly known as:  AMOXIL Take 500 mg by mouth 3 (three) times daily.   CINNAMON PO Take 1 capsule by mouth 2 (two) times daily.   empagliflozin 25 MG Tabs tablet Commonly known as:  JARDIANCE Take 25 mg by mouth daily.   fluticasone 50 MCG/ACT nasal spray Commonly known as:  FLONASE Place 1 spray  into both nostrils 2 (two) times daily as needed for allergies or rhinitis.   glipiZIDE 10 MG 24 hr tablet Commonly known as:  GLUCOTROL XL Take 1 tablet (10 mg total) by mouth daily with breakfast.   glucose blood test strip Use as instructed   ketoconazole 2 % cream Commonly known as:  NIZORAL Apply 1 application topically daily.   lisinopril 20 MG tablet Commonly known as:  PRINIVIL,ZESTRIL Take 1 tablet (20 mg total) by mouth daily.   metFORMIN 1000 MG tablet Commonly known as:  GLUCOPHAGE Take 1 tablet (1,000 mg total) by mouth 2 (two) times daily with a meal.     metoCLOPramide 10 MG tablet Commonly known as:  REGLAN TAKE 1 TABLET TWICE DAILY   mupirocin ointment 2 % Commonly known as:  BACTROBAN Apply 1 application topically 2 (two) times daily.   Oxycodone HCl 10 MG Tabs Take 1 tablet (10 mg total) by mouth 4 (four) times daily as needed. Do not refill until 30 days from prescription date   Oxycodone HCl 10 MG Tabs Take 1 tablet (10 mg total) by mouth 4 (four) times daily.   Oxycodone HCl 10 MG Tabs Take 1 tablet (10 mg total) by mouth 4 (four) times daily as needed. Do not refill until 60 days from prescription date   simvastatin 40 MG tablet Commonly known as:  ZOCOR Take 1 tablet (40 mg total) by mouth daily at 6 PM.          Objective:    BP 138/80   Pulse 83   Temp (!) 97.5 F (36.4 C) (Oral)   Ht 6' 1"  (1.854 m)   Wt 274 lb 6.4 oz (124.5 kg)   BMI 36.20 kg/m   Wt Readings from Last 3 Encounters:  09/12/17 274 lb 6.4 oz (124.5 kg)  08/01/17 258 lb 12.8 oz (117.4 kg)  07/27/17 255 lb (115.7 kg)    Physical Exam  Constitutional: He is oriented to person, place, and time. He appears well-developed and well-nourished. No distress.  Eyes: Conjunctivae are normal. No scleral icterus.  Neck: Neck supple. No thyromegaly present.  Cardiovascular: Normal rate, regular rhythm, normal heart sounds and intact distal pulses.  No murmur heard. Pulmonary/Chest: Effort normal and breath sounds normal. No respiratory distress. He has no wheezes.  Musculoskeletal: Normal range of motion. He exhibits no edema.  Lymphadenopathy:    He has no cervical adenopathy.  Neurological: He is alert and oriented to person, place, and time. Coordination normal.  Skin: Skin is warm and dry. Lesion (1.5 cm ulceration on the plantar surface of his right foot on the lateral aspect just proximal to the fifth phalanx) noted. No rash noted. He is not diaphoretic.  Psychiatric: He has a normal mood and affect. His behavior is normal.  Nursing note and  vitals reviewed.       Assessment & Plan:   Problem List Items Addressed This Visit      Cardiovascular and Mediastinum   Hypertension associated with diabetes ([place])   Relevant Orders   CMP14+EGFR     Endocrine   Diabetes type 2, uncontrolled ([place])   Relevant Orders   Bayer DCA Hb A1c Waived   Hyperlipidemia associated with type 2 diabetes mellitus ([place]) - Primary   Relevant Orders   Lipid panel     Other   Chronic foot pain   Relevant Medications   Oxycodone HCl 10 MG TABS   Oxycodone HCl 10 MG TABS   Oxycodone HCl  10 MG TABS   Class 2 drug-induced obesity with serious comorbidity and body mass index (BMI) of 35.0 to 35.9 in adult   Relevant Orders   Bayer DCA Hb A1c Waived    Other Visit Diagnoses    Jaw sprain, initial encounter       Patient had a dental extraction and has had trouble with his left jaw, dentist recommended PT   Relevant Orders   Ambulatory referral to Physical Therapy       Follow up plan: Return in about 3 months (around 12/12/2017), or if symptoms worsen or fail to improve, for Diabetes hypertension and chronic pain.  Counseling provided for all of the vaccine components Orders Placed This Encounter  Procedures  . Bayer DCA Hb A1c Waived  . CMP14+EGFR  . Lipid panel  . Ambulatory referral to Physical Therapy    Caryl Pina, MD [facility] 09/12/2017, 1:29 PM

## 2017-09-13 LAB — LIPID PANEL
Chol/HDL Ratio: 4.4 ratio (ref 0.0–5.0)
Cholesterol, Total: 155 mg/dL (ref 100–199)
HDL: 35 mg/dL — ABNORMAL LOW (ref >39)
LDL Calculated: 41 mg/dL (ref 0–99)
Triglycerides: 393 mg/dL — ABNORMAL HIGH (ref 0–149)
VLDL Cholesterol Cal: 79 mg/dL — ABNORMAL HIGH (ref 5–40)

## 2017-09-13 LAB — CMP14+EGFR
ALT: 31 IU/L (ref 0–44)
AST: 22 IU/L (ref 0–40)
Albumin/Globulin Ratio: 1.6 (ref 1.2–2.2)
Albumin: 4.2 g/dL (ref 3.5–5.5)
Alkaline Phosphatase: 97 IU/L (ref 39–117)
BUN/Creatinine Ratio: 14 (ref 9–20)
BUN: 13 mg/dL (ref 6–24)
Bilirubin Total: 0.2 mg/dL (ref 0.0–1.2)
CO2: 22 mmol/L (ref 20–29)
Calcium: 9.6 mg/dL (ref 8.7–10.2)
Chloride: 102 mmol/L (ref 96–106)
Creatinine, Ser: 0.91 mg/dL (ref 0.76–1.27)
GFR calc Af Amer: 112 mL/min/{1.73_m2} (ref >59)
GFR calc non Af Amer: 97 mL/min/{1.73_m2} (ref >59)
Globulin, Total: 2.6 g/dL (ref 1.5–4.5)
Glucose: 222 mg/dL — ABNORMAL HIGH (ref 65–99)
Potassium: 4 mmol/L (ref 3.5–5.2)
Sodium: 140 mmol/L (ref 134–144)
Total Protein: 6.8 g/dL (ref 6.0–8.5)

## 2017-09-15 DIAGNOSIS — L97412 Non-pressure chronic ulcer of right heel and midfoot with fat layer exposed: Secondary | ICD-10-CM | POA: Diagnosis not present

## 2017-09-15 DIAGNOSIS — E11621 Type 2 diabetes mellitus with foot ulcer: Secondary | ICD-10-CM | POA: Diagnosis not present

## 2017-09-21 ENCOUNTER — Encounter: Payer: Self-pay | Admitting: Physical Therapy

## 2017-09-21 ENCOUNTER — Ambulatory Visit: Payer: Medicare HMO | Attending: Family Medicine | Admitting: Physical Therapy

## 2017-09-21 ENCOUNTER — Other Ambulatory Visit: Payer: Self-pay

## 2017-09-21 DIAGNOSIS — R278 Other lack of coordination: Secondary | ICD-10-CM | POA: Diagnosis not present

## 2017-09-21 NOTE — Therapy (Deleted)
[facility] [place], [place], [address] Phone: [phone]   Fax:  [phone]  Physical Therapy Treatment  Patient Details  Name: Timothy Nelson. MRN: [id number] Date of Birth: 1965-12-31 Referring Provider: Caryl Pina, MD   Encounter Date: 09/21/2017  PT End of Session - 09/21/17 1409    Visit Number  1    Number of Visits  4    Date for PT Re-Evaluation  10/26/17    PT Start Time  0945    PT Stop Time  1015    PT Time Calculation (min)  30 min    Activity Tolerance  Patient tolerated treatment well    Behavior During Therapy  [facility] for tasks assessed/performed       Past Medical History:  Diagnosis Date  . Arthritis   . Chronic pain   . Essential hypertension   . GERD (gastroesophageal reflux disease)   . Hyperlipidemia   . Type 2 diabetes mellitus ([place])     Past Surgical History:  Procedure Laterality Date  . ESOPHAGOGASTRODUODENOSCOPY (EGD) WITH PROPOFOL N/A 04/04/2015   Procedure: ESOPHAGOGASTRODUODENOSCOPY (EGD) WITH PROPOFOL;  Surgeon: Rogene Houston, MD;  Location: [facility];  Service: Endoscopy;  Laterality: N/A;  8:30  . FRACTURE SURGERY  March 2007   Left arm, left thumb, bilateral legs  . WRIST FRACTURE SURGERY      There were no vitals filed for this visit.  Subjective Assessment - 09/21/17 1427    Subjective  Patient arrives to physical therapy with reports of left jaw pain with end range mouth opening secondary to a tooth extraction in August 2019. Patient states after the tooth extraction, he was unable to open his mouth to eat a piece of toast. He returned to the dentist who stated he should open his mouth, apply pressure and hold for two minutes. He stated he has not been performing HEP provided by MD and was seen by family doctor who referred him to PT. Over the past couple weeks mouth opening has improved but has not returned to normal. Patient states pain at worst is 6/10 with  pressure and pain at best is 0/10. Patient noted with minimal tenderness to palpation along masseter muscle and just inferior to posterior aspect of zygomatic arch. Patient's goals are to improve jaw movement.    Pertinent History  HTN, DM    Limitations  Other (comment)    Patient Stated Goals  improve movement    Currently in Pain?  No/denies         [facility] PT Assessment - 09/21/17 0001      Assessment   Medical Diagnosis  Jaw sprain    Referring Provider  Caryl Pina, MD    Onset Date/Surgical Date  08/25/17    Next MD Visit  December, 2019    Prior Therapy  no      Precautions   Precautions  None      Restrictions   Weight Bearing Restrictions  No      Balance Screen   Has the patient fallen in the past 6 months  No    Has the patient had a decrease in activity level because of a fear of falling?   No    Is the patient reluctant to leave their home because of a fear of falling?   No      Home Film/video editor residence      Prior Function  Level of Independence  Independent      ROM / Strength   AROM / PROM / Strength  AROM;PROM      AROM   Overall AROM Comments  slight lateral deviation to left upon opening; cervical AROM WFL    AROM Assessment Site  Jaw    Jaw-Incisal Opening   2.8 cm   PROM 3.8 cm   Jaw-Left Lateral Excurison  1.8    Jaw-Right Lateral Excursion  1.5      Palpation   Palpation comment  very minimal tenderness to palpation along masseter,                            PT Education - 09/21/17 1430    Education Details  AAROM jaw opening 30" hold multiple times per day.    Person(s) Educated  Patient;Spouse    Methods  Explanation;Demonstration    Comprehension  Verbalized understanding          PT Long Term Goals - 09/21/17 1433      PT LONG TERM GOAL #1   Title  Patient will be independent with HEP    Time  4    Period  Weeks    Status  New      PT LONG TERM GOAL #2   Title   Patient will demonstrate 40 mm (4 cm) of AROM mouth opening to improve ability to eat    Time  4    Period  Weeks    Status  New      PT LONG TERM GOAL #3   Title  Patient will report no jaw pain with chewing and eating    Time  4    Period  Weeks    Status  New            Plan - 09/21/17 1431    Clinical Impression Statement  Patient is a 52 year old male who presents to physical therapy with decreased jaw opening with left sided jaw pain at end range. Patient noted with strong and painless MMT in all planes. Patient noted with minimal tenderness to palpation. No increase muscle tone or firmness in left masseter or temporalis in comparison to the right. Patient would benefit from skilled physical therapy to address ROM deficits and address patient's goals.     History and Personal Factors relevant to plan of care:  Tooth extraction August 25, 2017    Clinical Presentation  Stable    Clinical Decision Making  Low    Rehab Potential  Excellent    PT Frequency  1x / week    PT Duration  4 weeks    PT Treatment/Interventions  Cryotherapy;Moist Heat;ADLs/Self Care Home Management;Patient/family education;Therapeutic exercise;Passive range of motion;Manual techniques;Dry needling    PT Next Visit Plan  PROM of jaw to improve jaw opening, STW/M to masseter and surrounding muscles    PT Home Exercise Plan  see patient education section    Consulted and Agree with Plan of Care  Patient       Patient will benefit from skilled therapeutic intervention in order to improve the following deficits and impairments:  Pain, Decreased range of motion  Visit Diagnosis: Other lack of coordination     Problem List Patient Active Problem List   Diagnosis Date Noted  . Poor sleep hygiene 02/04/2017  . 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 Tobacco use disorder 01/19/2017  . Obsessive compulsive  disorder 01/19/2017  . Class 2 drug-induced obesity with serious comorbidity  and body mass index (BMI) of 35.0 to 35.9 in adult 01/19/2017  . Snoring 01/19/2017  . Excessive daytime sleepiness 01/19/2017  . GERD (gastroesophageal reflux disease) 01/09/2015  . Diabetes type 2, uncontrolled ([place]) 09/11/2014  . Hypertension associated with diabetes ([place]) 09/11/2014  . Hyperlipidemia associated with type 2 diabetes mellitus ([place]) 09/11/2014  . Chronic foot pain 09/11/2014    Gabriela Eves 09/21/2017, 2:51 PM  [facility] [address] [place], [place], [address] Phone: [phone]   Fax:  [phone]  Name: Timothy Nelson. MRN: [id number] Date of Birth: 09-Mar-1965

## 2017-09-21 NOTE — Therapy (Signed)
[facility] [place], [place], [address] Phone: [phone]   Fax:  [phone]  Physical Therapy Evaluation  Patient Details  Name: Timothy Nelson. MRN: [id number] Date of Birth: 1965/09/26 Referring Provider: Caryl Pina, MD   Encounter Date: 09/21/2017  PT End of Session - 09/21/17 1409    Visit Number  1    Number of Visits  4    Date for PT Re-Evaluation  10/26/17    PT Start Time  0945    PT Stop Time  1015    PT Time Calculation (min)  30 min    Activity Tolerance  Patient tolerated treatment well    Behavior During Therapy  [facility] for tasks assessed/performed       Past Medical History:  Diagnosis Date  . Arthritis   . Chronic pain   . Essential hypertension   . GERD (gastroesophageal reflux disease)   . Hyperlipidemia   . Type 2 diabetes mellitus ([place])     Past Surgical History:  Procedure Laterality Date  . ESOPHAGOGASTRODUODENOSCOPY (EGD) WITH PROPOFOL N/A 04/04/2015   Procedure: ESOPHAGOGASTRODUODENOSCOPY (EGD) WITH PROPOFOL;  Surgeon: Rogene Houston, MD;  [facility];  Service: Endoscopy;  Laterality: N/A;  8:30  . FRACTURE SURGERY  March 2007   Left arm, left thumb, bilateral legs  . WRIST FRACTURE SURGERY      There were no vitals filed for this visit.   Subjective Assessment - 09/21/17 1427    Subjective  Patient arrives to physical therapy with reports of left jaw pain with end range mouth opening secondary to a tooth extraction in August 2019. Patient states after the tooth extraction, he was unable to open his mouth to eat a piece of toast. He returned to the dentist who stated he should open his mouth, apply pressure and hold for two minutes. He stated he has not been performing HEP provided by MD and was seen by family doctor who referred him to PT. Over the past couple weeks mouth opening has improved but has not returned to normal. Patient states pain at worst is 6/10 with  pressure and pain at best is 0/10. Patient noted with minimal tenderness to palpation along masseter muscle and just inferior to posterior aspect of zygomatic arch. Patient's goals are to improve jaw movement.    Pertinent History  HTN, DM    Limitations  Other (comment)    Patient Stated Goals  improve movement    Currently in Pain?  No/denies         [facility] PT Assessment - 09/21/17 0001      Assessment   Medical Diagnosis  Jaw sprain    Referring Provider  Caryl Pina, MD    Onset Date/Surgical Date  08/25/17    Next MD Visit  December, 2019    Prior Therapy  no      Precautions   Precautions  None      Restrictions   Weight Bearing Restrictions  No      Balance Screen   Has the patient fallen in the past 6 months  No    Has the patient had a decrease in activity level because of a fear of falling?   No    Is the patient reluctant to leave their home because of a fear of falling?   No      Home Film/video editor residence      Prior Function  Level of Independence  Independent      ROM / Strength   AROM / PROM / Strength  AROM;PROM      AROM   Overall AROM Comments  slight lateral deviation to left upon opening; cervical AROM WFL    AROM Assessment Site  Jaw    Jaw-Incisal Opening   2.8 cm   PROM 3.8 cm   Jaw-Left Lateral Excurison  1.8 cm    Jaw-Right Lateral Excursion  1.5 cm      Palpation   Palpation comment  very minimal tenderness to palpation along masseter                Objective measurements completed on examination: See above findings.              PT Education - 09/21/17 1430    Education Details  AAROM jaw opening 30" hold multiple times per day.    Person(s) Educated  Patient;Spouse    Methods  Explanation;Demonstration    Comprehension  Verbalized understanding          PT Long Term Goals - 09/21/17 1433      PT LONG TERM GOAL #1   Title  Patient will be independent with HEP    Time   4    Period  Weeks    Status  New      PT LONG TERM GOAL #2   Title  Patient will demonstrate 40 mm (4 cm) of AROM mouth opening to improve ability to eat    Time  4    Period  Weeks    Status  New      PT LONG TERM GOAL #3   Title  Patient will report no jaw pain with chewing and eating    Time  4    Period  Weeks    Status  New             Plan - 09/21/17 1431    Clinical Impression Statement  Patient is a 52 year old male who presents to physical therapy with decreased jaw opening with left sided jaw pain at end range. Patient noted with strong and painless MMT in all planes. Patient noted with minimal tenderness to palpation. No increase muscle tone or firmness in left masseter or temporalis in comparison to the right. Patient would benefit from skilled physical therapy to address ROM deficits and address patient's goals.     History and Personal Factors relevant to plan of care:  Tooth extraction August 25, 2017    Clinical Presentation  Stable    Clinical Decision Making  Low    Rehab Potential  Excellent    PT Frequency  1x / week    PT Duration  4 weeks    PT Treatment/Interventions  Cryotherapy;Moist Heat;ADLs/Self Care Home Management;Patient/family education;Therapeutic exercise;Passive range of motion;Manual techniques;Dry needling    PT Next Visit Plan  PROM of jaw to improve jaw opening, STW/M to masseter and surrounding muscles    PT Home Exercise Plan  see patient education section    Consulted and Agree with Plan of Care  Patient       Patient will benefit from skilled therapeutic intervention in order to improve the following deficits and impairments:  Pain, Decreased range of motion  Visit Diagnosis: Other lack of coordination     Problem List Patient Active Problem List   Diagnosis Date Noted  . Poor sleep hygiene 02/04/2017  . 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 Tobacco use disorder 01/19/2017  . Obsessive compulsive  disorder 01/19/2017  . Class 2 drug-induced obesity with serious comorbidity and body mass index (BMI) of 35.0 to 35.9 in adult 01/19/2017  . Snoring 01/19/2017  . Excessive daytime sleepiness 01/19/2017  . GERD (gastroesophageal reflux disease) 01/09/2015  . Diabetes type 2, uncontrolled ([place]) 09/11/2014  . Hypertension associated with diabetes ([place]) 09/11/2014  . Hyperlipidemia associated with type 2 diabetes mellitus ([place]) 09/11/2014  . Chronic foot pain 09/11/2014    Gabriela Eves, PT, DPT 09/21/2017, 2:54 PM  [facility] [address] [place], [place], [address] Phone: [phone]   Fax:  [phone]  Name: Timothy Nelson. MRN: [id number] Date of Birth: November 26, 1965

## 2017-09-27 ENCOUNTER — Ambulatory Visit: Payer: Medicare HMO | Admitting: Physical Therapy

## 2017-09-27 ENCOUNTER — Encounter: Payer: Self-pay | Admitting: Physical Therapy

## 2017-09-27 DIAGNOSIS — R278 Other lack of coordination: Secondary | ICD-10-CM | POA: Diagnosis not present

## 2017-09-27 NOTE — Therapy (Addendum)
[facility] [place], [place], [address] Phone: [phone]   Fax:  [phone]  Physical Therapy Treatment/Discharge  PHYSICAL THERAPY DISCHARGE SUMMARY  Visits from Start of Care:   Current functional level related to goals / functional outcomes: See below   Remaining deficits: See goals   Education / Equipment: HEP Plan: Patient agrees to discharge.  Patient goals were partially met. Patient is being discharged due to not returning since the last visit.  ?????      Patient Details  Name: Timothy Nelson. MRN: [id number] Date of Birth: 08/29/1965 Referring Provider: Caryl Pina, MD   Encounter Date: 09/27/2017  PT End of Session - 09/27/17 0736    Visit Number  2    Number of Visits  4    Date for PT Re-Evaluation  10/26/17    PT Start Time  0730    PT Stop Time  0810   STW/M for jaw only   PT Time Calculation (min)  40 min    Activity Tolerance  Patient tolerated treatment well    Behavior During Therapy  [facility] for tasks assessed/performed       Past Medical History:  Diagnosis Date  . Arthritis   . Chronic pain   . Essential hypertension   . GERD (gastroesophageal reflux disease)   . Hyperlipidemia   . Type 2 diabetes mellitus ([place])     Past Surgical History:  Procedure Laterality Date  . ESOPHAGOGASTRODUODENOSCOPY (EGD) WITH PROPOFOL N/A 04/04/2015   Procedure: ESOPHAGOGASTRODUODENOSCOPY (EGD) WITH PROPOFOL;  Surgeon: Rogene Houston, MD;  [facility];  Service: Endoscopy;  Laterality: N/A;  8:30  . FRACTURE SURGERY  March 2007   Left arm, left thumb, bilateral legs  . WRIST FRACTURE SURGERY      There were no vitals filed for this visit.  Subjective Assessment - 09/27/17 0736    Subjective  Patient reports feeling a little more sore since IE. Reports he can eat an apple but reports soreness at left jaw.     Pertinent History  HTN, DM    Limitations  Other (comment)    Patient Stated Goals  improve movement    Currently in Pain?  No/denies         [facility] PT Assessment - 09/27/17 0001      Assessment   Medical Diagnosis  Jaw sprain                   [facility] Adult PT Treatment/Exercise - 09/27/17 0001      Modalities   Modalities  Moist Heat      Moist Heat Therapy   Number Minutes Moist Heat  10 Minutes    Moist Heat Location  Other (comment)   left jaw; prior to manual therapy     Manual Therapy   Manual Therapy  Passive ROM;Soft tissue mobilization    Soft tissue mobilization  STW/M to left masseter and surrounding muscles to promote muscle relaxation    Passive ROM  PROM to improve jaw opening                  PT Long Term Goals - 09/27/17 0856      PT LONG TERM GOAL #1   Title  Patient will be independent with HEP    Time  4    Period  Weeks    Status  On-going      PT LONG TERM GOAL #2   Title  Patient will demonstrate 40 mm (4 cm) of AROM mouth opening to improve ability to eat    Time  4    Period  Weeks    Status  Achieved   59 mm at end of session     PT LONG TERM GOAL #3   Title  Patient will report no jaw pain with chewing and eating    Status  On-going            Plan - 09/27/17 0737    Clinical Impression Statement  Patient was able to tolerate treatment well. Patient noted with improved jaw opening at end of session to 59 mm.  When asked how HEP was going at home, he reported he has not been perfroming because he does not want to hurt himself. Patient re-educated importance of HEP to optimize therapy and to improve jaw opening. Patient reported understanding.     Clinical Presentation  Stable    Clinical Decision Making  Low    Rehab Potential  Excellent    PT Frequency  1x / week    PT Duration  4 weeks    PT Treatment/Interventions  Cryotherapy;Moist Heat;ADLs/Self Care Home Management;Patient/family education;Therapeutic exercise;Passive range of motion;Manual techniques;Dry needling     PT Next Visit Plan  PROM of jaw to improve jaw opening, STW/M to masseter and surrounding muscles    Consulted and Agree with Plan of Care  Patient       Patient will benefit from skilled therapeutic intervention in order to improve the following deficits and impairments:  Pain, Decreased range of motion  Visit Diagnosis: Other lack of coordination     Problem List Patient Active Problem List   Diagnosis Date Noted  . Poor sleep hygiene 02/04/2017  . 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 Tobacco use disorder 01/19/2017  . Obsessive compulsive disorder 01/19/2017  . Class 2 drug-induced obesity with serious comorbidity and body mass index (BMI) of 35.0 to 35.9 in adult 01/19/2017  . Snoring 01/19/2017  . Excessive daytime sleepiness 01/19/2017  . GERD (gastroesophageal reflux disease) 01/09/2015  . Diabetes type 2, uncontrolled ([place]) 09/11/2014  . Hypertension associated with diabetes ([place]) 09/11/2014  . Hyperlipidemia associated with type 2 diabetes mellitus ([place]) 09/11/2014  . Chronic foot pain 09/11/2014   Gabriela Eves, PT, DPT 09/27/2017, 8:56 AM  [facility] [address] [place], [place], [address] Phone: [phone]   Fax:  [phone]  Name: Timothy Nelson. MRN: [id number] Date of Birth: 05/15/1965

## 2017-09-29 DIAGNOSIS — M216X1 Other acquired deformities of right foot: Secondary | ICD-10-CM | POA: Diagnosis not present

## 2017-09-29 DIAGNOSIS — E11621 Type 2 diabetes mellitus with foot ulcer: Secondary | ICD-10-CM | POA: Diagnosis not present

## 2017-09-29 DIAGNOSIS — L97412 Non-pressure chronic ulcer of right heel and midfoot with fat layer exposed: Secondary | ICD-10-CM | POA: Diagnosis not present

## 2017-09-29 DIAGNOSIS — E1142 Type 2 diabetes mellitus with diabetic polyneuropathy: Secondary | ICD-10-CM | POA: Diagnosis not present

## 2017-09-30 DIAGNOSIS — E11621 Type 2 diabetes mellitus with foot ulcer: Secondary | ICD-10-CM | POA: Insufficient documentation

## 2017-09-30 DIAGNOSIS — Z8631 Personal history of diabetic foot ulcer: Secondary | ICD-10-CM | POA: Insufficient documentation

## 2017-10-07 DIAGNOSIS — Z7984 Long term (current) use of oral hypoglycemic drugs: Secondary | ICD-10-CM | POA: Diagnosis not present

## 2017-10-07 DIAGNOSIS — M19071 Primary osteoarthritis, right ankle and foot: Secondary | ICD-10-CM | POA: Diagnosis not present

## 2017-10-07 DIAGNOSIS — L97519 Non-pressure chronic ulcer of other part of right foot with unspecified severity: Secondary | ICD-10-CM | POA: Diagnosis not present

## 2017-10-07 DIAGNOSIS — I10 Essential (primary) hypertension: Secondary | ICD-10-CM | POA: Diagnosis not present

## 2017-10-07 DIAGNOSIS — L97412 Non-pressure chronic ulcer of right heel and midfoot with fat layer exposed: Secondary | ICD-10-CM | POA: Diagnosis not present

## 2017-10-07 DIAGNOSIS — E11621 Type 2 diabetes mellitus with foot ulcer: Secondary | ICD-10-CM | POA: Diagnosis not present

## 2017-10-07 DIAGNOSIS — E1140 Type 2 diabetes mellitus with diabetic neuropathy, unspecified: Secondary | ICD-10-CM | POA: Diagnosis not present

## 2017-10-07 DIAGNOSIS — E785 Hyperlipidemia, unspecified: Secondary | ICD-10-CM | POA: Diagnosis not present

## 2017-10-07 DIAGNOSIS — Z79899 Other long term (current) drug therapy: Secondary | ICD-10-CM | POA: Diagnosis not present

## 2017-10-07 DIAGNOSIS — G4733 Obstructive sleep apnea (adult) (pediatric): Secondary | ICD-10-CM | POA: Diagnosis not present

## 2017-10-11 DIAGNOSIS — L97412 Non-pressure chronic ulcer of right heel and midfoot with fat layer exposed: Secondary | ICD-10-CM | POA: Diagnosis not present

## 2017-10-11 DIAGNOSIS — E11621 Type 2 diabetes mellitus with foot ulcer: Secondary | ICD-10-CM | POA: Diagnosis not present

## 2017-10-17 ENCOUNTER — Telehealth: Payer: Self-pay

## 2017-10-17 NOTE — Telephone Encounter (Signed)
Needs a peer to peer to get Orthotics   [place]

## 2017-10-18 NOTE — Telephone Encounter (Signed)
They said the peer to peer window has already passed and that the patient will have to do an appeal.

## 2017-10-21 ENCOUNTER — Encounter: Payer: Medicare HMO | Admitting: *Deleted

## 2017-11-02 ENCOUNTER — Other Ambulatory Visit: Payer: Self-pay | Admitting: Family Medicine

## 2017-11-02 DIAGNOSIS — J0100 Acute maxillary sinusitis, unspecified: Secondary | ICD-10-CM

## 2017-11-03 DIAGNOSIS — E109 Type 1 diabetes mellitus without complications: Secondary | ICD-10-CM | POA: Diagnosis not present

## 2017-11-03 DIAGNOSIS — Z0100 Encounter for examination of eyes and vision without abnormal findings: Secondary | ICD-10-CM | POA: Diagnosis not present

## 2017-11-03 DIAGNOSIS — H524 Presbyopia: Secondary | ICD-10-CM | POA: Diagnosis not present

## 2017-11-03 DIAGNOSIS — I10 Essential (primary) hypertension: Secondary | ICD-10-CM | POA: Diagnosis not present

## 2017-11-03 LAB — HM DIABETES EYE EXAM

## 2017-11-22 ENCOUNTER — Encounter: Payer: Self-pay | Admitting: Family Medicine

## 2017-11-23 ENCOUNTER — Encounter: Payer: Self-pay | Admitting: Family Medicine

## 2017-12-05 DIAGNOSIS — E11621 Type 2 diabetes mellitus with foot ulcer: Secondary | ICD-10-CM | POA: Diagnosis not present

## 2017-12-05 DIAGNOSIS — M216X2 Other acquired deformities of left foot: Secondary | ICD-10-CM | POA: Diagnosis not present

## 2017-12-05 DIAGNOSIS — M216X1 Other acquired deformities of right foot: Secondary | ICD-10-CM | POA: Diagnosis not present

## 2017-12-05 DIAGNOSIS — L97412 Non-pressure chronic ulcer of right heel and midfoot with fat layer exposed: Secondary | ICD-10-CM | POA: Diagnosis not present

## 2017-12-06 ENCOUNTER — Encounter: Payer: Self-pay | Admitting: *Deleted

## 2017-12-06 ENCOUNTER — Ambulatory Visit (INDEPENDENT_AMBULATORY_CARE_PROVIDER_SITE_OTHER): Payer: Medicare HMO | Admitting: *Deleted

## 2017-12-06 VITALS — BP 120/78 | HR 78 | Ht 73.0 in | Wt 268.0 lb

## 2017-12-06 DIAGNOSIS — Z0000 Encounter for general adult medical examination without abnormal findings: Secondary | ICD-10-CM | POA: Diagnosis not present
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
## 2017-12-06 NOTE — Patient Instructions (Signed)
Please work on your goal of quitting smoking.  Please review the information given on Advance Directives.  If you complete the paper work please bring a copy to our office to file in your medical record.  Please follow up with Dr. Warrick Parisian as scheduled.  Thank you for coming in for your Annual Wellness Visit today!!  Preventive Care 40-64 Years, Male Preventive care refers to lifestyle choices and visits with your health care provider that can promote health and wellness. What does preventive care include?  A yearly physical exam. This is also called an annual well check.  Dental exams once or twice a year.  Routine eye exams. Ask your health care provider how often you should have your eyes checked.  Personal lifestyle choices, including: ? Daily care of your teeth and gums. ? Regular physical activity. ? Eating a healthy diet. ? Avoiding tobacco and drug use. ? Limiting alcohol use. ? Practicing safe sex. ? Taking low-dose aspirin every day starting at age 31. What happens during an annual well check? The services and screenings done by your health care provider during your annual well check will depend on your age, overall health, lifestyle risk factors, and family history of disease. Counseling Your health care provider may ask you questions about your:  Alcohol use.  Tobacco use.  Drug use.  Emotional well-being.  Home and relationship well-being.  Sexual activity.  Eating habits.  Work and work Statistician.  Screening You may have the following tests or measurements:  Height, weight, and BMI.  Blood pressure.  Lipid and cholesterol levels. These may be checked every 5 years, or more frequently if you are over 8 years old.  Skin check.  Lung cancer screening. You may have this screening every year starting at age 4 if you have a 30-pack-year history of smoking and currently smoke or have quit within the past 15 years.  Fecal occult blood test (FOBT)  of the stool. You may have this test every year starting at age 36.  Flexible sigmoidoscopy or colonoscopy. You may have a sigmoidoscopy every 5 years or a colonoscopy every 10 years starting at age 56.  Prostate cancer screening. Recommendations will vary depending on your family history and other risks.  Hepatitis C blood test.  Hepatitis B blood test.  Sexually transmitted disease (STD) testing.  Diabetes screening. This is done by checking your blood sugar (glucose) after you have not eaten for a while (fasting). You may have this done every 1-3 years.  Discuss your test results, treatment options, and if necessary, the need for more tests with your health care provider. Vaccines Your health care provider may recommend certain vaccines, such as:  Influenza vaccine. This is recommended every year.  Tetanus, diphtheria, and acellular pertussis (Tdap, Td) vaccine. You may need a Td booster every 10 years.  Varicella vaccine. You may need this if you have not been vaccinated.  Zoster vaccine. You may need this after age 45.  Measles, mumps, and rubella (MMR) vaccine. You may need at least one dose of MMR if you were born in 1957 or later. You may also need a second dose.  Pneumococcal 13-valent conjugate (PCV13) vaccine. You may need this if you have certain conditions and have not been vaccinated.  Pneumococcal polysaccharide (PPSV23) vaccine. You may need one or two doses if you smoke cigarettes or if you have certain conditions.  Meningococcal vaccine. You may need this if you have certain conditions.  Hepatitis A vaccine. You may  need this if you have certain conditions or if you travel or work in places where you may be exposed to hepatitis A.  Hepatitis B vaccine. You may need this if you have certain conditions or if you travel or work in places where you may be exposed to hepatitis B.  Haemophilus influenzae type b (Hib) vaccine. You may need this if you have certain  risk factors.  Talk to your health care provider about which screenings and vaccines you need and how often you need them. This information is not intended to replace advice given to you by your health care provider. Make sure you discuss any questions you have with your health care provider. Document Released: 01/17/2015 Document Revised: 09/10/2015 Document Reviewed: 10/22/2014 Elsevier Interactive Patient Education  2018 [employer].   Diabetes Mellitus and Nutrition When you have diabetes (diabetes mellitus), it is very important to have healthy eating habits because your blood sugar (glucose) levels are greatly affected by what you eat and drink. Eating healthy foods in the appropriate amounts, at about the same times every day, can help you:  Control your blood glucose.  Lower your risk of heart disease.  Improve your blood pressure.  Reach or maintain a healthy weight.  Every person with diabetes is different, and each person has different needs for a meal plan. Your health care provider may recommend that you work with a diet and nutrition specialist (dietitian) to make a meal plan that is best for you. Your meal plan may vary depending on factors such as:  The calories you need.  The medicines you take.  Your weight.  Your blood glucose, blood pressure, and cholesterol levels.  Your activity level.  Other health conditions you have, such as heart or kidney disease.  How do carbohydrates affect me? Carbohydrates affect your blood glucose level more than any other type of food. Eating carbohydrates naturally increases the amount of glucose in your blood. Carbohydrate counting is a method for keeping track of how many carbohydrates you eat. Counting carbohydrates is important to keep your blood glucose at a healthy level, especially if you use insulin or take certain oral diabetes medicines. It is important to know how many carbohydrates you can safely have in each meal.  This is different for every person. Your dietitian can help you calculate how many carbohydrates you should have at each meal and for snack. Foods that contain carbohydrates include:  Bread, cereal, rice, pasta, and crackers.  Potatoes and corn.  Peas, beans, and lentils.  Milk and yogurt.  Fruit and juice.  Desserts, such as cakes, cookies, ice cream, and candy.  How does alcohol affect me? Alcohol can cause a sudden decrease in blood glucose (hypoglycemia), especially if you use insulin or take certain oral diabetes medicines. Hypoglycemia can be a life-threatening condition. Symptoms of hypoglycemia (sleepiness, dizziness, and confusion) are similar to symptoms of having too much alcohol. If your health care provider says that alcohol is safe for you, follow these guidelines:  Limit alcohol intake to no more than 1 drink per day for nonpregnant women and 2 drinks per day for men. One drink equals 12 oz of beer, 5 oz of wine, or 1 oz of hard liquor.  Do not drink on an empty stomach.  Keep yourself hydrated with water, diet soda, or unsweetened iced tea.  Keep in mind that regular soda, juice, and other mixers may contain a lot of sugar and must be counted as carbohydrates.  What are  tips for following this plan? Reading food labels  Start by checking the serving size on the label. The amount of calories, carbohydrates, fats, and other nutrients listed on the label are based on one serving of the food. Many foods contain more than one serving per package.  Check the total grams (g) of carbohydrates in one serving. You can calculate the number of servings of carbohydrates in one serving by dividing the total carbohydrates by 15. For example, if a food has 30 g of total carbohydrates, it would be equal to 2 servings of carbohydrates.  Check the number of grams (g) of saturated and trans fats in one serving. Choose foods that have low or no amount of these fats.  Check the number  of milligrams (mg) of sodium in one serving. Most people should limit total sodium intake to less than 2,300 mg per day.  Always check the nutrition information of foods labeled as "low-fat" or "nonfat". These foods may be higher in added sugar or refined carbohydrates and should be avoided.  Talk to your dietitian to identify your daily goals for nutrients listed on the label. Shopping  Avoid buying canned, premade, or processed foods. These foods tend to be high in fat, sodium, and added sugar.  Shop around the outside edge of the grocery store. This includes fresh fruits and vegetables, bulk grains, fresh meats, and fresh dairy. Cooking  Use low-heat cooking methods, such as baking, instead of high-heat cooking methods like deep frying.  Cook using healthy oils, such as olive, canola, or sunflower oil.  Avoid cooking with butter, cream, or high-fat meats. Meal planning  Eat meals and snacks regularly, preferably at the same times every day. Avoid going long periods of time without eating.  Eat foods high in fiber, such as fresh fruits, vegetables, beans, and whole grains. Talk to your dietitian about how many servings of carbohydrates you can eat at each meal.  Eat 4-6 ounces of lean protein each day, such as lean meat, chicken, fish, eggs, or tofu. 1 ounce is equal to 1 ounce of meat, chicken, or fish, 1 egg, or 1/4 cup of tofu.  Eat some foods each day that contain healthy fats, such as avocado, nuts, seeds, and fish. Lifestyle   Check your blood glucose regularly.  Exercise at least 30 minutes 5 or more days each week, or as told by your health care provider.  Take medicines as told by your health care provider.  Do not use any products that contain nicotine or tobacco, such as cigarettes and e-cigarettes. If you need help quitting, ask your health care provider.  Work with a Social worker or diabetes educator to identify strategies to manage stress and any emotional and social  challenges. What are some questions to ask my health care provider?  Do I need to meet with a diabetes educator?  Do I need to meet with a dietitian?  What number can I call if I have questions?  When are the best times to check my blood glucose? Where to find more information:  American Diabetes Association: diabetes.org/food-and-fitness/food  Academy of Nutrition and Dietetics: PokerClues.dk  [employer] of Diabetes and Digestive and Kidney Diseases (NIH): ContactWire.be Summary  A healthy meal plan will help you control your blood glucose and maintain a healthy lifestyle.  Working with a diet and nutrition specialist (dietitian) can help you make a meal plan that is best for you.  Keep in mind that carbohydrates and alcohol have immediate effects on  your blood glucose levels. It is important to count carbohydrates and to use alcohol carefully. This information is not intended to replace advice given to you by your health care provider. Make sure you discuss any questions you have with your health care provider. Document Released: 09/17/2004 Document Revised: 01/26/2016 Document Reviewed: 01/26/2016 Elsevier Interactive Patient Education  [employer].

## 2017-12-09 ENCOUNTER — Other Ambulatory Visit: Payer: Self-pay

## 2017-12-09 ENCOUNTER — Ambulatory Visit (INDEPENDENT_AMBULATORY_CARE_PROVIDER_SITE_OTHER): Payer: Medicare HMO | Admitting: Family Medicine

## 2017-12-09 ENCOUNTER — Encounter: Payer: Self-pay | Admitting: Family Medicine

## 2017-12-09 VITALS — BP 121/79 | HR 77 | Temp 97.0°F | Ht 73.0 in | Wt 268.0 lb

## 2017-12-09 DIAGNOSIS — E1159 Type 2 diabetes mellitus with other circulatory complications: Secondary | ICD-10-CM | POA: Diagnosis not present

## 2017-12-09 DIAGNOSIS — E1169 Type 2 diabetes mellitus with other specified complication: Secondary | ICD-10-CM

## 2017-12-09 DIAGNOSIS — E1165 Type 2 diabetes mellitus with hyperglycemia: Secondary | ICD-10-CM

## 2017-12-09 DIAGNOSIS — E785 Hyperlipidemia, unspecified: Secondary | ICD-10-CM | POA: Diagnosis not present

## 2017-12-09 DIAGNOSIS — G8929 Other chronic pain: Secondary | ICD-10-CM | POA: Diagnosis not present

## 2017-12-09 DIAGNOSIS — I10 Essential (primary) hypertension: Secondary | ICD-10-CM

## 2017-12-09 DIAGNOSIS — M79671 Pain in right foot: Secondary | ICD-10-CM | POA: Diagnosis not present

## 2017-12-09 DIAGNOSIS — Z1211 Encounter for screening for malignant neoplasm of colon: Secondary | ICD-10-CM

## 2017-12-09 LAB — BAYER DCA HB A1C WAIVED: HB A1C (BAYER DCA - WAIVED): 8.5 % — ABNORMAL HIGH

## 2017-12-09 MED ORDER — SIMVASTATIN 40 MG PO TABS: 40.0000 mg | ORAL_TABLET | Freq: Every day | ORAL | 3 refills | Status: DC

## 2017-12-09 MED ORDER — OXYCODONE HCL 10 MG PO TABS: 10.0000 mg | ORAL_TABLET | Freq: Four times a day (QID) | ORAL | 0 refills | Status: DC | PRN

## 2017-12-09 MED ORDER — LISINOPRIL 20 MG PO TABS: 20.0000 mg | ORAL_TABLET | Freq: Every day | ORAL | 3 refills | Status: DC

## 2017-12-09 MED ORDER — OXYCODONE HCL 10 MG PO TABS: 10.0000 mg | ORAL_TABLET | Freq: Four times a day (QID) | ORAL | 0 refills | Status: DC

## 2017-12-09 MED ORDER — EMPAGLIFLOZIN 25 MG PO TABS: 25.0000 mg | ORAL_TABLET | Freq: Every day | ORAL | 3 refills | Status: DC

## 2017-12-09 MED ORDER — GLIPIZIDE ER 10 MG PO TB24: 10.0000 mg | ORAL_TABLET | Freq: Every day | ORAL | 3 refills | Status: DC

## 2017-12-09 MED ORDER — METFORMIN HCL 1000 MG PO TABS: 1000.0000 mg | ORAL_TABLET | Freq: Two times a day (BID) | ORAL | 3 refills | Status: DC

## 2017-12-09 NOTE — Progress Notes (Signed)
BP 121/79   Pulse 77   Temp (!) 97 F (36.1 C) (Oral)   Ht 6\' 1"  (1.854 m)   Wt 268 lb (121.6 kg)   BMI 35.36 kg/m    Subjective:    Patient ID: Timothy Bouche., male    DOB: Mar 11, 1965, 52 y.o.   MRN: [id number]  HPI: Timothy Nelson. is a 52 y.o. male presenting on 12/09/2017 for Diabetes (3 month follow up) and Hypertension   HPI Type 2 diabetes mellitus Patient comes in today for recheck of his diabetes. Patient has been currently taking Jardiance and glipizide and metformin. Patient is currently on an ACE inhibitor/ARB. Patient has seen an ophthalmologist this year. 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  His sensation is still gone in his right foot but his left foot is still intact.  Hyperlipidemia Patient is coming in for recheck of his hyperlipidemia. The patient is currently taking simvastatin. They deny any issues with myalgias or history of liver damage from it. They deny any focal numbness or weakness or chest pain.   Hypertension Patient is currently on senna pill, and their blood pressure today is 121/79. Patient denies any lightheadedness or dizziness. Patient denies headaches, blurred vision, chest pains, shortness of breath, or weakness. Denies any side effects from medication and is content with current medication.   Patient is coming in for refill of pain medication for his chronic pain in both of his feet and ankles.  He says the medication has been managed for him and he is finally starting to heal on his ankles and he did see a podiatrist to get some fitted and shaped shoes hoping that would reduce some of his pain.  Relevant past medical, surgical, family and social history reviewed and updated as indicated. Interim medical history since our last visit reviewed. Allergies and  medications reviewed and updated.  Review of Systems  Constitutional: Negative for chills and fever.  Eyes: Negative for visual disturbance.  Respiratory: Negative for shortness of breath and wheezing.   Cardiovascular: Negative for chest pain and leg swelling.  Musculoskeletal: Negative for back pain and gait problem.  Skin: Negative for rash.  Neurological: Negative for dizziness, weakness, light-headedness and numbness.  All other systems reviewed and are negative.   Per HPI unless specifically indicated above   Allergies as of 12/09/2017      Reactions   Fenofibrate Rash   Permament rash.      Medication List        Accurate as of 12/09/17  1:53 PM. Always use your most recent med list.          accu-chek softclix lancets Use as instructed   CINNAMON PO Take 1 capsule by mouth 2 (two) times daily.   empagliflozin 25 MG Tabs tablet Commonly known as:  JARDIANCE Take 25 mg by mouth daily.   fluticasone 50 MCG/ACT nasal spray Commonly known as:  FLONASE PLACE 1 SPRAY INTO BOTH NOSTRILS 2 (TWO) TIMES DAILY AS NEEDED FOR ALLERGIES OR RHINITIS.   glipiZIDE 10 MG 24 hr tablet Commonly known as:  GLUCOTROL XL Take 1 tablet (10 mg total) by mouth daily with breakfast.   glucose blood test strip Use as instructed   ketoconazole 2 % cream Commonly known as:  NIZORAL Apply 1  application topically daily.   lisinopril 20 MG tablet Commonly known as:  PRINIVIL,ZESTRIL Take 1 tablet (20 mg total) by mouth daily.   metFORMIN 1000 MG tablet Commonly known as:  GLUCOPHAGE Take 1 tablet (1,000 mg total) by mouth 2 (two) times daily with a meal.   metoCLOPramide 10 MG tablet Commonly known as:  REGLAN TAKE 1 TABLET TWICE DAILY   mupirocin ointment 2 % Commonly known as:  BACTROBAN Apply 1 application topically 2 (two) times daily.   Oxycodone HCl 10 MG Tabs Take 1 tablet (10 mg total) by mouth 4 (four) times daily as needed. Do not refill until 30 days from  prescription date   Oxycodone HCl 10 MG Tabs Take 1 tablet (10 mg total) by mouth 4 (four) times daily.   Oxycodone HCl 10 MG Tabs Take 1 tablet (10 mg total) by mouth 4 (four) times daily as needed. Do not refill until 60 days from prescription date   simvastatin 40 MG tablet Commonly known as:  ZOCOR Take 1 tablet (40 mg total) by mouth daily at 6 PM.          Objective:    BP 121/79   Pulse 77   Temp (!) 97 F (36.1 C) (Oral)   Ht 6\' 1"  (1.854 m)   Wt 268 lb (121.6 kg)   BMI 35.36 kg/m   Wt Readings from Last 3 Encounters:  12/09/17 268 lb (121.6 kg)  12/06/17 268 lb (121.6 kg)  09/12/17 274 lb 6.4 oz (124.5 kg)    Physical Exam Vitals signs and nursing note reviewed.  Constitutional:      General: He is not in acute distress.    Appearance: He is well-developed. He is not diaphoretic.  Eyes:     General: No scleral icterus.    Conjunctiva/sclera: Conjunctivae normal.  Neck:     Musculoskeletal: Neck supple.     Thyroid: No thyromegaly.  Cardiovascular:     Rate and Rhythm: Normal rate and regular rhythm.     Heart sounds: Normal heart sounds. No murmur.  Pulmonary:     Effort: Pulmonary effort is normal. No respiratory distress.     Breath sounds: Normal breath sounds. No wheezing.  Musculoskeletal: Normal range of motion.  Lymphadenopathy:     Cervical: No cervical adenopathy.  Skin:    General: Skin is warm and dry.     Findings: No rash.  Neurological:     Mental Status: He is alert and oriented to person, place, and time.     Coordination: Coordination normal.  Psychiatric:        Behavior: Behavior normal.         Assessment & Plan:   Problem List Items Addressed This Visit      Cardiovascular and Mediastinum   Hypertension associated with diabetes ([place])   Relevant Medications   empagliflozin (JARDIANCE) 25 MG TABS tablet   glipiZIDE (GLUCOTROL XL) 10 MG 24 hr tablet   lisinopril (PRINIVIL,ZESTRIL) 20 MG tablet   metFORMIN  (GLUCOPHAGE) 1000 MG tablet   simvastatin (ZOCOR) 40 MG tablet     Endocrine   Diabetes type 2, uncontrolled (HCC) - Primary   Relevant Medications   empagliflozin (JARDIANCE) 25 MG TABS tablet   glipiZIDE (GLUCOTROL XL) 10 MG 24 hr tablet   lisinopril (PRINIVIL,ZESTRIL) 20 MG tablet   metFORMIN (GLUCOPHAGE) 1000 MG tablet   simvastatin (ZOCOR) 40 MG tablet   Other Relevant Orders   Bayer DCA Hb A1c Waived (Completed)  Hyperlipidemia associated with type 2 diabetes mellitus (HCC)   Relevant Medications   empagliflozin (JARDIANCE) 25 MG TABS tablet   glipiZIDE (GLUCOTROL XL) 10 MG 24 hr tablet   lisinopril (PRINIVIL,ZESTRIL) 20 MG tablet   metFORMIN (GLUCOPHAGE) 1000 MG tablet   simvastatin (ZOCOR) 40 MG tablet     Other   Chronic foot pain       Follow up plan: Return in about 3 months (around 03/10/2018), or if symptoms worsen or fail to improve, for Type 2 diabetes recheck.  Counseling provided for all of the vaccine components No orders of the defined types were placed in this encounter.   Caryl Pina, MD [facility] 12/09/2017, 1:53 PM

## 2017-12-11 ENCOUNTER — Encounter: Payer: Self-pay | Admitting: Family Medicine

## 2017-12-11 DIAGNOSIS — M79671 Pain in right foot: Principal | ICD-10-CM

## 2017-12-11 DIAGNOSIS — G8929 Other chronic pain: Secondary | ICD-10-CM

## 2017-12-12 ENCOUNTER — Telehealth: Payer: Self-pay | Admitting: Family Medicine

## 2017-12-12 NOTE — Telephone Encounter (Signed)
Spoke with pt and advised the Oxycodone Hcl is the same that Dr Dettinger has been prescribing him and also Dr Dettinger is out of the office till tomorrow and usually the PCP is the only person who can rx narcotics but I would send to covering provider to see if they would sign off. Pt states he cancelled the rx with [employer]. Please advise.

## 2017-12-13 MED ORDER — OXYCODONE HCL 10 MG PO TABS: 10.0000 mg | ORAL_TABLET | Freq: Four times a day (QID) | ORAL | 0 refills | Status: DC | PRN

## 2017-12-13 MED ORDER — OXYCODONE HCL 10 MG PO TABS: 10.0000 mg | ORAL_TABLET | Freq: Four times a day (QID) | ORAL | 0 refills | Status: DC

## 2017-12-13 NOTE — Telephone Encounter (Signed)
Please let him know that I have resent it to his normal pharmacy and please cancel the [place] prescriptions

## 2017-12-13 NOTE — Telephone Encounter (Signed)
Spoke to [facility] and they were able to cancel 2 of the prescriptions for the oxycodone but one was too far along in processing. Pt tried to get his rx from [facility] but was told they couldn't fill it due to [facility] processing it. Spoke to Dr Building control surveyor and he said to call [facility] to see if we gave a verbal if they could fill it. [facility] said it was able to go through insurance so they must have been able to cancel it at [facility] and [facility] can fill it now. Pt aware rx getting filled.

## 2018-01-10 DIAGNOSIS — L602 Onychogryphosis: Secondary | ICD-10-CM | POA: Diagnosis not present

## 2018-01-10 DIAGNOSIS — E1142 Type 2 diabetes mellitus with diabetic polyneuropathy: Secondary | ICD-10-CM | POA: Diagnosis not present

## 2018-01-10 DIAGNOSIS — L859 Epidermal thickening, unspecified: Secondary | ICD-10-CM | POA: Diagnosis not present

## 2018-01-16 ENCOUNTER — Encounter: Payer: Self-pay | Admitting: Gastroenterology

## 2018-01-26 ENCOUNTER — Other Ambulatory Visit: Payer: Self-pay | Admitting: Family Medicine

## 2018-01-26 DIAGNOSIS — J0100 Acute maxillary sinusitis, unspecified: Secondary | ICD-10-CM

## 2018-02-07 ENCOUNTER — Ambulatory Visit: Payer: Medicare HMO | Admitting: Gastroenterology

## 2018-02-07 ENCOUNTER — Other Ambulatory Visit (INDEPENDENT_AMBULATORY_CARE_PROVIDER_SITE_OTHER): Payer: Medicare HMO

## 2018-02-07 ENCOUNTER — Encounter: Payer: Self-pay | Admitting: Gastroenterology

## 2018-02-07 VITALS — BP 110/70 | HR 88 | Ht 73.0 in | Wt 263.8 lb

## 2018-02-07 DIAGNOSIS — K3184 Gastroparesis: Secondary | ICD-10-CM | POA: Diagnosis not present

## 2018-02-07 DIAGNOSIS — L290 Pruritus ani: Secondary | ICD-10-CM

## 2018-02-07 DIAGNOSIS — R143 Flatulence: Secondary | ICD-10-CM

## 2018-02-07 DIAGNOSIS — K22719 Barrett's esophagus with dysplasia, unspecified: Secondary | ICD-10-CM | POA: Diagnosis not present

## 2018-02-07 LAB — CBC WITH DIFFERENTIAL/PLATELET
Basophils Absolute: 0.1 10*3/uL (ref 0.0–0.1)
Basophils Relative: 0.5 % (ref 0.0–3.0)
Eosinophils Absolute: 0.2 10*3/uL (ref 0.0–0.7)
Eosinophils Relative: 2.4 % (ref 0.0–5.0)
HCT: 53.2 % — ABNORMAL HIGH (ref 39.0–52.0)
Hemoglobin: 18.2 g/dL (ref 13.0–17.0)
Lymphocytes Relative: 22.5 % (ref 12.0–46.0)
Lymphs Abs: 2.1 10*3/uL (ref 0.7–4.0)
MCHC: 34.2 g/dL (ref 30.0–36.0)
MCV: 92.8 fl (ref 78.0–100.0)
Monocytes Absolute: 0.7 10*3/uL (ref 0.1–1.0)
Monocytes Relative: 7.8 % (ref 3.0–12.0)
Neutro Abs: 6.3 10*3/uL (ref 1.4–7.7)
Neutrophils Relative %: 66.8 % (ref 43.0–77.0)
Platelets: 245 10*3/uL (ref 150.0–400.0)
RBC: 5.73 Mil/uL (ref 4.22–5.81)
RDW: 13.7 % (ref 11.5–15.5)
WBC: 9.4 10*3/uL (ref 4.0–10.5)

## 2018-02-07 LAB — HEPATIC FUNCTION PANEL
ALT: 34 U/L (ref 0–53)
AST: 20 U/L (ref 0–37)
Albumin: 4.6 g/dL (ref 3.5–5.2)
Alkaline Phosphatase: 103 U/L (ref 39–117)
Bilirubin, Direct: 0.1 mg/dL (ref 0.0–0.3)
Total Bilirubin: 0.4 mg/dL (ref 0.2–1.2)
Total Protein: 7.3 g/dL (ref 6.0–8.3)

## 2018-02-07 LAB — BUN: BUN: 18 mg/dL (ref 6–23)

## 2018-02-07 LAB — TSH: TSH: 2.13 u[IU]/mL (ref 0.35–4.50)

## 2018-02-07 LAB — CREATININE, SERUM: Creatinine, Ser: 1.05 mg/dL (ref 0.40–1.50)

## 2018-02-07 MED ORDER — NA SULFATE-K SULFATE-MG SULF 17.5-3.13-1.6 GM/177ML PO SOLN: 1.0000 | ORAL | 0 refills | Status: DC

## 2018-02-07 MED ORDER — OMEPRAZOLE 40 MG PO CPDR: 40.0000 mg | DELAYED_RELEASE_CAPSULE | Freq: Every day | ORAL | 3 refills | Status: DC

## 2018-02-07 NOTE — Patient Instructions (Addendum)
*   Keep your bottom clean * Avoid moisture in the anal area * Apply zinc oxide to the anal area ([place]aux Paste)  * Avoid tight fitting clothes * Wear cotton underpants * Avoid coffee, cola, beer, tomatoes, chocolate, tea, and citrus fruits as these can exacerbate anal itching * Add a daily probiotic to see if this can help with your gas. My recommendation is Align, but, others may work just as well for you.  * I am recommending a colonoscopy to evaluate the anal itching * I am recommending an upper endoscopy to evaluate your Barrett's esophagus * Add omeprazole 40 mg daily given the history of Barrett's esophagus

## 2018-02-07 NOTE — Progress Notes (Signed)
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 Laural Golden 04/07/15    - not currently on PPI    - no follow-up endoscopy Gastroparesis     - based on retained food seen on EGD 04/07/15    - thought to be secondary to diabetes mellitus and chronic narcotic use    - improved on metoclopramide and dietary measures    - Dr. Laural Golden suggested a switch to domperidone 2017 and if it works will stop metoclopramide. Screening colonoscopy in [place]  Laboratory tests to rule out systemic diseases associated with anal pruritus. I have also recommended a colonoscopy with particular attention to  the distal rectum and anal verge should be inspected in retroflexion. However, symptoms may be related to his chronic anal fissure. Consider treatment of pinworm if there are your children in the house.    PLAN: EGD with Barrett's biopsies to confirm the diagnosis Add omeprazole 40 mg daily CBC, hepatic function panel, TSH, BUN/creatinine, HIV to evaluate causes of anal pruritus Colonoscopy with evaluation of the rectum Recommend: improving anal hygiene, avoiding moisture in the anal area, removing offending agents, dietary modification, and protection of skin Reviewed anal pruritus recommendations with the patient    * Keep your bottom clean    * Avoid moisture in the anal area    * Apply zinc oxide to the anal area    * Avoid tight fitting clothes    * Wear cotton underpants    * Avoid coffee, cola, beer, tomatoes, chocolate, tea, and citrus fruits as these can exacerbate anal itching Add daily Probiotics (Align recommended) given the malodorous flatus Obtain prior colonoscopy report  HPI: Timothy Nelson. 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   The history is obtained through the patient and review of his electronic health record. Disabled when he fell off a room and fractured multiple lower extremity fractures. He has diabetes, hypercholesterolemia, hypertension and obesity (BMI 34.8).   He was initially seen by Dr. Laural Golden in March 2017 for GERD symptoms unresponsive to therapy. He underwent EGD on 04/07/2015 revealing short segment Barrett's esophagus(confirmed with biopsy, no dysphagia) and stomach for the food debris and patent pylorus. Gastric biopsies were negative for H pylori.  He was felt to have gastroparesis secondary to diabetes mellitus(pain medication may also be contributing to gastric dysmotility). Patient was advised to continue PPI and begun on metoclopramide. He was seen in the office on 05/13/2015 and was doing well. He was not having any side effects with metoclopramide. At the time of his last visit in [place], he reported bloating with certain foods such as pizza and onions. Dr. Laural Golden suggested a switch to domperidone 2017 and if it works will stop metoclopramide.  Two months ago he developed constant anal itching.  No change with bowel movements. Increased in the evening but present all day. Symptoms awake him from sleep 1-2 weekly.  Using toilet paper and flushable wipes. Chronic constipation on oxycodone. No fecal seepage, diarrhea, or a change in bowel habits. drainage, or with chronic purulent drainage and a pustule-like lesion in the perianal or buttock area.  No dietary changes. Tried Preparation H, Anusol gel, which is ony hurting. No new prescription or OTC medications. No new soaps, detergents,  purfumes, frequency of cleaning.  Changed to breathable boxers. None of this has resulted in any improvement.   Nightly malodorous flatus for many, many years. Occurs daily. No change in frequency. However, wife is frustrated by the odor. No change in bowel habits. No eructation.   He quit smoking New Years.    Colonoscopy in 2015 in [place]. He was told to continue with routine colon cancer screening.  He has a history of anal fissures. Denies any recent rectal pain or bleeding.    Past Medical History:  Diagnosis Date  . Arthritis   . Barrett's esophagus   . Chronic pain   . Diabetes ([place])   . Essential hypertension   . Gastroparesis   . GERD (gastroesophageal reflux disease)   . Hyperlipidemia   . Obesity   . Type 2 diabetes mellitus ([place])     Past Surgical History:  Procedure Laterality Date  . ESOPHAGOGASTRODUODENOSCOPY (EGD) WITH PROPOFOL N/A 04/04/2015   Procedure: ESOPHAGOGASTRODUODENOSCOPY (EGD) WITH PROPOFOL;  Surgeon: Rogene Houston, MD;  Location: [facility];  Service: Endoscopy;  Laterality: N/A;  8:30  . FRACTURE SURGERY  March 2007   Left arm, left thumb, bilateral legs  . HIP SURGERY Right   . WRIST FRACTURE SURGERY Left     Current Outpatient Medications  Medication Sig Dispense Refill  . CINNAMON PO Take 1 capsule by mouth 2 (two) times daily.    . empagliflozin (JARDIANCE) 25 MG TABS tablet Take 25 mg by mouth daily. 90 tablet 3  . fluticasone (FLONASE) 50 MCG/ACT nasal spray PLACE 1 SPRAY INTO BOTH NOSTRILS 2 (TWO) TIMES DAILY AS NEEDED FOR ALLERGIES OR RHINITIS. 48 g 4  . glipiZIDE (GLUCOTROL XL) 10 MG 24 hr tablet Take 1 tablet (10 mg total) by mouth daily with breakfast. 90 tablet 3  . glucose blood (ACCU-CHEK AVIVA) test strip Use as instructed 100 each 12  . ketoconazole (NIZORAL) 2 % cream Apply 1 application topically daily. 60 g 2  . Lancet Devices (ACCU-CHEK SOFTCLIX) lancets Use as instructed 1 each 0  . lisinopril (PRINIVIL,ZESTRIL) 20 MG tablet Take 1 tablet (20 mg total) by mouth daily. 90 tablet 3  . metFORMIN (GLUCOPHAGE) 1000 MG tablet Take 1 tablet (1,000 mg total) by mouth 2 (two) times daily with a meal. 180 tablet 3  . Oxycodone HCl 10 MG TABS Take 1 tablet (10 mg total) by mouth 4 (four) times daily as needed. Do not refill until 30 days from  prescription date 120 tablet 0  . simvastatin (ZOCOR) 40 MG tablet Take 1 tablet (40 mg total) by mouth daily at 6 PM. 90 tablet 3  . Na Sulfate-K Sulfate-Mg Sulf 17.5-3.13-1.6 GM/177ML SOLN Take 1 kit by mouth as directed. 354 mL 0  . omeprazole (PRILOSEC) 40 MG capsule Take 1 capsule (40 mg total) by mouth daily. 30 capsule 3   No current facility-administered medications for this visit.     Allergies as of 02/07/2018 - Review Complete 02/07/2018  Allergen Reaction Noted  . Fenofibrate Rash 09/11/2014    Family History  Problem Relation Age of Onset  . Diabetes Mother   . Hypertension Mother   . Hyperlipidemia Mother   . Arthritis Mother   . Esophageal cancer Mother        patient unsure  . Diabetes Father   . Heart disease Father        Age 55  . Kidney disease Father   . Hypertension Sister   . Diabetes  Daughter   . Colon cancer Neg Hx   . Stomach cancer Neg Hx   . Pancreatic cancer Neg Hx   . Liver disease Neg Hx     Social History   Socioeconomic History  . Marital status: Married    Spouse name: Not on file  . Number of children: 1  . Years of education: Not on file  . Highest education level: Associate degree: occupational, Hotel manager, or vocational program  Occupational History  . Occupation: Disability  Social Needs  . Financial resource strain: Not hard at all  . Food insecurity:    Worry: Never true    Inability: Never true  . Transportation needs:    Medical: No    Non-medical: No  Tobacco Use  . Smoking status: Current Some Day Smoker    Packs/day: 1.00    Years: 20.00    Pack years: 20.00    Types: Cigars  . Smokeless tobacco: Never Used  . Tobacco comment: Smoked intermittently for 20 years; 1-2 cigars weekly as of 24/2020  Substance and Sexual Activity  . Alcohol use: Yes    Alcohol/week: 1.0 standard drinks    Types: 1 Glasses of wine per week    Comment: glass wine a week.   . Drug use: No  . Sexual activity: Yes  Lifestyle  .  Physical activity:    Days per week: 0 days    Minutes per session: 0 min  . Stress: Not at all  Relationships  . Social connections:    Talks on phone: More than three times a week    Gets together: More than three times a week    Attends religious service: Never    Active member of club or organization: Yes    Attends meetings of clubs or organizations: More than 4 times per year    Relationship status: Married  . Intimate partner violence:    Fear of current or ex partner: No    Emotionally abused: No    Physically abused: No    Forced sexual activity: No  Other Topics Concern  . Not on file  Social History Narrative  . Not on file    Review of Systems: 12 system ROS is negative except as noted above.  Filed Weights   02/07/18 0919  Weight: 263 lb 12.8 oz (119.7 kg)    Physical Exam: Vital signs were reviewed. General:   Alert, well-nourished, pleasant and cooperative in NAD Head:  Normocephalic and atraumatic. Eyes:  Sclera clear, no icterus.   Conjunctiva pink. Mouth:  No deformity or lesions.   Neck:  Supple; no thyromegaly. Lungs:  Clear throughout to auscultation.   No wheezes. Heart:  Regular rate and rhythm; no murmurs Abdomen:  Soft, nontender, normal bowel sounds. No rebound or guarding. No hepatosplenomegaly Rectal:   Perianal dermatitis. No external hemorrhoids. Evidence for a chronic fissure in the 7-7 o'clock position with raised edges and fibrotic appearance at the base. He localizes the fissure location as the center of the pruritus.  No prolapsing hemorrhoids. No fistula.  No rectal prolapse. No perianal plaques. No condyloma. No mass. Normal anocutaneous reflex. Scant brown stool in the rectal vault. No mass or fecal impaction. Normal anal resting tone.  Msk:  Symmetrical without gross deformities. Extremities:  No gross deformities or edema. Neurologic:  Alert and  oriented x4;  grossly nonfocal Skin:  Multiple rashes on his legs and arms. Multiple  tattoos.  Psych:  Alert and cooperative. Normal mood  and affect.   Joanthony Hamza L. Tarri Glenn, MD, MPH [facility] 02/07/2018, 12:45 PM

## 2018-02-08 LAB — HIV ANTIBODY (ROUTINE TESTING W REFLEX): HIV 1&2 Ab, 4th Generation: NONREACTIVE

## 2018-03-07 ENCOUNTER — Encounter: Payer: Medicare HMO | Admitting: Gastroenterology

## 2018-03-09 ENCOUNTER — Other Ambulatory Visit: Payer: Self-pay

## 2018-03-09 ENCOUNTER — Encounter: Payer: Self-pay | Admitting: Family Medicine

## 2018-03-09 ENCOUNTER — Ambulatory Visit (INDEPENDENT_AMBULATORY_CARE_PROVIDER_SITE_OTHER): Payer: Medicare HMO | Admitting: Family Medicine

## 2018-03-09 VITALS — BP 130/87 | HR 81 | Temp 97.0°F | Ht 73.0 in | Wt 258.8 lb

## 2018-03-09 DIAGNOSIS — E1169 Type 2 diabetes mellitus with other specified complication: Secondary | ICD-10-CM | POA: Diagnosis not present

## 2018-03-09 DIAGNOSIS — E1165 Type 2 diabetes mellitus with hyperglycemia: Secondary | ICD-10-CM | POA: Diagnosis not present

## 2018-03-09 DIAGNOSIS — E66812 Obesity, class 2: Secondary | ICD-10-CM

## 2018-03-09 DIAGNOSIS — M79671 Pain in right foot: Secondary | ICD-10-CM | POA: Diagnosis not present

## 2018-03-09 DIAGNOSIS — I10 Essential (primary) hypertension: Secondary | ICD-10-CM | POA: Diagnosis not present

## 2018-03-09 DIAGNOSIS — F17200 Nicotine dependence, unspecified, uncomplicated: Secondary | ICD-10-CM

## 2018-03-09 DIAGNOSIS — G8929 Other chronic pain: Secondary | ICD-10-CM

## 2018-03-09 DIAGNOSIS — Z6835 Body mass index (BMI) 35.0-35.9, adult: Secondary | ICD-10-CM

## 2018-03-09 DIAGNOSIS — K219 Gastro-esophageal reflux disease without esophagitis: Secondary | ICD-10-CM | POA: Diagnosis not present

## 2018-03-09 DIAGNOSIS — E1159 Type 2 diabetes mellitus with other circulatory complications: Secondary | ICD-10-CM

## 2018-03-09 DIAGNOSIS — E785 Hyperlipidemia, unspecified: Secondary | ICD-10-CM

## 2018-03-09 DIAGNOSIS — E661 Drug-induced obesity: Secondary | ICD-10-CM | POA: Diagnosis not present

## 2018-03-09 DIAGNOSIS — I152 Hypertension secondary to endocrine disorders: Secondary | ICD-10-CM

## 2018-03-09 DIAGNOSIS — Z1211 Encounter for screening for malignant neoplasm of colon: Secondary | ICD-10-CM

## 2018-03-09 LAB — BAYER DCA HB A1C WAIVED: HB A1C (BAYER DCA - WAIVED): 8.4 % — ABNORMAL HIGH (ref <7.0)

## 2018-03-09 MED ORDER — OXYCODONE HCL 10 MG PO TABS: 10.0000 mg | ORAL_TABLET | Freq: Four times a day (QID) | ORAL | 0 refills | Status: DC | PRN

## 2018-03-09 MED ORDER — FLUCONAZOLE 150 MG PO TABS: 150.0000 mg | ORAL_TABLET | ORAL | 0 refills | Status: DC

## 2018-03-09 NOTE — Progress Notes (Signed)
BP 130/87   Pulse 81   Temp (!) 97 F (36.1 C) (Oral)   Ht 6' 1"  (1.854 m)   Wt 258 lb 12.8 oz (117.4 kg)   BMI 34.14 kg/m    Subjective:    Patient ID: Timothy Nelson., male    DOB: 19-Aug-1965, 53 y.o.   MRN: [id number]  HPI: Timothy Nelson. is a 53 y.o. male presenting on 03/09/2018 for Diabetes (3 month follow up); Hypertension; and Referral (GI - Dr Oneida Alar)   HPI Pain assessment: Cause of pain-bilateral ankle previous fracture and deformities Pain location-bilateral ankles Pain on scale of 1-10- 5 Frequency-constant What increases pain-any movement What makes pain Better-pain medication and inserts from the podiatry Effects on ADL -unable to walk short distances without pain Any change in general medical condition-no  Current opioids rx-oxycodone 10 mg # meds rx-120/month Effectiveness of current meds-very effective very helpful Adverse reactions form pain meds-intermittent constipation Morphine equivalent-60  Pill count performed-No Last drug screen -today ( high risk q52m moderate risk q646mlow risk yearly ) Urine drug screen today- Yes Was the NCTony[place]iew[place]-yes  If yes were their any concerning findings? -No Pain contract signed on: 03/09/2018  Hypertension Patient is currently on lisinopril, and their blood pressure today is 130/87. Patient denies any lightheadedness or dizziness. Patient denies headaches, blurred vision, chest pains, shortness of breath, or weakness. Denies any side effects from medication and is content with current medication.   Hyperlipidemia Patient is coming in for recheck of his hyperlipidemia. The patient is currently taking simvastatin. They deny any issues with myalgias or history of liver damage from it. They deny any focal numbness or weakness or chest pain.   Type 2 diabetes mellitus Patient comes in today for recheck of his diabetes. Patient has been currently taking metformin and Jardiance and glipizide. Patient is currently on  an ACE inhibitor/ARB. Patient has not seen an ophthalmologist this year. Patient denies any issues with their feet.  Patient has a rash started on the top of his feet and near his anus and there is umbilicus that is very pruritic it is not his usual psoriasis looks more scaly and white rather than the pink rash that he gets on his forearms.  Discussed tobacco cessation and patient does not want to yet  Relevant past medical, surgical, family and social history reviewed and updated as indicated. Interim medical history since our last visit reviewed. Allergies and medications reviewed and updated.  Review of Systems  Constitutional: Negative for chills and fever.  Eyes: Negative for discharge and visual disturbance.  Respiratory: Negative for shortness of breath and wheezing.   Cardiovascular: Negative for chest pain and leg swelling.  Musculoskeletal: Positive for arthralgias. Negative for back pain, gait problem and joint swelling.  Skin: Negative for rash.  Neurological: Negative for dizziness, weakness and light-headedness.  All other systems reviewed and are negative.   Per HPI unless specifically indicated above   Allergies as of 03/09/2018      Reactions   Fenofibrate Rash   Permament rash.      Medication List       Accurate as of March 09, 2018  9:24 AM. Always use your most recent med list.        accu-chek softclix lancets Use as instructed   CINNAMON PO Take 1 capsule by mouth 2 (two) times daily.   empagliflozin 25 MG Tabs tablet Commonly known as:  JARDIANCE Take 25 mg by mouth daily.  fluconazole 150 MG tablet Commonly known as:  DIFLUCAN Take 1 tablet (150 mg total) by mouth once a week.   fluticasone 50 MCG/ACT nasal spray Commonly known as:  FLONASE PLACE 1 SPRAY INTO BOTH NOSTRILS 2 (TWO) TIMES DAILY AS NEEDED FOR ALLERGIES OR RHINITIS.   glipiZIDE 10 MG 24 hr tablet Commonly known as:  GLUCOTROL XL Take 1 tablet (10 mg total) by mouth daily with  breakfast.   glucose blood test strip Commonly known as:  ACCU-CHEK AVIVA Use as instructed   ketoconazole 2 % cream Commonly known as:  NIZORAL Apply 1 application topically daily.   lisinopril 20 MG tablet Commonly known as:  PRINIVIL,ZESTRIL Take 1 tablet (20 mg total) by mouth daily.   metFORMIN 1000 MG tablet Commonly known as:  GLUCOPHAGE Take 1 tablet (1,000 mg total) by mouth 2 (two) times daily with a meal.   Na Sulfate-K Sulfate-Mg Sulf 17.5-3.13-1.6 GM/177ML Soln Take 1 kit by mouth as directed.   omeprazole 40 MG capsule Commonly known as:  PRILOSEC Take 1 capsule (40 mg total) by mouth daily.   Oxycodone HCl 10 MG Tabs Take 1 tablet (10 mg total) by mouth 4 (four) times daily as needed. Do not refill until 30 days from prescription date   Oxycodone HCl 10 MG Tabs Take 1 tablet (10 mg total) by mouth 4 (four) times daily as needed. Do not refill until 60 days from prescription date   Oxycodone HCl 10 MG Tabs Take 1 tablet (10 mg total) by mouth 4 (four) times daily as needed. Do not refill until 30 days from prescription date   simvastatin 40 MG tablet Commonly known as:  ZOCOR Take 1 tablet (40 mg total) by mouth daily at 6 PM.          Objective:    BP 130/87   Pulse 81   Temp (!) 97 F (36.1 C) (Oral)   Ht 6' 1"  (1.854 m)   Wt 258 lb 12.8 oz (117.4 kg)   BMI 34.14 kg/m   Wt Readings from Last 3 Encounters:  03/09/18 258 lb 12.8 oz (117.4 kg)  02/07/18 263 lb 12.8 oz (119.7 kg)  12/09/17 268 lb (121.6 kg)    Physical Exam Vitals signs and nursing note reviewed.  Constitutional:      General: He is not in acute distress.    Appearance: He is well-developed. He is not diaphoretic.  Eyes:     General: No scleral icterus.    Conjunctiva/sclera: Conjunctivae normal.  Neck:     Musculoskeletal: Neck supple.     Thyroid: No thyromegaly.  Cardiovascular:     Rate and Rhythm: Normal rate and regular rhythm.     Heart sounds: Normal heart  sounds. No murmur.  Pulmonary:     Effort: Pulmonary effort is normal. No respiratory distress.     Breath sounds: Normal breath sounds. No wheezing.  Musculoskeletal: Normal range of motion.  Lymphadenopathy:     Cervical: No cervical adenopathy.  Skin:    General: Skin is warm and dry.     Findings: No rash.  Neurological:     Mental Status: He is alert and oriented to person, place, and time.     Coordination: Coordination normal.  Psychiatric:        Behavior: Behavior normal.       Assessment & Plan:   Problem List Items Addressed This Visit      Cardiovascular and Mediastinum   Hypertension associated with diabetes (  [place]) - Primary     Digestive   GERD (gastroesophageal reflux disease)     Endocrine   Diabetes type 2, uncontrolled ([place])   Relevant Orders   Bayer DCA Hb A1c Waived   CMP14+EGFR   Hyperlipidemia associated with type 2 diabetes mellitus ([facility])     Other   Chronic foot pain   Relevant Medications   Oxycodone HCl 10 MG TABS   Oxycodone HCl 10 MG TABS   Oxycodone HCl 10 MG TABS   Other Relevant Orders   ToxASSURE Select 13 (MW), Urine   Tobacco use disorder   Class 2 drug-induced obesity with serious comorbidity and body mass index (BMI) of 35.0 to 35.9 in adult       Follow up plan: Return in about 3 months (around 06/09/2018), or if symptoms worsen or fail to improve, for Diabetes and pain management recheck.  Counseling provided for all of the vaccine components Orders Placed This Encounter  Procedures  . ToxASSURE Select 13 (MW), Urine  . Bayer DCA Hb A1c Waived  . [place], MD [facility] 03/09/2018, 9:24 AM

## 2018-03-10 LAB — CMP14+EGFR
ALT: 38 IU/L (ref 0–44)
AST: 27 IU/L (ref 0–40)
Albumin/Globulin Ratio: 1.7 (ref 1.2–2.2)
Albumin: 4.3 g/dL (ref 3.8–4.9)
Alkaline Phosphatase: 114 IU/L (ref 39–117)
BUN/Creatinine Ratio: 18 (ref 9–20)
BUN: 17 mg/dL (ref 6–24)
Bilirubin Total: 0.2 mg/dL (ref 0.0–1.2)
CO2: 19 mmol/L — ABNORMAL LOW (ref 20–29)
Calcium: 9.6 mg/dL (ref 8.7–10.2)
Chloride: 100 mmol/L (ref 96–106)
Creatinine, Ser: 0.96 mg/dL (ref 0.76–1.27)
GFR calc Af Amer: 105 mL/min/{1.73_m2} (ref >59)
GFR calc non Af Amer: 91 mL/min/{1.73_m2} (ref >59)
Globulin, Total: 2.5 g/dL (ref 1.5–4.5)
Glucose: 166 mg/dL — ABNORMAL HIGH (ref 65–99)
Potassium: 4.8 mmol/L (ref 3.5–5.2)
Sodium: 140 mmol/L (ref 134–144)
Total Protein: 6.8 g/dL (ref 6.0–8.5)

## 2018-03-15 LAB — TOXASSURE SELECT 13 (MW), URINE

## 2018-03-16 MED ORDER — OXYCODONE HCL 10 MG PO TABS: 10.0000 mg | ORAL_TABLET | Freq: Four times a day (QID) | ORAL | 0 refills | Status: DC | PRN

## 2018-03-16 NOTE — Addendum Note (Signed)
Addended by: Caryl Pina on: 03/16/2018 03:18 PM   Modules accepted: Orders

## 2018-03-22 ENCOUNTER — Encounter: Payer: Self-pay | Admitting: Family Medicine

## 2018-03-23 ENCOUNTER — Other Ambulatory Visit: Payer: Self-pay

## 2018-03-23 DIAGNOSIS — Z1211 Encounter for screening for malignant neoplasm of colon: Secondary | ICD-10-CM

## 2018-04-06 ENCOUNTER — Encounter: Payer: Self-pay | Admitting: Family Medicine

## 2018-04-06 MED ORDER — FLUCONAZOLE 150 MG PO TABS: 150.0000 mg | ORAL_TABLET | ORAL | 0 refills | Status: DC

## 2018-05-04 ENCOUNTER — Other Ambulatory Visit: Payer: Self-pay | Admitting: Gastroenterology

## 2018-05-15 DIAGNOSIS — I10 Essential (primary) hypertension: Secondary | ICD-10-CM | POA: Diagnosis not present

## 2018-05-15 DIAGNOSIS — E119 Type 2 diabetes mellitus without complications: Secondary | ICD-10-CM | POA: Diagnosis not present

## 2018-05-15 DIAGNOSIS — R142 Eructation: Secondary | ICD-10-CM | POA: Diagnosis not present

## 2018-05-18 DIAGNOSIS — L859 Epidermal thickening, unspecified: Secondary | ICD-10-CM | POA: Diagnosis not present

## 2018-05-18 DIAGNOSIS — E1142 Type 2 diabetes mellitus with diabetic polyneuropathy: Secondary | ICD-10-CM | POA: Diagnosis not present

## 2018-05-18 DIAGNOSIS — E11621 Type 2 diabetes mellitus with foot ulcer: Secondary | ICD-10-CM | POA: Diagnosis not present

## 2018-05-18 DIAGNOSIS — L97412 Non-pressure chronic ulcer of right heel and midfoot with fat layer exposed: Secondary | ICD-10-CM | POA: Diagnosis not present

## 2018-05-18 DIAGNOSIS — L602 Onychogryphosis: Secondary | ICD-10-CM | POA: Diagnosis not present

## 2018-05-25 DIAGNOSIS — E119 Type 2 diabetes mellitus without complications: Secondary | ICD-10-CM | POA: Diagnosis not present

## 2018-05-25 DIAGNOSIS — D751 Secondary polycythemia: Secondary | ICD-10-CM | POA: Diagnosis not present

## 2018-05-25 DIAGNOSIS — R142 Eructation: Secondary | ICD-10-CM | POA: Diagnosis not present

## 2018-05-31 DIAGNOSIS — E11621 Type 2 diabetes mellitus with foot ulcer: Secondary | ICD-10-CM | POA: Diagnosis not present

## 2018-05-31 DIAGNOSIS — L97412 Non-pressure chronic ulcer of right heel and midfoot with fat layer exposed: Secondary | ICD-10-CM | POA: Diagnosis not present

## 2018-06-07 ENCOUNTER — Encounter: Payer: Self-pay | Admitting: Family Medicine

## 2018-06-07 ENCOUNTER — Ambulatory Visit (INDEPENDENT_AMBULATORY_CARE_PROVIDER_SITE_OTHER): Payer: Medicare HMO | Admitting: Family Medicine

## 2018-06-07 ENCOUNTER — Other Ambulatory Visit: Payer: Self-pay

## 2018-06-07 VITALS — BP 128/72 | HR 87 | Temp 97.6°F | Ht 73.0 in | Wt 259.2 lb

## 2018-06-07 DIAGNOSIS — G8929 Other chronic pain: Secondary | ICD-10-CM | POA: Diagnosis not present

## 2018-06-07 DIAGNOSIS — Z1211 Encounter for screening for malignant neoplasm of colon: Secondary | ICD-10-CM | POA: Diagnosis not present

## 2018-06-07 DIAGNOSIS — E1169 Type 2 diabetes mellitus with other specified complication: Secondary | ICD-10-CM | POA: Diagnosis not present

## 2018-06-07 DIAGNOSIS — E1165 Type 2 diabetes mellitus with hyperglycemia: Secondary | ICD-10-CM

## 2018-06-07 DIAGNOSIS — I10 Essential (primary) hypertension: Secondary | ICD-10-CM

## 2018-06-07 DIAGNOSIS — I152 Hypertension secondary to endocrine disorders: Secondary | ICD-10-CM

## 2018-06-07 DIAGNOSIS — E785 Hyperlipidemia, unspecified: Secondary | ICD-10-CM | POA: Diagnosis not present

## 2018-06-07 DIAGNOSIS — E1159 Type 2 diabetes mellitus with other circulatory complications: Secondary | ICD-10-CM

## 2018-06-07 DIAGNOSIS — M79671 Pain in right foot: Secondary | ICD-10-CM | POA: Diagnosis not present

## 2018-06-07 DIAGNOSIS — K219 Gastro-esophageal reflux disease without esophagitis: Secondary | ICD-10-CM | POA: Diagnosis not present

## 2018-06-07 LAB — BAYER DCA HB A1C WAIVED: HB A1C (BAYER DCA - WAIVED): 8.7 % — ABNORMAL HIGH (ref <7.0)

## 2018-06-07 MED ORDER — OXYCODONE HCL 10 MG PO TABS: 10.0000 mg | ORAL_TABLET | Freq: Four times a day (QID) | ORAL | 0 refills | Status: DC | PRN

## 2018-06-07 NOTE — Progress Notes (Signed)
BP 128/72   Pulse 87   Temp 97.6 F (36.4 C) (Oral)   Ht 6' 1"  (1.854 m)   Wt 259 lb 3.2 oz (117.6 kg)   BMI 34.20 kg/m    Subjective:   Patient ID: Timothy Bouche., male    DOB: October 13, 1965, 53 y.o.   MRN: [id number]  HPI: Timothy Ptacek. is a 53 y.o. male presenting on 06/07/2018 for Hypertension (3 month follow up- Patient states he still has a rash on his right foot that has been ongoing.); Diabetes; and Referral (colonscopy and endo )   HPI Pain assessment: Cause of pain- bilateral ankle from trauma and deformity Pain location-bilateral ankle Pain on scale of 1-10- 4 Frequency-Daily What increases pain-prolonged movement and prolonged standing What makes pain Better-medication and inserts, has a podiatrist as well Effects on ADL -limits prolonged standing and prolonged walking Any change in general medical condition-none  Current opioids rx-oxycodone 10 mg 4 times daily as needed # meds rx- 120 Effectiveness of current meds-working well Adverse reactions form pain meds-none Morphine equivalent- 60  Pill count performed-No Last drug screen - 03/09/18 ( high risk q36m moderate risk q639mlow risk yearly ) Urine drug screen today- No Was the [place]- yes  If yes were their any concerning findings? - no  No flowsheet data found.   Pain contract signed on: 03/09/18  Type 2 diabetes mellitus Patient comes in today for recheck of his diabetes. Patient has been currently taking Jardiance and metformin. Patient is currently on an ACE inhibitor/ARB. Patient has not seen an ophthalmologist this year. Patient denies any issues with their feet.   Hypertension Patient is currently on lisinopril, and their blood pressure today is 128/72. Patient denies any lightheadedness or dizziness. Patient denies headaches, blurred vision, chest pains, shortness of breath, or weakness. Denies any side effects from medication and is content with current medication.   Hyperlipidemia  Patient is coming in for recheck of his hyperlipidemia. The patient is currently taking omega-3 and simvastatin. They deny any issues with myalgias or history of liver damage from it. They deny any focal numbness or weakness or chest pain.   GERD Patient is currently on omeprazole.  She denies any major symptoms or abdominal pain or belching or burping. She denies any blood in her stool or lightheadedness or dizziness.   Relevant past medical, surgical, family and social history reviewed and updated as indicated. Interim medical history since our last visit reviewed. Allergies and medications reviewed and updated.  Review of Systems  Constitutional: Negative for chills and fever.  Respiratory: Negative for shortness of breath and wheezing.   Cardiovascular: Negative for chest pain and leg swelling.  Gastrointestinal: Negative for abdominal pain.  Musculoskeletal: Positive for arthralgias. Negative for back pain and gait problem.  Skin: Negative for rash.  All other systems reviewed and are negative.   Per HPI unless specifically indicated above   Allergies as of 06/07/2018      Reactions   Fenofibrate Rash   Permament rash.      Medication List       Accurate as of June 07, 2018  1:34 PM. If you have any questions, ask your nurse or doctor.        STOP taking these medications   fluconazole 150 MG tablet Commonly known as:  Diflucan Stopped by:  JoFransisca Kaufmannettinger, MD     TAKE these medications   accu-chek softclix lancets Use as instructed   CINNAMON PO  Take 1 capsule by mouth 2 (two) times daily.   empagliflozin 25 MG Tabs tablet Commonly known as:  Jardiance Take 25 mg by mouth daily.   fluticasone 50 MCG/ACT nasal spray Commonly known as:  FLONASE PLACE 1 SPRAY INTO BOTH NOSTRILS 2 (TWO) TIMES DAILY AS NEEDED FOR ALLERGIES OR RHINITIS.   glipiZIDE 10 MG 24 hr tablet Commonly known as:  GLUCOTROL XL Take 1 tablet (10 mg total) by mouth daily with breakfast.    glucose blood test strip Commonly known as:  Accu-Chek Aviva Use as instructed   ketoconazole 2 % cream Commonly known as:  NIZORAL Apply 1 application topically daily.   lisinopril 20 MG tablet Commonly known as:  ZESTRIL Take 1 tablet (20 mg total) by mouth daily.   metFORMIN 1000 MG tablet Commonly known as:  GLUCOPHAGE Take 1 tablet (1,000 mg total) by mouth 2 (two) times daily with a meal.   Na Sulfate-K Sulfate-Mg Sulf 17.5-3.13-1.6 GM/177ML Soln Take 1 kit by mouth as directed.   omeprazole 40 MG capsule Commonly known as:  PRILOSEC TAKE 1 CAPSULE BY MOUTH EVERY DAY   Oxycodone HCl 10 MG Tabs Take 1 tablet (10 mg total) by mouth 4 (four) times daily as needed. Do not refill until 30 days from prescription date   Oxycodone HCl 10 MG Tabs Take 1 tablet (10 mg total) by mouth 4 (four) times daily as needed. Do not refill until 60 days from prescription date   Oxycodone HCl 10 MG Tabs Take 1 tablet (10 mg total) by mouth 4 (four) times daily as needed. Do not refill until 30 days from prescription date   simvastatin 40 MG tablet Commonly known as:  ZOCOR Take 1 tablet (40 mg total) by mouth daily at 6 PM.        Objective:   BP 128/72   Pulse 87   Temp 97.6 F (36.4 C) (Oral)   Ht 6' 1"  (1.854 m)   Wt 259 lb 3.2 oz (117.6 kg)   BMI 34.20 kg/m   Wt Readings from Last 3 Encounters:  06/07/18 259 lb 3.2 oz (117.6 kg)  03/09/18 258 lb 12.8 oz (117.4 kg)  02/07/18 263 lb 12.8 oz (119.7 kg)    Physical Exam Vitals signs and nursing note reviewed.  Constitutional:      General: He is not in acute distress.    Appearance: He is well-developed. He is not diaphoretic.  Eyes:     General: No scleral icterus.    Conjunctiva/sclera: Conjunctivae normal.  Neck:     Musculoskeletal: Neck supple.     Thyroid: No thyromegaly.  Cardiovascular:     Rate and Rhythm: Normal rate and regular rhythm.     Heart sounds: Normal heart sounds. No murmur.  Pulmonary:      Effort: Pulmonary effort is normal. No respiratory distress.     Breath sounds: Normal breath sounds. No wheezing.  Lymphadenopathy:     Cervical: No cervical adenopathy.  Skin:    General: Skin is warm and dry.     Findings: No rash.  Neurological:     Mental Status: He is alert and oriented to person, place, and time.     Coordination: Coordination normal.  Psychiatric:        Behavior: Behavior normal.       Assessment & Plan:   Problem List Items Addressed This Visit      Cardiovascular and Mediastinum   Hypertension associated with diabetes ([place])  Relevant Orders   CMP14+EGFR (Completed)     Digestive   GERD (gastroesophageal reflux disease)   Relevant Orders   Ambulatory referral to Gastroenterology   CBC with Differential/Platelet (Completed)     Endocrine   Diabetes type 2, uncontrolled ([place]) - Primary   Relevant Orders   Bayer DCA Hb A1c Waived (Completed)   Hyperlipidemia associated with type 2 diabetes mellitus (HCC)   Relevant Orders   Lipid panel (Completed)     Other   Chronic foot pain   Relevant Medications   Oxycodone HCl 10 MG TABS   Oxycodone HCl 10 MG TABS   Oxycodone HCl 10 MG TABS    Other Visit Diagnoses    Colon cancer screening       Relevant Orders   Ambulatory referral to Gastroenterology      Give sample for Trulicity, will try this on top of his medications he has currently, may stop glipizide in the future because I do not think is doing much for him anymore.  Had to print the prescriptions for his oxycodone because the database was down.  Follow up plan: Return in about 3 months (around 09/07/2018), or if symptoms worsen or fail to improve, for Bilateral foot pain and hypertension and diabetes recheck.  Counseling provided for all of the vaccine components Orders Placed This Encounter  Procedures  . CBC with Differential/Platelet  . CMP14+EGFR  . Lipid panel  . Bayer DCA Hb A1c Waived  . Ambulatory referral to  Gastroenterology    Caryl Pina, MD [facility] 06/07/2018, 1:34 PM

## 2018-06-08 LAB — CMP14+EGFR
ALT: 31 IU/L (ref 0–44)
AST: 20 IU/L (ref 0–40)
Albumin/Globulin Ratio: 1.6 (ref 1.2–2.2)
Albumin: 4.3 g/dL (ref 3.8–4.9)
Alkaline Phosphatase: 110 IU/L (ref 39–117)
BUN/Creatinine Ratio: 19 (ref 9–20)
BUN: 18 mg/dL (ref 6–24)
Bilirubin Total: 0.3 mg/dL (ref 0.0–1.2)
CO2: 19 mmol/L — ABNORMAL LOW (ref 20–29)
Calcium: 9.6 mg/dL (ref 8.7–10.2)
Chloride: 100 mmol/L (ref 96–106)
Creatinine, Ser: 0.94 mg/dL (ref 0.76–1.27)
GFR calc Af Amer: 107 mL/min/{1.73_m2} (ref >59)
GFR calc non Af Amer: 93 mL/min/{1.73_m2} (ref >59)
Globulin, Total: 2.7 g/dL (ref 1.5–4.5)
Glucose: 131 mg/dL — ABNORMAL HIGH (ref 65–99)
Potassium: 4.4 mmol/L (ref 3.5–5.2)
Sodium: 140 mmol/L (ref 134–144)
Total Protein: 7 g/dL (ref 6.0–8.5)

## 2018-06-08 LAB — CBC WITH DIFFERENTIAL/PLATELET
Basophils Absolute: 0.1 10*3/uL (ref 0.0–0.2)
Basos: 1 %
EOS (ABSOLUTE): 0.3 10*3/uL (ref 0.0–0.4)
Eos: 3 %
Hematocrit: 51.9 % — ABNORMAL HIGH (ref 37.5–51.0)
Hemoglobin: 17.8 g/dL — ABNORMAL HIGH (ref 13.0–17.7)
Immature Grans (Abs): 0 10*3/uL (ref 0.0–0.1)
Immature Granulocytes: 0 %
Lymphocytes Absolute: 2.1 10*3/uL (ref 0.7–3.1)
Lymphs: 21 %
MCH: 32 pg (ref 26.6–33.0)
MCHC: 34.3 g/dL (ref 31.5–35.7)
MCV: 93 fL (ref 79–97)
Monocytes Absolute: 0.9 10*3/uL (ref 0.1–0.9)
Monocytes: 8 %
Neutrophils Absolute: 6.9 10*3/uL (ref 1.4–7.0)
Neutrophils: 67 %
Platelets: 209 10*3/uL (ref 150–450)
RBC: 5.56 x10E6/uL (ref 4.14–5.80)
RDW: 13.3 % (ref 11.6–15.4)
WBC: 10.3 10*3/uL (ref 3.4–10.8)

## 2018-06-08 LAB — LIPID PANEL
Chol/HDL Ratio: 4 ratio (ref 0.0–5.0)
Cholesterol, Total: 137 mg/dL (ref 100–199)
HDL: 34 mg/dL — ABNORMAL LOW (ref >39)
LDL Calculated: 33 mg/dL (ref 0–99)
Triglycerides: 348 mg/dL — ABNORMAL HIGH (ref 0–149)
VLDL Cholesterol Cal: 70 mg/dL — ABNORMAL HIGH (ref 5–40)

## 2018-06-10 ENCOUNTER — Encounter: Payer: Self-pay | Admitting: Family Medicine

## 2018-06-12 ENCOUNTER — Encounter: Payer: Self-pay | Admitting: Family Medicine

## 2018-06-12 MED ORDER — FISH OIL 1200 MG PO CPDR: 1200.0000 mg | DELAYED_RELEASE_CAPSULE | Freq: Every day | ORAL | 1 refills | Status: DC

## 2018-06-22 ENCOUNTER — Encounter: Payer: Self-pay | Admitting: Family Medicine

## 2018-06-23 ENCOUNTER — Encounter: Payer: Self-pay | Admitting: Family Medicine

## 2018-06-26 MED ORDER — TRIAMCINOLONE ACETONIDE 0.1 % EX CREA: 1.0000 "application " | TOPICAL_CREAM | Freq: Two times a day (BID) | CUTANEOUS | 1 refills | Status: DC

## 2018-06-26 MED ORDER — KETOCONAZOLE 2 % EX CREA: 1.0000 "application " | TOPICAL_CREAM | Freq: Every day | CUTANEOUS | 2 refills | Status: DC

## 2018-07-03 DIAGNOSIS — L859 Epidermal thickening, unspecified: Secondary | ICD-10-CM | POA: Diagnosis not present

## 2018-07-03 DIAGNOSIS — E1142 Type 2 diabetes mellitus with diabetic polyneuropathy: Secondary | ICD-10-CM | POA: Diagnosis not present

## 2018-07-03 DIAGNOSIS — E11621 Type 2 diabetes mellitus with foot ulcer: Secondary | ICD-10-CM | POA: Diagnosis not present

## 2018-07-03 DIAGNOSIS — M7742 Metatarsalgia, left foot: Secondary | ICD-10-CM | POA: Diagnosis not present

## 2018-07-03 DIAGNOSIS — L97412 Non-pressure chronic ulcer of right heel and midfoot with fat layer exposed: Secondary | ICD-10-CM | POA: Diagnosis not present

## 2018-07-04 DIAGNOSIS — R142 Eructation: Secondary | ICD-10-CM | POA: Diagnosis not present

## 2018-07-04 DIAGNOSIS — Z1211 Encounter for screening for malignant neoplasm of colon: Secondary | ICD-10-CM | POA: Diagnosis not present

## 2018-07-04 DIAGNOSIS — Z1159 Encounter for screening for other viral diseases: Secondary | ICD-10-CM | POA: Diagnosis not present

## 2018-07-20 ENCOUNTER — Telehealth: Payer: Self-pay | Admitting: Family Medicine

## 2018-07-20 NOTE — Chronic Care Management (AMB) (Signed)
Chronic Care Management   Note  07/20/2018 Name: Timothy Nelson. MRN: [id number] DOB: 1965/06/28  Timothy Nelson. is a 53 y.o. year old male who is a primary care patient of Dettinger, Fransisca Kaufmann, MD. I reached out to [name] by phone today in response to a referral sent by Mr. Leandro Berkowitz Jr.'s health plan.    Timothy Nelson was given information about Chronic Care Management services today including:  1. CCM service includes personalized support from designated clinical staff supervised by his physician, including individualized plan of care and coordination with other care providers 2. 24/7 contact phone numbers for assistance for urgent and routine care needs. 3. Service will only be billed when office clinical staff spend 20 minutes or more in a month to coordinate care. 4. Only one practitioner may furnish and bill the service in a calendar month. 5. The patient may stop CCM services at any time (effective at the end of the month) by phone call to the office staff. 6. The patient will be responsible for cost sharing (co-pay) of up to 20% of the service fee (after annual deductible is met).  Patient agreed to services and verbal consent obtained.   Follow up plan: Telephone appointment with CCM team member scheduled for: 07/28/2018  [place]  [email or web address]   ??[phone]

## 2018-07-28 ENCOUNTER — Telehealth: Payer: Medicare HMO

## 2018-08-01 ENCOUNTER — Ambulatory Visit (INDEPENDENT_AMBULATORY_CARE_PROVIDER_SITE_OTHER): Payer: Medicare HMO | Admitting: Family Medicine

## 2018-08-01 ENCOUNTER — Encounter: Payer: Self-pay | Admitting: Family Medicine

## 2018-08-01 ENCOUNTER — Other Ambulatory Visit: Payer: Self-pay

## 2018-08-01 DIAGNOSIS — H60331 Swimmer's ear, right ear: Secondary | ICD-10-CM | POA: Diagnosis not present

## 2018-08-01 MED ORDER — CIPRO HC 0.2-1 % OT SUSP: 3.0000 [drp] | Freq: Two times a day (BID) | OTIC | 0 refills | Status: AC

## 2018-08-01 MED ORDER — NEOMYCIN-POLYMYXIN-HC 3.5-10000-1 OT SOLN: 4.0000 [drp] | Freq: Four times a day (QID) | OTIC | 0 refills | Status: AC

## 2018-08-01 MED ORDER — CIPROFLOXACIN-DEXAMETHASONE 0.3-0.1 % OT SUSP: 4.0000 [drp] | Freq: Two times a day (BID) | OTIC | 0 refills | Status: AC

## 2018-08-01 NOTE — Addendum Note (Signed)
Addended by: Loman Brooklyn on: 08/01/2018 09:26 AM   Modules accepted: Orders

## 2018-08-01 NOTE — Patient Instructions (Signed)

## 2018-08-01 NOTE — Progress Notes (Addendum)
Virtual Visit via Telephone Note  I connected with Timothy Nelson. on 08/01/18 at 9:00 AM by telephone and verified that I am speaking with the correct person using two identifiers. Timothy Nelson. is currently located in his car and nobody is currently with him during this visit. The provider, Loman Brooklyn, FNP is located in their home at time of visit.  I discussed the limitations, risks, security and privacy concerns of performing an evaluation and management service by telephone and the availability of in person appointments. I also discussed with the patient that there may be a patient responsible charge related to this service. The patient expressed understanding and agreed to proceed.  Subjective: PCP: Dettinger, Fransisca Kaufmann, MD  Chief Complaint  Patient presents with   Ear Pain   Ear Pain: Patient presents with right ear pain.  Symptoms include right ear drainage  and right ear pain. Symptoms began 1 day ago and are unchanged since that time. The worse is when he tries to lay on his right side at night. Patient denies chills, dyspnea, eye irritation, fever, nasal congestion, nonproductive cough, productive cough, rhinorrhea, sneezing and sore throat. He has tried using OTC swimmers ear drops and hydrogen peroxide which have not relieved ear pain.    ROS: Per HPI  Current Outpatient Medications:    CINNAMON PO, Take 1 capsule by mouth 2 (two) times daily., Disp: , Rfl:    ciprofloxacin-dexamethasone (CIPRODEX) OTIC suspension, Place 4 drops into the right ear 2 (two) times daily for 7 days., Disp: 7.5 mL, Rfl: 0   empagliflozin (JARDIANCE) 25 MG TABS tablet, Take 25 mg by mouth daily., Disp: 90 tablet, Rfl: 3   fluticasone (FLONASE) 50 MCG/ACT nasal spray, PLACE 1 SPRAY INTO BOTH NOSTRILS 2 (TWO) TIMES DAILY AS NEEDED FOR ALLERGIES OR RHINITIS., Disp: 48 g, Rfl: 4   glipiZIDE (GLUCOTROL XL) 10 MG 24 hr tablet, Take 1 tablet (10 mg total) by mouth daily with breakfast., Disp:  90 tablet, Rfl: 3   glucose blood (ACCU-CHEK AVIVA) test strip, Use as instructed, Disp: 100 each, Rfl: 12   ketoconazole (NIZORAL) 2 % cream, Apply 1 application topically daily., Disp: 60 g, Rfl: 2   Lancet Devices (ACCU-CHEK SOFTCLIX) lancets, Use as instructed, Disp: 1 each, Rfl: 0   lisinopril (PRINIVIL,ZESTRIL) 20 MG tablet, Take 1 tablet (20 mg total) by mouth daily., Disp: 90 tablet, Rfl: 3   metFORMIN (GLUCOPHAGE) 1000 MG tablet, Take 1 tablet (1,000 mg total) by mouth 2 (two) times daily with a meal., Disp: 180 tablet, Rfl: 3   Na Sulfate-K Sulfate-Mg Sulf 17.5-3.13-1.6 GM/177ML SOLN, Take 1 kit by mouth as directed., Disp: 354 mL, Rfl: 0   Omega-3 Fatty Acids (FISH OIL) 1200 MG CPDR, Take 1,200 mg by mouth daily., Disp: 90 capsule, Rfl: 1   omeprazole (PRILOSEC) 40 MG capsule, TAKE 1 CAPSULE BY MOUTH EVERY DAY, Disp: 90 capsule, Rfl: 1   Oxycodone HCl 10 MG TABS, Take 1 tablet (10 mg total) by mouth 4 (four) times daily as needed. Do not refill until 30 days from prescription date, Disp: 120 tablet, Rfl: 0   Oxycodone HCl 10 MG TABS, Take 1 tablet (10 mg total) by mouth 4 (four) times daily as needed. Do not refill until 60 days from prescription date, Disp: 120 tablet, Rfl: 0   Oxycodone HCl 10 MG TABS, Take 1 tablet (10 mg total) by mouth 4 (four) times daily as needed. Do not refill until 30 days from  prescription date, Disp: 120 tablet, Rfl: 0   simvastatin (ZOCOR) 40 MG tablet, Take 1 tablet (40 mg total) by mouth daily at 6 PM., Disp: 90 tablet, Rfl: 3   triamcinolone cream (KENALOG) 0.1 %, Apply 1 application topically 2 (two) times daily., Disp: 454 g, Rfl: 1  Allergies  Allergen Reactions   Fenofibrate Rash    Permament rash.    Past Medical History:  Diagnosis Date   Arthritis    Barrett's esophagus    Chronic pain    Diabetes ([place])    Essential hypertension    Gastroparesis    GERD (gastroesophageal reflux disease)    Hyperlipidemia     Obesity    Type 2 diabetes mellitus ([place])     Observations/Objective: A&O  No respiratory distress or wheezing audible over the phone Mood, judgement, and thought processes all WNL  Assessment and Plan: 1. Acute swimmer's ear of right side - Education provided on Swimmer's Ear.  - ciprofloxacin-dexamethasone (CIPRODEX) OTIC suspension; Place 4 drops into the right ear 2 (two) times daily for 7 days.  Dispense: 7.5 mL; Refill: 0  ADDENDUM: Ciprodex not covered by insurance; new prescription sent.  - ciprofloxacin-hydrocortisone (CIPRO HC) OTIC suspension; Place 3 drops into the right ear 2 (two) times daily for 7 days.  Dispense: 10 mL; Refill: 0  ADDENDUM #2: Previous two prescriptions not covered by insurance; new prescription sent again.  - neomycin-polymyxin-hydrocortisone (CORTISPORIN) OTIC solution; Place 4 drops into the right ear 4 (four) times daily for 10 days.  Dispense: 10 mL; Refill: 0  Follow Up Instructions:  I discussed the assessment and treatment plan with the patient. The patient was provided an opportunity to ask questions and all were answered. The patient agreed with the plan and demonstrated an understanding of the instructions.   The patient was advised to call back or seek an in-person evaluation if the symptoms worsen or if the condition fails to improve as anticipated.  The above assessment and management plan was discussed with the patient. The patient verbalized understanding of and has agreed to the management plan. Patient is aware to call the clinic if symptoms persist or worsen. Patient is aware when to return to the clinic for a follow-up visit. Patient educated on when it is appropriate to go to the emergency department.   Time call ended: 9:04 AM  I provided 4 minutes of non-face-to-face time during this encounter.  Hendricks Limes, MSN, APRN, FNP-C [facility] 08/01/18

## 2018-08-01 NOTE — Addendum Note (Signed)
Addended by: Loman Brooklyn on: 08/01/2018 09:50 AM   Modules accepted: Orders

## 2018-08-07 DIAGNOSIS — M7742 Metatarsalgia, left foot: Secondary | ICD-10-CM | POA: Diagnosis not present

## 2018-08-07 DIAGNOSIS — L97412 Non-pressure chronic ulcer of right heel and midfoot with fat layer exposed: Secondary | ICD-10-CM | POA: Diagnosis not present

## 2018-08-07 DIAGNOSIS — E11621 Type 2 diabetes mellitus with foot ulcer: Secondary | ICD-10-CM | POA: Diagnosis not present

## 2018-08-09 DIAGNOSIS — R142 Eructation: Secondary | ICD-10-CM | POA: Diagnosis not present

## 2018-08-09 DIAGNOSIS — Z1211 Encounter for screening for malignant neoplasm of colon: Secondary | ICD-10-CM | POA: Diagnosis not present

## 2018-08-09 DIAGNOSIS — Z01818 Encounter for other preprocedural examination: Secondary | ICD-10-CM | POA: Diagnosis not present

## 2018-08-09 DIAGNOSIS — K635 Polyp of colon: Secondary | ICD-10-CM | POA: Diagnosis not present

## 2018-08-09 DIAGNOSIS — E119 Type 2 diabetes mellitus without complications: Secondary | ICD-10-CM | POA: Diagnosis not present

## 2018-08-09 DIAGNOSIS — K2270 Barrett's esophagus without dysplasia: Secondary | ICD-10-CM | POA: Diagnosis not present

## 2018-08-14 ENCOUNTER — Encounter: Payer: Self-pay | Admitting: Family Medicine

## 2018-08-14 DIAGNOSIS — K635 Polyp of colon: Secondary | ICD-10-CM | POA: Diagnosis not present

## 2018-08-15 DIAGNOSIS — K2270 Barrett's esophagus without dysplasia: Secondary | ICD-10-CM | POA: Diagnosis not present

## 2018-08-16 ENCOUNTER — Ambulatory Visit (INDEPENDENT_AMBULATORY_CARE_PROVIDER_SITE_OTHER): Payer: Medicare HMO | Admitting: Family Medicine

## 2018-08-16 ENCOUNTER — Encounter: Payer: Self-pay | Admitting: Family Medicine

## 2018-08-16 DIAGNOSIS — H1032 Unspecified acute conjunctivitis, left eye: Secondary | ICD-10-CM

## 2018-08-16 MED ORDER — POLYMYXIN B-TRIMETHOPRIM 10000-0.1 UNIT/ML-% OP SOLN: 2.0000 [drp] | Freq: Four times a day (QID) | OPHTHALMIC | 0 refills | Status: AC

## 2018-08-16 NOTE — Progress Notes (Signed)
Virtual Visit via Telephone Note  I connected with Timothy Bouche. on 08/16/18 at 10:06 AM by telephone and verified that I am speaking with the correct person using two identifiers. Timothy Bouche. is currently located at home and nobody is currently with him during this visit. The provider, Loman Brooklyn, FNP is located in their home at time of visit.  I discussed the limitations, risks, security and privacy concerns of performing an evaluation and management service by telephone and the availability of in person appointments. I also discussed with the patient that there may be a patient responsible charge related to this service. The patient expressed understanding and agreed to proceed.  Subjective: PCP: Dettinger, Fransisca Kaufmann, MD  Chief Complaint  Patient presents with  . Conjunctivitis   Patient reports he has contracted pink eye from his step-daughter. His left eye was red yesterday so he went and bought some OTC eye drops for the pain. He states it feels like gravel in his eye. This morning his eye was matted together and hard. The drainage is yellow in color.   ROS: Per HPI  Current Outpatient Medications:  .  CINNAMON PO, Take 1 capsule by mouth 2 (two) times daily., Disp: , Rfl:  .  empagliflozin (JARDIANCE) 25 MG TABS tablet, Take 25 mg by mouth daily., Disp: 90 tablet, Rfl: 3 .  fluticasone (FLONASE) 50 MCG/ACT nasal spray, PLACE 1 SPRAY INTO BOTH NOSTRILS 2 (TWO) TIMES DAILY AS NEEDED FOR ALLERGIES OR RHINITIS., Disp: 48 g, Rfl: 4 .  glipiZIDE (GLUCOTROL XL) 10 MG 24 hr tablet, Take 1 tablet (10 mg total) by mouth daily with breakfast., Disp: 90 tablet, Rfl: 3 .  glucose blood (ACCU-CHEK AVIVA) test strip, Use as instructed, Disp: 100 each, Rfl: 12 .  ketoconazole (NIZORAL) 2 % cream, Apply 1 application topically daily., Disp: 60 g, Rfl: 2 .  Lancet Devices (ACCU-CHEK SOFTCLIX) lancets, Use as instructed, Disp: 1 each, Rfl: 0 .  lisinopril (PRINIVIL,ZESTRIL) 20 MG tablet,  Take 1 tablet (20 mg total) by mouth daily., Disp: 90 tablet, Rfl: 3 .  metFORMIN (GLUCOPHAGE) 1000 MG tablet, Take 1 tablet (1,000 mg total) by mouth 2 (two) times daily with a meal., Disp: 180 tablet, Rfl: 3 .  Na Sulfate-K Sulfate-Mg Sulf 17.5-3.13-1.6 GM/177ML SOLN, Take 1 kit by mouth as directed., Disp: 354 mL, Rfl: 0 .  Omega-3 Fatty Acids (FISH OIL) 1200 MG CPDR, Take 1,200 mg by mouth daily., Disp: 90 capsule, Rfl: 1 .  omeprazole (PRILOSEC) 40 MG capsule, TAKE 1 CAPSULE BY MOUTH EVERY DAY, Disp: 90 capsule, Rfl: 1 .  Oxycodone HCl 10 MG TABS, Take 1 tablet (10 mg total) by mouth 4 (four) times daily as needed. Do not refill until 30 days from prescription date, Disp: 120 tablet, Rfl: 0 .  Oxycodone HCl 10 MG TABS, Take 1 tablet (10 mg total) by mouth 4 (four) times daily as needed. Do not refill until 60 days from prescription date, Disp: 120 tablet, Rfl: 0 .  Oxycodone HCl 10 MG TABS, Take 1 tablet (10 mg total) by mouth 4 (four) times daily as needed. Do not refill until 30 days from prescription date, Disp: 120 tablet, Rfl: 0 .  simvastatin (ZOCOR) 40 MG tablet, Take 1 tablet (40 mg total) by mouth daily at 6 PM., Disp: 90 tablet, Rfl: 3 .  triamcinolone cream (KENALOG) 0.1 %, Apply 1 application topically 2 (two) times daily., Disp: 454 g, Rfl: 1  Allergies  Allergen  Reactions  . Fenofibrate Rash    Permament rash.    Past Medical History:  Diagnosis Date  . Arthritis   . Barrett's esophagus   . Chronic pain   . Diabetes ([place])   . Essential hypertension   . Gastroparesis   . GERD (gastroesophageal reflux disease)   . Hyperlipidemia   . Obesity   . Type 2 diabetes mellitus (H[facility])     Observations/Objective: A&O  No respiratory distress or wheezing audible over the phone Mood, judgement, and thought processes all WNL  Assessment and Plan: 1. Acute bacterial conjunctivitis of left eye - Education provided on pink eye. Discussed frequent handwashing and not touching his  eyes with hands or bottle of eye drops.  - trimethoprim-polymyxin b (POLYTRIM) ophthalmic solution; Place 2 drops into the left eye every 6 (six) hours for 7 days.  Dispense: 10 mL; Refill: 0  Follow Up Instructions:  I discussed the assessment and treatment plan with the patient. The patient was provided an opportunity to ask questions and all were answered. The patient agreed with the plan and demonstrated an understanding of the instructions.   The patient was advised to call back or seek an in-person evaluation if the symptoms worsen or if the condition fails to improve as anticipated.  The above assessment and management plan was discussed with the patient. The patient verbalized understanding of and has agreed to the management plan. Patient is aware to call the clinic if symptoms persist or worsen. Patient is aware when to return to the clinic for a follow-up visit. Patient educated on when it is appropriate to go to the emergency department.   Time call ended: 10:09 AM  I provided 3 minutes of non-face-to-face time during this encounter.  Hendricks Limes, MSN, APRN, FNP-C [facility] 08/16/18

## 2018-08-16 NOTE — Patient Instructions (Signed)

## 2018-08-18 ENCOUNTER — Other Ambulatory Visit: Payer: Self-pay | Admitting: Family Medicine

## 2018-08-18 ENCOUNTER — Telehealth: Payer: Self-pay | Admitting: Family Medicine

## 2018-08-18 DIAGNOSIS — H109 Unspecified conjunctivitis: Secondary | ICD-10-CM | POA: Diagnosis not present

## 2018-08-18 MED ORDER — AMOXICILLIN-POT CLAVULANATE 875-125 MG PO TABS: 1.0000 | ORAL_TABLET | Freq: Two times a day (BID) | ORAL | 0 refills | Status: DC

## 2018-08-18 NOTE — Progress Notes (Signed)
Please let him know that I sent in Augmentin for him

## 2018-08-21 NOTE — Progress Notes (Signed)
Patient aware.

## 2018-08-24 DIAGNOSIS — B309 Viral conjunctivitis, unspecified: Secondary | ICD-10-CM | POA: Diagnosis not present

## 2018-08-30 DIAGNOSIS — D582 Other hemoglobinopathies: Secondary | ICD-10-CM | POA: Diagnosis not present

## 2018-08-30 DIAGNOSIS — B309 Viral conjunctivitis, unspecified: Secondary | ICD-10-CM | POA: Diagnosis not present

## 2018-08-30 DIAGNOSIS — D126 Benign neoplasm of colon, unspecified: Secondary | ICD-10-CM | POA: Diagnosis not present

## 2018-08-30 DIAGNOSIS — Z1159 Encounter for screening for other viral diseases: Secondary | ICD-10-CM | POA: Diagnosis not present

## 2018-08-30 DIAGNOSIS — Z79899 Other long term (current) drug therapy: Secondary | ICD-10-CM | POA: Diagnosis not present

## 2018-08-30 DIAGNOSIS — K2900 Acute gastritis without bleeding: Secondary | ICD-10-CM | POA: Diagnosis not present

## 2018-08-30 DIAGNOSIS — K635 Polyp of colon: Secondary | ICD-10-CM | POA: Diagnosis not present

## 2018-08-30 DIAGNOSIS — K22710 Barrett's esophagus with low grade dysplasia: Secondary | ICD-10-CM | POA: Diagnosis not present

## 2018-09-05 ENCOUNTER — Other Ambulatory Visit: Payer: Self-pay

## 2018-09-06 ENCOUNTER — Ambulatory Visit (INDEPENDENT_AMBULATORY_CARE_PROVIDER_SITE_OTHER): Payer: Medicare HMO | Admitting: Family Medicine

## 2018-09-06 ENCOUNTER — Encounter: Payer: Self-pay | Admitting: Family Medicine

## 2018-09-06 VITALS — BP 110/64 | HR 81 | Temp 96.9°F | Ht 73.0 in | Wt 264.4 lb

## 2018-09-06 DIAGNOSIS — L97412 Non-pressure chronic ulcer of right heel and midfoot with fat layer exposed: Secondary | ICD-10-CM | POA: Diagnosis not present

## 2018-09-06 DIAGNOSIS — E1159 Type 2 diabetes mellitus with other circulatory complications: Secondary | ICD-10-CM

## 2018-09-06 DIAGNOSIS — E11621 Type 2 diabetes mellitus with foot ulcer: Secondary | ICD-10-CM | POA: Diagnosis not present

## 2018-09-06 DIAGNOSIS — E1165 Type 2 diabetes mellitus with hyperglycemia: Secondary | ICD-10-CM

## 2018-09-06 DIAGNOSIS — E1169 Type 2 diabetes mellitus with other specified complication: Secondary | ICD-10-CM | POA: Diagnosis not present

## 2018-09-06 DIAGNOSIS — I10 Essential (primary) hypertension: Secondary | ICD-10-CM | POA: Diagnosis not present

## 2018-09-06 DIAGNOSIS — G8929 Other chronic pain: Secondary | ICD-10-CM | POA: Diagnosis not present

## 2018-09-06 DIAGNOSIS — E785 Hyperlipidemia, unspecified: Secondary | ICD-10-CM

## 2018-09-06 DIAGNOSIS — M79671 Pain in right foot: Secondary | ICD-10-CM | POA: Diagnosis not present

## 2018-09-06 DIAGNOSIS — I152 Hypertension secondary to endocrine disorders: Secondary | ICD-10-CM

## 2018-09-06 LAB — BAYER DCA HB A1C WAIVED: HB A1C (BAYER DCA - WAIVED): 7.9 % — ABNORMAL HIGH (ref <7.0)

## 2018-09-06 MED ORDER — OXYCODONE HCL 10 MG PO TABS: 10.0000 mg | ORAL_TABLET | Freq: Four times a day (QID) | ORAL | 0 refills | Status: DC | PRN

## 2018-09-06 NOTE — Progress Notes (Signed)
BP 110/64   Pulse 81   Temp (!) 96.9 F (36.1 C) (Temporal)   Ht 6' 1"  (1.854 m)   Wt 264 lb 6.4 oz (119.9 kg)   BMI 34.88 kg/m    Subjective:   Patient ID: Timothy Nelson., male    DOB: March 10, 1965, 53 y.o.   MRN: [id number]  HPI: Timothy Nelson. is a 53 y.o. 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 Pain location-bilateral ankles Pain on scale of 1-10-8 Frequency-Daily What increases pain-being on his feet and walking for long periods What makes pain Better-medication and he sees podiatry and has better inserts Effects on ADL -limits him having a job possibility because of being on his feet Any change in general medical condition-none  Current opioids rx-oxycodone 10 4 times daily as needed # meds rx-120 Effectiveness of current meds-working well currently Adverse reactions form pain meds-none Morphine equivalent-60  Pill count performed-No Last drug screen -03/09/2018 ( high risk q108m moderate risk q642mlow risk yearly ) Urine drug screen today- No [place]  If yes were their any concerning findings? -None  No flowsheet data found.   Pain contract signed on: 03/09/2018  Type 2 diabetes mellitus Patient comes in today for recheck of his diabetes. Patient has been currently taking glipizide and metformin and Jardiance. Patient is currently on an ACE inhibitor/ARB. Patient has not seen an ophthalmologist this year. Patient denies any new issues with their feet.  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 Patient denies any lightheadedness or dizziness. Patient denies headaches, blurred vision,  chest pains, shortness of breath, or weakness. Denies any side effects from medication and is content with current medication.   Hyperlipidemia Patient is coming in for recheck of his hyperlipidemia. The patient is currently taking fish oils and simvastatin. They deny any issues with myalgias or history of liver damage from it. They deny any focal numbness or weakness or chest pain.   Relevant past medical, surgical, family and social history reviewed and updated as indicated. Interim medical history since our last visit reviewed. Allergies and medications reviewed and updated.  Review of Systems  Constitutional: Negative for chills and fever.  Respiratory: Negative for shortness of breath and wheezing.   Cardiovascular: Negative for chest pain and leg swelling.  Musculoskeletal: Positive for arthralgias. Negative for back pain and gait problem.  Skin: Negative for rash.  Neurological: Positive for numbness. Negative for dizziness, weakness and light-headedness.  All other systems reviewed and are negative.   Per HPI unless specifically indicated above   Allergies as of 09/06/2018      Reactions   Fenofibrate Rash   Permament rash.      Medication List       Accurate as of September 06, 2018 11:59 PM. If you have any questions, ask your nurse or doctor.        STOP taking these medications   amoxicillin 500 MG capsule Commonly known as: AMOXIL Stopped by: JoWorthy RancherMD   amoxicillin-clavulanate 875-125 MG tablet Commonly known as: AUGMENTIN Stopped by: JoWorthy RancherMD     TAKE these medications  accu-chek softclix lancets Use as instructed   CINNAMON PO Take 1 capsule by mouth 2 (two) times daily.   empagliflozin 25 MG Tabs tablet Commonly known as: Jardiance Take 25 mg by mouth daily.   Fish Oil 1200 MG Cpdr Take 1,200 mg by mouth daily.   fluticasone 50 MCG/ACT nasal spray Commonly known as: FLONASE PLACE 1 SPRAY INTO BOTH NOSTRILS 2 (TWO)  TIMES DAILY AS NEEDED FOR ALLERGIES OR RHINITIS.   glipiZIDE 10 MG 24 hr tablet Commonly known as: GLUCOTROL XL Take 1 tablet (10 mg total) by mouth daily with breakfast.   glucose blood test strip Commonly known as: Accu-Chek Aviva Use as instructed   ketoconazole 2 % cream Commonly known as: NIZORAL Apply 1 application topically daily.   lisinopril 20 MG tablet Commonly known as: ZESTRIL Take 1 tablet (20 mg total) by mouth daily.   metFORMIN 1000 MG tablet Commonly known as: GLUCOPHAGE Take 1 tablet (1,000 mg total) by mouth 2 (two) times daily with a meal.   Na Sulfate-K Sulfate-Mg Sulf 17.5-3.13-1.6 GM/177ML Soln Take 1 kit by mouth as directed.   omeprazole 40 MG capsule Commonly known as: PRILOSEC TAKE 1 CAPSULE BY MOUTH EVERY DAY   Oxycodone HCl 10 MG Tabs Take 1 tablet (10 mg total) by mouth 4 (four) times daily as needed. Do not refill until 30 days from prescription date What changed: Another medication with the same name was changed. Make sure you understand how and when to take each. Changed by: Fransisca Kaufmann , MD   Oxycodone HCl 10 MG Tabs Take 1 tablet (10 mg total) by mouth 4 (four) times daily as needed. What changed: additional instructions Changed by: Fransisca Kaufmann , MD   Oxycodone HCl 10 MG Tabs Take 1 tablet (10 mg total) by mouth 4 (four) times daily as needed. Do not refill until 60 days from prescription date What changed: Another medication with the same name was changed. Make sure you understand how and when to take each. Changed by: Worthy Rancher, MD   simvastatin 40 MG tablet Commonly known as: ZOCOR Take 1 tablet (40 mg total) by mouth daily at 6 PM.   triamcinolone cream 0.1 % Commonly known as: KENALOG Apply 1 application topically 2 (two) times daily.        Objective:   BP 110/64   Pulse 81   Temp (!) 96.9 F (36.1 C) (Temporal)   Ht 6' 1"  (1.854 m)   Wt 264 lb 6.4 oz (119.9 kg)   BMI 34.88 kg/m   Wt  Readings from Last 3 Encounters:  09/06/18 264 lb 6.4 oz (119.9 kg)  06/07/18 259 lb 3.2 oz (117.6 kg)  03/09/18 258 lb 12.8 oz (117.4 kg)    Physical Exam Vitals signs and nursing note reviewed.  Constitutional:      General: He is not in acute distress.    Appearance: He is well-developed. He is not diaphoretic.  Eyes:     General: No scleral icterus.    Conjunctiva/sclera: Conjunctivae normal.  Neck:     Musculoskeletal: Neck supple.     Thyroid: No thyromegaly.  Cardiovascular:     Rate and Rhythm: Normal rate and regular rhythm.     Heart sounds: Normal heart sounds. No murmur.  Pulmonary:     Effort: Pulmonary effort is normal. No respiratory distress.     Breath sounds: Normal breath sounds. No wheezing.  Lymphadenopathy:     Cervical: No cervical adenopathy.  Skin:  General: Skin is warm and dry.     Findings: No rash.  Neurological:     Mental Status: He is alert and oriented to person, place, and time.     Coordination: Coordination normal.  Psychiatric:        Behavior: Behavior normal.     Results for orders placed or performed in visit on 09/06/18  [facility] Hb A1c Waived  Result Value Ref Range   HB A1C ([facility] DCA - WAIVED) 7.9 (H) <7.0 %    Assessment & Plan:   Problem List Items Addressed This Visit      Cardiovascular and Mediastinum   Hypertension associated with diabetes ([place]) - Primary     Endocrine   Diabetes type 2, uncontrolled ([place])   Relevant Orders   [facility] DCA Hb A1c Waived (Completed)   Hyperlipidemia associated with type 2 diabetes mellitus ([place])   Diabetic ulcer of right midfoot associated with type 2 diabetes mellitus, with fat layer exposed ([place])   Relevant Orders   Bayer DCA Hb A1c Waived (Completed)   Diabetes mellitus, type 2 ([facility])   Relevant Orders   Bayer DCA Hb A1c Waived (Completed)     Other   Chronic foot pain   Relevant Medications   Oxycodone HCl 10 MG TABS   Oxycodone HCl 10 MG TABS   Oxycodone HCl 10 MG  TABS      Continue medication for foot pain and for diabetes, focus on dietary and lifestyle and will consider endocrinology. Follow up plan: Return in about 3 months (around 12/06/2018), or if symptoms worsen or fail to improve, for Diabetes.  Counseling provided for all of the vaccine components Orders Placed This Encounter  Procedures  . [facility] Hb A1c Waived    Caryl Pina, MD [facility] 09/12/2018, 10:07 PM

## 2018-09-07 ENCOUNTER — Encounter: Payer: Self-pay | Admitting: Family Medicine

## 2018-09-07 DIAGNOSIS — E1165 Type 2 diabetes mellitus with hyperglycemia: Secondary | ICD-10-CM

## 2018-09-20 DIAGNOSIS — B309 Viral conjunctivitis, unspecified: Secondary | ICD-10-CM | POA: Diagnosis not present

## 2018-10-03 ENCOUNTER — Ambulatory Visit (INDEPENDENT_AMBULATORY_CARE_PROVIDER_SITE_OTHER): Payer: Medicare HMO | Admitting: "Endocrinology

## 2018-10-03 ENCOUNTER — Encounter: Payer: Self-pay | Admitting: "Endocrinology

## 2018-10-03 ENCOUNTER — Other Ambulatory Visit: Payer: Self-pay

## 2018-10-03 VITALS — BP 126/84 | HR 83 | Ht 71.0 in | Wt 263.0 lb

## 2018-10-03 DIAGNOSIS — E782 Mixed hyperlipidemia: Secondary | ICD-10-CM

## 2018-10-03 DIAGNOSIS — I10 Essential (primary) hypertension: Secondary | ICD-10-CM | POA: Insufficient documentation

## 2018-10-03 DIAGNOSIS — E1165 Type 2 diabetes mellitus with hyperglycemia: Secondary | ICD-10-CM | POA: Diagnosis not present

## 2018-10-03 NOTE — Patient Instructions (Signed)

## 2018-10-03 NOTE — Progress Notes (Signed)
Endocrinology Consult Note       10/03/2018, 4:59 PM   Subjective:    Patient ID: Timothy Zeitler., male    DOB: 1965/06/25.  Timothy Bouche. is being seen in consultation for management of currently uncontrolled symptomatic diabetes requested by  Dettinger, Fransisca Kaufmann, MD.   Past Medical History:  Diagnosis Date  . Arthritis   . Barrett's esophagus   . Chronic pain   . Diabetes ([place])   . Essential hypertension   . Gastroparesis   . GERD (gastroesophageal reflux disease)   . Hyperlipidemia   . Obesity   . Type 2 diabetes mellitus ([place])     Past Surgical History:  Procedure Laterality Date  . ESOPHAGOGASTRODUODENOSCOPY (EGD) WITH PROPOFOL N/A 04/04/2015   Procedure: ESOPHAGOGASTRODUODENOSCOPY (EGD) WITH PROPOFOL;  Surgeon: Rogene Houston, MD;  Location: [facility];  Service: Endoscopy;  Laterality: N/A;  8:30  . FRACTURE SURGERY  March 2007   Left arm, left thumb, bilateral legs  . HIP SURGERY Right   . WRIST FRACTURE SURGERY Left     Social History   Socioeconomic History  . Marital status: Married    Spouse name: Not on file  . Number of children: 1  . Years of education: Not on file  . Highest education level: Associate degree: occupational, Hotel manager, or vocational program  Occupational History  . Occupation: Disability  Social Needs  . Financial resource strain: Not hard at all  . Food insecurity    Worry: Never true    Inability: Never true  . Transportation needs    Medical: No    Non-medical: No  Tobacco Use  . Smoking status: Current Some Day Smoker    Packs/day: 1.00    Years: 20.00    Pack years: 20.00    Types: Cigars  . Smokeless tobacco: Never Used  . Tobacco comment: Smoked intermittently for 20 years; 1-2 cigars weekly as of 24/2020  Substance and Sexual Activity  . Alcohol use: Yes    Alcohol/week: 1.0 standard drinks    Types: 1 Glasses of wine per week     Comment: glass wine a week.   . Drug use: No  . Sexual activity: Yes  Lifestyle  . Physical activity    Days per week: 0 days    Minutes per session: 0 min  . Stress: Not at all  Relationships  . Social connections    Talks on phone: More than three times a week    Gets together: More than three times a week    Attends religious service: Never    Active member of club or organization: Yes    Attends meetings of clubs or organizations: More than 4 times per year    Relationship status: Married  Other Topics Concern  . Not on file  Social History Narrative  . Not on file    Family History  Problem Relation Age of Onset  . Diabetes Mother   . Hypertension Mother   . Hyperlipidemia Mother   . Arthritis Mother   . Esophageal cancer Mother        patient unsure  .  Diabetes Father   . Heart disease Father        Age 34  . Kidney disease Father   . Hypertension Sister   . Diabetes Daughter   . Colon cancer Neg Hx   . Stomach cancer Neg Hx   . Pancreatic cancer Neg Hx   . Liver disease Neg Hx     Outpatient Encounter Medications as of 10/03/2018  Medication Sig  . CINNAMON PO Take 1 capsule by mouth 2 (two) times daily.  . fluticasone (FLONASE) 50 MCG/ACT nasal spray PLACE 1 SPRAY INTO BOTH NOSTRILS 2 (TWO) TIMES DAILY AS NEEDED FOR ALLERGIES OR RHINITIS.  Marland Kitchen glipiZIDE (GLUCOTROL XL) 10 MG 24 hr tablet Take 1 tablet (10 mg total) by mouth daily with breakfast.  . glucose blood (ACCU-CHEK AVIVA) test strip Use as instructed  . ketoconazole (NIZORAL) 2 % cream Apply 1 application topically daily.  Elmore Guise Devices (ACCU-CHEK SOFTCLIX) lancets Use as instructed  . lisinopril (PRINIVIL,ZESTRIL) 20 MG tablet Take 1 tablet (20 mg total) by mouth daily.  . metFORMIN (GLUCOPHAGE) 1000 MG tablet Take 1 tablet (1,000 mg total) by mouth 2 (two) times daily with a meal.  . Na Sulfate-K Sulfate-Mg Sulf 17.5-3.13-1.6 GM/177ML SOLN Take 1 kit by mouth as directed.  . Omega-3 Fatty Acids  (FISH OIL) 1200 MG CPDR Take 1,200 mg by mouth daily.  Marland Kitchen omeprazole (PRILOSEC) 40 MG capsule TAKE 1 CAPSULE BY MOUTH EVERY DAY  . Oxycodone HCl 10 MG TABS Take 1 tablet (10 mg total) by mouth 4 (four) times daily as needed. Do not refill until 30 days from prescription date  . Oxycodone HCl 10 MG TABS Take 1 tablet (10 mg total) by mouth 4 (four) times daily as needed.  . Oxycodone HCl 10 MG TABS Take 1 tablet (10 mg total) by mouth 4 (four) times daily as needed. Do not refill until 60 days from prescription date  . simvastatin (ZOCOR) 40 MG tablet Take 1 tablet (40 mg total) by mouth daily at 6 PM.  . triamcinolone cream (KENALOG) 0.1 % Apply 1 application topically 2 (two) times daily.  . [DISCONTINUED] empagliflozin (JARDIANCE) 25 MG TABS tablet Take 25 mg by mouth daily.   No facility-administered encounter medications on file as of 10/03/2018.     ALLERGIES: Allergies  Allergen Reactions  . Fenofibrate Rash    Permament rash.     VACCINATION STATUS: Immunization History  Administered Date(s) Administered  . Influenza-Unspecified 09/04/2016  . Pneumococcal Polysaccharide-23 09/16/2016    Diabetes He presents for his initial diabetic visit. He has type 2 diabetes mellitus. Onset time: He was diagnosed at approximate age of 61 years. His disease course has been fluctuating. There are no hypoglycemic associated symptoms. Pertinent negatives for hypoglycemia include no confusion, headaches, pallor or seizures. Associated symptoms include polydipsia and polyuria. Pertinent negatives for diabetes include no chest pain, no fatigue, no polyphagia and no weakness. There are no hypoglycemic complications. Symptoms are improving. There are no diabetic complications. Risk factors for coronary artery disease include diabetes mellitus, dyslipidemia, family history, obesity, male sex, hypertension, sedentary lifestyle and tobacco exposure. Current diabetic treatment includes oral agent (triple  therapy). His weight is increasing steadily. He is following a generally unhealthy diet. When asked about meal planning, he reported none. He has not had a previous visit with a dietitian. He rarely participates in exercise. His home blood glucose trend is decreasing steadily. (He did not bring any logs nor meter to review.  His most  recent A1c was 7.9% improving. ) An ACE inhibitor/angiotensin II receptor blocker is being taken.  Hyperlipidemia This is a chronic problem. The current episode started more than 1 year ago. The problem is uncontrolled. Recent lipid tests were reviewed and are high. Exacerbating diseases include diabetes and obesity. Pertinent negatives include no chest pain, myalgias or shortness of breath. Current antihyperlipidemic treatment includes statins. Risk factors for coronary artery disease include dyslipidemia, diabetes mellitus, family history, hypertension, male sex, obesity and a sedentary lifestyle.  Hypertension This is a chronic problem. The current episode started more than 1 year ago. Pertinent negatives include no chest pain, headaches, neck pain, palpitations or shortness of breath. Risk factors for coronary artery disease include diabetes mellitus, dyslipidemia, male gender, obesity, sedentary lifestyle, family history and smoking/tobacco exposure. Past treatments include ACE inhibitors.     Review of Systems  Constitutional: Negative for chills, fatigue, fever and unexpected weight change.  HENT: Negative for dental problem, mouth sores and trouble swallowing.   Eyes: Negative for visual disturbance.  Respiratory: Negative for cough, choking, chest tightness, shortness of breath and wheezing.   Cardiovascular: Negative for chest pain, palpitations and leg swelling.  Gastrointestinal: Negative for abdominal distention, abdominal pain, constipation, diarrhea, nausea and vomiting.  Endocrine: Positive for polydipsia and polyuria. Negative for polyphagia.   Genitourinary: Negative for dysuria, flank pain, hematuria and urgency.  Musculoskeletal: Negative for back pain, gait problem, myalgias and neck pain.  Skin: Negative for pallor, rash and wound.  Neurological: Negative for seizures, syncope, weakness, numbness and headaches.  Psychiatric/Behavioral: Negative for confusion and dysphoric mood.    Objective:    BP 126/84   Pulse 83   Ht 5' 11" (1.803 m)   Wt 263 lb (119.3 kg)   BMI 36.68 kg/m   Wt Readings from Last 3 Encounters:  10/03/18 263 lb (119.3 kg)  09/06/18 264 lb 6.4 oz (119.9 kg)  06/07/18 259 lb 3.2 oz (117.6 kg)     Physical Exam Constitutional:      General: He is not in acute distress.    Appearance: He is well-developed.  HENT:     Head: Normocephalic and atraumatic.  Neck:     Musculoskeletal: Normal range of motion and neck supple.     Thyroid: No thyromegaly.     Trachea: No tracheal deviation.  Cardiovascular:     Rate and Rhythm: Normal rate.     Pulses:          Dorsalis pedis pulses are 1+ on the right side and 1+ on the left side.       Posterior tibial pulses are 1+ on the right side and 1+ on the left side.     Heart sounds: S1 normal and S2 normal. No murmur. No gallop.   Pulmonary:     Effort: Pulmonary effort is normal. No respiratory distress.     Breath sounds: No wheezing.  Abdominal:     General: Bowel sounds are normal. There is no distension.     Tenderness: There is no abdominal tenderness. There is no guarding.  Musculoskeletal:     Right shoulder: He exhibits no swelling and no deformity.  Skin:    General: Skin is warm and dry.     Findings: No rash.     Nails: There is no clubbing.   Neurological:     Mental Status: He is alert and oriented to person, place, and time.     Cranial Nerves: No cranial nerve deficit.  Sensory: No sensory deficit.     Gait: Gait normal.     Deep Tendon Reflexes: Reflexes are normal and symmetric.  Psychiatric:        Speech: Speech  normal.        Behavior: Behavior is cooperative.        Thought Content: Thought content normal.        Judgment: Judgment normal.     Comments: Reluctant affect.     CMP ( most recent) CMP     Component Value Date/Time   NA 140 06/07/2018 1332   K 4.4 06/07/2018 1332   CL 100 06/07/2018 1332   CO2 19 (L) 06/07/2018 1332   GLUCOSE 131 (H) 06/07/2018 1332   GLUCOSE 208 (H) 04/03/2015 1500   BUN 18 06/07/2018 1332   CREATININE 0.94 06/07/2018 1332   CALCIUM 9.6 06/07/2018 1332   PROT 7.0 06/07/2018 1332   ALBUMIN 4.3 06/07/2018 1332   AST 20 06/07/2018 1332   ALT 31 06/07/2018 1332   ALKPHOS 110 06/07/2018 1332   BILITOT 0.3 06/07/2018 1332   GFRNONAA 93 06/07/2018 1332   GFRAA 107 06/07/2018 1332   Diabetic Labs (most recent): Lab Results  Component Value Date   HGBA1C 7.9 (H) 09/06/2018   HGBA1C 8.7 (H) 06/07/2018   HGBA1C 8.4 (H) 03/09/2018     Lipid Panel ( most recent) Lipid Panel     Component Value Date/Time   CHOL 137 06/07/2018 1332   TRIG 348 (H) 06/07/2018 1332   HDL 34 (L) 06/07/2018 1332   CHOLHDL 4.0 06/07/2018 1332   LDLCALC 33 06/07/2018 1332   LABVLDL 70 (H) 06/07/2018 1332     Lab Results  Component Value Date   TSH 2.13 02/07/2018   TSH 3.510 09/16/2016   TSH 1.270 09/11/2014     Assessment & Plan:   1. Uncontrolled type 2 diabetes mellitus with hyperglycemia ([facility])  - Timothy Bouche. has currently uncontrolled symptomatic type 2 DM since  53 years of age,  with most recent A1c of 7.9 % improving from 8.7%. Recent labs reviewed. - I had a long discussion with him about the progressive nature of diabetes and the pathology behind its complications. -his diabetes is complicated by obesity/sedentary life, history of smoking and he remains at a high risk for more acute and chronic complications which include CAD, CVA, CKD, retinopathy, and neuropathy. These are all discussed in detail with him.  - I have counseled him on diet  and weight  management  by adopting a carbohydrate restricted/protein rich diet. Patient is encouraged to switch to  unprocessed or minimally processed     complex starch and increased protein intake (animal or plant source), fruits, and vegetables. -  he is advised to stick to a routine mealtimes to eat 3 meals  a day and avoid unnecessary snacks ( to snack only to correct hypoglycemia).   - he admits that there is a room for improvement in his food and drink choices. - Suggestion is made for him to avoid simple carbohydrates  from his diet including Cakes, Sweet Desserts, Ice Cream, Soda (diet and regular), Sweet Tea, Candies, Chips, Cookies, Store Bought Juices, Alcohol in Excess of  1-2 drinks a day, Artificial Sweeteners,  Coffee Creamer, and "Sugar-free" Products. This will help patient to have more stable blood glucose profile and potentially avoid unintended weight gain.  - he will be scheduled with Jearld Fenton, RDN, CDE for diabetes education.  - I have approached him  with the following individualized plan to manage  his diabetes and patient agrees:   -Based on his current glycemic response, he will not need insulin treatment for now.  He is approached to start monitoring blood glucose  4 times a day-before meals and at bedtime.  - Adjustment parameters are given to him for hypo and hyperglycemia in writing. - he is encouraged to call clinic for blood glucose levels less than 70 or above 300 mg /dl. - he is advised to continue metformin 1000 mg p.o. daily, therapeutically suitable for patient . -He is also advised to continue glipizide 10 mg XL p.o. daily at breakfast. - his Vania Rea will be discontinued, risk outweighs benefit for this patient.  - he will be considered for incretin therapy as appropriate next visit.  - Specific targets for  A1c;  LDL, HDL, Triglycerides, and  Waist Circumference were discussed with the patient.  2) Blood Pressure /Hypertension:  his blood pressure is   controlled to target.   he is advised to continue his current medications including lisinopril 20 mg p.o. daily with breakfast . 3) Lipids/Hyperlipidemia:   Review of his recent lipid panel showed  controlled  LDL at 70, however uncontrolled hypertriglyceridemia at 148.  he  is advised to continue    simvastatin 40 mg daily at bedtime.  Side effects and precautions discussed with him.  He has documented intolerance to fenofibrate causing rash.  He is advised to cut back on butter and fried food consumption.  4)  Weight/Diet:  Body mass index is 36.68 kg/m.  -   clearly complicating his diabetes care.   he is  a candidate for weight loss. I discussed with him the fact that loss of 5 - 10% of his  current body weight will have the most impact on his diabetes management.  Exercise, and detailed carbohydrates information provided  -  detailed on discharge instructions.  5) Chronic Care/Health Maintenance:  -he  is on ACEI/ARB and Statin medications and  is encouraged to initiate and continue to follow up with Ophthalmology, Dentist,  Podiatrist at least yearly or according to recommendations, and advised to   stay away from smoking.  He is safe he does not believe in flu shots,  I have recommended yearly flu vaccine and pneumonia vaccine at least every 5 years; moderate intensity exercise for up to 150 minutes weekly; and  sleep for at least 7 hours a day.  - he is  advised to maintain close follow up with Dettinger, Fransisca Kaufmann, MD for primary care needs, as well as his other providers for optimal and coordinated care.  - Time spent with the patient: 60 minutes, of which >50% was spent in obtaining information about his symptoms, reviewing his previous labs/studies, evaluations, and treatments, counseling him about his currently uncontrolled type 2 diabetes, hyperlipidemia, hypertension, and developing plans for long term treatment based on the latest standards of care/guidelines.  Please refer to " Patient  Self Inventory" in the Media  tab for reviewed elements of pertinent patient history.  Timothy Bouche. participated in the discussions, expressed understanding, and voiced agreement with the above plans.  All questions were answered to his satisfaction. he is encouraged to contact clinic should he have any questions or concerns prior to his return visit.  Follow up plan: - Return in about 10 days (around 10/13/2018) for Follow up with Meter and Logs Only - no Labs.  Glade Lloyd, MD [facility]  [address] [place], [place] [address] Phone: [phone]  Fax: [phone]    10/03/2018, 4:59 PM  This note was partially dictated with voice recognition software. Similar sounding words can be transcribed inadequately or may not  be corrected upon review.

## 2018-10-09 DIAGNOSIS — L602 Onychogryphosis: Secondary | ICD-10-CM | POA: Diagnosis not present

## 2018-10-09 DIAGNOSIS — E1142 Type 2 diabetes mellitus with diabetic polyneuropathy: Secondary | ICD-10-CM | POA: Diagnosis not present

## 2018-10-09 DIAGNOSIS — L859 Epidermal thickening, unspecified: Secondary | ICD-10-CM | POA: Diagnosis not present

## 2018-10-16 ENCOUNTER — Other Ambulatory Visit: Payer: Self-pay

## 2018-10-16 ENCOUNTER — Ambulatory Visit (INDEPENDENT_AMBULATORY_CARE_PROVIDER_SITE_OTHER): Payer: Medicare HMO | Admitting: "Endocrinology

## 2018-10-16 ENCOUNTER — Encounter: Payer: Self-pay | Admitting: "Endocrinology

## 2018-10-16 DIAGNOSIS — E782 Mixed hyperlipidemia: Secondary | ICD-10-CM

## 2018-10-16 DIAGNOSIS — I10 Essential (primary) hypertension: Secondary | ICD-10-CM | POA: Diagnosis not present

## 2018-10-16 DIAGNOSIS — E1165 Type 2 diabetes mellitus with hyperglycemia: Secondary | ICD-10-CM | POA: Diagnosis not present

## 2018-10-16 NOTE — Progress Notes (Signed)
10/16/2018, 1:29 PM                                                    Endocrinology Telehealth Visit Follow up Note -During COVID -19 Pandemic  This visit type was conducted due to national recommendations for restrictions regarding the COVID-19 Pandemic  in an effort to limit this patient's exposure and mitigate transmission of the corona virus.  Due to his co-morbid illnesses, Timothy Brunty. is at  moderate to high risk for complications without adequate follow up.  This format is felt to be most appropriate for him at this time.  I connected with this patient on 10/16/2018   by telephone and verified that I am speaking with the correct person using two identifiers. Timothy Hersh., [birth year]. he has verbally consented to this visit. All issues noted in this document were discussed and addressed. The format was not optimal for physical exam.    Subjective:    Patient ID: Timothy Cartmell., male    DOB: Nov 02, 1965.  Timothy Bouche. is being engaged in telehealth via telephone for follow-up after he was seen in consultation for management of currently uncontrolled symptomatic diabetes requested by  Timothy Nelson, Timothy Kaufmann, Timothy Nelson.   Past Medical History:  Diagnosis Date  . Arthritis   . Barrett's esophagus   . Chronic pain   . Diabetes ([place])   . Essential hypertension   . Gastroparesis   . GERD (gastroesophageal reflux disease)   . Hyperlipidemia   . Obesity   . Type 2 diabetes mellitus ([place])     Past Surgical History:  Procedure Laterality Date  . ESOPHAGOGASTRODUODENOSCOPY (EGD) WITH PROPOFOL N/A 04/04/2015   Procedure: ESOPHAGOGASTRODUODENOSCOPY (EGD) WITH PROPOFOL;  Surgeon: Rogene Houston, Timothy Nelson;  Location: [facility];  Service: Endoscopy;  Laterality: N/A;  8:30  . FRACTURE SURGERY  March 2007   Left arm, left thumb, bilateral legs  . HIP SURGERY Right   . WRIST FRACTURE SURGERY Left     Social History    Socioeconomic History  . Marital status: Married    Spouse name: Not on file  . Number of children: 1  . Years of education: Not on file  . Highest education level: Associate degree: occupational, Hotel manager, or vocational program  Occupational History  . Occupation: Disability  Social Needs  . Financial resource strain: Not hard at all  . Food insecurity    Worry: Never true    Inability: Never true  . Transportation needs    Medical: No    Non-medical: No  Tobacco Use  . Smoking status: Current Some Day Smoker    Packs/day: 1.00    Years: 20.00    Pack years: 20.00    Types: Cigars  . Smokeless tobacco: Never Used  . Tobacco comment: Smoked intermittently for 20 years; 1-2 cigars weekly as of 24/2020  Substance and Sexual Activity  . Alcohol use: Yes    Alcohol/week: 1.0 standard drinks    Types: 1 Glasses of wine per week  Comment: glass wine a week.   . Drug use: No  . Sexual activity: Yes  Lifestyle  . Physical activity    Days per week: 0 days    Minutes per session: 0 min  . Stress: Not at all  Relationships  . Social connections    Talks on phone: More than three times a week    Gets together: More than three times a week    Attends religious service: Never    Active member of club or organization: Yes    Attends meetings of clubs or organizations: More than 4 times per year    Relationship status: Married  Other Topics Concern  . Not on file  Social History Narrative  . Not on file    Family History  Problem Relation Age of Onset  . Diabetes Mother   . Hypertension Mother   . Hyperlipidemia Mother   . Arthritis Mother   . Esophageal cancer Mother        patient unsure  . Diabetes Father   . Heart disease Father        Age 61  . Kidney disease Father   . Hypertension Sister   . Diabetes Daughter   . Colon cancer Neg Hx   . Stomach cancer Neg Hx   . Pancreatic cancer Neg Hx   . Liver disease Neg Hx     Outpatient Encounter Medications  as of 10/16/2018  Medication Sig  . CINNAMON PO Take 1 capsule by mouth 2 (two) times daily.  . fluticasone (FLONASE) 50 MCG/ACT nasal spray PLACE 1 SPRAY INTO BOTH NOSTRILS 2 (TWO) TIMES DAILY AS NEEDED FOR ALLERGIES OR RHINITIS.  Marland Kitchen glipiZIDE (GLUCOTROL XL) 10 MG 24 hr tablet Take 1 tablet (10 mg total) by mouth daily with breakfast.  . glucose blood (ACCU-CHEK AVIVA) test strip Use as instructed  . ketoconazole (NIZORAL) 2 % cream Apply 1 application topically daily.  Elmore Guise Devices (ACCU-CHEK SOFTCLIX) lancets Use as instructed  . lisinopril (PRINIVIL,ZESTRIL) 20 MG tablet Take 1 tablet (20 mg total) by mouth daily.  . metFORMIN (GLUCOPHAGE) 1000 MG tablet Take 1 tablet (1,000 mg total) by mouth 2 (two) times daily with a meal.  . Na Sulfate-K Sulfate-Mg Sulf 17.5-3.13-1.6 GM/177ML SOLN Take 1 kit by mouth as directed.  . Omega-3 Fatty Acids (FISH OIL) 1200 MG CPDR Take 1,200 mg by mouth daily.  Marland Kitchen omeprazole (PRILOSEC) 40 MG capsule TAKE 1 CAPSULE BY MOUTH EVERY DAY  . Oxycodone HCl 10 MG TABS Take 1 tablet (10 mg total) by mouth 4 (four) times daily as needed. Do not refill until 30 days from prescription date  . Oxycodone HCl 10 MG TABS Take 1 tablet (10 mg total) by mouth 4 (four) times daily as needed.  . Oxycodone HCl 10 MG TABS Take 1 tablet (10 mg total) by mouth 4 (four) times daily as needed. Do not refill until 60 days from prescription date  . simvastatin (ZOCOR) 40 MG tablet Take 1 tablet (40 mg total) by mouth daily at 6 PM.  . triamcinolone cream (KENALOG) 0.1 % Apply 1 application topically 2 (two) times daily.   No facility-administered encounter medications on file as of 10/16/2018.     ALLERGIES: Allergies  Allergen Reactions  . Fenofibrate Rash    Permament rash.     VACCINATION STATUS: Immunization History  Administered Date(s) Administered  . Influenza-Unspecified 09/04/2016  . Pneumococcal Polysaccharide-23 09/16/2016    Diabetes He presents for his  follow-up  diabetic visit. He has type 2 diabetes mellitus. Onset time: He was diagnosed at approximate age of 57 years. His disease course has been improving. There are no hypoglycemic associated symptoms. Pertinent negatives for hypoglycemia include no confusion, headaches, pallor or seizures. Pertinent negatives for diabetes include no chest pain, no fatigue, no polydipsia, no polyphagia, no polyuria and no weakness. There are no hypoglycemic complications. Symptoms are improving. There are no diabetic complications. Risk factors for coronary artery disease include diabetes mellitus, dyslipidemia, family history, obesity, male sex, hypertension, sedentary lifestyle and tobacco exposure. Current diabetic treatment includes oral agent (triple therapy). He is following a generally unhealthy diet. When asked about meal planning, he reported none. He has not had a previous visit with a dietitian. He rarely participates in exercise. His home blood glucose trend is decreasing steadily. An ACE inhibitor/angiotensin II receptor blocker is being taken.  Hyperlipidemia This is a chronic problem. The current episode started more than 1 year ago. The problem is uncontrolled. Recent lipid tests were reviewed and are high. Exacerbating diseases include diabetes and obesity. Pertinent negatives include no chest pain, myalgias or shortness of breath. Current antihyperlipidemic treatment includes statins. Risk factors for coronary artery disease include dyslipidemia, diabetes mellitus, family history, hypertension, male sex, obesity and a sedentary lifestyle.  Hypertension This is a chronic problem. The current episode started more than 1 year ago. Pertinent negatives include no chest pain, headaches, neck pain, palpitations or shortness of breath. Risk factors for coronary artery disease include diabetes mellitus, dyslipidemia, male gender, obesity, sedentary lifestyle, family history and smoking/tobacco exposure. Past  treatments include ACE inhibitors.    Review of systems: Limited as above.  Objective:    There were no vitals taken for this visit.  Wt Readings from Last 3 Encounters:  10/03/18 263 lb (119.3 kg)  09/06/18 264 lb 6.4 oz (119.9 kg)  06/07/18 259 lb 3.2 oz (117.6 kg)      CMP ( most recent) CMP     Component Value Date/Time   NA 140 06/07/2018 1332   K 4.4 06/07/2018 1332   CL 100 06/07/2018 1332   CO2 19 (L) 06/07/2018 1332   GLUCOSE 131 (H) 06/07/2018 1332   GLUCOSE 208 (H) 04/03/2015 1500   BUN 18 06/07/2018 1332   CREATININE 0.94 06/07/2018 1332   CALCIUM 9.6 06/07/2018 1332   PROT 7.0 06/07/2018 1332   ALBUMIN 4.3 06/07/2018 1332   AST 20 06/07/2018 1332   ALT 31 06/07/2018 1332   ALKPHOS 110 06/07/2018 1332   BILITOT 0.3 06/07/2018 1332   GFRNONAA 93 06/07/2018 1332   GFRAA 107 06/07/2018 1332   Diabetic Labs (most recent): Lab Results  Component Value Date   HGBA1C 7.9 (H) 09/06/2018   HGBA1C 8.7 (H) 06/07/2018   HGBA1C 8.4 (H) 03/09/2018     Lipid Panel ( most recent) Lipid Panel     Component Value Date/Time   CHOL 137 06/07/2018 1332   TRIG 348 (H) 06/07/2018 1332   HDL 34 (L) 06/07/2018 1332   CHOLHDL 4.0 06/07/2018 1332   LDLCALC 33 06/07/2018 1332   LABVLDL 70 (H) 06/07/2018 1332     Lab Results  Component Value Date   TSH 2.13 02/07/2018   TSH 3.510 09/16/2016   TSH 1.270 09/11/2014     Assessment & Plan:   1. Uncontrolled type 2 diabetes mellitus with hyperglycemia ([place])  - Timothy Bouche. has currently uncontrolled symptomatic type 2 DM since  53 years of age. -He reports controlled  fasting blood glucose ranging from 111-185, bedtime between 115-177.  His most recent A1c was 7.9% improving from 8.7%.  -I have reviewed his recent labs with him. - I had a long discussion with him about the progressive nature of diabetes and the pathology behind its complications. -his diabetes is complicated by obesity/sedentary life, history of  smoking and he remains at a high risk for more acute and chronic complications which include CAD, CVA, CKD, retinopathy, and neuropathy. These are all discussed in detail with him.  - I have counseled him on diet  and weight management  by adopting a carbohydrate restricted/protein rich diet. Patient is encouraged to switch to  unprocessed or minimally processed     complex starch and increased protein intake (animal or plant source), fruits, and vegetables. -  he is advised to stick to a routine mealtimes to eat 3 meals  a day and avoid unnecessary snacks ( to snack only to correct hypoglycemia).   - he  admits there is a room for improvement in his diet and drink choices. -  Suggestion is made for him to avoid simple carbohydrates  from his diet including Cakes, Sweet Desserts / Pastries, Ice Cream, Soda (diet and regular), Sweet Tea, Candies, Chips, Cookies, Sweet Pastries,  Store Bought Juices, Alcohol in Excess of  1-2 drinks a day, Artificial Sweeteners, Coffee Creamer, and "Sugar-free" Products. This will help patient to have stable blood glucose profile and potentially avoid unintended weight gain.   - he will be scheduled with Jearld Fenton, RDN, CDE for diabetes education.  - I have approached him with the following individualized plan to manage  his diabetes and patient agrees:   -Based on his current glycemic response, he will not need insulin treatment for now.   -He will monitor blood glucose at least 1 time a day before breakfast. - he is encouraged to call clinic for blood glucose levels less than 70 or above 200 mg /dl. - he is advised to continue metformin 1000 mg p.o. twice daily, and glipizide 10 mg XL p.o. daily at breakfast.    - he will be considered for incretin therapy as appropriate next visit.  - Specific targets for  A1c;  LDL, HDL, Triglycerides, and  Waist Circumference were discussed with the patient.  2) Blood Pressure /Hypertension:  he is advised to home  monitor blood pressure and report if > 140/90 on 2 separate readings.  he is advised to continue his current medications including lisinopril 20 mg p.o. daily with breakfast . 3) Lipids/Hyperlipidemia:   Review of his recent lipid panel showed  controlled  LDL at 70, however uncontrolled hypertriglyceridemia at 148.  he  is advised to continue    simvastatin 40 mg daily at bedtime.  Side effects and precautions discussed with him.  He has documented intolerance to fenofibrate causing rash.  He is advised to cut back on butter and fried food consumption.  4)  Weight/Diet: His BMI is 36.7--   clearly complicating his diabetes care.   he is  a candidate for weight loss. I discussed with him the fact that loss of 5 - 10% of his  current body weight will have the most impact on his diabetes management.  Exercise, and detailed carbohydrates information provided  -  detailed on discharge instructions.  5) Chronic Care/Health Maintenance:  -he  is on ACEI/ARB and Statin medications and  is encouraged to initiate and continue to follow up with Ophthalmology, Dentist,  Podiatrist at  least yearly or according to recommendations, and advised to   stay away from smoking.  He is safe he does not believe in flu shots,  I have recommended yearly flu vaccine and pneumonia vaccine at least every 5 years; moderate intensity exercise for up to 150 minutes weekly; and  sleep for at least 7 hours a day.  - he is  advised to maintain close follow up with Timothy Nelson, Timothy Kaufmann, Timothy Nelson for primary care needs, as well as his other providers for optimal and coordinated care.  - Patient Care Time Today:  25 min, of which >50% was spent in  counseling and the rest reviewing his  current and  previous labs/studies, previous treatments, his blood glucose readings, and medications' doses and developing a plan for long-term care based on the latest recommendations for standards of care.   Timothy Bouche. participated in the discussions,  expressed understanding, and voiced agreement with the above plans.  All questions were answered to his satisfaction. he is encouraged to contact clinic should he have any questions or concerns prior to his return visit.   Follow up plan: - Return in about 3 months (around 01/16/2019) for Bring Meter and Logs- A1c in Office.  Glade Lloyd, Timothy Nelson [facility] [address] [place], [place] [address] Phone: [phone]  Fax: [phone]    10/16/2018, 1:29 PM  This note was partially dictated with voice recognition software. Similar sounding words can be transcribed inadequately or may not  be corrected upon review.

## 2018-10-17 ENCOUNTER — Other Ambulatory Visit: Payer: Self-pay | Admitting: Gastroenterology

## 2018-10-17 NOTE — Telephone Encounter (Signed)
Please see how the patient is doing. Follow-up is overdue for his symptoms reviewed on consultation in February 2020. Thank you.

## 2018-10-17 NOTE — Telephone Encounter (Signed)
No answer. Mailbox cannot accept any messages at this time.

## 2018-10-19 DIAGNOSIS — B309 Viral conjunctivitis, unspecified: Secondary | ICD-10-CM | POA: Diagnosis not present

## 2018-11-08 ENCOUNTER — Encounter: Payer: Self-pay | Admitting: Nutrition

## 2018-11-08 ENCOUNTER — Other Ambulatory Visit: Payer: Self-pay

## 2018-11-08 ENCOUNTER — Encounter: Payer: Medicare HMO | Attending: "Endocrinology | Admitting: Nutrition

## 2018-11-08 VITALS — Ht 72.0 in | Wt 257.0 lb

## 2018-11-08 DIAGNOSIS — E1165 Type 2 diabetes mellitus with hyperglycemia: Secondary | ICD-10-CM | POA: Insufficient documentation

## 2018-11-08 DIAGNOSIS — I10 Essential (primary) hypertension: Secondary | ICD-10-CM | POA: Insufficient documentation

## 2018-11-08 DIAGNOSIS — E782 Mixed hyperlipidemia: Secondary | ICD-10-CM | POA: Diagnosis not present

## 2018-11-08 NOTE — Progress Notes (Signed)
  Medical Nutrition Therapy:  Appt start time: [id number] to 915 am.  Assessment:  Primary concerns today:  DM Type 2, Obesity, HTN and Lipidemia.  Here with wife. Sees Dr. Dorris Fetch, Endocrinology and PCP Dr.Dettinger. Volunteers as a Agricultural consultant. Has had mulitple surgeries to back and legs from falling off a roof 13 yrs ago. Has neuropathy in his feet. Walks some but not a lot for exercise. Wants to improve his DM and lose weight. Has changed a lot of eating habits since meeting with Dr. Dorris Fetch, Endocrinology. Use to drink a lot of sodas and high fat high CHO meals. Glucatrol xl 10 mg a day and Metformin 1000 mg BID. Wife is supportive.  Diet is inconsistent to meet his needs right now. He has changed a lot of his eating habits and now being too restrictive in CHO and snacking because he's hungry. Taking meds as prescribed. FBS 130-140's usually. Admits to being hungry in evening and wanting to eat often. Lab Results  Component Value Date   HGBA1C 7.9 (H) 09/06/2018  Preferred Learning Style:   No preference indicated   Learning Readiness:  Ready  Change in progress   MEDICATIONS: see ;ost   DIETARY INTAKE:  24-hr recall:  B ( AM): 2 ww toast, coffee;  (3 egg omlets, 4 slices toast and bacon) Snk ( AM): pita chips, black berries, misc  berries L ( PM): pork ribs, water Snk ( PM):  D ( PM): chicken burrito -1 , rice and beans, corn chips, salsa, water Snk ( PM): blckberries Beverages: water  Usual physical activity: ADL  Estimated energy needs: 1800 calories 200 g carbohydrates 135 g protein 50 g fat  Progress Towards Goal(s):  In progress.   Nutritional Diagnosis:  NB-1.1 Food and nutrition-related knowledge deficit As related to Diabetes Type 2.  As evidenced by A1C 7.9%.    Intervention:  Nutrition and Diabetes education provided on My Plate, CHO counting, meal planning, portion sizes, timing of meals, avoiding snacks between meals unless having a low blood sugar, target ranges  for A1C and blood sugars, signs/symptoms and treatment of hyper/hypoglycemia, monitoring blood sugars, taking medications as prescribed, benefits of exercising 30 minutes per day and prevention of complications of DM.   Goals  Follow My Plate  Eat 4 carb choices per meal Use veggies and protein for snacks Walk 30 minutes a day if able or ride exercise bike. Watch portions Don't eat past 7 pm. Drink only water Lose 1-2 lbs per week.  Teaching Method Utilized:  Visual Auditory Hands on  Handouts given during visit include:  The Plate Method   Meal Plan Card  Diabetes Instructions   Barriers to learning/adherence to lifestyle change: leg issues  Demonstrated degree of understanding via:  Teach Back   Monitoring/Evaluation:  Dietary intake, exercise, , and body weight in 1 month(s).

## 2018-11-08 NOTE — Patient Instructions (Addendum)
Goals  Follow My Plate  Eat 4 carb choices per meal Use veggies and protein for snacks Walk 30 minutes a day if able or ride exercise bike. Watch portions Don't eat past 7 pm. Drink only water Lose 1-2 lbs per week.

## 2018-12-06 ENCOUNTER — Encounter: Payer: Self-pay | Admitting: Family Medicine

## 2018-12-06 ENCOUNTER — Ambulatory Visit (INDEPENDENT_AMBULATORY_CARE_PROVIDER_SITE_OTHER): Payer: Medicare HMO | Admitting: Family Medicine

## 2018-12-06 DIAGNOSIS — G8929 Other chronic pain: Secondary | ICD-10-CM | POA: Diagnosis not present

## 2018-12-06 DIAGNOSIS — E782 Mixed hyperlipidemia: Secondary | ICD-10-CM

## 2018-12-06 DIAGNOSIS — E1159 Type 2 diabetes mellitus with other circulatory complications: Secondary | ICD-10-CM

## 2018-12-06 DIAGNOSIS — E785 Hyperlipidemia, unspecified: Secondary | ICD-10-CM | POA: Diagnosis not present

## 2018-12-06 DIAGNOSIS — E1165 Type 2 diabetes mellitus with hyperglycemia: Secondary | ICD-10-CM | POA: Diagnosis not present

## 2018-12-06 DIAGNOSIS — M79671 Pain in right foot: Secondary | ICD-10-CM

## 2018-12-06 DIAGNOSIS — I152 Hypertension secondary to endocrine disorders: Secondary | ICD-10-CM

## 2018-12-06 DIAGNOSIS — E1169 Type 2 diabetes mellitus with other specified complication: Secondary | ICD-10-CM | POA: Diagnosis not present

## 2018-12-06 DIAGNOSIS — I10 Essential (primary) hypertension: Secondary | ICD-10-CM | POA: Diagnosis not present

## 2018-12-06 DIAGNOSIS — K219 Gastro-esophageal reflux disease without esophagitis: Secondary | ICD-10-CM

## 2018-12-06 MED ORDER — OXYCODONE HCL 10 MG PO TABS: 10.0000 mg | ORAL_TABLET | Freq: Four times a day (QID) | ORAL | 0 refills | Status: DC | PRN

## 2018-12-06 MED ORDER — SIMVASTATIN 40 MG PO TABS: 40.0000 mg | ORAL_TABLET | Freq: Every day | ORAL | 3 refills | Status: DC

## 2018-12-06 MED ORDER — FISH OIL 1200 MG PO CPDR: 1200.0000 mg | DELAYED_RELEASE_CAPSULE | Freq: Every day | ORAL | 3 refills | Status: AC

## 2018-12-06 MED ORDER — METFORMIN HCL 1000 MG PO TABS: 1000.0000 mg | ORAL_TABLET | Freq: Two times a day (BID) | ORAL | 3 refills | Status: DC

## 2018-12-06 MED ORDER — GLIPIZIDE ER 10 MG PO TB24: 10.0000 mg | ORAL_TABLET | Freq: Every day | ORAL | 3 refills | Status: DC

## 2018-12-06 MED ORDER — LISINOPRIL 20 MG PO TABS: 20.0000 mg | ORAL_TABLET | Freq: Every day | ORAL | 3 refills | Status: DC

## 2018-12-06 NOTE — Progress Notes (Signed)
Virtual Visit via telephone Note  I connected with Timothy Nelson. on 12/06/18 at 1400 by telephone and verified that I am speaking with the correct person using two identifiers. Timothy Nelson. is currently located at home and No other people are currently with her during visit. The provider, Fransisca Kaufmann Adora Yeh, MD is located in their office at time of visit.  Call ended at 1409  I discussed the limitations, risks, security and privacy concerns of performing an evaluation and management service by telephone and the availability of in person appointments. I also discussed with the patient that there may be a patient responsible charge related to this service. The patient expressed understanding and agreed to proceed.   History and Present Illness: Type 2 diabetes mellitus Patient is seeing endocrinology and is between 110-140 in am. Patient comes in today for recheck of his diabetes. Patient has been currently taking metformin and glipizide but is really focused with his endocrinologist on doing diet better. Patient is currently on an ACE inhibitor/ARB. Patient has not seen an ophthalmologist this year. Patient denies any issues with their feet.   Hypertension Patient is currently on lisinopril, and their blood pressure today is unknown but he says is been running well. Patient denies any lightheadedness or dizziness. Patient denies headaches, blurred vision, chest pains, shortness of breath, or weakness. Denies any side effects from medication and is content with current medication.   Hyperlipidemia Patient is coming in for recheck of his hyperlipidemia. The patient is currently taking simvastatin. They deny any issues with myalgias or history of liver damage from it. They deny any focal numbness or weakness or chest pain.   Pain assessment: Cause of pain- b/l ankles , history of trauma Pain location- both ankles Pain on scale of 1-10- 8, worse with winter Frequency- daily What  increases pain-cold weather and being on feet What makes pain Better-rest and oxycodone and shoes from the podiatrist to help Effects on ADL -minimal Any change in general medical condition-none  Current opioids rx-oxycodone 10 mg 4 times daily as needed # meds rx-120 Effectiveness of current meds-working well Adverse reactions form pain meds-none Morphine equivalent- 60  Pill count performed-No Last drug screen - 03/09/2018 ( high risk q25m moderate risk q670mlow risk yearly ) Urine drug screen today- No [place]  If yes were their any concerning findings? -None  No flowsheet data found.   Pain contract signed on: 03/09/18  No diagnosis found.  Outpatient Encounter Medications as of 12/06/2018  Medication Sig  . CINNAMON PO Take 1 capsule by mouth 2 (two) times daily.  . fluticasone (FLONASE) 50 MCG/ACT nasal spray PLACE 1 SPRAY INTO BOTH NOSTRILS 2 (TWO) TIMES DAILY AS NEEDED FOR ALLERGIES OR RHINITIS.  . Marland KitchenlipiZIDE (GLUCOTROL XL) 10 MG 24 hr tablet Take 1 tablet (10 mg total) by mouth daily with breakfast.  . glucose blood (ACCU-CHEK AVIVA) test strip Use as instructed  . ketoconazole (NIZORAL) 2 % cream Apply 1 application topically daily.  . [place]more Guiseevices (ACCU-CHEK SOFTCLIX) lancets Use as instructed  . lisinopril (PRINIVIL,ZESTRIL) 20 MG tablet Take 1 tablet (20 mg total) by mouth daily.  . metFORMIN (GLUCOPHAGE) 1000 MG tablet Take 1 tablet (1,000 mg total) by mouth 2 (two) times daily with a meal.  . Na Sulfate-K Sulfate-Mg Sulf 17.5-3.13-1.6 GM/177ML SOLN Take 1 kit by mouth as directed.  . Omega-3 Fatty Acids (FISH OIL) 1200 MG CPDR Take 1,200 mg by mouth daily.  . Marland Kitchenmeprazole (  PRILOSEC) 40 MG capsule TAKE 1 CAPSULE BY MOUTH EVERY DAY  . Oxycodone HCl 10 MG TABS Take 1 tablet (10 mg total) by mouth 4 (four) times daily as needed. Do not refill until 30 days from prescription date  . Oxycodone HCl 10 MG TABS Take 1 tablet (10 mg total) by mouth 4 (four)  times daily as needed.  . Oxycodone HCl 10 MG TABS Take 1 tablet (10 mg total) by mouth 4 (four) times daily as needed. Do not refill until 60 days from prescription date  . simvastatin (ZOCOR) 40 MG tablet Take 1 tablet (40 mg total) by mouth daily at 6 PM.  . triamcinolone cream (KENALOG) 0.1 % Apply 1 application topically 2 (two) times daily.   No facility-administered encounter medications on file as of 12/06/2018.     Review of Systems  Constitutional: Negative for chills and fever.  Respiratory: Negative for shortness of breath and wheezing.   Cardiovascular: Negative for chest pain and leg swelling.  Musculoskeletal: Positive for arthralgias. Negative for back pain and gait problem.  Skin: Negative for rash.  Neurological: Negative for weakness and numbness.  All other systems reviewed and are negative.   Observations/Objective: Patient sounds comfortable and in no acute distress  Assessment and Plan: Problem List Items Addressed This Visit      Cardiovascular and Mediastinum   Hypertension associated with diabetes ([place])   Relevant Medications   simvastatin (ZOCOR) 40 MG tablet   metFORMIN (GLUCOPHAGE) 1000 MG tablet   glipiZIDE (GLUCOTROL XL) 10 MG 24 hr tablet   lisinopril (ZESTRIL) 20 MG tablet   Essential hypertension, benign   Relevant Medications   simvastatin (ZOCOR) 40 MG tablet   lisinopril (ZESTRIL) 20 MG tablet     Digestive   GERD (gastroesophageal reflux disease)   Relevant Medications   pantoprazole (PROTONIX) 40 MG tablet     Endocrine   Uncontrolled type 2 diabetes mellitus with hyperglycemia ([facility]) - Primary   Relevant Medications   simvastatin (ZOCOR) 40 MG tablet   metFORMIN (GLUCOPHAGE) 1000 MG tablet   glipiZIDE (GLUCOTROL XL) 10 MG 24 hr tablet   lisinopril (ZESTRIL) 20 MG tablet   Hyperlipidemia associated with type 2 diabetes mellitus ([facility])   Relevant Medications   simvastatin (ZOCOR) 40 MG tablet   metFORMIN (GLUCOPHAGE) 1000 MG tablet    glipiZIDE (GLUCOTROL XL) 10 MG 24 hr tablet   lisinopril (ZESTRIL) 20 MG tablet     Other   Chronic foot pain   Relevant Medications   Oxycodone HCl 10 MG TABS (Start on 01/05/2019)   Oxycodone HCl 10 MG TABS   Oxycodone HCl 10 MG TABS (Start on 02/05/2019)   Mixed hyperlipidemia   Relevant Medications   simvastatin (ZOCOR) 40 MG tablet   lisinopril (ZESTRIL) 20 MG tablet       Follow Up Instructions: Follow-up in 3 months, will continue to see endocrinology to manage most of his other issues, we will see him for pain management and manage peripherally.     I discussed the assessment and treatment plan with the patient. The patient was provided an opportunity to ask questions and all were answered. The patient agreed with the plan and demonstrated an understanding of the instructions.   The patient was advised to call back or seek an in-person evaluation if the symptoms worsen or if the condition fails to improve as anticipated.  The above assessment and management plan was discussed with the patient. The patient verbalized understanding of and  has agreed to the management plan. Patient is aware to call the clinic if symptoms persist or worsen. Patient is aware when to return to the clinic for a follow-up visit. Patient educated on when it is appropriate to go to the emergency department.    I provided 9 minutes of non-face-to-face time during this encounter.    Worthy Rancher, MD

## 2018-12-13 ENCOUNTER — Other Ambulatory Visit: Payer: Self-pay | Admitting: *Deleted

## 2018-12-13 DIAGNOSIS — J0100 Acute maxillary sinusitis, unspecified: Secondary | ICD-10-CM

## 2018-12-13 MED ORDER — KETOCONAZOLE 2 % EX CREA: 1.0000 "application " | TOPICAL_CREAM | Freq: Every day | CUTANEOUS | 2 refills | Status: DC

## 2018-12-13 MED ORDER — FLUTICASONE PROPIONATE 50 MCG/ACT NA SUSP: 1.0000 | Freq: Two times a day (BID) | NASAL | 2 refills | Status: DC | PRN

## 2018-12-13 MED ORDER — TRIAMCINOLONE ACETONIDE 0.1 % EX CREA: 1.0000 "application " | TOPICAL_CREAM | Freq: Two times a day (BID) | CUTANEOUS | 1 refills | Status: AC

## 2018-12-14 DIAGNOSIS — E1142 Type 2 diabetes mellitus with diabetic polyneuropathy: Secondary | ICD-10-CM | POA: Diagnosis not present

## 2018-12-14 DIAGNOSIS — L859 Epidermal thickening, unspecified: Secondary | ICD-10-CM | POA: Diagnosis not present

## 2018-12-14 DIAGNOSIS — L602 Onychogryphosis: Secondary | ICD-10-CM | POA: Diagnosis not present

## 2018-12-18 ENCOUNTER — Encounter: Payer: Self-pay | Admitting: "Endocrinology

## 2018-12-18 DIAGNOSIS — H1840 Unspecified corneal degeneration: Secondary | ICD-10-CM | POA: Diagnosis not present

## 2018-12-18 DIAGNOSIS — H5210 Myopia, unspecified eye: Secondary | ICD-10-CM | POA: Diagnosis not present

## 2018-12-18 DIAGNOSIS — E119 Type 2 diabetes mellitus without complications: Secondary | ICD-10-CM | POA: Diagnosis not present

## 2018-12-18 DIAGNOSIS — I10 Essential (primary) hypertension: Secondary | ICD-10-CM | POA: Diagnosis not present

## 2018-12-18 DIAGNOSIS — H2513 Age-related nuclear cataract, bilateral: Secondary | ICD-10-CM | POA: Diagnosis not present

## 2018-12-18 DIAGNOSIS — E7800 Pure hypercholesterolemia, unspecified: Secondary | ICD-10-CM | POA: Diagnosis not present

## 2018-12-18 LAB — HM DIABETES EYE EXAM

## 2018-12-25 ENCOUNTER — Encounter: Payer: Medicare HMO | Attending: Family Medicine | Admitting: Nutrition

## 2018-12-25 ENCOUNTER — Other Ambulatory Visit: Payer: Self-pay

## 2018-12-25 ENCOUNTER — Encounter: Payer: Self-pay | Admitting: Nutrition

## 2018-12-25 DIAGNOSIS — E118 Type 2 diabetes mellitus with unspecified complications: Secondary | ICD-10-CM | POA: Diagnosis not present

## 2018-12-25 DIAGNOSIS — I10 Essential (primary) hypertension: Secondary | ICD-10-CM

## 2018-12-25 DIAGNOSIS — E1169 Type 2 diabetes mellitus with other specified complication: Secondary | ICD-10-CM

## 2018-12-25 DIAGNOSIS — E785 Hyperlipidemia, unspecified: Secondary | ICD-10-CM | POA: Insufficient documentation

## 2018-12-25 DIAGNOSIS — E1165 Type 2 diabetes mellitus with hyperglycemia: Secondary | ICD-10-CM | POA: Insufficient documentation

## 2018-12-25 DIAGNOSIS — IMO0002 Reserved for concepts with insufficient information to code with codable children: Secondary | ICD-10-CM

## 2018-12-25 NOTE — Patient Instructions (Signed)
  Goals Drink gallon of water per day Increase exercise. Get A1C down to 7% L<ose 3-4 lbs per month.

## 2018-12-25 NOTE — Progress Notes (Signed)
  Medical Nutrition Therapy:  Appt start time: 0800 to 0815 am.  Assessment:  Primary concerns today:  DM Type 2, Obesity, HTN and Lipidemia.  Here with wife. Sees Dr. Dorris Fetch, Endocrinology and PCP Dr.Dettinger. Blood sugars the last 5 days: 141, 193, 168 ,, 200, 151 mg/d. He notes he drinks a lot of black coffee and trying to get in some water. Has drank only 1 soda in the last month.  Changes Trying to eat more vegetables-cauliflower, broccoli, brussel sprouts, and more fresh fruits, Glipizide  xl 10 mg a day and Metformin 1000 mg BID. Doing well. Admits he needs to drink more water and get more exercise.  Sees Dr. Dorris Fetch in January 2021. Making better food choices. Lab Results  Component Value Date   HGBA1C 7.9 (H) 09/06/2018  Preferred Learning Style:   No preference indicated   Learning Readiness:  Ready  Change in progress   MEDICATIONS: see ;ost   DIETARY INTAKE:  24-hr recall:  B ( AM)lenders bagel with butter, a pear, coffee Snk ( AM): L ( PM): boneless chicken, stuffing and orange, water Snk ( PM):  D ( PM):  Roast beef, green beans, ans sweet potato, water Milk snd 2 cookiesSnk ( PM): blckberries Beverages: water  Usual physical activity: ADL  Estimated energy needs: 1800 calories 200 g carbohydrates 135 g protein 50 g fat  Progress Towards Goal(s):  In progress.   Nutritional Diagnosis:  NB-1.1 Food and nutrition-related knowledge deficit As related to Diabetes Type 2.  As evidenced by A1C 7.9%.    Intervention:  Nutrition and Diabetes education provided on My Plate, CHO counting, meal planning, portion sizes, timing of meals, avoiding snacks between meals unless having a low blood sugar, target ranges for A1C and blood sugars, signs/symptoms and treatment of hyper/hypoglycemia, monitoring blood sugars, taking medications as prescribed, benefits of exercising 30 minutes per day and prevention of complications of DM.   Goals Drink gallon of water per  day Increase exercise. Get A1C down to 7% L<ose 3-4 lbs per month.   Teaching Method Utilized:  Visual Auditory Hands on  Handouts given during visit include:  The Plate Method   Meal Plan Card  Diabetes Instructions   Barriers to learning/adherence to lifestyle change: leg issues  Demonstrated degree of understanding via:  Teach Back   Monitoring/Evaluation:  Dietary intake, exercise, , and body weight in 1 month(s).

## 2019-01-09 ENCOUNTER — Encounter: Payer: Self-pay | Admitting: Family Medicine

## 2019-01-11 DIAGNOSIS — B309 Viral conjunctivitis, unspecified: Secondary | ICD-10-CM | POA: Diagnosis not present

## 2019-01-11 DIAGNOSIS — H179 Unspecified corneal scar and opacity: Secondary | ICD-10-CM | POA: Diagnosis not present

## 2019-01-16 ENCOUNTER — Encounter: Payer: Self-pay | Admitting: "Endocrinology

## 2019-01-16 ENCOUNTER — Ambulatory Visit: Payer: Medicare HMO | Admitting: "Endocrinology

## 2019-01-16 ENCOUNTER — Encounter: Payer: Medicare HMO | Attending: "Endocrinology | Admitting: Nutrition

## 2019-01-16 ENCOUNTER — Encounter: Payer: Self-pay | Admitting: Nutrition

## 2019-01-16 ENCOUNTER — Other Ambulatory Visit: Payer: Self-pay

## 2019-01-16 VITALS — Ht 74.0 in | Wt 257.6 lb

## 2019-01-16 VITALS — BP 128/80 | HR 73 | Ht 71.0 in | Wt 257.0 lb

## 2019-01-16 DIAGNOSIS — E1165 Type 2 diabetes mellitus with hyperglycemia: Secondary | ICD-10-CM | POA: Diagnosis not present

## 2019-01-16 DIAGNOSIS — E782 Mixed hyperlipidemia: Secondary | ICD-10-CM

## 2019-01-16 DIAGNOSIS — I10 Essential (primary) hypertension: Secondary | ICD-10-CM | POA: Insufficient documentation

## 2019-01-16 DIAGNOSIS — E118 Type 2 diabetes mellitus with unspecified complications: Secondary | ICD-10-CM | POA: Insufficient documentation

## 2019-01-16 DIAGNOSIS — E669 Obesity, unspecified: Secondary | ICD-10-CM | POA: Diagnosis not present

## 2019-01-16 DIAGNOSIS — IMO0002 Reserved for concepts with insufficient information to code with codable children: Secondary | ICD-10-CM

## 2019-01-16 LAB — POCT GLYCOSYLATED HEMOGLOBIN (HGB A1C): Hemoglobin A1C: 7.2 % — AB (ref 4.0–5.6)

## 2019-01-16 NOTE — Progress Notes (Signed)
  Medical Nutrition Therapy:  Appt start time: 0800 to 0815 am.  Assessment:  Primary concerns today:  DM Type 2, Obesity, HTN and Lipidemia.  Here with wife. Sees Dr. Dorris Fetch, Endocrinology and PCP Dr.Dettinger. Feeling much better. Has more energy. A1C down to 7.2 from 7.6%. Making progress. Eating more vegetables and fresh fruits. Eating meals on time. Starting to walk some. Drinking mostly water and has cut down on coffee. Wt stable. Has cut out eating after dinner and that has helped blood sugars.   Lab Results  Component Value Date   HGBA1C 7.2 (A) 01/16/2019  Preferred Learning Style:   No preference indicated   Learning Readiness:  Ready  Change in progress   MEDICATIONS: see ;ost   DIETARY INTAKE:  24-hr recall:  B ( AM)lenders bagel with butter, a pear, coffee Snk ( AM): L ( PM): boneless chicken, stuffing and orange, water Snk ( PM):  D ( PM):  Roast beef, green beans, ans sweet potato, water. fruit Snk ( PM):  Beverages: water  Usual physical activity: ADL  Estimated energy needs: 1800 calories 200 g carbohydrates 135 g protein 50 g fat  Progress Towards Goal(s):  In progress.   Nutritional Diagnosis:  NB-1.1 Food and nutrition-related knowledge deficit As related to Diabetes Type 2.  As evidenced by A1C 7.9%.    Intervention:  Nutrition and Diabetes education provided on My Plate, CHO counting, meal planning, portion sizes, timing of meals, avoiding snacks between meals unless having a low blood sugar, target ranges for A1C and blood sugars, signs/symptoms and treatment of hyper/hypoglycemia, monitoring blood sugars, taking medications as prescribed, benefits of exercising 30 minutes per day and prevention of complications of DM.   Goals Keep up the great job! Eat 45-60 grams of carbs per meal. Drink gallon of water per day Increase exercise 30 minutes a day twice a day. Get A1C down to 7% Lose 3-4 lbs per month.   Teaching Method Utilized:   Visual Auditory Hands on  Handouts given during visit include:  The Plate Method   Meal Plan Card  Diabetes Instructions   Barriers to learning/adherence to lifestyle change: leg issues  Demonstrated degree of understanding via:  Teach Back   Monitoring/Evaluation:  Dietary intake, exercise, , and body weight in 4 month(s).

## 2019-01-16 NOTE — Patient Instructions (Signed)
Goals Keep up the great job! Eat 45-60 grams of carbs per meal. Drink gallon of water per day Increase exercise 30 minutes a day twice a day. Get A1C down to 7% Lose 3-4 lbs per month.

## 2019-01-16 NOTE — Patient Instructions (Signed)

## 2019-01-16 NOTE — Progress Notes (Signed)
01/16/2019, 1:45 PM   Endocrinology follow-up note    Subjective:    Patient ID: Timothy Nelson., male    DOB: 01-16-1965.  Timothy Nelson. is being seen in follow-up after he was seen in consultation for management of currently uncontrolled symptomatic diabetes requested by  Dettinger, Fransisca Kaufmann, MD.   Past Medical History:  Diagnosis Date  . Arthritis   . Barrett's esophagus   . Chronic pain   . Diabetes ([place])   . Essential hypertension   . Gastroparesis   . GERD (gastroesophageal reflux disease)   . Hyperlipidemia   . Obesity   . Type 2 diabetes mellitus ([place])     Past Surgical History:  Procedure Laterality Date  . ESOPHAGOGASTRODUODENOSCOPY (EGD) WITH PROPOFOL N/A 04/04/2015   Procedure: ESOPHAGOGASTRODUODENOSCOPY (EGD) WITH PROPOFOL;  Surgeon: Rogene Houston, MD;  Location: [facility];  Service: Endoscopy;  Laterality: N/A;  8:30  . FRACTURE SURGERY  March 2007   Left arm, left thumb, bilateral legs  . HIP SURGERY Right   . WRIST FRACTURE SURGERY Left     Social History   Socioeconomic History  . Marital status: Married    Spouse name: Not on file  . Number of children: 1  . Years of education: Not on file  . Highest education level: Associate degree: occupational, Hotel manager, or vocational program  Occupational History  . Occupation: Disability  Tobacco Use  . Smoking status: Current Some Day Smoker    Packs/day: 1.00    Years: 20.00    Pack years: 20.00    Types: Cigars  . Smokeless tobacco: Never Used  . Tobacco comment: Smoked intermittently for 20 years; 1-2 cigars weekly as of 24/2020  Substance and Sexual Activity  . Alcohol use: Yes    Alcohol/week: 1.0 standard drinks    Types: 1 Glasses of wine per week    Comment: glass wine a week.   . Drug use: No  . Sexual activity: Yes  Other Topics Concern  . Not on file  Social History Narrative  . Not on file   Social  Determinants of Health   Financial Resource Strain:   . Difficulty of Paying Living Expenses: Not on file  Food Insecurity:   . Worried About Charity fundraiser in the Last Year: Not on file  . Ran Out of Food in the Last Year: Not on file  Transportation Needs:   . Lack of Transportation (Medical): Not on file  . Lack of Transportation (Non-Medical): Not on file  Physical Activity:   . Days of Exercise per Week: Not on file  . Minutes of Exercise per Session: Not on file  Stress:   . Feeling of Stress : Not on file  Social Connections:   . Frequency of Communication with Friends and Family: Not on file  . Frequency of Social Gatherings with Friends and Family: Not on file  . Attends Religious Services: Not on file  . Active Member of Clubs or Organizations: Not on file  . Attends Archivist Meetings: Not on file  . Marital Status: Not on file    Family History  Problem Relation Age of Onset  .  Diabetes Mother   . Hypertension Mother   . Hyperlipidemia Mother   . Arthritis Mother   . Esophageal cancer Mother        patient unsure  . Diabetes Father   . Heart disease Father        Age 34  . Kidney disease Father   . Hypertension Sister   . Diabetes Daughter   . Colon cancer Neg Hx   . Stomach cancer Neg Hx   . Pancreatic cancer Neg Hx   . Liver disease Neg Hx     Outpatient Encounter Medications as of 01/16/2019  Medication Sig  . CINNAMON PO Take 1 capsule by mouth 2 (two) times daily.  . fluticasone (FLONASE) 50 MCG/ACT nasal spray Place 1 spray into both nostrils 2 (two) times daily as needed for allergies or rhinitis.  Marland Kitchen glipiZIDE (GLUCOTROL XL) 10 MG 24 hr tablet Take 1 tablet (10 mg total) by mouth daily with breakfast.  . glucose blood (ACCU-CHEK AVIVA) test strip Use as instructed  . ketoconazole (NIZORAL) 2 % cream Apply 1 application topically daily.  Elmore Guise Devices (ACCU-CHEK SOFTCLIX) lancets Use as instructed  . lisinopril (ZESTRIL) 20 MG  tablet Take 1 tablet (20 mg total) by mouth daily.  . metFORMIN (GLUCOPHAGE) 1000 MG tablet Take 1 tablet (1,000 mg total) by mouth 2 (two) times daily with a meal.  . Na Sulfate-K Sulfate-Mg Sulf 17.5-3.13-1.6 GM/177ML SOLN Take 1 kit by mouth as directed.  . Omega-3 Fatty Acids (FISH OIL) 1200 MG CPDR Take 1,200 mg by mouth daily.  . Oxycodone HCl 10 MG TABS Take 1 tablet (10 mg total) by mouth 4 (four) times daily as needed.  . Oxycodone HCl 10 MG TABS Take 1 tablet (10 mg total) by mouth 4 (four) times daily as needed.  . pantoprazole (PROTONIX) 40 MG tablet Take 40 mg by mouth daily.  . simvastatin (ZOCOR) 40 MG tablet Take 1 tablet (40 mg total) by mouth daily at 6 PM.  . triamcinolone cream (KENALOG) 0.1 % Apply 1 application topically 2 (two) times daily.  . [DISCONTINUED] Oxycodone HCl 10 MG TABS Take 1 tablet (10 mg total) by mouth 4 (four) times daily as needed.   No facility-administered encounter medications on file as of 01/16/2019.    ALLERGIES: Allergies  Allergen Reactions  . Fenofibrate Rash    Permament rash.     VACCINATION STATUS: Immunization History  Administered Date(s) Administered  . Influenza,inj,Quad PF,6+ Mos 11/15/2018  . Influenza-Unspecified 09/04/2016  . Pneumococcal Polysaccharide-23 09/16/2016    Diabetes He presents for his follow-up diabetic visit. He has type 2 diabetes mellitus. Onset time: He was diagnosed at approximate age of 23 years. His disease course has been improving. There are no hypoglycemic associated symptoms. Pertinent negatives for hypoglycemia include no confusion, headaches, pallor or seizures. Pertinent negatives for diabetes include no chest pain, no fatigue, no polydipsia, no polyphagia, no polyuria and no weakness. There are no hypoglycemic complications. Symptoms are improving. There are no diabetic complications. Risk factors for coronary artery disease include diabetes mellitus, dyslipidemia, family history, obesity, male  sex, hypertension, sedentary lifestyle and tobacco exposure. Current diabetic treatment includes oral agent (triple therapy). His weight is fluctuating minimally. He is following a generally unhealthy diet. When asked about meal planning, he reported none. He has not had a previous visit with a dietitian. He rarely participates in exercise. His home blood glucose trend is decreasing steadily. An ACE inhibitor/angiotensin II receptor blocker is being taken.  Hyperlipidemia This is a chronic problem. The current episode started more than 1 year ago. The problem is uncontrolled. Recent lipid tests were reviewed and are high. Exacerbating diseases include diabetes and obesity. Pertinent negatives include no chest pain, myalgias or shortness of breath. Current antihyperlipidemic treatment includes statins. Risk factors for coronary artery disease include dyslipidemia, diabetes mellitus, family history, hypertension, male sex, obesity and a sedentary lifestyle.  Hypertension This is a chronic problem. The current episode started more than 1 year ago. Pertinent negatives include no chest pain, headaches, neck pain, palpitations or shortness of breath. Risk factors for coronary artery disease include diabetes mellitus, dyslipidemia, male gender, obesity, sedentary lifestyle, family history and smoking/tobacco exposure. Past treatments include ACE inhibitors.    Review of systems: Limited as above.  Objective:    BP 128/80   Pulse 73   Ht 5' 11"  (1.803 m)   Wt 257 lb (116.6 kg)   BMI 35.84 kg/m   Wt Readings from Last 3 Encounters:  01/16/19 257 lb (116.6 kg)  11/08/18 257 lb (116.6 kg)  10/03/18 263 lb (119.3 kg)      CMP ( most recent) CMP     Component Value Date/Time   NA 140 06/07/2018 1332   K 4.4 06/07/2018 1332   CL 100 06/07/2018 1332   CO2 19 (L) 06/07/2018 1332   GLUCOSE 131 (H) 06/07/2018 1332   GLUCOSE 208 (H) 04/03/2015 1500   BUN 18 06/07/2018 1332   CREATININE 0.94  06/07/2018 1332   CALCIUM 9.6 06/07/2018 1332   PROT 7.0 06/07/2018 1332   ALBUMIN 4.3 06/07/2018 1332   AST 20 06/07/2018 1332   ALT 31 06/07/2018 1332   ALKPHOS 110 06/07/2018 1332   BILITOT 0.3 06/07/2018 1332   GFRNONAA 93 06/07/2018 1332   GFRAA 107 06/07/2018 1332   Diabetic Labs (most recent): Lab Results  Component Value Date   HGBA1C 7.2 (A) 01/16/2019   HGBA1C 7.9 (H) 09/06/2018   HGBA1C 8.7 (H) 06/07/2018     Lipid Panel ( most recent) Lipid Panel     Component Value Date/Time   CHOL 137 06/07/2018 1332   TRIG 348 (H) 06/07/2018 1332   HDL 34 (L) 06/07/2018 1332   CHOLHDL 4.0 06/07/2018 1332   LDLCALC 33 06/07/2018 1332   LABVLDL 70 (H) 06/07/2018 1332     Lab Results  Component Value Date   TSH 2.13 02/07/2018   TSH 3.510 09/16/2016   TSH 1.270 09/11/2014     Assessment & Plan:   1. Uncontrolled type 2 diabetes mellitus with hyperglycemia ([place])  - Timothy Nelson. has currently uncontrolled symptomatic type 2 DM since  54 years of age. -He denies any hypoglycemia.  His point-of-care A1c 7.3%, progressively improving from 8.7%.    - I had a long discussion with him about the progressive nature of diabetes and the pathology behind its complications. -his diabetes is complicated by obesity/sedentary life, history of smoking and he remains at a high risk for more acute and chronic complications which include CAD, CVA, CKD, retinopathy, and neuropathy. These are all discussed in detail with him.  - I have counseled him on diet  and weight management  by adopting a carbohydrate restricted/protein rich diet. Patient is encouraged to switch to  unprocessed or minimally processed     complex starch and increased protein intake (animal or plant source), fruits, and vegetables. -  he is advised to stick to a routine mealtimes to eat 3 meals  a  day and avoid unnecessary snacks ( to snack only to correct hypoglycemia).   - he  admits there is a room for improvement  in his diet and drink choices. -  Suggestion is made for him to avoid simple carbohydrates  from his diet including Cakes, Sweet Desserts / Pastries, Ice Cream, Soda (diet and regular), Sweet Tea, Candies, Chips, Cookies, Sweet Pastries,  Store Bought Juices, Alcohol in Excess of  1-2 drinks a day, Artificial Sweeteners, Coffee Creamer, and "Sugar-free" Products. This will help patient to have stable blood glucose profile and potentially avoid unintended weight gain.  - he will be scheduled with Jearld Fenton, RDN, CDE for diabetes education.  - I have approached him with the following individualized plan to manage  his diabetes and patient agrees:   -Based on his current glycemic response, he will not need any insulin treatment for now.   -He agrees to continue to monitor blood glucose at least 1 time a day before breakfast. - he is encouraged to call clinic for blood glucose levels less than 70 or above 200 mg /dl. - he is advised to continue Metformin 1000 mg p.o. twice daily-after breakfast and after supper, and glipizide  10 mg XL p.o. daily at breakfast.    - he will be considered for incretin therapy as appropriate next visit.  - Specific targets for  A1c;  LDL, HDL, Triglycerides, and  Waist Circumference were discussed with the patient.  2) Blood Pressure /Hypertension:  -His blood pressure is controlled to target.  he is advised to continue his current medications including lisinopril 20 mg p.o. daily with breakfast .  3) Lipids/Hyperlipidemia:   Review of his recent lipid panel showed  controlled  LDL at 70, however uncontrolled hypertriglyceridemia at 148.  he  is advised to continue simvastatin 40 mg p.o. daily at bedtime. Side effects and precautions discussed with him.  He has documented intolerance to fenofibrate causing rash.  He is advised to cut back on butter and fried food consumption.  4)  Weight/Diet: His BMI is 36.7--   clearly complicating his diabetes care.   he is   a candidate for weight loss. I discussed with him the fact that loss of 5 - 10% of his  current body weight will have the most impact on his diabetes management.  Exercise, and detailed carbohydrates information provided  -  detailed on discharge instructions.  5) Chronic Care/Health Maintenance:  -he  is on ACEI/ARB and Statin medications and  is encouraged to initiate and continue to follow up with Ophthalmology, Dentist,  Podiatrist at least yearly or according to recommendations, and advised to   stay away from smoking.  He is safe he does not believe in flu shots,  I have recommended yearly flu vaccine and pneumonia vaccine at least every 5 years; moderate intensity exercise for up to 150 minutes weekly; and  sleep for at least 7 hours a day.  - he is  advised to maintain close follow up with Dettinger, Fransisca Kaufmann, MD for primary care needs, as well as his other providers for optimal and coordinated care.  - Time spent on this patient care encounter:  35 min, of which >50% was spent in  counseling and the rest reviewing his  current and  previous labs/studies ( including abstraction from other facilities),  previous treatments, his blood glucose readings, and medications' doses and developing a plan for long-term care based on the latest recommendations for standards of care; and documenting  his care.  Timothy Nelson. participated in the discussions, expressed understanding, and voiced agreement with the above plans.  All questions were answered to his satisfaction. he is encouraged to contact clinic should he have any questions or concerns prior to his return visit.  Follow up plan: - Return in about 4 months (around 05/16/2019) for Bring Meter and Logs- A1c in Office.  Glade Lloyd, MD [facility] [address] [place],  [address] Phone: [phone]  Fax: [phone]    01/16/2019, 1:45 PM  This note was partially dictated with  voice recognition software. Similar sounding words can be transcribed inadequately or may not  be corrected upon review.

## 2019-01-30 ENCOUNTER — Encounter: Payer: Self-pay | Admitting: Family Medicine

## 2019-01-31 MED ORDER — ONETOUCH VERIO W/DEVICE KIT: 1.0000 | PACK | Freq: Four times a day (QID) | 1 refills | Status: DC

## 2019-01-31 MED ORDER — ONETOUCH VERIO VI STRP: ORAL_STRIP | 12 refills | Status: DC

## 2019-01-31 NOTE — Addendum Note (Signed)
Addended by: Caryl Pina on: 01/31/2019 01:54 PM   Modules accepted: Orders

## 2019-02-05 ENCOUNTER — Telehealth: Payer: Self-pay | Admitting: *Deleted

## 2019-02-05 NOTE — Telephone Encounter (Signed)
One Touch Verio Strips is not covered by pt insurance, no alternatives were given.

## 2019-02-06 MED ORDER — ACCU-CHEK AVIVA PLUS VI STRP: 1.0000 | ORAL_STRIP | Freq: Four times a day (QID) | 12 refills | Status: DC

## 2019-02-06 MED ORDER — ACCU-CHEK AVIVA PLUS W/DEVICE KIT: 1.0000 | PACK | Freq: Four times a day (QID) | 1 refills | Status: DC

## 2019-02-06 NOTE — Telephone Encounter (Signed)
Please let the patient know that I sent a different meter for him to see if it is covered

## 2019-02-14 DIAGNOSIS — L602 Onychogryphosis: Secondary | ICD-10-CM | POA: Diagnosis not present

## 2019-02-14 DIAGNOSIS — E1142 Type 2 diabetes mellitus with diabetic polyneuropathy: Secondary | ICD-10-CM | POA: Diagnosis not present

## 2019-02-14 DIAGNOSIS — L859 Epidermal thickening, unspecified: Secondary | ICD-10-CM | POA: Diagnosis not present

## 2019-03-07 ENCOUNTER — Encounter: Payer: Self-pay | Admitting: Family Medicine

## 2019-03-07 ENCOUNTER — Ambulatory Visit (INDEPENDENT_AMBULATORY_CARE_PROVIDER_SITE_OTHER): Payer: Medicare HMO | Admitting: Family Medicine

## 2019-03-07 DIAGNOSIS — G8929 Other chronic pain: Secondary | ICD-10-CM

## 2019-03-07 DIAGNOSIS — M79671 Pain in right foot: Secondary | ICD-10-CM

## 2019-03-07 MED ORDER — OXYCODONE HCL 10 MG PO TABS: 10.0000 mg | ORAL_TABLET | Freq: Four times a day (QID) | ORAL | 0 refills | Status: DC | PRN

## 2019-03-07 NOTE — Progress Notes (Signed)
Virtual Visit via telephone Note  I connected with Timothy Nelson. on 03/07/19 at 1406 by telephone and verified that I am speaking with the correct person using two identifiers. Timothy Nelson. is currently located at home and no other people are currently with her during visit. The provider, Fransisca Kaufmann Dettinger, MD is located in their office at time of visit.  Call ended at 1415  I discussed the limitations, risks, security and privacy concerns of performing an evaluation and management service by telephone and the availability of in person appointments. I also discussed with the patient that there may be a patient responsible charge related to this service. The patient expressed understanding and agreed to proceed.   History and Present Illness: Pain assessment: Cause of pain-chronic foot and ankle pain from deformities caused by a fall when he was younger, he sees podiatry as well for this Pain location-both feet and ankles Pain on scale of 1-10- 6 Frequency-Daily What increases pain-walking or being on his feet for longer periods What makes pain Better-resting and medication Effects on ADL -limits ability to walk distances Any change in general medical condition-none, diabetes is improving with endocrinology  Current opioids rx-oxycodone 10 mg 4 times daily as needed # meds rx-120 Effectiveness of current meds-working well Adverse reactions form pain meds-none Morphine equivalent-60  Pill count performed-No Last drug screen -03/10/2018 ( high risk q72m moderate risk q612mlow risk yearly ) U[place] today- Yes [place]  If yes were their any concerning findings? -None  No flowsheet data found.   Pain contract signed on: 03/07/2019  1. Chronic pain in right foot     Outpatient Encounter Medications as of 03/07/2019  Medication Sig  . Blood Glucose Monitoring Suppl (ACCU-CHEK AVIVA PLUS) w/Device KIT 1 each by Does not apply route 4 (four) times  daily.  . Marland KitchenINNAMON PO Take 1 capsule by mouth 2 (two) times daily.  . fluticasone (FLONASE) 50 MCG/ACT nasal spray Place 1 spray into both nostrils 2 (two) times daily as needed for allergies or rhinitis.  . Marland KitchenlipiZIDE (GLUCOTROL XL) 10 MG 24 hr tablet Take 1 tablet (10 mg total) by mouth daily with breakfast.  . glucose blood (ACCU-CHEK AVIVA PLUS) test strip 1 each by Other route 4 (four) times daily. Use as instructed  . ketoconazole (NIZORAL) 2 % cream Apply 1 application topically daily.  . Elmore Guiseevices (ACCU-CHEK SOFTCLIX) lancets Use as instructed  . lisinopril (ZESTRIL) 20 MG tablet Take 1 tablet (20 mg total) by mouth daily.  . metFORMIN (GLUCOPHAGE) 1000 MG tablet Take 1 tablet (1,000 mg total) by mouth 2 (two) times daily with a meal.  . Na Sulfate-K Sulfate-Mg Sulf 17.5-3.13-1.6 GM/177ML SOLN Take 1 kit by mouth as directed.  . Omega-3 Fatty Acids (FISH OIL) 1200 MG CPDR Take 1,200 mg by mouth daily.  . Derrill MemoN 05/06/2019] Oxycodone HCl 10 MG TABS Take 1 tablet (10 mg total) by mouth 4 (four) times daily as needed.  . Derrill MemoN 04/06/2019] Oxycodone HCl 10 MG TABS Take 1 tablet (10 mg total) by mouth 4 (four) times daily as needed.  . Oxycodone HCl 10 MG TABS Take 1 tablet (10 mg total) by mouth 4 (four) times daily as needed.  . pantoprazole (PROTONIX) 40 MG tablet Take 40 mg by mouth daily.  . simvastatin (ZOCOR) 40 MG tablet Take 1 tablet (40 mg total) by mouth daily at 6 PM.  . triamcinolone cream (KENALOG) 0.1 % Apply  1 application topically 2 (two) times daily.  . [DISCONTINUED] Oxycodone HCl 10 MG TABS Take 1 tablet (10 mg total) by mouth 4 (four) times daily as needed.  . [DISCONTINUED] Oxycodone HCl 10 MG TABS Take 1 tablet (10 mg total) by mouth 4 (four) times daily as needed.   No facility-administered encounter medications on file as of 03/07/2019.    Review of Systems  Constitutional: Negative for chills and fever.  Eyes: Negative for discharge.  Respiratory: Negative  for shortness of breath and wheezing.   Cardiovascular: Negative for chest pain and leg swelling.  Musculoskeletal: Positive for arthralgias, gait problem and joint swelling. Negative for back pain.  Skin: Negative for rash.  Neurological: Negative for dizziness, weakness and light-headedness.  All other systems reviewed and are negative.   Observations/Objective: Patient sounds comfortable and in no acute distress  Assessment and Plan: Problem List Items Addressed This Visit      Other   Chronic foot pain   Relevant Medications   Oxycodone HCl 10 MG TABS (Start on 05/06/2019)   Oxycodone HCl 10 MG TABS (Start on 04/06/2019)   Oxycodone HCl 10 MG TABS   Other Relevant Orders   ToxASSURE Select 13 (MW), Urine      Continue oxycodone, patient is coming in to do a tox assure and contract today Follow up plan: Return in about 3 months (around 06/07/2019), or if symptoms worsen or fail to improve, for Pain management.     I discussed the assessment and treatment plan with the patient. The patient was provided an opportunity to ask questions and all were answered. The patient agreed with the plan and demonstrated an understanding of the instructions.   The patient was advised to call back or seek an in-person evaluation if the symptoms worsen or if the condition fails to improve as anticipated.  The above assessment and management plan was discussed with the patient. The patient verbalized understanding of and has agreed to the management plan. Patient is aware to call the clinic if symptoms persist or worsen. Patient is aware when to return to the clinic for a follow-up visit. Patient educated on when it is appropriate to go to the emergency department.    I provided 9 minutes of non-face-to-face time during this encounter.    Worthy Rancher, MD

## 2019-03-08 ENCOUNTER — Encounter: Payer: Self-pay | Admitting: Family Medicine

## 2019-03-14 LAB — TOXASSURE SELECT 13 (MW), URINE

## 2019-03-24 ENCOUNTER — Encounter: Payer: Self-pay | Admitting: Family Medicine

## 2019-03-24 DIAGNOSIS — G5612 Other lesions of median nerve, left upper limb: Secondary | ICD-10-CM

## 2019-03-26 ENCOUNTER — Telehealth: Payer: Self-pay | Admitting: Family Medicine

## 2019-03-26 NOTE — Chronic Care Management (AMB) (Signed)
  Chronic Care Management   Note  03/26/2019 Name: Chuck Caban. MRN: [id number] DOB: Nov 10, 1965  Jameal Razzano. is a 54 y.o. year old male who is a primary care patient of Dettinger, Fransisca Kaufmann, MD. I reached out to [name] by phone today in response to a referral sent by Mr. Shaheem Pichon Jr.'s health plan.     Mr. Breton was given information about Chronic Care Management services today including:  1. CCM service includes personalized support from designated clinical staff supervised by his physician, including individualized plan of care and coordination with other care providers 2. 24/7 contact phone numbers for assistance for urgent and routine care needs. 3. Service will only be billed when office clinical staff spend 20 minutes or more in a month to coordinate care. 4. Only one practitioner may furnish and bill the service in a calendar month. 5. The patient may stop CCM services at any time (effective at the end of the month) by phone call to the office staff. 6. The patient will be responsible for cost sharing (co-pay) of up to 20% of the service fee (after annual deductible is met).  Patient did not agree to enrollment in care management services and does not wish to consider at this time.  Follow up plan: The patient has been provided with contact information for the care management team and has been advised to call with any health related questions or concerns.   Noreene Larsson, [place], [place], [place] [address] Direct Dial: [phone] [email or web address] Website: [email or web address]

## 2019-04-09 ENCOUNTER — Ambulatory Visit: Payer: Medicare HMO | Admitting: Physician Assistant

## 2019-04-09 ENCOUNTER — Encounter: Payer: Self-pay | Admitting: Physician Assistant

## 2019-04-09 ENCOUNTER — Other Ambulatory Visit: Payer: Self-pay

## 2019-04-09 DIAGNOSIS — M79642 Pain in left hand: Secondary | ICD-10-CM | POA: Diagnosis not present

## 2019-04-09 NOTE — Addendum Note (Signed)
Addended by: Michae Kava B on: 04/09/2019 04:19 PM   Modules accepted: Orders

## 2019-04-09 NOTE — Progress Notes (Signed)
Office Visit Note   Patient: Timothy Nelson.           Date of Birth: 08/14/1965           MRN: [id number] Visit Date: 04/09/2019              Requested by: Dettinger, Fransisca Kaufmann, MD [place],  [place] [id number] PCP: Dettinger, Fransisca Kaufmann, MD   Assessment & Plan: Visit Diagnoses:  1. Pain of left hand     Plan: Due to his symptoms and clinical findings recommend EMG nerve conduction studies rule out carpal tunnel syndrome left hand.  Have him follow-up after the EMG nerve conduction studies to go the results and discuss further treatment.  Follow-Up Instructions: Return After EMG nerve conduction studies.   Orders:  No orders of the defined types were placed in this encounter.  No orders of the defined types were placed in this encounter.     Procedures: No procedures performed   Clinical Data: No additional findings.   Subjective: Chief Complaint  Patient presents with  . Left Hand - Pain, Numbness    HPI Timothy Nelson is a pleasant 54 year old male comes in today due to left hand numbness tingling.  Is been ongoing for the past 3 to 4 months.  Its awakening him.  He said no acute injury to the left hand.  States that the numbness affects his thumb through his ring finger.  Is worse at night.  Also occurs while driving.  He has a history of right carpal tunnel release several years ago.  Prior to carpal tunnel release he tried splints without any relief.  Said no recent EMG nerve conduction studies.  He is diabetic last hemoglobin A1c was 7.2.   Review of Systems Negative for fevers, chills, shortness of breath, chest pain.  Objective: Vital Signs: There were no vitals taken for this visit.  Physical Exam Constitutional:      Appearance: He is not ill-appearing or diaphoretic.  Pulmonary:     Effort: Pulmonary effort is normal.  Neurological:     Mental Status: He is alert and oriented to person, place, and time.  Psychiatric:        Mood and Affect: Mood  normal.     Ortho Exam Right hand well-healed surgical incision.  Full sensation throughout the hand.  Left hand subjective decreased sensation over the thumb.  Positive Tinel's over the median nerve left hand positive Phalen's on the left negative on the right.  Radial pulses are 2+.  Specialty Comments:  No specialty comments available.  Imaging: No results found.   PMFS History: Patient Active Problem List   Diagnosis Date Noted  . Essential hypertension, benign 10/03/2018  . Mixed hyperlipidemia 10/03/2018  . Diabetic ulcer of right midfoot associated with type 2 diabetes mellitus, with fat layer exposed ([place]) 09/30/2017  . Neuropathy 08/05/2017  . Diabetes mellitus, type 2 ([place]) 08/05/2017  . Poor sleep hygiene 02/04/2017  . 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 Tobacco use disorder 01/19/2017  . Obsessive compulsive disorder 01/19/2017  . Class 2 drug-induced obesity with serious comorbidity and body mass index (BMI) of 35.0 to 35.9 in adult 01/19/2017  . Snoring 01/19/2017  . Excessive daytime sleepiness 01/19/2017  . GERD (gastroesophageal reflux disease) 01/09/2015  . Uncontrolled type 2 diabetes mellitus with hyperglycemia ([place]) 09/11/2014  . Hypertension associated with diabetes ([place]) 09/11/2014  . Hyperlipidemia associated with type 2 diabetes mellitus ([place])  09/11/2014  . Chronic foot pain 09/11/2014   Past Medical History:  Diagnosis Date  . Arthritis   . Barrett's esophagus   . Chronic pain   . Diabetes ([place])   . Essential hypertension   . Gastroparesis   . GERD (gastroesophageal reflux disease)   . Hyperlipidemia   . Obesity   . Type 2 diabetes mellitus ([facility])     Family History  Problem Relation Age of Onset  . Diabetes Mother   . Hypertension Mother   . Hyperlipidemia Mother   . Arthritis Mother   . Esophageal cancer Mother        patient unsure  . Diabetes Father   . Heart disease Father        Age 69  . Kidney disease  Father   . Hypertension Sister   . Diabetes Daughter   . Colon cancer Neg Hx   . Stomach cancer Neg Hx   . Pancreatic cancer Neg Hx   . Liver disease Neg Hx     Past Surgical History:  Procedure Laterality Date  . ESOPHAGOGASTRODUODENOSCOPY (EGD) WITH PROPOFOL N/A 04/04/2015   Procedure: ESOPHAGOGASTRODUODENOSCOPY (EGD) WITH PROPOFOL;  Surgeon: Rogene Houston, MD;  [facility];  Service: Endoscopy;  Laterality: N/A;  8:30  . FRACTURE SURGERY  March 2007   Left arm, left thumb, bilateral legs  . HIP SURGERY Right   . WRIST FRACTURE SURGERY Left    Social History   Occupational History  . Occupation: Disability  Tobacco Use  . Smoking status: Current Some Day Smoker    Packs/day: 1.00    Years: 20.00    Pack years: 20.00    Types: Cigars  . Smokeless tobacco: Never Used  . Tobacco comment: Smoked intermittently for 20 years; 1-2 cigars weekly as of 24/2020  Substance and Sexual Activity  . Alcohol use: Yes    Alcohol/week: 1.0 standard drinks    Types: 1 Glasses of wine per week    Comment: glass wine a week.   . Drug use: No  . Sexual activity: Yes

## 2019-04-10 DIAGNOSIS — L602 Onychogryphosis: Secondary | ICD-10-CM | POA: Diagnosis not present

## 2019-04-10 DIAGNOSIS — M7742 Metatarsalgia, left foot: Secondary | ICD-10-CM | POA: Diagnosis not present

## 2019-04-10 DIAGNOSIS — R262 Difficulty in walking, not elsewhere classified: Secondary | ICD-10-CM | POA: Diagnosis not present

## 2019-04-10 DIAGNOSIS — E1142 Type 2 diabetes mellitus with diabetic polyneuropathy: Secondary | ICD-10-CM | POA: Diagnosis not present

## 2019-04-10 DIAGNOSIS — L859 Epidermal thickening, unspecified: Secondary | ICD-10-CM | POA: Diagnosis not present

## 2019-04-15 ENCOUNTER — Encounter: Payer: Self-pay | Admitting: Family Medicine

## 2019-04-25 ENCOUNTER — Ambulatory Visit (INDEPENDENT_AMBULATORY_CARE_PROVIDER_SITE_OTHER): Payer: Medicare HMO | Admitting: Physical Medicine and Rehabilitation

## 2019-04-25 ENCOUNTER — Other Ambulatory Visit: Payer: Self-pay

## 2019-04-25 ENCOUNTER — Encounter: Payer: Self-pay | Admitting: Physical Medicine and Rehabilitation

## 2019-04-25 DIAGNOSIS — R202 Paresthesia of skin: Secondary | ICD-10-CM | POA: Diagnosis not present

## 2019-04-25 NOTE — Progress Notes (Signed)
Timothy Nelson. - 54 y.o. male MRN [id number]  Date of birth: 03-May-1965  Office Visit Note: Visit Date: 04/25/2019 PCP: Dettinger, Fransisca Kaufmann, MD Referred by: Dettinger, Fransisca Kaufmann, MD  Subjective: Chief Complaint  Patient presents with  . Left Hand - Numbness   HPI: Timothy Nelson. is a 54 y.o. male who comes in today For electrodiagnostic study of the left upper extremity as requested by Benita Stabile, P.A.-C.  Patient is right-hand dominant with a history of prior right carpal tunnel release.  He states that he has had electrodiagnostic study in the past in [place] prior to the carpal tunnel release and they just told him at the time that he had carpal tunnel syndrome.  We do not have that test to review.  Patient has type 2 diabetes with a history of diabetic ulcer and neuropathy.  He reports 3 to 4 months of worsening pain numbness and tingling particular in the left hand thumb index middle and ring finger and pretty classic median nerve distribution.  He reports some increased symptoms with resting his elbow on the surface particularly with driving and worse at night.  Does not really endorse much pain but he has significant numbness.  ROS Otherwise per HPI.  Assessment & Plan: Visit Diagnoses:  1. Paresthesia of skin     Plan: Impression: The above electrodiagnostic study is ABNORMAL and reveals evidence of a severe left median nerve entrapment at the wrist (carpal tunnel syndrome) affecting sensory and motor components.   There is no significant electrodiagnostic evidence of any other focal nerve entrapment, brachial plexopathy or cervical radiculopathy. There is some hint at underlying peripheral polyneuropathy.  Recommendations: 1.  Follow-up with referring physician. 2.  Continue current management of symptoms. 3.  Suggest surgical evaluation.  Meds & Orders: No orders of the defined types were placed in this encounter.   Orders Placed This Encounter  Procedures  . NCV with  EMG (electromyography)    Follow-up: Return in about 2 weeks (around 05/09/2019) for Benita Stabile, P.A.-C.   Procedures: No procedures performed  EMG & NCV Findings: Evaluation of the left median motor nerve showed prolonged distal onset latency (7.8 ms) and decreased conduction velocity (Elbow-Wrist, 42 m/s).  The left ulnar motor nerve showed decreased conduction velocity (B Elbow-Wrist, 50 m/s) and decreased conduction velocity (A Elbow-B Elbow, 52 m/s).  The left median (across palm) sensory nerve showed no response (Palm), prolonged distal peak latency (6.3 ms), and reduced amplitude (4.1 V).  All remaining nerves (as indicated in the following tables) were within normal limits.    All examined muscles (as indicated in the following table) showed no evidence of electrical instability.    Impression: The above electrodiagnostic study is ABNORMAL and reveals evidence of a severe left median nerve entrapment at the wrist (carpal tunnel syndrome) affecting sensory and motor components.   There is no significant electrodiagnostic evidence of any other focal nerve entrapment, brachial plexopathy or cervical radiculopathy. There is some hint at underlying peripheral polyneuropathy.  Recommendations: 1.  Follow-up with referring physician. 2.  Continue current management of symptoms. 3.  Suggest surgical evaluation.  ___________________________ Laurence Spates FAAPMR Board Certified, American Board of Physical Medicine and Rehabilitation    Nerve Conduction Studies Anti Sensory Summary Table   Stim Site NR Peak (ms) Norm Peak (ms) P-T Amp (V) Norm P-T Amp Site1 Site2 Delta-P (ms) Dist (cm) Vel (m/s) Norm Vel (m/s)  Left Median Acr Palm Anti Sensory (2nd Digit)  30.5C  Wrist    *6.3 <3.6 *4.1 >10 Wrist Palm  0.0    Palm *NR  <2.0          Left Radial Anti Sensory (Base 1st Digit)  30.9C  Wrist    2.3 <3.1 16.4  Wrist Base 1st Digit 2.3 0.0    Left Ulnar Anti Sensory (5th Digit)  31C    Wrist    3.6 <3.7 28.0 >15.0 Wrist 5th Digit 3.6 14.0 39 >38   Motor Summary Table   Stim Site NR Onset (ms) Norm Onset (ms) O-P Amp (mV) Norm O-P Amp Site1 Site2 Delta-0 (ms) Dist (cm) Vel (m/s) Norm Vel (m/s)  Left Median Motor (Abd Poll Brev)  31C  Wrist    *7.8 <4.2 5.4 >5 Elbow Wrist 5.6 23.5 *42 >50  Elbow    13.4  4.9         Left Ulnar Motor (Abd Dig Min)  31.4C  Wrist    3.6 <4.2 7.5 >3 B Elbow Wrist 4.8 24.0 *50 >53  B Elbow    8.4  6.0  A Elbow B Elbow 2.1 11.0 *52 >53  A Elbow    10.5  7.1          EMG   Side Muscle Nerve Root Ins Act Fibs Psw Amp Dur Poly Recrt Int Fraser Din Comment  Left Abd Poll Brev Median C8-T1 Nml Nml Nml Nml Nml 0 Nml Nml   Left 1stDorInt Ulnar C8-T1 Nml Nml Nml Nml Nml 0 Nml Nml   Left PronatorTeres Median C6-7 Nml Nml Nml Nml Nml 0 Nml Nml   Left Biceps Musculocut C5-6 Nml Nml Nml Nml Nml 0 Nml Nml   Left Deltoid Axillary C5-6 Nml Nml Nml Nml Nml 0 Nml Nml     Nerve Conduction Studies Anti Sensory Left/Right Comparison   Stim Site L Lat (ms) R Lat (ms) L-R Lat (ms) L Amp (V) R Amp (V) L-R Amp (%) Site1 Site2 L Vel (m/s) R Vel (m/s) L-R Vel (m/s)  Median Acr Palm Anti Sensory (2nd Digit)  30.5C  Wrist *6.3   *4.1   Wrist Palm     Palm             Radial Anti Sensory (Base 1st Digit)  30.9C  Wrist 2.3   16.4   Wrist Base 1st Digit     Ulnar Anti Sensory (5th Digit)  31C  Wrist 3.6   28.0   Wrist 5th Digit 39     Motor Left/Right Comparison   Stim Site L Lat (ms) R Lat (ms) L-R Lat (ms) L Amp (mV) R Amp (mV) L-R Amp (%) Site1 Site2 L Vel (m/s) R Vel (m/s) L-R Vel (m/s)  Median Motor (Abd Poll Brev)  31C  Wrist *7.8   5.4   Elbow Wrist *42    Elbow 13.4   4.9         Ulnar Motor (Abd Dig Min)  31.4C  Wrist 3.6   7.5   B Elbow Wrist *50    B Elbow 8.4   6.0   A Elbow B Elbow *52    A Elbow 10.5   7.1            Waveforms:             Clinical History: No specialty comments available.   He reports that he has been smoking  cigars. He has a 20.00 pack-year smoking history. He has never used smokeless  tobacco.  Recent Labs    06/07/18 1332 09/06/18 0832 01/16/19 1338  HGBA1C 8.7* 7.9* 7.2*    Objective:  VS:  HT:    WT:   BMI:     BP:   HR: bpm  TEMP: ( )  RESP:  Physical Exam Musculoskeletal:        General: No tenderness.     Comments: Inspection reveals prior carpal tunnel release scar on the right wrist and some flattening of the left APB but no atrophy of the bilateral APB or FDI or hand intrinsics. There is no swelling, color changes, allodynia or dystrophic changes. There is 5 out of 5 strength in the bilateral wrist extension, finger abduction and long finger flexion. There is intact sensation to light touch in all dermatomal and peripheral nerve distributions. There is a negative Hoffmann's test bilaterally.  Skin:    General: Skin is warm and dry.     Findings: No erythema or rash.  Neurological:     General: No focal deficit present.     Mental Status: He is alert and oriented to person, place, and time.     Sensory: No sensory deficit.     Motor: No weakness or abnormal muscle tone.     Coordination: Coordination normal.     Gait: Gait normal.  Psychiatric:        Mood and Affect: Mood normal.        Behavior: Behavior normal.        Thought Content: Thought content normal.     Ortho Exam  Imaging: No results found.  Past Medical/Family/Surgical/Social History: Medications & Allergies reviewed per EMR, new medications updated. Patient Active Problem List   Diagnosis Date Noted  . Essential hypertension, benign 10/03/2018  . Mixed hyperlipidemia 10/03/2018  . Diabetic ulcer of right midfoot associated with type 2 diabetes mellitus, with fat layer exposed ([place]) 09/30/2017  . Neuropathy 08/05/2017  . Diabetes mellitus, type 2 ([place]) 08/05/2017  . Poor sleep hygiene 02/04/2017  . 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 Tobacco use disorder 01/19/2017  .  Obsessive compulsive disorder 01/19/2017  . Class 2 drug-induced obesity with serious comorbidity and body mass index (BMI) of 35.0 to 35.9 in adult 01/19/2017  . Snoring 01/19/2017  . Excessive daytime sleepiness 01/19/2017  . GERD (gastroesophageal reflux disease) 01/09/2015  . Uncontrolled type 2 diabetes mellitus with hyperglycemia ([place]) 09/11/2014  . Hypertension associated with diabetes ([place]) 09/11/2014  . Hyperlipidemia associated with type 2 diabetes mellitus ([place]) 09/11/2014  . Chronic foot pain 09/11/2014   Past Medical History:  Diagnosis Date  . Arthritis   . Barrett's esophagus   . Chronic pain   . Diabetes ([place])   . Essential hypertension   . Gastroparesis   . GERD (gastroesophageal reflux disease)   . Hyperlipidemia   . Obesity   . Type 2 diabetes mellitus ([facility])    Family History  Problem Relation Age of Onset  . Diabetes Mother   . Hypertension Mother   . Hyperlipidemia Mother   . Arthritis Mother   . Esophageal cancer Mother        patient unsure  . Diabetes Father   . Heart disease Father        Age 62  . Kidney disease Father   . Hypertension Sister   . Diabetes Daughter   . Colon cancer Neg Hx   . Stomach cancer Neg Hx   . Pancreatic cancer Neg Hx   .  Liver disease Neg Hx    Past Surgical History:  Procedure Laterality Date  . ESOPHAGOGASTRODUODENOSCOPY (EGD) WITH PROPOFOL N/A 04/04/2015   Procedure: ESOPHAGOGASTRODUODENOSCOPY (EGD) WITH PROPOFOL;  Surgeon: Rogene Houston, MD;  Location[facility];  Service: Endoscopy;  Laterality: N/A;  8:30  . FRACTURE SURGERY  March 2007   Left arm, left thumb, bilateral legs  . HIP SURGERY Right   . WRIST FRACTURE SURGERY Left    Social History   Occupational History  . Occupation: Disability  Tobacco Use  . Smoking status: Current Some Day Smoker    Packs/day: 1.00    Years: 20.00    Pack years: 20.00    Types: Cigars  . Smokeless tobacco: Never Used  . Tobacco comment: Smoked intermittently  for 20 years; 1-2 cigars weekly as of 24/2020  Substance and Sexual Activity  . Alcohol use: Yes    Alcohol/week: 1.0 standard drinks    Types: 1 Glasses of wine per week    Comment: glass wine a week.   . Drug use: No  . Sexual activity: Yes

## 2019-04-25 NOTE — Progress Notes (Signed)
 .  Numeric Pain Rating Scale and Functional Assessment Average Pain 0   In the last MONTH (on 0-10 scale) has pain interfered with the following?  1. General activity like being  able to carry out your everyday physical activities such as walking, climbing stairs, carrying groceries, or moving a chair?  Rating(7)    

## 2019-04-26 NOTE — Procedures (Signed)
EMG & NCV Findings: Evaluation of the left median motor nerve showed prolonged distal onset latency (7.8 ms) and decreased conduction velocity (Elbow-Wrist, 42 m/s).  The left ulnar motor nerve showed decreased conduction velocity (B Elbow-Wrist, 50 m/s) and decreased conduction velocity (A Elbow-B Elbow, 52 m/s).  The left median (across palm) sensory nerve showed no response (Palm), prolonged distal peak latency (6.3 ms), and reduced amplitude (4.1 V).  All remaining nerves (as indicated in the following tables) were within normal limits.    All examined muscles (as indicated in the following table) showed no evidence of electrical instability.    Impression: The above electrodiagnostic study is ABNORMAL and reveals evidence of a severe left median nerve entrapment at the wrist (carpal tunnel syndrome) affecting sensory and motor components.   There is no significant electrodiagnostic evidence of any other focal nerve entrapment, brachial plexopathy or cervical radiculopathy. There is some hint at underlying peripheral polyneuropathy.  Recommendations: 1.  Follow-up with referring physician. 2.  Continue current management of symptoms. 3.  Suggest surgical evaluation.  ___________________________ Laurence Spates FAAPMR Board Certified, American Board of Physical Medicine and Rehabilitation    Nerve Conduction Studies Anti Sensory Summary Table   Stim Site NR Peak (ms) Norm Peak (ms) P-T Amp (V) Norm P-T Amp Site1 Site2 Delta-P (ms) Dist (cm) Vel (m/s) Norm Vel (m/s)  Left Median Acr Palm Anti Sensory (2nd Digit)  30.5C  Wrist    *6.3 <3.6 *4.1 >10 Wrist Palm  0.0    Palm *NR  <2.0          Left Radial Anti Sensory (Base 1st Digit)  30.9C  Wrist    2.3 <3.1 16.4  Wrist Base 1st Digit 2.3 0.0    Left Ulnar Anti Sensory (5th Digit)  31C  Wrist    3.6 <3.7 28.0 >15.0 Wrist 5th Digit 3.6 14.0 39 >38   Motor Summary Table   Stim Site NR Onset (ms) Norm Onset (ms) O-P Amp (mV) Norm O-P  Amp Site1 Site2 Delta-0 (ms) Dist (cm) Vel (m/s) Norm Vel (m/s)  Left Median Motor (Abd Poll Brev)  31C  Wrist    *7.8 <4.2 5.4 >5 Elbow Wrist 5.6 23.5 *42 >50  Elbow    13.4  4.9         Left Ulnar Motor (Abd Dig Min)  31.4C  Wrist    3.6 <4.2 7.5 >3 B Elbow Wrist 4.8 24.0 *50 >53  B Elbow    8.4  6.0  A Elbow B Elbow 2.1 11.0 *52 >53  A Elbow    10.5  7.1          EMG   Side Muscle Nerve Root Ins Act Fibs Psw Amp Dur Poly Recrt Int Fraser Din Comment  Left Abd Poll Brev Median C8-T1 Nml Nml Nml Nml Nml 0 Nml Nml   Left 1stDorInt Ulnar C8-T1 Nml Nml Nml Nml Nml 0 Nml Nml   Left PronatorTeres Median C6-7 Nml Nml Nml Nml Nml 0 Nml Nml   Left Biceps Musculocut C5-6 Nml Nml Nml Nml Nml 0 Nml Nml   Left Deltoid Axillary C5-6 Nml Nml Nml Nml Nml 0 Nml Nml     Nerve Conduction Studies Anti Sensory Left/Right Comparison   Stim Site L Lat (ms) R Lat (ms) L-R Lat (ms) L Amp (V) R Amp (V) L-R Amp (%) Site1 Site2 L Vel (m/s) R Vel (m/s) L-R Vel (m/s)  Median Acr Palm Anti Sensory (2nd Digit)  30.[place]  Wrist *6.3   *4.1   Wrist Palm     Palm             Radial Anti Sensory (Base 1st Digit)  30.9C  Wrist 2.3   16.4   Wrist Base 1st Digit     Ulnar Anti Sensory (5th Digit)  31C  Wrist 3.6   28.0   Wrist 5th Digit 39     Motor Left/Right Comparison   Stim Site L Lat (ms) R Lat (ms) L-R Lat (ms) L Amp (mV) R Amp (mV) L-R Amp (%) Site1 Site2 L Vel (m/s) R Vel (m/s) L-R Vel (m/s)  Median Motor (Abd Poll Brev)  31C  Wrist *7.8   5.4   Elbow Wrist *42    Elbow 13.4   4.9         Ulnar Motor (Abd Dig Min)  31.4C  Wrist 3.6   7.5   B Elbow Wrist *50    B Elbow 8.4   6.0   A Elbow B Elbow *52    A Elbow 10.5   7.1            Waveforms:

## 2019-05-02 ENCOUNTER — Other Ambulatory Visit: Payer: Self-pay

## 2019-05-02 ENCOUNTER — Ambulatory Visit: Payer: Medicare HMO | Admitting: Physician Assistant

## 2019-05-02 DIAGNOSIS — G5602 Carpal tunnel syndrome, left upper limb: Secondary | ICD-10-CM | POA: Insufficient documentation

## 2019-05-02 NOTE — Progress Notes (Signed)
Timothy Nelson comes in today to review the EMG nerve conduction studies of his left upper extremity.  Again he has undergone previous right carpal tunnel release in [place].  EMG nerve conduction studies of the left upper extremity showed severe median nerve entrapment consistent with carpal tunnel syndrome.  Patient states that he has significant numbness in the hand at all times now.  He is wanting to proceed with surgery.  Physical exam: Left hand subjective decreased sensation throughout the median nerve distribution.  No rashes skin lesions ulcerations.  No significant thenar atrophy.  Impression: Severe carpal tunnel syndrome left hand  Plan: We will proceed with left carpal tunnel release in the near future.  Questions encouraged and answered at length.  Postoperative protocol discussed with patient.  Risk benefits discussed with patient.  Questions were encouraged and answered by Dr. Ninfa Linden and myself.

## 2019-05-03 ENCOUNTER — Other Ambulatory Visit: Payer: Self-pay

## 2019-05-03 ENCOUNTER — Telehealth: Payer: Self-pay | Admitting: "Endocrinology

## 2019-05-03 DIAGNOSIS — I10 Essential (primary) hypertension: Secondary | ICD-10-CM

## 2019-05-03 DIAGNOSIS — E1165 Type 2 diabetes mellitus with hyperglycemia: Secondary | ICD-10-CM

## 2019-05-03 DIAGNOSIS — E782 Mixed hyperlipidemia: Secondary | ICD-10-CM

## 2019-05-03 NOTE — Telephone Encounter (Signed)
Orders sent to [facility]

## 2019-05-03 NOTE — Telephone Encounter (Signed)
Can you change his orders to [facility]

## 2019-05-07 ENCOUNTER — Encounter: Payer: Self-pay | Admitting: Family Medicine

## 2019-05-07 DIAGNOSIS — E1159 Type 2 diabetes mellitus with other circulatory complications: Secondary | ICD-10-CM

## 2019-05-07 DIAGNOSIS — E1165 Type 2 diabetes mellitus with hyperglycemia: Secondary | ICD-10-CM

## 2019-05-08 ENCOUNTER — Other Ambulatory Visit: Payer: Self-pay

## 2019-05-08 ENCOUNTER — Other Ambulatory Visit: Payer: Medicare HMO

## 2019-05-08 DIAGNOSIS — E661 Drug-induced obesity: Secondary | ICD-10-CM | POA: Diagnosis not present

## 2019-05-08 DIAGNOSIS — E1159 Type 2 diabetes mellitus with other circulatory complications: Secondary | ICD-10-CM

## 2019-05-08 DIAGNOSIS — E1165 Type 2 diabetes mellitus with hyperglycemia: Secondary | ICD-10-CM | POA: Diagnosis not present

## 2019-05-08 DIAGNOSIS — I10 Essential (primary) hypertension: Secondary | ICD-10-CM

## 2019-05-08 DIAGNOSIS — G629 Polyneuropathy, unspecified: Secondary | ICD-10-CM | POA: Diagnosis not present

## 2019-05-08 DIAGNOSIS — R0683 Snoring: Secondary | ICD-10-CM | POA: Diagnosis not present

## 2019-05-08 DIAGNOSIS — G4719 Other hypersomnia: Secondary | ICD-10-CM | POA: Diagnosis not present

## 2019-05-08 DIAGNOSIS — E782 Mixed hyperlipidemia: Secondary | ICD-10-CM

## 2019-05-08 DIAGNOSIS — F429 Obsessive-compulsive disorder, unspecified: Secondary | ICD-10-CM | POA: Diagnosis not present

## 2019-05-08 DIAGNOSIS — Z72821 Inadequate sleep hygiene: Secondary | ICD-10-CM | POA: Diagnosis not present

## 2019-05-08 LAB — MICROALBUMIN, URINE: Microalb, Ur: 11.7

## 2019-05-08 LAB — BAYER DCA HB A1C WAIVED: HB A1C (BAYER DCA - WAIVED): 7.6 % — ABNORMAL HIGH (ref <7.0)

## 2019-05-09 LAB — LIPID PANEL
Chol/HDL Ratio: 3.9 ratio (ref 0.0–5.0)
Cholesterol, Total: 152 mg/dL (ref 100–199)
HDL: 39 mg/dL — ABNORMAL LOW (ref >39)
LDL Chol Calc (NIH): 81 mg/dL (ref 0–99)
Triglycerides: 188 mg/dL — ABNORMAL HIGH (ref 0–149)
VLDL Cholesterol Cal: 32 mg/dL (ref 5–40)

## 2019-05-09 LAB — CBC WITH DIFFERENTIAL/PLATELET
Basophils Absolute: 0.1 10*3/uL (ref 0.0–0.2)
Basos: 1 %
EOS (ABSOLUTE): 0.3 10*3/uL (ref 0.0–0.4)
Eos: 3 %
Hematocrit: 53.9 % — ABNORMAL HIGH (ref 37.5–51.0)
Hemoglobin: 18.2 g/dL — ABNORMAL HIGH (ref 13.0–17.7)
Immature Grans (Abs): 0 10*3/uL (ref 0.0–0.1)
Immature Granulocytes: 0 %
Lymphocytes Absolute: 3 10*3/uL (ref 0.7–3.1)
Lymphs: 24 %
MCH: 31.5 pg (ref 26.6–33.0)
MCHC: 33.8 g/dL (ref 31.5–35.7)
MCV: 93 fL (ref 79–97)
Monocytes Absolute: 1.1 10*3/uL — ABNORMAL HIGH (ref 0.1–0.9)
Monocytes: 9 %
Neutrophils Absolute: 7.7 10*3/uL — ABNORMAL HIGH (ref 1.4–7.0)
Neutrophils: 63 %
Platelets: 229 10*3/uL (ref 150–450)
RBC: 5.78 x10E6/uL (ref 4.14–5.80)
RDW: 13.1 % (ref 11.6–15.4)
WBC: 12.2 10*3/uL — ABNORMAL HIGH (ref 3.4–10.8)

## 2019-05-09 LAB — MICROALBUMIN / CREATININE URINE RATIO
Creatinine, Urine: 154.9 mg/dL
Microalb/Creat Ratio: 8 mg/g creat (ref 0–29)
Microalbumin, Urine: 11.7 ug/mL

## 2019-05-09 LAB — CMP14+EGFR
ALT: 22 IU/L (ref 0–44)
AST: 22 IU/L (ref 0–40)
Albumin/Globulin Ratio: 1.8 (ref 1.2–2.2)
Albumin: 4.5 g/dL (ref 3.8–4.9)
Alkaline Phosphatase: 78 IU/L (ref 39–117)
BUN/Creatinine Ratio: 17 (ref 9–20)
BUN: 18 mg/dL (ref 6–24)
Bilirubin Total: 0.5 mg/dL (ref 0.0–1.2)
CO2: 21 mmol/L (ref 20–29)
Calcium: 9.7 mg/dL (ref 8.7–10.2)
Chloride: 100 mmol/L (ref 96–106)
Creatinine, Ser: 1.05 mg/dL (ref 0.76–1.27)
GFR calc Af Amer: 93 mL/min/{1.73_m2} (ref >59)
GFR calc non Af Amer: 81 mL/min/{1.73_m2} (ref >59)
Globulin, Total: 2.5 g/dL (ref 1.5–4.5)
Glucose: 140 mg/dL — ABNORMAL HIGH (ref 65–99)
Potassium: 4.4 mmol/L (ref 3.5–5.2)
Sodium: 135 mmol/L (ref 134–144)
Total Protein: 7 g/dL (ref 6.0–8.5)

## 2019-05-09 LAB — VITAMIN D 25 HYDROXY (VIT D DEFICIENCY, FRACTURES): Vit D, 25-Hydroxy: 30.1 ng/mL (ref 30.0–100.0)

## 2019-05-09 LAB — T4, FREE: Free T4: 1.48 ng/dL (ref 0.82–1.77)

## 2019-05-09 LAB — TSH: TSH: 2.01 u[IU]/mL (ref 0.450–4.500)

## 2019-05-11 DIAGNOSIS — F17210 Nicotine dependence, cigarettes, uncomplicated: Secondary | ICD-10-CM | POA: Diagnosis not present

## 2019-05-11 DIAGNOSIS — Z9981 Dependence on supplemental oxygen: Secondary | ICD-10-CM | POA: Diagnosis not present

## 2019-05-11 DIAGNOSIS — Z7984 Long term (current) use of oral hypoglycemic drugs: Secondary | ICD-10-CM | POA: Diagnosis not present

## 2019-05-11 DIAGNOSIS — M898X7 Other specified disorders of bone, ankle and foot: Secondary | ICD-10-CM | POA: Diagnosis not present

## 2019-05-11 DIAGNOSIS — Z6833 Body mass index (BMI) 33.0-33.9, adult: Secondary | ICD-10-CM | POA: Diagnosis not present

## 2019-05-11 DIAGNOSIS — G4733 Obstructive sleep apnea (adult) (pediatric): Secondary | ICD-10-CM | POA: Diagnosis not present

## 2019-05-11 DIAGNOSIS — E785 Hyperlipidemia, unspecified: Secondary | ICD-10-CM | POA: Diagnosis not present

## 2019-05-11 DIAGNOSIS — E119 Type 2 diabetes mellitus without complications: Secondary | ICD-10-CM | POA: Diagnosis not present

## 2019-05-11 DIAGNOSIS — E669 Obesity, unspecified: Secondary | ICD-10-CM | POA: Diagnosis not present

## 2019-05-11 DIAGNOSIS — E1142 Type 2 diabetes mellitus with diabetic polyneuropathy: Secondary | ICD-10-CM | POA: Diagnosis not present

## 2019-05-11 DIAGNOSIS — M7742 Metatarsalgia, left foot: Secondary | ICD-10-CM | POA: Diagnosis not present

## 2019-05-11 DIAGNOSIS — I10 Essential (primary) hypertension: Secondary | ICD-10-CM | POA: Diagnosis not present

## 2019-05-17 ENCOUNTER — Encounter: Payer: Self-pay | Admitting: "Endocrinology

## 2019-05-17 ENCOUNTER — Ambulatory Visit (INDEPENDENT_AMBULATORY_CARE_PROVIDER_SITE_OTHER): Payer: Medicare HMO | Admitting: "Endocrinology

## 2019-05-17 ENCOUNTER — Encounter: Payer: Medicare HMO | Admitting: Nutrition

## 2019-05-17 ENCOUNTER — Other Ambulatory Visit: Payer: Self-pay

## 2019-05-17 VITALS — BP 120/77 | HR 70 | Ht 71.0 in | Wt 260.2 lb

## 2019-05-17 DIAGNOSIS — E782 Mixed hyperlipidemia: Secondary | ICD-10-CM | POA: Diagnosis not present

## 2019-05-17 DIAGNOSIS — E1165 Type 2 diabetes mellitus with hyperglycemia: Secondary | ICD-10-CM | POA: Diagnosis not present

## 2019-05-17 DIAGNOSIS — I10 Essential (primary) hypertension: Secondary | ICD-10-CM | POA: Diagnosis not present

## 2019-05-17 NOTE — Progress Notes (Signed)
05/17/2019, 2:30 PM   Endocrinology follow-up note   Subjective:    Patient ID: Timothy Nelson., male    DOB: 01-27-1965.  [name] is being seen in follow-up after he was seen in consultation for management of currently uncontrolled symptomatic diabetes requested by  Dettinger, Fransisca Kaufmann, MD.   Past Medical History:  Diagnosis Date  . Arthritis   . Barrett's esophagus   . Chronic pain   . Diabetes ([place])   . Essential hypertension   . Gastroparesis   . GERD (gastroesophageal reflux disease)   . Hyperlipidemia   . Obesity   . Type 2 diabetes mellitus ([place])     Past Surgical History:  Procedure Laterality Date  . ESOPHAGOGASTRODUODENOSCOPY (EGD) WITH PROPOFOL N/A 04/04/2015   Procedure: ESOPHAGOGASTRODUODENOSCOPY (EGD) WITH PROPOFOL;  Surgeon: Rogene Houston, MD;  Location: [facility];  Service: Endoscopy;  Laterality: N/A;  8:30  . FRACTURE SURGERY  March 2007   Left arm, left thumb, bilateral legs  . HIP SURGERY Right   . WRIST FRACTURE SURGERY Left     Social History   Socioeconomic History  . Marital status: Married    Spouse name: Not on file  . Number of children: 1  . Years of education: Not on file  . Highest education level: Associate degree: occupational, Hotel manager, or vocational program  Occupational History  . Occupation: Disability  Tobacco Use  . Smoking status: Current Some Day Smoker    Packs/day: 1.00    Years: 20.00    Pack years: 20.00    Types: Cigars  . Smokeless tobacco: Never Used  . Tobacco comment: Smoked intermittently for 20 years; 1-2 cigars weekly as of 24/2020  Substance and Sexual Activity  . Alcohol use: Yes    Alcohol/week: 1.0 standard drinks    Types: 1 Glasses of wine per week    Comment: glass wine a week.   . Drug use: No  . Sexual activity: Yes  Other Topics Concern  . Not on file  Social History Narrative  . Not on file   Social  Determinants of Health   Financial Resource Strain:   . Difficulty of Paying Living Expenses:   Food Insecurity:   . Worried About Charity fundraiser in the Last Year:   . Arboriculturist in the Last Year:   Transportation Needs:   . Film/video editor (Medical):   Marland Kitchen Lack of Transportation (Non-Medical):   Physical Activity:   . Days of Exercise per Week:   . Minutes of Exercise per Session:   Stress:   . Feeling of Stress :   Social Connections:   . Frequency of Communication with Friends and Family:   . Frequency of Social Gatherings with Friends and Family:   . Attends Religious Services:   . Active Member of Clubs or Organizations:   . Attends Archivist Meetings:   Marland Kitchen Marital Status:     Family History  Problem Relation Age of Onset  . Diabetes Mother   . Hypertension Mother   . Hyperlipidemia Mother   . Arthritis Mother   . Esophageal cancer Mother  patient unsure  . Diabetes Father   . Heart disease Father        Age 45  . Kidney disease Father   . Hypertension Sister   . Diabetes Daughter   . Colon cancer Neg Hx   . Stomach cancer Neg Hx   . Pancreatic cancer Neg Hx   . Liver disease Neg Hx     Outpatient Encounter Medications as of 05/17/2019  Medication Sig  . Accu-Chek Softclix Lancets lancets   . Blood Glucose Monitoring Suppl (ACCU-CHEK AVIVA PLUS) w/Device KIT 1 each by Does not apply route 4 (four) times daily.  Marland Kitchen CINNAMON PO Take 1 capsule by mouth 2 (two) times daily.  . cycloSPORINE (RESTASIS) 0.05 % ophthalmic emulsion SMARTSIG:1 Drop(s) In Eye(s) Every 12 Hours  . fluticasone (FLONASE) 50 MCG/ACT nasal spray Place 1 spray into both nostrils 2 (two) times daily as needed for allergies or rhinitis.  Marland Kitchen FLUZONE QUADRIVALENT 0.5 ML injection   . glipiZIDE (GLUCOTROL XL) 10 MG 24 hr tablet Take 1 tablet (10 mg total) by mouth daily with breakfast.  . glucose blood (ACCU-CHEK AVIVA PLUS) test strip 1 each by Other route 4 (four)  times daily. Use as instructed  . ketoconazole (NIZORAL) 2 % cream Apply 1 application topically daily.  Elmore Guise Devices (ACCU-CHEK SOFTCLIX) lancets Use as instructed  . lisinopril (ZESTRIL) 20 MG tablet Take 1 tablet (20 mg total) by mouth daily.  . metFORMIN (GLUCOPHAGE) 1000 MG tablet Take 1 tablet (1,000 mg total) by mouth 2 (two) times daily with a meal.  . Na Sulfate-K Sulfate-Mg Sulf 17.5-3.13-1.6 GM/177ML SOLN Take 1 kit by mouth as directed.  . Omega-3 Fatty Acids (FISH OIL) 1200 MG CPDR Take 1,200 mg by mouth daily.  . Oxycodone HCl 10 MG TABS Take 1 tablet (10 mg total) by mouth 4 (four) times daily as needed.  . Oxycodone HCl 10 MG TABS Take 1 tablet (10 mg total) by mouth 4 (four) times daily as needed.  . Oxycodone HCl 10 MG TABS Take 1 tablet (10 mg total) by mouth 4 (four) times daily as needed.  . pantoprazole (PROTONIX) 40 MG tablet Take 40 mg by mouth daily.  . prednisoLONE acetate (PRED FORTE) 1 % ophthalmic suspension   . simvastatin (ZOCOR) 40 MG tablet Take 1 tablet (40 mg total) by mouth daily at 6 PM.  . triamcinolone cream (KENALOG) 0.1 % Apply 1 application topically 2 (two) times daily.  . [DISCONTINUED] gentamicin ointment (GARAMYCIN) 0.1 % Apply topically.   No facility-administered encounter medications on file as of 05/17/2019.    ALLERGIES: Allergies  Allergen Reactions  . Fenofibrate Rash    Permament rash.     VACCINATION STATUS: Immunization History  Administered Date(s) Administered  . Influenza,inj,Quad PF,6+ Mos 11/15/2018  . Influenza-Unspecified 09/04/2016  . Pneumococcal Polysaccharide-23 09/16/2016    Diabetes He presents for his follow-up diabetic visit. He has type 2 diabetes mellitus. Onset time: He was diagnosed at approximate age of 76 years. His disease course has been worsening. There are no hypoglycemic associated symptoms. Pertinent negatives for hypoglycemia include no confusion, headaches, pallor or seizures. Pertinent  negatives for diabetes include no chest pain, no fatigue, no polydipsia, no polyphagia, no polyuria and no weakness. There are no hypoglycemic complications. Symptoms are worsening. There are no diabetic complications. Risk factors for coronary artery disease include diabetes mellitus, dyslipidemia, family history, obesity, male sex, hypertension, sedentary lifestyle and tobacco exposure. Current diabetic treatment includes oral agent (triple therapy).  His weight is increasing steadily. He is following a generally unhealthy diet. When asked about meal planning, he reported none. He has not had a previous visit with a dietitian. He rarely participates in exercise. His home blood glucose trend is increasing steadily. (He did not bring any logs nor meter with him.  His previsit labs show A1c of 7.6%, increasing from his last visit A1c.) An ACE inhibitor/angiotensin II receptor blocker is being taken.  Hyperlipidemia This is a chronic problem. The current episode started more than 1 year ago. The problem is uncontrolled. Recent lipid tests were reviewed and are high. Exacerbating diseases include diabetes and obesity. Pertinent negatives include no chest pain, myalgias or shortness of breath. Current antihyperlipidemic treatment includes statins. Risk factors for coronary artery disease include dyslipidemia, diabetes mellitus, family history, hypertension, male sex, obesity and a sedentary lifestyle.  Hypertension This is a chronic problem. The current episode started more than 1 year ago. Pertinent negatives include no chest pain, headaches, neck pain, palpitations or shortness of breath. Risk factors for coronary artery disease include diabetes mellitus, dyslipidemia, male gender, obesity, sedentary lifestyle, family history and smoking/tobacco exposure. Past treatments include ACE inhibitors.    Review of systems  Constitutional: + Minimally fluctuating body weight,  current  Body mass index is 36.29 kg/m.  , no fatigue, no subjective hyperthermia, no subjective hypothermia Eyes: no blurry vision, no xerophthalmia ENT: no sore throat, no nodules palpated in throat, no dysphagia/odynophagia, no hoarseness Cardiovascular: no Chest Pain, no Shortness of Breath, no palpitations, no leg swelling Respiratory: no cough, no shortness of breath Gastrointestinal: no Nausea/Vomiting/Diarhhea Musculoskeletal: no muscle/joint aches Skin: no rashes, no hyperemia Neurological: no tremors, no numbness, no tingling, no dizziness Psychiatric: no depression, no anxiety   Objective:    BP 120/77   Pulse 70   Ht 5' 11"  (1.803 m)   Wt 260 lb 3.2 oz (118 kg)   BMI 36.29 kg/m   Wt Readings from Last 3 Encounters:  05/17/19 260 lb 3.2 oz (118 kg)  01/16/19 257 lb (116.6 kg)  01/16/19 257 lb 9.6 oz (116.8 kg)     Physical Exam- Limited  Constitutional:  Body mass index is 36.29 kg/m. , not in acute distress, normal state of mind Eyes:  EOMI, no exophthalmos Neck: Supple Thyroid: No gross goiter Respiratory: Adequate breathing efforts Musculoskeletal: + Left foot in the cast after recent injury,  strength intact in all four extremities, no gross restriction of joint movements Skin:  no rashes, no hyperemia Neurological: no tremor with outstretched hands,    CMP ( most recent) CMP     Component Value Date/Time   NA 135 05/08/2019 0814   K 4.4 05/08/2019 0814   CL 100 05/08/2019 0814   CO2 21 05/08/2019 0814   GLUCOSE 140 (H) 05/08/2019 0814   GLUCOSE 208 (H) 04/03/2015 1500   BUN 18 05/08/2019 0814   CREATININE 1.05 05/08/2019 0814   CALCIUM 9.7 05/08/2019 0814   PROT 7.0 05/08/2019 0814   ALBUMIN 4.5 05/08/2019 0814   AST 22 05/08/2019 0814   ALT 22 05/08/2019 0814   ALKPHOS 78 05/08/2019 0814   BILITOT 0.5 05/08/2019 0814   GFRNONAA 81 05/08/2019 0814   GFRAA 93 05/08/2019 0814   Diabetic Labs (most recent): Lab Results  Component Value Date   HGBA1C 7.6 (H) 05/08/2019   HGBA1C  7.2 (A) 01/16/2019   HGBA1C 7.9 (H) 09/06/2018     Lipid Panel ( most recent) Lipid Panel  Component Value Date/Time   CHOL 152 05/08/2019 0814   TRIG 188 (H) 05/08/2019 0814   HDL 39 (L) 05/08/2019 0814   CHOLHDL 3.9 05/08/2019 0814   LDLCALC 81 05/08/2019 0814   LABVLDL 32 05/08/2019 0814     Lab Results  Component Value Date   TSH 2.010 05/08/2019   TSH 2.13 02/07/2018   TSH 3.510 09/16/2016   TSH 1.270 09/11/2014   FREET4 1.48 05/08/2019     Assessment & Plan:   1. Uncontrolled type 2 diabetes mellitus with hyperglycemia ([place])  - [name] has currently uncontrolled symptomatic type 2 DM since  54 years of age. -He denies any hypoglycemia.  He did not bring any logs nor meter with him.  His previsit labs show A1c of 7.6%, increasing from his last visit A1c of 7.2%.   - I had a long discussion with him about the progressive nature of diabetes and the pathology behind its complications. -his diabetes is complicated by obesity/sedentary life, history of smoking and he remains at a high risk for more acute and chronic complications which include CAD, CVA, CKD, retinopathy, and neuropathy. These are all discussed in detail with him.  - I have counseled him on diet  and weight management  by adopting a carbohydrate restricted/protein rich diet. Patient is encouraged to switch to  unprocessed or minimally processed     complex starch and increased protein intake (animal or plant source), fruits, and vegetables. -  he is advised to stick to a routine mealtimes to eat 3 meals  a day and avoid unnecessary snacks ( to snack only to correct hypoglycemia).   - he  admits there is a room for improvement in his diet and drink choices. -  Suggestion is made for him to avoid simple carbohydrates  from his diet including Cakes, Sweet Desserts / Pastries, Ice Cream, Soda (diet and regular), Sweet Tea, Candies, Chips, Cookies, Sweet Pastries,  Store Bought Juices, Alcohol in Excess  of  1-2 drinks a day, Artificial Sweeteners, Coffee Creamer, and "Sugar-free" Products. This will help patient to have stable blood glucose profile and potentially avoid unintended weight gain.   - he will be scheduled with Jearld Fenton, RDN, CDE for diabetes education.  - I have approached him with the following individualized plan to manage  his diabetes and patient agrees:   -Based on his presentation with increased A1c, he was approached for additional treatment with weekly GLP-1 receptor agonist, patient declined.   -He agrees to continue to monitor blood glucose at least 1 time a day before breakfast. - he is encouraged to call clinic for blood glucose levels less than 70 or above 200 mg /dl. - he is advised to continue Metformin 1000 mg p.o. twice daily--after breakfast and after supper, and glipizide  10 mg XL p.o. daily at breakfast.    - Specific targets for  A1c;  LDL, HDL, Triglycerides, were discussed with the patient.  2) Blood Pressure /Hypertension:  -His blood pressure is controlled to target.  he is advised to continue his current medications including lisinopril 20 mg p.o. daily with breakfast .  3) Lipids/Hyperlipidemia:   Review of his recent lipid panel showed  controlled  LDL at 81.   however uncontrolled hypertriglyceridemia at 188.  he  is advised to continue simvastatin 40 mg p.o. daily at bedtime.   Side effects and precautions discussed with him.  He has documented intolerance to fenofibrate causing rash.  He is advised to  cut back on butter and fried food consumption.  4)  Weight/Diet: His BMI is 36.29--   clearly complicating his diabetes care.  He is a candidate for modest weight loss.  I discussed with him the fact that loss of 5 - 10% of his  current body weight will have the most impact on his diabetes management.  Exercise, and detailed carbohydrates information provided  -  detailed on discharge instructions.  5) Chronic Care/Health Maintenance:  -he  is  on ACEI/ARB and Statin medications and  is encouraged to initiate and continue to follow up with Ophthalmology, Dentist,  Podiatrist at least yearly or according to recommendations, and advised to   stay away from smoking.  He is safe he does not believe in flu shots,  I have recommended yearly flu vaccine and pneumonia vaccine at least every 5 years; moderate intensity exercise for up to 150 minutes weekly; and  sleep for at least 7 hours a day.  - he is  advised to maintain close follow up with Dettinger, Fransisca Kaufmann, MD for primary care needs, as well as his other providers for optimal and coordinated care.   - Time spent on this patient care encounter:  35 min, of which > 50% was spent in  counseling and the rest reviewing his blood glucose logs , discussing his hypoglycemia and hyperglycemia episodes, reviewing his current and  previous labs / studies  ( including abstraction from other facilities) and medications  doses and developing a  long term treatment plan and documenting his care.   Please refer to Patient Instructions for Blood Glucose Monitoring and Insulin/Medications Dosing Guide"  in media tab for additional information. Please  also refer to " Patient Self Inventory" in the Media  tab for reviewed elements of pertinent patient history.  [name] participated in the discussions, expressed understanding, and voiced agreement with the above plans.  All questions were answered to his satisfaction. he is encouraged to contact clinic should he have any questions or concerns prior to his return visit.    Follow up plan: - Return in about 4 months (around 09/17/2019) for Bring Meter and Logs- A1c in Office.  Glade Lloyd, MD [facility] [address] [place], [place] [address] Phone: [phone]  Fax: [phone]    05/17/2019, 2:30 PM  This note was partially dictated with voice recognition software. Similar sounding words  can be transcribed inadequately or may not  be corrected upon review.

## 2019-05-17 NOTE — Patient Instructions (Signed)

## 2019-05-24 ENCOUNTER — Other Ambulatory Visit: Payer: Self-pay | Admitting: Orthopaedic Surgery

## 2019-05-24 DIAGNOSIS — G5602 Carpal tunnel syndrome, left upper limb: Secondary | ICD-10-CM | POA: Diagnosis not present

## 2019-05-31 ENCOUNTER — Ambulatory Visit (INDEPENDENT_AMBULATORY_CARE_PROVIDER_SITE_OTHER): Payer: Medicare HMO | Admitting: Family Medicine

## 2019-05-31 ENCOUNTER — Encounter: Payer: Self-pay | Admitting: Physician Assistant

## 2019-05-31 ENCOUNTER — Encounter: Payer: Self-pay | Admitting: Family Medicine

## 2019-05-31 ENCOUNTER — Other Ambulatory Visit: Payer: Self-pay

## 2019-05-31 ENCOUNTER — Other Ambulatory Visit: Payer: Self-pay | Admitting: Family Medicine

## 2019-05-31 ENCOUNTER — Ambulatory Visit (INDEPENDENT_AMBULATORY_CARE_PROVIDER_SITE_OTHER): Payer: Medicare HMO | Admitting: Physician Assistant

## 2019-05-31 VITALS — BP 123/79 | HR 83 | Temp 97.2°F | Ht 73.0 in | Wt 259.0 lb

## 2019-05-31 DIAGNOSIS — G8929 Other chronic pain: Secondary | ICD-10-CM | POA: Diagnosis not present

## 2019-05-31 DIAGNOSIS — M79671 Pain in right foot: Secondary | ICD-10-CM | POA: Diagnosis not present

## 2019-05-31 DIAGNOSIS — E1165 Type 2 diabetes mellitus with hyperglycemia: Secondary | ICD-10-CM | POA: Diagnosis not present

## 2019-05-31 DIAGNOSIS — L97412 Non-pressure chronic ulcer of right heel and midfoot with fat layer exposed: Secondary | ICD-10-CM | POA: Diagnosis not present

## 2019-05-31 DIAGNOSIS — I10 Essential (primary) hypertension: Secondary | ICD-10-CM

## 2019-05-31 DIAGNOSIS — G5602 Carpal tunnel syndrome, left upper limb: Secondary | ICD-10-CM

## 2019-05-31 DIAGNOSIS — E785 Hyperlipidemia, unspecified: Secondary | ICD-10-CM | POA: Diagnosis not present

## 2019-05-31 DIAGNOSIS — E11621 Type 2 diabetes mellitus with foot ulcer: Secondary | ICD-10-CM | POA: Diagnosis not present

## 2019-05-31 DIAGNOSIS — E1169 Type 2 diabetes mellitus with other specified complication: Secondary | ICD-10-CM | POA: Diagnosis not present

## 2019-05-31 DIAGNOSIS — E1159 Type 2 diabetes mellitus with other circulatory complications: Secondary | ICD-10-CM | POA: Diagnosis not present

## 2019-05-31 MED ORDER — OXYCODONE HCL 10 MG PO TABS: 10.0000 mg | ORAL_TABLET | Freq: Four times a day (QID) | ORAL | 0 refills | Status: DC | PRN

## 2019-05-31 NOTE — Progress Notes (Signed)
BP 123/79   Pulse 83   Temp (!) 97.2 F (36.2 C)   Ht _0  (1.854 m)   Wt 259 lb (117.5 kg)   SpO2 97%   BMI 34.17 kg/m    Subjective:   Patient ID: Timothy Bouche., male    DOB: 19-Apr-1965, 54 y.o.   MRN: [id number]  HPI: Timothy Tabb. is a 54 y.o. male presenting on 05/31/2019 for Medical Management of Chronic Issues and Diabetes   HPI Pain assessment: Cause of pain-bilateral foot and ankle pain, chronic deformity due to trauma in his history Pain location-bilateral foot and ankle Pain on scale of 1-10-5 Frequency-Daily What increases pain-walking or prolonged standing What makes pain Better-pain medication and special shoes and inserts that help Effects on ADL -limits some ability for chronic walking Any change in general medical condition-none  Current opioids rx-oxycodone 10 mg 4 times daily as needed # meds rx-120 Effectiveness of current meds-works well Adverse reactions form pain meds-none Morphine equivalent-60  Pill count performed-No Last drug screen -03/22/2019 ( high risk q69m moderate risk q656mlow risk yearly ) Urine drug screen today- No Was the [place]-yes  If yes were their any concerning findings? -None    No flowsheet data found.   Pain contract signed on: 03/22/19  Relevant past medical, surgical, family and social history reviewed and updated as indicated. Interim medical history since our last visit reviewed. Allergies and medications reviewed and updated.  Review of Systems  Constitutional: Negative for chills and fever.  Respiratory: Negative for shortness of breath and wheezing.   Cardiovascular: Negative for chest pain and leg swelling.  Musculoskeletal: Positive for arthralgias. Negative for back pain and gait problem.  Skin: Negative for rash.  All other systems reviewed and are negative.   Per HPI unless specifically indicated above   Allergies as of 05/31/2019      Reactions   Fenofibrate Rash   Permament rash.       Medication List       Accurate as of May 31, 2019  8:45 AM. If you have any questions, ask your nurse or doctor.        Accu-Chek Aviva Plus test strip Generic drug: glucose blood 1 each by Other route 4 (four) times daily. Use as instructed   Accu-Chek Aviva Plus w/Device Kit 1 each by Does not apply route 4 (four) times daily.   accu-chek softclix lancets Use as instructed   Accu-Chek Softclix Lancets lancets   CINNAMON PO Take 1 capsule by mouth 2 (two) times daily.   Fish Oil 1200 MG Cpdr Take 1,200 mg by mouth daily.   fluticasone 50 MCG/ACT nasal spray Commonly known as: FLONASE Place 1 spray into both nostrils 2 (two) times daily as needed for allergies or rhinitis.   Fluzone Quadrivalent 0.5 ML injection Generic drug: influenza vac split quadrivalent PF   glipiZIDE 10 MG 24 hr tablet Commonly known as: GLUCOTROL XL Take 1 tablet (10 mg total) by mouth daily with breakfast.   ketoconazole 2 % cream Commonly known as: NIZORAL Apply 1 application topically daily.   lisinopril 20 MG tablet Commonly known as: ZESTRIL Take 1 tablet (20 mg total) by mouth daily.   metFORMIN 1000 MG tablet Commonly known as: GLUCOPHAGE Take 1 tablet (1,000 mg total) by mouth 2 (two) times daily with a meal.   Na Sulfate-K Sulfate-Mg Sulf 17.5-3.13-1.6 GM/177ML Soln Take 1 kit by mouth as directed.   Oxycodone HCl 10 MG Tabs  Take 1 tablet (10 mg total) by mouth 4 (four) times daily as needed. What changed: Another medication with the same name was changed. Make sure you understand how and when to take each. Changed by: Fransisca Kaufmann Mishael Krysiak, MD   Oxycodone HCl 10 MG Tabs Take 1 tablet (10 mg total) by mouth 4 (four) times daily as needed. Start taking on: June 30, 2019 What changed: These instructions start on June 30, 2019. If you are unsure what to do until then, ask your doctor or other care provider. Changed by: Fransisca Kaufmann Jehieli Brassell, MD   Oxycodone HCl 10 MG  Tabs Take 1 tablet (10 mg total) by mouth 4 (four) times daily as needed. Start taking on: July 30, 2019 What changed: These instructions start on July 30, 2019. If you are unsure what to do until then, ask your doctor or other care provider. Changed by: Fransisca Kaufmann Jetta Murray, MD   pantoprazole 40 MG tablet Commonly known as: PROTONIX Take 40 mg by mouth daily.   prednisoLONE acetate 1 % ophthalmic suspension Commonly known as: PRED FORTE   Restasis 0.05 % ophthalmic emulsion Generic drug: cycloSPORINE SMARTSIG:1 Drop(s) In Eye(s) Every 12 Hours   simvastatin 40 MG tablet Commonly known as: ZOCOR Take 1 tablet (40 mg total) by mouth daily at 6 PM.   triamcinolone cream 0.1 % Commonly known as: KENALOG Apply 1 application topically 2 (two) times daily.        Objective:   BP 123/79   Pulse 83   Temp (!) 97.2 F (36.2 C)   Ht _0  (1.854 m)   Wt 259 lb (117.5 kg)   SpO2 97%   BMI 34.17 kg/m   Wt Readings from Last 3 Encounters:  05/31/19 259 lb (117.5 kg)  05/17/19 260 lb 3.2 oz (118 kg)  01/16/19 257 lb (116.6 kg)    Physical Exam Vitals and nursing note reviewed.  Constitutional:      General: He is not in acute distress.    Appearance: He is well-developed. He is not diaphoretic.  Eyes:     General: No scleral icterus.    Conjunctiva/sclera: Conjunctivae normal.  Neck:     Thyroid: No thyromegaly.  Cardiovascular:     Rate and Rhythm: Normal rate and regular rhythm.     Heart sounds: Normal heart sounds. No murmur.  Pulmonary:     Effort: Pulmonary effort is normal. No respiratory distress.     Breath sounds: Normal breath sounds. No wheezing.  Musculoskeletal:     Cervical back: Neck supple.  Lymphadenopathy:     Cervical: No cervical adenopathy.  Skin:    General: Skin is warm and dry.     Findings: No rash.  Neurological:     Mental Status: He is alert and oriented to person, place, and time.     Coordination: Coordination normal.  Psychiatric:         Behavior: Behavior normal.       Assessment & Plan:   Problem List Items Addressed This Visit      Cardiovascular and Mediastinum   Hypertension associated with diabetes ([place])     Endocrine   Uncontrolled type 2 diabetes mellitus with hyperglycemia ([place]) - Primary   Hyperlipidemia associated with type 2 diabetes mellitus ([place])   Diabetic ulcer of right midfoot associated with type 2 diabetes mellitus, with fat layer exposed ([place])     Other   Chronic foot pain   Relevant Medications   Oxycodone HCl 10  MG TABS   Oxycodone HCl 10 MG TABS (Start on 06/30/2019)   Oxycodone HCl 10 MG TABS (Start on 07/30/2019)      Continue pain medication, he follows up with Dr. Dorien Chihuahua for diabetes and just had blood work done earlier this month Follow up plan: Return in about 3 months (around 08/31/2019), or if symptoms worsen or fail to improve, for Pain management and hypertension.  Counseling provided for all of the vaccine components No orders of the defined types were placed in this encounter.   Caryl Pina, MD [facility] 05/31/2019, 8:45 AM

## 2019-05-31 NOTE — Progress Notes (Signed)
HPI: Timothy Nelson returns today 1 week status status post left carpal tunnel release.  He is overall doing well.  No drainage.  Sutures are intact.  He states the numbness and injury resolving only present illness left arm at this point in time.  Physical exam: Left hand full motor subjective decreased sensation left thumb.Sensation intact to light touch.  Surgical incisions well approximated with nylon sutures is not ready for suture removal at this point time.  Impression: Status post left carpal tunnel release 05/24/2019  Plan: We will see him back in a week for suture removal.  He will continue to wash the hand for hygiene purposes not submerging it.  Questions encouraged and answered.

## 2019-06-11 ENCOUNTER — Other Ambulatory Visit: Payer: Self-pay

## 2019-06-11 ENCOUNTER — Ambulatory Visit (INDEPENDENT_AMBULATORY_CARE_PROVIDER_SITE_OTHER): Payer: Medicare HMO | Admitting: Physician Assistant

## 2019-06-11 ENCOUNTER — Encounter: Payer: Self-pay | Admitting: Physician Assistant

## 2019-06-11 VITALS — Ht 73.0 in | Wt 255.0 lb

## 2019-06-11 DIAGNOSIS — G5602 Carpal tunnel syndrome, left upper limb: Secondary | ICD-10-CM

## 2019-06-11 NOTE — Progress Notes (Signed)
HPI: Mr. Lemen returns today status post left carpal tunnel release 05/24/2019.  He is overall doing well.  He has had no drainage.  Slight dehiscence of the wound.  States he only has numbness and.  Physical exam: Left hand sutures intact.  Slight dehiscence of the wound but no drainage.  No active bleeding.  No signs of infection.  Impression: Status post left carpal tunnel release 05/24/2019  Plan: Sutures removed Steri-Strips applied.  Scar tissue mobilization.  Discussed with him that he should not submerge the hand in water but he can wash the hand for hygiene purposes.  Follow-up with [place] in 3 weeks sooner if there is any questions concerns.

## 2019-06-20 DIAGNOSIS — K2270 Barrett's esophagus without dysplasia: Secondary | ICD-10-CM | POA: Diagnosis not present

## 2019-06-20 DIAGNOSIS — Z9229 Personal history of other drug therapy: Secondary | ICD-10-CM | POA: Diagnosis not present

## 2019-06-21 DIAGNOSIS — H179 Unspecified corneal scar and opacity: Secondary | ICD-10-CM | POA: Diagnosis not present

## 2019-06-28 ENCOUNTER — Encounter: Payer: Self-pay | Admitting: Physician Assistant

## 2019-06-28 ENCOUNTER — Other Ambulatory Visit: Payer: Self-pay

## 2019-06-28 ENCOUNTER — Ambulatory Visit (INDEPENDENT_AMBULATORY_CARE_PROVIDER_SITE_OTHER): Payer: Medicare HMO | Admitting: Physician Assistant

## 2019-06-28 DIAGNOSIS — G5602 Carpal tunnel syndrome, left upper limb: Secondary | ICD-10-CM

## 2019-06-28 NOTE — Progress Notes (Signed)
HPI: Timothy Nelson returns today follow-up of his left carpal tunnel release.  He states he has some tenderness but no numbness tingling.  His incision is healing well.  He has no complaints.  Physical exam: Left hand carpal tunnel incision is healed with completely.  There is no signs of infection wound dehiscence.  He has subjective full sensation to light touch throughout left hand.  Full motor left hand.  Impression status post left carpal tunnel release 05/24/2019  Plan: He will continue work on scar tissue mobilization.  Follow-up with [place] on as-needed basis.  Questions encouraged and

## 2019-07-11 DIAGNOSIS — K2270 Barrett's esophagus without dysplasia: Secondary | ICD-10-CM | POA: Diagnosis not present

## 2019-07-11 DIAGNOSIS — Z01818 Encounter for other preprocedural examination: Secondary | ICD-10-CM | POA: Diagnosis not present

## 2019-07-11 DIAGNOSIS — Z1211 Encounter for screening for malignant neoplasm of colon: Secondary | ICD-10-CM | POA: Diagnosis not present

## 2019-07-11 DIAGNOSIS — E119 Type 2 diabetes mellitus without complications: Secondary | ICD-10-CM | POA: Diagnosis not present

## 2019-07-13 DIAGNOSIS — K2270 Barrett's esophagus without dysplasia: Secondary | ICD-10-CM | POA: Diagnosis not present

## 2019-07-13 DIAGNOSIS — A048 Other specified bacterial intestinal infections: Secondary | ICD-10-CM | POA: Diagnosis not present

## 2019-07-13 DIAGNOSIS — K2970 Gastritis, unspecified, without bleeding: Secondary | ICD-10-CM | POA: Diagnosis not present

## 2019-08-15 DIAGNOSIS — E1142 Type 2 diabetes mellitus with diabetic polyneuropathy: Secondary | ICD-10-CM | POA: Diagnosis not present

## 2019-08-15 DIAGNOSIS — L602 Onychogryphosis: Secondary | ICD-10-CM | POA: Diagnosis not present

## 2019-08-15 DIAGNOSIS — L859 Epidermal thickening, unspecified: Secondary | ICD-10-CM | POA: Diagnosis not present

## 2019-09-03 ENCOUNTER — Ambulatory Visit (INDEPENDENT_AMBULATORY_CARE_PROVIDER_SITE_OTHER): Payer: Medicare HMO | Admitting: Family Medicine

## 2019-09-03 ENCOUNTER — Other Ambulatory Visit: Payer: Self-pay

## 2019-09-03 ENCOUNTER — Encounter: Payer: Self-pay | Admitting: Family Medicine

## 2019-09-03 VITALS — BP 130/77 | HR 74 | Temp 97.6°F | Ht 73.0 in | Wt 264.5 lb

## 2019-09-03 DIAGNOSIS — E1169 Type 2 diabetes mellitus with other specified complication: Secondary | ICD-10-CM | POA: Diagnosis not present

## 2019-09-03 DIAGNOSIS — E785 Hyperlipidemia, unspecified: Secondary | ICD-10-CM | POA: Diagnosis not present

## 2019-09-03 DIAGNOSIS — M79671 Pain in right foot: Secondary | ICD-10-CM | POA: Diagnosis not present

## 2019-09-03 DIAGNOSIS — I10 Essential (primary) hypertension: Secondary | ICD-10-CM | POA: Diagnosis not present

## 2019-09-03 DIAGNOSIS — E1159 Type 2 diabetes mellitus with other circulatory complications: Secondary | ICD-10-CM | POA: Diagnosis not present

## 2019-09-03 DIAGNOSIS — G8929 Other chronic pain: Secondary | ICD-10-CM

## 2019-09-03 DIAGNOSIS — I152 Hypertension secondary to endocrine disorders: Secondary | ICD-10-CM

## 2019-09-03 DIAGNOSIS — E1165 Type 2 diabetes mellitus with hyperglycemia: Secondary | ICD-10-CM | POA: Diagnosis not present

## 2019-09-03 DIAGNOSIS — K219 Gastro-esophageal reflux disease without esophagitis: Secondary | ICD-10-CM | POA: Diagnosis not present

## 2019-09-03 LAB — BMP8+EGFR
BUN/Creatinine Ratio: 15 (ref 9–20)
BUN: 17 mg/dL (ref 6–24)
CO2: 22 mmol/L (ref 20–29)
Calcium: 9.8 mg/dL (ref 8.7–10.2)
Chloride: 98 mmol/L (ref 96–106)
Creatinine, Ser: 1.13 mg/dL (ref 0.76–1.27)
GFR calc Af Amer: 85 mL/min/{1.73_m2} (ref >59)
GFR calc non Af Amer: 73 mL/min/{1.73_m2} (ref >59)
Glucose: 210 mg/dL — ABNORMAL HIGH (ref 65–99)
Potassium: 4.7 mmol/L (ref 3.5–5.2)
Sodium: 136 mmol/L (ref 134–144)

## 2019-09-03 LAB — BAYER DCA HB A1C WAIVED: HB A1C (BAYER DCA - WAIVED): 7.5 % — ABNORMAL HIGH (ref <7.0)

## 2019-09-03 MED ORDER — PANTOPRAZOLE SODIUM 40 MG PO TBEC
40.0000 mg | DELAYED_RELEASE_TABLET | Freq: Every day | ORAL | 3 refills | Status: DC
Start: 2019-09-03 — End: 2020-06-04

## 2019-09-03 MED ORDER — OXYCODONE HCL 10 MG PO TABS: 10.0000 mg | ORAL_TABLET | Freq: Four times a day (QID) | ORAL | 0 refills | Status: DC | PRN

## 2019-09-03 MED ORDER — OXYCODONE HCL 10 MG PO TABS
10.0000 mg | ORAL_TABLET | Freq: Four times a day (QID) | ORAL | 0 refills | Status: DC | PRN
Start: 2019-11-02 — End: 2019-09-20

## 2019-09-03 NOTE — Progress Notes (Signed)
BP 130/77   Pulse 74   Temp 97.6 F (36.4 C)   Ht 6' 1" (1.854 m)   Wt 264 lb 8 oz (120 kg)   SpO2 97%   BMI 34.90 kg/m    Subjective:   Patient ID: Timothy Nelson., male    DOB: 1965-11-11, 54 y.o.   MRN: [id number]  HPI: Timothy Nelson. is a 54 y.o. male presenting on 09/03/2019 for Medical Management of Chronic Issues, Diabetes, and Hypertension   HPI Type 2 diabetes mellitus Patient comes in today for recheck of his diabetes. Patient has been currently taking glipizide and Metformin A1c is 7.5, he started to feel sluggish because his sugars are up again.. Patient is currently on an ACE inhibitor/ARB. Patient has seen an ophthalmologist this year. Patient denies any issues with their feet. The symptom started onset as an adult hypertension hyperlipidemia ARE RELATED TO DM, patient also sees endocrinology and they had DC'd his Jardiance and wanted him to start Trulicity, gave a sample for Trulicity today  Pain assessment: Cause of pain-bilateral feet and ankle deformity due to trauma and history Pain location-both feet and ankles Pain on scale of 1-10- 5 Frequency-Daily What increases pain-being on his feet at all or walking at all What makes pain Better-rest and pain medication Effects on ADL -does limit his ability to walk some but he does try to keep as active as he can Any change in general medical condition-none  Current opioids rx-oxycodone 10- 4 times daily as needed # meds rx-120 Effectiveness of current meds-working well Adverse reactions form pain meds-none Morphine equivalent-60  Pill count performed-No Last drug screen -03/22/2019 ( high risk q25m moderate risk q612mlow risk yearly ) Urine drug screen today- No [place]  If yes were their any concerning findings? -None    No flowsheet data found.   Pain contract signed on: 03/22/19  Hypertension Patient is currently on lisinopril, and their blood pressure today is 130/77. Patient  denies any lightheadedness or dizziness. Patient denies headaches, blurred vision, chest pains, shortness of breath, or weakness. Denies any side effects from medication and is content with current medication.   Hyperlipidemia Patient is coming in for recheck of his hyperlipidemia. The patient is currently taking simvastatin. They deny any issues with myalgias or history of liver damage from it. They deny any focal numbness or weakness or chest pain.   Relevant past medical, surgical, family and social history reviewed and updated as indicated. Interim medical history since our last visit reviewed. Allergies and medications reviewed and updated.  Review of Systems  Constitutional: Negative for chills and fever.  Eyes: Negative for discharge.  Respiratory: Negative for shortness of breath and wheezing.   Cardiovascular: Negative for chest pain and leg swelling.  Musculoskeletal: Positive for arthralgias. Negative for back pain and gait problem.  Skin: Negative for rash.  Neurological: Negative for dizziness, weakness and numbness.  All other systems reviewed and are negative.   Per HPI unless specifically indicated above   Allergies as of 09/03/2019      Reactions   Fenofibrate Rash   Permament rash.      Medication List       Accurate as of September 03, 2019  8:32 AM. If you have any questions, ask your nurse or doctor.        STOP taking these medications   accu-chek softclix lancets Stopped by: JoFransisca Kaufmannettinger, MD   Na Sulfate-K Sulfate-Mg Sulf 17.5-3.13-1.6 GM/177ML  Soln Stopped by: Worthy Rancher, MD     TAKE these medications   Accu-Chek Aviva Plus test strip Generic drug: glucose blood 1 each by Other route 4 (four) times daily. Use as instructed   Accu-Chek Aviva Plus w/Device Kit 1 each by Does not apply route 4 (four) times daily.   Accu-Chek Softclix Lancets lancets   CINNAMON PO Take 1 capsule by mouth 2 (two) times daily.   Fish Oil 1200 MG  Cpdr Take 1,200 mg by mouth daily.   fluticasone 50 MCG/ACT nasal spray Commonly known as: FLONASE Place 1 spray into both nostrils 2 (two) times daily as needed for allergies or rhinitis.   Fluzone Quadrivalent 0.5 ML injection Generic drug: influenza vac split quadrivalent PF   glipiZIDE 10 MG 24 hr tablet Commonly known as: GLUCOTROL XL Take 1 tablet (10 mg total) by mouth daily with breakfast.   ketoconazole 2 % cream Commonly known as: NIZORAL APPLY 1 APPLICATION TOPICALLY DAILY.   lisinopril 20 MG tablet Commonly known as: ZESTRIL Take 1 tablet (20 mg total) by mouth daily.   metFORMIN 1000 MG tablet Commonly known as: GLUCOPHAGE Take 1 tablet (1,000 mg total) by mouth 2 (two) times daily with a meal.   Oxycodone HCl 10 MG Tabs Take 1 tablet (10 mg total) by mouth 4 (four) times daily as needed.   Oxycodone HCl 10 MG Tabs Take 1 tablet (10 mg total) by mouth 4 (four) times daily as needed.   Oxycodone HCl 10 MG Tabs Take 1 tablet (10 mg total) by mouth 4 (four) times daily as needed.   pantoprazole 40 MG tablet Commonly known as: PROTONIX Take 40 mg by mouth daily.   Restasis 0.05 % ophthalmic emulsion Generic drug: cycloSPORINE SMARTSIG:1 Drop(s) In Eye(s) Every 12 Hours   simvastatin 40 MG tablet Commonly known as: ZOCOR Take 1 tablet (40 mg total) by mouth daily at 6 PM.   triamcinolone cream 0.1 % Commonly known as: KENALOG Apply 1 application topically 2 (two) times daily.        Objective:   BP 130/77   Pulse 74   Temp 97.6 F (36.4 C)   Ht 6' 1" (1.854 m)   Wt 264 lb 8 oz (120 kg)   SpO2 97%   BMI 34.90 kg/m   Wt Readings from Last 3 Encounters:  09/03/19 264 lb 8 oz (120 kg)  06/11/19 255 lb (115.7 kg)  05/31/19 259 lb (117.5 kg)    Physical Exam Vitals and nursing note reviewed.  Constitutional:      General: He is not in acute distress.    Appearance: He is well-developed. He is not diaphoretic.  Eyes:     General: No  scleral icterus.    Conjunctiva/sclera: Conjunctivae normal.  Neck:     Thyroid: No thyromegaly.  Cardiovascular:     Rate and Rhythm: Normal rate and regular rhythm.     Heart sounds: Normal heart sounds. No murmur heard.   Pulmonary:     Effort: Pulmonary effort is normal. No respiratory distress.     Breath sounds: Normal breath sounds. No wheezing.  Musculoskeletal:        General: Normal range of motion.     Cervical back: Neck supple.     Right ankle: Deformity present. Tenderness present.     Left ankle: Deformity present. Tenderness present.  Lymphadenopathy:     Cervical: No cervical adenopathy.  Skin:    General: Skin is warm and dry.  Findings: No rash.  Neurological:     Mental Status: He is alert and oriented to person, place, and time.     Coordination: Coordination normal.  Psychiatric:        Behavior: Behavior normal.       Assessment & Plan:   Problem List Items Addressed This Visit      Cardiovascular and Mediastinum   Hypertension associated with diabetes ([place])   Relevant Orders   BMP8+EGFR     Digestive   GERD (gastroesophageal reflux disease)   Relevant Medications   pantoprazole (PROTONIX) 40 MG tablet     Endocrine   Uncontrolled type 2 diabetes mellitus with hyperglycemia (HCC) - Primary   Relevant Orders   Bayer DCA Hb A1c Waived   BMP8+EGFR   Hyperlipidemia associated with type 2 diabetes mellitus (HCC)   Diabetes mellitus, type 2 (HCC)   Relevant Orders   BMP8+EGFR     Other   Chronic foot pain   Relevant Medications   Oxycodone HCl 10 MG TABS   Oxycodone HCl 10 MG TABS (Start on 10/03/2019)   Oxycodone HCl 10 MG TABS (Start on 11/02/2019)      Gave sample for Trulicity 2.53 mg, has follow-up with endocrinology in 2 weeks, please send a copy results to endocrinology Follow up plan: Return in about 3 months (around 12/04/2019), or if symptoms worsen or fail to improve, for Pain management diabetes and  hypertension..  Counseling provided for all of the vaccine components Orders Placed This Encounter  Procedures  . Bayer [facility] Hb A1c [place], MD [facility] 09/03/2019, 8:32 AM

## 2019-09-20 ENCOUNTER — Telehealth (INDEPENDENT_AMBULATORY_CARE_PROVIDER_SITE_OTHER): Payer: Medicare HMO | Admitting: Nurse Practitioner

## 2019-09-20 ENCOUNTER — Encounter: Payer: Self-pay | Admitting: Nurse Practitioner

## 2019-09-20 ENCOUNTER — Other Ambulatory Visit: Payer: Self-pay

## 2019-09-20 ENCOUNTER — Encounter: Payer: Self-pay | Admitting: Nutrition

## 2019-09-20 ENCOUNTER — Encounter: Payer: Medicare HMO | Attending: "Endocrinology | Admitting: Nutrition

## 2019-09-20 VITALS — Wt 264.0 lb

## 2019-09-20 DIAGNOSIS — I10 Essential (primary) hypertension: Secondary | ICD-10-CM | POA: Insufficient documentation

## 2019-09-20 DIAGNOSIS — E1165 Type 2 diabetes mellitus with hyperglycemia: Secondary | ICD-10-CM | POA: Diagnosis not present

## 2019-09-20 DIAGNOSIS — E118 Type 2 diabetes mellitus with unspecified complications: Secondary | ICD-10-CM | POA: Diagnosis not present

## 2019-09-20 DIAGNOSIS — E782 Mixed hyperlipidemia: Secondary | ICD-10-CM

## 2019-09-20 DIAGNOSIS — E669 Obesity, unspecified: Secondary | ICD-10-CM

## 2019-09-20 DIAGNOSIS — IMO0002 Reserved for concepts with insufficient information to code with codable children: Secondary | ICD-10-CM

## 2019-09-20 MED ORDER — TRULICITY 1.5 MG/0.5ML ~~LOC~~ SOAJ: 1.5000 mg | SUBCUTANEOUS | 3 refills | Status: DC

## 2019-09-20 NOTE — Progress Notes (Signed)
09/20/2019, 8:41 AM   Endocrinology follow-up note  TELEHEALTH VISIT: The patient is being engaged in telehealth visit due to COVID-19.  This type of visit limits physical examination significantly, and thus is not preferable over face-to-face encounters.  I connected with  Timothy Nelson. on 09/20/19 by a video enabled telemedicine application and verified that I am speaking with the correct person using two identifiers.   I discussed the limitations of evaluation and management by telemedicine. The patient expressed understanding and agreed to proceed.    The participants involved in this visit include: Brita Romp, NP located at [facility] and Timothy Nelson.  located at their personal residence listed.   Subjective:    Patient ID: Timothy Nelson., male    DOB: 12/17/1965.  Timothy Nelson. is being seen in follow-up after he was seen in consultation for management of currently uncontrolled symptomatic diabetes requested by  Dettinger, Fransisca Kaufmann, MD.   Past Medical History:  Diagnosis Date  . Arthritis   . Barrett's esophagus   . Chronic pain   . Diabetes ([place])   . Essential hypertension   . Gastroparesis   . GERD (gastroesophageal reflux disease)   . Hyperlipidemia   . Obesity   . Type 2 diabetes mellitus ([place])     Past Surgical History:  Procedure Laterality Date  . ESOPHAGOGASTRODUODENOSCOPY (EGD) WITH PROPOFOL N/A 04/04/2015   Procedure: ESOPHAGOGASTRODUODENOSCOPY (EGD) WITH PROPOFOL;  Surgeon: Rogene Houston, MD;  Location: [facility];  Service: Endoscopy;  Laterality: N/A;  8:30  . FRACTURE SURGERY  March 2007   Left arm, left thumb, bilateral legs  . HIP SURGERY Right   . WRIST FRACTURE SURGERY Left     Social History   Socioeconomic History  . Marital status: Married    Spouse name: Not on file  . Number of children: 1  . Years of education: Not on file   . Highest education level: Associate degree: occupational, Hotel manager, or vocational program  Occupational History  . Occupation: Disability  Tobacco Use  . Smoking status: Current Some Day Smoker    Packs/day: 1.00    Years: 20.00    Pack years: 20.00    Types: Cigars  . Smokeless tobacco: Never Used  . Tobacco comment: Smoked intermittently for 20 years; 1-2 cigars weekly as of 24/2020  Vaping Use  . Vaping Use: Never used  Substance and Sexual Activity  . Alcohol use: Yes    Alcohol/week: 1.0 standard drink    Types: 1 Glasses of wine per week    Comment: glass wine a week.   . Drug use: No  . Sexual activity: Yes  Other Topics Concern  . Not on file  Social History Narrative  . Not on file   Social Determinants of Health   Financial Resource Strain:   . Difficulty of Paying Living Expenses: Not on file  Food Insecurity:   . Worried About Charity fundraiser in the Last Year: Not on file  . Ran Out of Food in the Last Year: Not on file  Transportation Needs:   . Lack of Transportation (Medical): Not on file  . Lack of  Transportation (Non-Medical): Not on file  Physical Activity:   . Days of Exercise per Week: Not on file  . Minutes of Exercise per Session: Not on file  Stress:   . Feeling of Stress : Not on file  Social Connections:   . Frequency of Communication with Friends and Family: Not on file  . Frequency of Social Gatherings with Friends and Family: Not on file  . Attends Religious Services: Not on file  . Active Member of Clubs or Organizations: Not on file  . Attends Archivist Meetings: Not on file  . Marital Status: Not on file    Family History  Problem Relation Age of Onset  . Diabetes Mother   . Hypertension Mother   . Hyperlipidemia Mother   . Arthritis Mother   . Esophageal cancer Mother        patient unsure  . Diabetes Father   . Heart disease Father        Age 27  . Kidney disease Father   . Hypertension Sister   .  Diabetes Daughter   . Colon cancer Neg Hx   . Stomach cancer Neg Hx   . Pancreatic cancer Neg Hx   . Liver disease Neg Hx     Outpatient Encounter Medications as of 09/20/2019  Medication Sig  . Accu-Chek Softclix Lancets lancets   . CINNAMON PO Take 1 capsule by mouth 2 (two) times daily.  . cycloSPORINE (RESTASIS) 0.05 % ophthalmic emulsion SMARTSIG:1 Drop(s) In Eye(s) Every 12 Hours  . fluticasone (FLONASE) 50 MCG/ACT nasal spray Place 1 spray into both nostrils 2 (two) times daily as needed for allergies or rhinitis.  Marland Kitchen glipiZIDE (GLUCOTROL XL) 10 MG 24 hr tablet Take 1 tablet (10 mg total) by mouth daily with breakfast.  . ketoconazole (NIZORAL) 2 % cream APPLY 1 APPLICATION TOPICALLY DAILY.  Marland Kitchen lisinopril (ZESTRIL) 20 MG tablet Take 1 tablet (20 mg total) by mouth daily.  . metFORMIN (GLUCOPHAGE) 1000 MG tablet Take 1 tablet (1,000 mg total) by mouth 2 (two) times daily with a meal.  . Omega-3 Fatty Acids (FISH OIL) 1200 MG CPDR Take 1,200 mg by mouth daily.  . Oxycodone HCl 10 MG TABS Take 1 tablet (10 mg total) by mouth 4 (four) times daily as needed.  . pantoprazole (PROTONIX) 40 MG tablet Take 1 tablet (40 mg total) by mouth daily.  . simvastatin (ZOCOR) 40 MG tablet Take 1 tablet (40 mg total) by mouth daily at 6 PM.  . triamcinolone cream (KENALOG) 0.1 % Apply 1 application topically 2 (two) times daily.  . [DISCONTINUED] Dulaglutide (TRULICITY) 5.37 SM/2.7MB SOPN Inject 0.75 mg into the skin once a week.  . Dulaglutide (TRULICITY) 1.5 EM/7.5QG SOPN Inject 1.5 mg into the skin once a week.  Marland Kitchen FLUZONE QUADRIVALENT 0.5 ML injection  (Patient not taking: Reported on 09/20/2019)  . [DISCONTINUED] Blood Glucose Monitoring Suppl (ACCU-CHEK AVIVA PLUS) w/Device KIT 1 each by Does not apply route 4 (four) times daily.  . [DISCONTINUED] glucose blood (ACCU-CHEK AVIVA PLUS) test strip 1 each by Other route 4 (four) times daily. Use as instructed  . [DISCONTINUED] Oxycodone HCl 10 MG TABS  Take 1 tablet (10 mg total) by mouth 4 (four) times daily as needed.  . [DISCONTINUED] Oxycodone HCl 10 MG TABS Take 1 tablet (10 mg total) by mouth 4 (four) times daily as needed.   No facility-administered encounter medications on file as of 09/20/2019.    ALLERGIES: Allergies  Allergen Reactions  .  Fenofibrate Rash    Permament rash.     VACCINATION STATUS: Immunization History  Administered Date(s) Administered  . Influenza,inj,Quad PF,6+ Mos 11/15/2018  . Influenza-Unspecified 09/04/2016  . Moderna SARS-COVID-2 Vaccination 03/13/2019, 04/10/2019  . Pneumococcal Polysaccharide-23 09/16/2016    Diabetes He presents for his follow-up diabetic visit. He has type 2 diabetes mellitus. Onset time: He was diagnosed at approximate age of 27 years. His disease course has been stable. There are no hypoglycemic associated symptoms. Pertinent negatives for hypoglycemia include no confusion, headaches, pallor or seizures. Pertinent negatives for diabetes include no chest pain, no fatigue, no polydipsia, no polyphagia, no polyuria and no weakness. There are no hypoglycemic complications. Symptoms are stable. There are no diabetic complications. Risk factors for coronary artery disease include diabetes mellitus, dyslipidemia, family history, obesity, male sex, hypertension, sedentary lifestyle and tobacco exposure. Current diabetic treatment includes oral agent (dual therapy). He is compliant with treatment most of the time. His weight is increasing steadily. He is following a generally unhealthy diet. When asked about meal planning, he reported none. He has not had a previous visit with a dietitian. He rarely participates in exercise. His home blood glucose trend is fluctuating minimally. (He presents for his virtual visit today without his meter or logs.  He seldomly checks his glucose at home.  His A1C on 09/03/19 was 7.5%, essentially unchanged from his previous visit of 7.6%.  His PCP gave him some  samples of Trulicity 1.63 mg SQ weekly and he is on his 3rd week of taking it.  He reports no s/s of hypoglycemia.) An ACE inhibitor/angiotensin II receptor blocker is being taken. He does not see a podiatrist.Eye exam is current.  Hyperlipidemia This is a chronic problem. The current episode started more than 1 year ago. The problem is uncontrolled. Recent lipid tests were reviewed and are variable. Exacerbating diseases include diabetes and obesity. Factors aggravating his hyperlipidemia include fatty foods. Pertinent negatives include no chest pain, myalgias or shortness of breath. Current antihyperlipidemic treatment includes statins. The current treatment provides moderate improvement of lipids. Compliance problems include adherence to diet and adherence to exercise.  Risk factors for coronary artery disease include dyslipidemia, diabetes mellitus, family history, hypertension, male sex, obesity and a sedentary lifestyle.  Hypertension This is a chronic problem. The current episode started more than 1 year ago. The problem has been gradually improving since onset. The problem is controlled. Pertinent negatives include no chest pain, headaches, neck pain, palpitations or shortness of breath. There are no associated agents to hypertension. Risk factors for coronary artery disease include diabetes mellitus, dyslipidemia, male gender, obesity, sedentary lifestyle, family history and smoking/tobacco exposure. Past treatments include ACE inhibitors. The current treatment provides moderate improvement. There are no compliance problems.     Review of systems  Constitutional: + Minimally fluctuating body weight,  current  There is no height or weight on file to calculate BMI. , no fatigue, no subjective hyperthermia, no subjective hypothermia Eyes: no blurry vision, no xerophthalmia ENT: no sore throat, no nodules palpated in throat, no dysphagia/odynophagia, no hoarseness Cardiovascular: no Chest Pain, no  Shortness of Breath, no palpitations, no leg swelling Respiratory: no cough, no shortness of breath Gastrointestinal: no Nausea/Vomiting/Diarrhea Musculoskeletal: no muscle/joint aches Skin: no rashes, no hyperemia Neurological: no tremors, no numbness, no tingling, no dizziness Psychiatric: no depression, no anxiety   Objective:    There were no vitals taken for this visit.  Wt Readings from Last 3 Encounters:  09/03/19 264 lb 8 oz (  120 kg)  06/11/19 255 lb (115.7 kg)  05/31/19 259 lb (117.5 kg)    BP Readings from Last 3 Encounters:  09/03/19 130/77  05/31/19 123/79  05/17/19 120/77    Physical Exam- Telehealth- significantly limited due to nature of visit  Constitutional: There is no height or weight on file to calculate BMI. , not in acute distress, normal state of mind Respiratory: Adequate breathing efforts  CMP ( most recent) CMP     Component Value Date/Time   NA 136 09/03/2019 0811   K 4.7 09/03/2019 0811   CL 98 09/03/2019 0811   CO2 22 09/03/2019 0811   GLUCOSE 210 (H) 09/03/2019 0811   GLUCOSE 208 (H) 04/03/2015 1500   BUN 17 09/03/2019 0811   CREATININE 1.13 09/03/2019 0811   CALCIUM 9.8 09/03/2019 0811   PROT 7.0 05/08/2019 0814   ALBUMIN 4.5 05/08/2019 0814   AST 22 05/08/2019 0814   ALT 22 05/08/2019 0814   ALKPHOS 78 05/08/2019 0814   BILITOT 0.5 05/08/2019 0814   GFRNONAA 73 09/03/2019 0811   GFRAA 85 09/03/2019 0811   Diabetic Labs (most recent): Lab Results  Component Value Date   HGBA1C 7.5 (H) 09/03/2019   HGBA1C 7.6 (H) 05/08/2019   HGBA1C 7.2 (A) 01/16/2019     Lipid Panel ( most recent) Lipid Panel     Component Value Date/Time   CHOL 152 05/08/2019 0814   TRIG 188 (H) 05/08/2019 0814   HDL 39 (L) 05/08/2019 0814   CHOLHDL 3.9 05/08/2019 0814   LDLCALC 81 05/08/2019 0814   LABVLDL 32 05/08/2019 0814     Lab Results  Component Value Date   TSH 2.010 05/08/2019   TSH 2.13 02/07/2018   TSH 3.510 09/16/2016   TSH 1.270  09/11/2014   FREET4 1.48 05/08/2019     Assessment & Plan:   1. Uncontrolled type 2 diabetes mellitus with hyperglycemia ([place])  - Timothy Nelson. has currently uncontrolled symptomatic type 2 DM since  54 years of age.  He presents for his virtual visit today without his meter or logs.  He seldomly checks his glucose at home.  His A1C on 09/03/19 was 7.5%, essentially unchanged from his previous visit of 7.6%.  His PCP gave him some samples of Trulicity 6.73 mg SQ weekly and he is on his 3rd week of taking it.  He reports no s/s of hypoglycemia.  - I had a long discussion with him about the progressive nature of diabetes and the pathology behind its complications. -his diabetes is complicated by obesity/sedentary life, history of smoking and he remains at a high risk for more acute and chronic complications which include CAD, CVA, CKD, retinopathy, and neuropathy. These are all discussed in detail with him.  - The patient admits there is a room for improvement in their diet and drink choices. -  Suggestion is made for the patient to avoid simple carbohydrates from their diet including Cakes, Sweet Desserts / Pastries, Ice Cream, Soda (diet and regular), Sweet Tea, Candies, Chips, Cookies, Sweet Pastries,  Store Bought Juices, Alcohol in Excess of  1-2 drinks a day, Artificial Sweeteners, Coffee Creamer, and "Sugar-free" Products. This will help patient to have stable blood glucose profile and potentially avoid unintended weight gain.   - I encouraged the patient to switch to  unprocessed or minimally processed complex starch and increased protein intake (animal or plant source), fruits, and vegetables.   - Patient is advised to stick to a routine mealtimes to  eat 3 meals  a day and avoid unnecessary snacks ( to snack only to correct hypoglycemia).  - he will be scheduled with Jearld Fenton, RDN, CDE for diabetes education.  - I have approached him with the following individualized plan to  manage  his diabetes and patient agrees:   -He is urged to start monitoring blood glucose at least twice daily, before breakfast and before bed, and notify the clinic if glucose levels are less than 70 or greater than 200 for 3 tests in a row.  -He is advised to continue Metformin 1000 mg po BID with meals, and Glipizide 10 mg XL daily with breakfast.  He has tolerated the 8.93 mg of Trulicity well.  Will increase his dose to Trulicity 1.5 mg SQ weekly.  Samples at office available for pickup.    - Specific targets for  A1c;  LDL, HDL, Triglycerides, were discussed with the patient.  2) Blood Pressure /Hypertension:  Based on previous visit readings, his blood pressure is controlled to target.  He does not have a way of monitoring BP at home.  He is advised to continue Lisinopril 20 mg po daily.    3) Lipids/Hyperlipidemia:  His recent lipid panel on 05/08/19 shows controlled LDL at 81 and triglycerides at 188.  He is advised to continue Simvastatin 40 mg po daily at bedtime and Fish Oil 1200 mg po daily. Side effects and precautions discussed with him. He is advised to avoid fried foods and butter.    4)  Weight/Diet: His There is no height or weight on file to calculate BMI.--   clearly complicating his diabetes care.  He is a candidate for modest weight loss.  I discussed with him the fact that loss of 5 - 10% of his  current body weight will have the most impact on his diabetes management.  Exercise, and detailed carbohydrates information provided  -  detailed on discharge instructions.  5) Chronic Care/Health Maintenance: -he  is on ACEI/ARB and Statin medications and  is encouraged to initiate and continue to follow up with Ophthalmology, Dentist,  Podiatrist at least yearly or according to recommendations, and advised to   stay away from smoking.  He is safe he does not believe in flu shots,  I have recommended yearly flu vaccine and pneumonia vaccine at least every 5 years; moderate intensity  exercise for up to 150 minutes weekly; and  sleep for at least 7 hours a day.  - he is  advised to maintain close follow up with Dettinger, Fransisca Kaufmann, MD for primary care needs, as well as his other providers for optimal and coordinated care.   I spent 30 minutes dedicated to the care of this patient on the date of this encounter to include pre-visit review of records, face-to-face time with the patient, and post visit ordering of  testing.   Please refer to Patient Instructions for Blood Glucose Monitoring and Insulin/Medications Dosing Guide"  in media tab for additional information. Please  also refer to " Patient Self Inventory" in the Media  tab for reviewed elements of pertinent patient history.  Timothy Nelson. participated in the discussions, expressed understanding, and voiced agreement with the above plans.  All questions were answered to his satisfaction. he is encouraged to contact clinic should he have any questions or concerns prior to his return visit.    Follow up plan: - Return in about 4 months (around 01/20/2020) for Diabetes follow up, Previsit labs, Virtual visit ok.  Rayetta Pigg, FNP-BC [facility] Phone: [phone] Fax: [phone]  09/20/2019, 8:41 AM  This note was partially dictated with voice recognition software. Similar sounding words can be transcribed inadequately or may not  be corrected upon review.

## 2019-09-20 NOTE — Patient Instructions (Signed)

## 2019-09-20 NOTE — Patient Instructions (Signed)
Goals Keep up the great job! Eat 45-60 grams of carbs per meal. Drink gallon of water per day Increase exercise 60 minutes 3 times per week Get A1C down to 7% Lose 3-4 lbs per month

## 2019-09-20 NOTE — Progress Notes (Signed)
  Medical Nutrition Therapy:  Appt start time: 0800 to 0815 am.  Assessment:  Primary concerns today:  DM Type 2, Obesity, HTN and Lipidemia.   A1C down from 7.6 to 7.5%. Eating a lot more vegetables. Drinking water before meals. Eating meals on time as best as possible. FBS  145-160 mg/dl. Bedtime 180's mg/dl. Has been on Trulicity. Tolerating well. Avoiding snacks. Wt up  4 lbs but he has been driving a lot out of town and sitting more than usual.   Lab Results  Component Value Date   HGBA1C 7.5 (H) 09/03/2019  Preferred Learning Style:   No preference indicated   Learning Readiness:  Ready  Change in progress  MEDICATIONS: see   DIETARY INTAKE:  24-hr recall:  B ( AM) egg sandwich or bagel with egg, water. Snk ( AM): L ( PM): Spring mix chicken with water, Snk ( PM):  D ( PM):  Roast beef, green beans, and sweet potato, water. fruit Snk ( PM):  Beverages: water  Usual physical activity: ADL  Estimated energy needs: 1800 calories 200 g carbohydrates 135 g protein 50 g fat  Progress Towards Goal(s):  In progress.   Nutritional Diagnosis:  NB-1.1 Food and nutrition-related knowledge deficit As related to Diabetes Type 2.  As evidenced by A1C 7.9%.    Intervention:  Nutrition and Diabetes education provided on My Plate, CHO counting, meal planning, portion sizes, timing of meals, avoiding snacks between meals unless having a low blood sugar, target ranges for A1C and blood sugars, signs/symptoms and treatment of hyper/hypoglycemia, monitoring blood sugars, taking medications as prescribed, benefits of exercising 30 minutes per day and prevention of complications of DM.   Goals Keep up the great job! Eat 45-60 grams of carbs per meal. Drink gallon of water per day Increase exercise 60 minutes 3 times per week Get A1C down to 7% Lose 3-4 lbs per month.   Teaching Method Utilized:  Visual Auditory Hands on  Handouts given during visit include:  The Plate  Method   Meal Plan Card  Diabetes Instructions   Barriers to learning/adherence to lifestyle change: leg issues  Demonstrated degree of understanding via:  Teach Back   Monitoring/Evaluation:  Dietary intake, exercise, , and body weight in 4 month(s).

## 2019-10-07 ENCOUNTER — Other Ambulatory Visit: Payer: Self-pay | Admitting: Family Medicine

## 2019-10-07 DIAGNOSIS — J0100 Acute maxillary sinusitis, unspecified: Secondary | ICD-10-CM

## 2019-10-15 ENCOUNTER — Encounter: Payer: Self-pay | Admitting: Family Medicine

## 2019-10-15 MED ORDER — OMEPRAZOLE 20 MG PO CPDR: 20.0000 mg | DELAYED_RELEASE_CAPSULE | Freq: Every day | ORAL | 6 refills | Status: DC

## 2019-10-16 MED ORDER — OMEPRAZOLE 40 MG PO CPDR: 40.0000 mg | DELAYED_RELEASE_CAPSULE | Freq: Every day | ORAL | 3 refills | Status: DC

## 2019-10-16 NOTE — Addendum Note (Signed)
Addended by: Caryl Pina on: 10/16/2019 07:46 AM   Modules accepted: Orders

## 2019-11-15 DIAGNOSIS — E1142 Type 2 diabetes mellitus with diabetic polyneuropathy: Secondary | ICD-10-CM | POA: Diagnosis not present

## 2019-11-15 DIAGNOSIS — L602 Onychogryphosis: Secondary | ICD-10-CM | POA: Diagnosis not present

## 2019-11-20 ENCOUNTER — Telehealth: Payer: Self-pay

## 2019-11-20 NOTE — Telephone Encounter (Signed)
(  Key: [id number])   PA initiated today through Covermymeds today for Omeprazole 40mg  tabs   Frequent Belching

## 2019-11-21 NOTE — Telephone Encounter (Signed)
Outcome Approvedon November 16 PA Case: [id number], Status: Approved, Coverage Starts on: 01/05/2019 12:00:00 AM, Coverage Ends on: 01/03/2021 12:00:00 AM. Questions? Contact [phone]. Drug Pantoprazole Sodium 40MG  dr tablets Form [employer] Electronic PA Form  Approval faxed to [facility].

## 2019-12-03 ENCOUNTER — Other Ambulatory Visit: Payer: Self-pay

## 2019-12-03 ENCOUNTER — Ambulatory Visit (INDEPENDENT_AMBULATORY_CARE_PROVIDER_SITE_OTHER): Payer: Medicare HMO | Admitting: Family Medicine

## 2019-12-03 ENCOUNTER — Encounter: Payer: Self-pay | Admitting: Family Medicine

## 2019-12-03 VITALS — BP 134/86 | HR 79 | Temp 97.1°F | Ht 73.0 in | Wt 265.0 lb

## 2019-12-03 DIAGNOSIS — E1169 Type 2 diabetes mellitus with other specified complication: Secondary | ICD-10-CM | POA: Diagnosis not present

## 2019-12-03 DIAGNOSIS — M79671 Pain in right foot: Secondary | ICD-10-CM

## 2019-12-03 DIAGNOSIS — E1165 Type 2 diabetes mellitus with hyperglycemia: Secondary | ICD-10-CM | POA: Diagnosis not present

## 2019-12-03 DIAGNOSIS — E11621 Type 2 diabetes mellitus with foot ulcer: Secondary | ICD-10-CM | POA: Diagnosis not present

## 2019-12-03 DIAGNOSIS — L97412 Non-pressure chronic ulcer of right heel and midfoot with fat layer exposed: Secondary | ICD-10-CM | POA: Diagnosis not present

## 2019-12-03 DIAGNOSIS — G8929 Other chronic pain: Secondary | ICD-10-CM

## 2019-12-03 LAB — BAYER DCA HB A1C WAIVED: HB A1C (BAYER DCA - WAIVED): 7.1 % — ABNORMAL HIGH (ref <7.0)

## 2019-12-03 MED ORDER — OXYCODONE HCL 10 MG PO TABS: 10.0000 mg | ORAL_TABLET | Freq: Four times a day (QID) | ORAL | 0 refills | Status: DC | PRN

## 2019-12-03 NOTE — Progress Notes (Signed)
BP 134/86   Pulse 79   Temp (!) 97.1 F (36.2 C)   Ht 6\' 1"  (1.854 m)   Wt 265 lb (120.2 kg)   SpO2 96%   BMI 34.96 kg/m    Subjective:   Patient ID: Timothy Nelson., male    DOB: 07-21-1965, 54 y.o.   MRN: [id number]  HPI: [place] Gandolfo. is a 54 y.o. male presenting on 12/03/2019 for Medical Management of Chronic Issues and Diabetes   HPI Pain assessment: Cause of pain-bilateral ankle deformities due to fractures in the past Pain location-both ankles Pain on scale of 1-10- 5 Frequency-Daily What increases pain-winter weather has increased his pain some What makes pain Better-oxycodone and has been seeing podiatry trying to get good inserts in shoes Effects on ADL -limits some in his ability to walk for any prolonged periods Any change in general medical condition-none  Current opioids rx-oxycodone 10 mg 4 times daily as needed # meds rx-120 Effectiveness of current meds-works well Adverse reactions from pain meds-none Morphine equivalent-60  Pill count performed-No Last drug screen -03/22/2019 ( high risk q38m, moderate risk q3m, low risk yearly ) Urine drug screen today- No Was the [place] reviewed-yes  If yes were their any concerning findings? -None  No flowsheet data found.   Pain contract signed on: 03/22/2019  Type 2 diabetes mellitus Patient comes in today for recheck of his diabetes. Patient has been currently taking Trulicity and glipizide and Metformin, A1c 7.1 today which is improved. Patient is currently on an ACE inhibitor/ARB. Patient has seen an ophthalmologist this year. Patient denies any new issues with their feet. The symptom started onset as an adult hypertension and hyperlipidemia and neuropathy ARE RELATED TO DM   Relevant past medical, surgical, family and social history reviewed and updated as indicated. Interim medical history since our last visit reviewed. Allergies and medications reviewed and updated.  Review of Systems   Constitutional: Negative for chills and fever.  Respiratory: Negative for shortness of breath and wheezing.   Cardiovascular: Negative for chest pain.  Musculoskeletal: Positive for arthralgias and joint swelling. Negative for back pain and gait problem.  Skin: Negative for rash.  Neurological: Positive for numbness. Negative for dizziness and weakness.  All other systems reviewed and are negative.   Per HPI unless specifically indicated above   Allergies as of 12/03/2019      Reactions   Fenofibrate Rash   Permament rash.      Medication List       Accurate as of December 03, 2019 10:50 AM. If you have any questions, ask your nurse or doctor.        Accu-Chek Softclix Lancets lancets   CINNAMON PO Take 1 capsule by mouth 2 (two) times daily.   Fish Oil 1200 MG Cpdr Take 1,200 mg by mouth daily.   fluticasone 50 MCG/ACT nasal spray Commonly known as: FLONASE USE 1 SPRAY IN EACH NOSTRIL TWICE DAILY AS NEEDED FOR ALLERGIES OR RHINITIS (SUBSTITUTED FOR FLONASE)   Fluzone Quadrivalent 0.5 ML injection Generic drug: influenza vac split quadrivalent PF   glipiZIDE 10 MG 24 hr tablet Commonly known as: GLUCOTROL XL Take 1 tablet (10 mg total) by mouth daily with breakfast.   ketoconazole 2 % cream Commonly known as: NIZORAL APPLY 1 APPLICATION TOPICALLY DAILY.   lisinopril 20 MG tablet Commonly known as: ZESTRIL Take 1 tablet (20 mg total) by mouth daily.   metFORMIN 1000 MG tablet Commonly known as: GLUCOPHAGE Take 1 tablet (  1,000 mg total) by mouth 2 (two) times daily with a meal.   omeprazole 40 MG capsule Commonly known as: PRILOSEC Take 1 capsule (40 mg total) by mouth daily.   Oxycodone HCl 10 MG Tabs Take 1 tablet (10 mg total) by mouth 4 (four) times daily as needed.   pantoprazole 40 MG tablet Commonly known as: PROTONIX Take 1 tablet (40 mg total) by mouth daily.   Restasis 0.05 % ophthalmic emulsion Generic drug: cycloSPORINE SMARTSIG:1  Drop(s) In Eye(s) Every 12 Hours   simvastatin 40 MG tablet Commonly known as: ZOCOR Take 1 tablet (40 mg total) by mouth daily at 6 PM.   triamcinolone 0.1 % Commonly known as: KENALOG Apply 1 application topically 2 (two) times daily.   Trulicity 1.5 YK/9.9IP Sopn Generic drug: Dulaglutide Inject 1.5 mg into the skin once a week.        Objective:   BP 134/86   Pulse 79   Temp (!) 97.1 F (36.2 C)   Ht 6\' 1"  (1.854 m)   Wt 265 lb (120.2 kg)   SpO2 96%   BMI 34.96 kg/m   Wt Readings from Last 3 Encounters:  12/03/19 265 lb (120.2 kg)  09/20/19 264 lb (119.7 kg)  09/03/19 264 lb 8 oz (120 kg)    Physical Exam Vitals and nursing note reviewed.  Constitutional:      General: He is not in acute distress.    Appearance: He is well-developed. He is not diaphoretic.  Eyes:     General: No scleral icterus.    Conjunctiva/sclera: Conjunctivae normal.  Neck:     Thyroid: No thyromegaly.  Cardiovascular:     Rate and Rhythm: Normal rate and regular rhythm.     Heart sounds: Normal heart sounds. No murmur heard.   Pulmonary:     Effort: Pulmonary effort is normal. No respiratory distress.     Breath sounds: Normal breath sounds. No wheezing.  Musculoskeletal:     Cervical back: Neck supple.  Lymphadenopathy:     Cervical: No cervical adenopathy.  Skin:    General: Skin is warm and dry.     Findings: No rash.  Neurological:     Mental Status: He is alert and oriented to person, place, and time.     Coordination: Coordination normal.  Psychiatric:        Behavior: Behavior normal.       Assessment & Plan:   Problem List Items Addressed This Visit      Endocrine   Uncontrolled type 2 diabetes mellitus with hyperglycemia ([place]) - Primary   Relevant Orders   Bayer DCA Hb A1c Waived   Diabetic ulcer of right midfoot associated with type 2 diabetes mellitus, with fat layer exposed ([place])   Diabetes mellitus, type 2 (HCC)     Other   Chronic foot pain    Relevant Medications   Oxycodone HCl 10 MG TABS   Oxycodone HCl 10 MG TABS (Start on 01/01/2020)   Oxycodone HCl 10 MG TABS (Start on 02/01/2020)      Continue the current medication and continue oxycodone, gave refills for oxycodone. Follow up plan: Return in about 3 months (around 03/03/2020), or if symptoms worsen or fail to improve, for Pain management recheck and diabetes.  Counseling provided for all of the vaccine components Orders Placed This Encounter  Procedures  . [facility] Hb A1c [place], MD [facility] 12/03/2019, 10:50 AM

## 2019-12-05 ENCOUNTER — Other Ambulatory Visit: Payer: Self-pay | Admitting: Family Medicine

## 2019-12-13 DIAGNOSIS — H179 Unspecified corneal scar and opacity: Secondary | ICD-10-CM | POA: Diagnosis not present

## 2019-12-24 ENCOUNTER — Other Ambulatory Visit: Payer: Self-pay | Admitting: Family Medicine

## 2019-12-24 DIAGNOSIS — J0100 Acute maxillary sinusitis, unspecified: Secondary | ICD-10-CM

## 2020-01-09 ENCOUNTER — Other Ambulatory Visit: Payer: Self-pay | Admitting: Family Medicine

## 2020-01-16 ENCOUNTER — Other Ambulatory Visit: Payer: Self-pay

## 2020-01-16 DIAGNOSIS — E1165 Type 2 diabetes mellitus with hyperglycemia: Secondary | ICD-10-CM

## 2020-01-18 ENCOUNTER — Other Ambulatory Visit: Payer: Medicare HMO

## 2020-01-18 ENCOUNTER — Other Ambulatory Visit: Payer: Self-pay

## 2020-01-18 DIAGNOSIS — E1165 Type 2 diabetes mellitus with hyperglycemia: Secondary | ICD-10-CM | POA: Diagnosis not present

## 2020-01-19 LAB — COMPREHENSIVE METABOLIC PANEL
ALT: 36 IU/L (ref 0–44)
AST: 18 IU/L (ref 0–40)
Albumin/Globulin Ratio: 1.9 (ref 1.2–2.2)
Albumin: 4.8 g/dL (ref 3.8–4.9)
Alkaline Phosphatase: 95 IU/L (ref 44–121)
BUN/Creatinine Ratio: 13 (ref 9–20)
BUN: 16 mg/dL (ref 6–24)
Bilirubin Total: 0.2 mg/dL (ref 0.0–1.2)
CO2: 21 mmol/L (ref 20–29)
Calcium: 10.4 mg/dL — ABNORMAL HIGH (ref 8.7–10.2)
Chloride: 99 mmol/L (ref 96–106)
Creatinine, Ser: 1.23 mg/dL (ref 0.76–1.27)
GFR calc Af Amer: 76 mL/min/{1.73_m2} (ref >59)
GFR calc non Af Amer: 66 mL/min/{1.73_m2} (ref >59)
Globulin, Total: 2.5 g/dL (ref 1.5–4.5)
Glucose: 167 mg/dL — ABNORMAL HIGH (ref 65–99)
Potassium: 4.9 mmol/L (ref 3.5–5.2)
Sodium: 138 mmol/L (ref 134–144)
Total Protein: 7.3 g/dL (ref 6.0–8.5)

## 2020-01-19 LAB — HEMOGLOBIN A1C
Est. average glucose Bld gHb Est-mCnc: 166 mg/dL
Hgb A1c MFr Bld: 7.4 % — ABNORMAL HIGH (ref 4.8–5.6)

## 2020-01-23 ENCOUNTER — Other Ambulatory Visit: Payer: Self-pay

## 2020-01-23 ENCOUNTER — Telehealth: Payer: Self-pay | Admitting: Nutrition

## 2020-01-23 ENCOUNTER — Encounter: Payer: Self-pay | Admitting: Nurse Practitioner

## 2020-01-23 ENCOUNTER — Encounter: Payer: Medicare HMO | Attending: Family Medicine | Admitting: Nutrition

## 2020-01-23 ENCOUNTER — Ambulatory Visit: Payer: Medicare HMO | Admitting: Nurse Practitioner

## 2020-01-23 VITALS — BP 152/89 | HR 83 | Ht 73.0 in | Wt 262.2 lb

## 2020-01-23 DIAGNOSIS — E782 Mixed hyperlipidemia: Secondary | ICD-10-CM

## 2020-01-23 DIAGNOSIS — I10 Essential (primary) hypertension: Secondary | ICD-10-CM | POA: Diagnosis not present

## 2020-01-23 DIAGNOSIS — E1165 Type 2 diabetes mellitus with hyperglycemia: Secondary | ICD-10-CM | POA: Diagnosis not present

## 2020-01-23 MED ORDER — GLIPIZIDE ER 5 MG PO TB24: 5.0000 mg | ORAL_TABLET | Freq: Every day | ORAL | 3 refills | Status: DC

## 2020-01-23 NOTE — Progress Notes (Signed)
01/23/2020, 9:05 AM   Endocrinology follow-up note   Subjective:    Patient ID: Timothy Wilbur., male    DOB: [birth year].  Timothy Bouche. is being seen in follow-up after he was seen in consultation for management of currently uncontrolled symptomatic diabetes requested by  Dettinger, Fransisca Kaufmann, MD.   Past Medical History:  Diagnosis Date  . Arthritis   . Barrett's esophagus   . Chronic pain   . Diabetes ([place])   . Essential hypertension   . Gastroparesis   . GERD (gastroesophageal reflux disease)   . Hyperlipidemia   . Obesity   . Type 2 diabetes mellitus ([place])     Past Surgical History:  Procedure Laterality Date  . ESOPHAGOGASTRODUODENOSCOPY (EGD) WITH PROPOFOL N/A 04/04/2015   Procedure: ESOPHAGOGASTRODUODENOSCOPY (EGD) WITH PROPOFOL;  Surgeon: Rogene Houston, MD;  Location: [facility];  Service: Endoscopy;  Laterality: N/A;  8:30  . FRACTURE SURGERY  March 2007   Left arm, left thumb, bilateral legs  . HIP SURGERY Right   . WRIST FRACTURE SURGERY Left     Social History   Socioeconomic History  . Marital status: Married    Spouse name: Not on file  . Number of children: 1  . Years of education: Not on file  . Highest education level: Associate degree: occupational, Hotel manager, or vocational program  Occupational History  . Occupation: Disability  Tobacco Use  . Smoking status: Current Some Day Smoker    Packs/day: 1.00    Years: 20.00    Pack years: 20.00    Types: Cigars  . Smokeless tobacco: Never Used  . Tobacco comment: Smoked intermittently for 20 years; 1-2 cigars weekly as of 24/2020  Vaping Use  . Vaping Use: Never used  Substance and Sexual Activity  . Alcohol use: Yes    Alcohol/week: 1.0 standard drink    Types: 1 Glasses of wine per week    Comment: glass wine a week.   . Drug use: No  . Sexual activity: Yes  Other Topics Concern  . Not on file  Social History  Narrative  . Not on file   Social Determinants of Health   Financial Resource Strain: Not on file  Food Insecurity: Not on file  Transportation Needs: Not on file  Physical Activity: Not on file  Stress: Not on file  Social Connections: Not on file    Family History  Problem Relation Age of Onset  . Diabetes Mother   . Hypertension Mother   . Hyperlipidemia Mother   . Arthritis Mother   . Esophageal cancer Mother        patient unsure  . Diabetes Father   . Heart disease Father        Age 66  . Kidney disease Father   . Hypertension Sister   . Diabetes Daughter   . Colon cancer Neg Hx   . Stomach cancer Neg Hx   . Pancreatic cancer Neg Hx   . Liver disease Neg Hx     Outpatient Encounter Medications as of 01/23/2020  Medication Sig  . ketoconazole (NIZORAL) 2 % cream APPLY 1 APPLICATION TOPICALLY DAILY.  Marland Kitchen Accu-Chek Softclix Lancets lancets   .  CINNAMON PO Take 1 capsule by mouth 2 (two) times daily.  . cycloSPORINE (RESTASIS) 0.05 % ophthalmic emulsion SMARTSIG:1 Drop(s) In Eye(s) Every 12 Hours  . Dulaglutide (TRULICITY) 1.5 0000000 SOPN Inject 1.5 mg into the skin once a week.  . fluticasone (FLONASE) 50 MCG/ACT nasal spray USE 1 SPRAY IN EACH NOSTRIL TWICE DAILY AS NEEDED FOR ALLERGIES OR RHINITIS (SUBSTITUTED FOR FLONASE)  . FLUZONE QUADRIVALENT 0.5 ML injection   . glipiZIDE (GLUCOTROL XL) 5 MG 24 hr tablet Take 1 tablet (5 mg total) by mouth daily with breakfast.  . lisinopril (ZESTRIL) 20 MG tablet TAKE 1 TABLET EVERY DAY  . metFORMIN (GLUCOPHAGE) 1000 MG tablet TAKE 1 TABLET (1,000 MG TOTAL) BY MOUTH 2 (TWO) TIMES DAILY WITH A MEAL.  Marland Kitchen Omega-3 Fatty Acids (FISH OIL) 1200 MG CPDR Take 1,200 mg by mouth daily.  Marland Kitchen omeprazole (PRILOSEC) 40 MG capsule Take 1 capsule (40 mg total) by mouth daily.  Derrill Memo ON 02/01/2020] Oxycodone HCl 10 MG TABS Take 1 tablet (10 mg total) by mouth 4 (four) times daily as needed.  . pantoprazole (PROTONIX) 40 MG tablet Take 1 tablet  (40 mg total) by mouth daily.  . simvastatin (ZOCOR) 40 MG tablet TAKE 1 TABLET EVERY DAY  AT  6  PM  . triamcinolone cream (KENALOG) 0.1 % Apply 1 application topically 2 (two) times daily.  . [DISCONTINUED] glipiZIDE (GLUCOTROL XL) 10 MG 24 hr tablet TAKE 1 TABLET (10 MG TOTAL) BY MOUTH DAILY WITH BREAKFAST.  . [DISCONTINUED] Oxycodone HCl 10 MG TABS Take 1 tablet (10 mg total) by mouth 4 (four) times daily as needed.  . [DISCONTINUED] Oxycodone HCl 10 MG TABS Take 1 tablet (10 mg total) by mouth 4 (four) times daily as needed.   No facility-administered encounter medications on file as of 01/23/2020.    ALLERGIES: Allergies  Allergen Reactions  . Fenofibrate Rash    Permament rash.     VACCINATION STATUS: Immunization History  Administered Date(s) Administered  . Influenza,inj,Quad PF,6+ Mos 11/15/2018  . Influenza-Unspecified 09/04/2016  . Moderna Sars-Covid-2 Vaccination 03/13/2019, 04/10/2019  . Pneumococcal Polysaccharide-23 09/16/2016    Diabetes He presents for his follow-up diabetic visit. He has type 2 diabetes mellitus. Onset time: He was diagnosed at approximate age of 51 years. His disease course has been stable. There are no hypoglycemic associated symptoms. Pertinent negatives for hypoglycemia include no confusion, headaches, pallor or seizures. Pertinent negatives for diabetes include no chest pain, no fatigue, no polydipsia, no polyphagia, no polyuria and no weakness. There are no hypoglycemic complications. Symptoms are stable. There are no diabetic complications. Risk factors for coronary artery disease include diabetes mellitus, dyslipidemia, family history, obesity, male sex, hypertension, sedentary lifestyle and tobacco exposure. Current diabetic treatment includes oral agent (dual therapy). He is compliant with treatment most of the time. His weight is decreasing steadily. He is following a generally unhealthy diet. When asked about meal planning, he reported none.  He has not had a previous visit with a dietitian. He rarely participates in exercise. His home blood glucose trend is fluctuating dramatically. His breakfast blood glucose range is generally 140-180 mg/dl. His bedtime blood glucose range is generally 90-110 mg/dl. (He presents today with no meter or logs to review.  His previsit A1c was 7.4%, slightly worse than last visit of 7.1%.  He reports his glucose drops around 80 or so in the late afternoon about 3-4 times per week in which he starts getting symptoms and therefore treats with a  soda.  He states he has cut back on his pizza and pasta intake and is frustrated that his A1c is fluctuating like it is.) An ACE inhibitor/angiotensin II receptor blocker is being taken. He does not see a podiatrist.Eye exam is current.  Hyperlipidemia This is a chronic problem. The current episode started more than 1 year ago. The problem is uncontrolled. Recent lipid tests were reviewed and are variable. Exacerbating diseases include diabetes and obesity. Factors aggravating his hyperlipidemia include fatty foods. Pertinent negatives include no chest pain, myalgias or shortness of breath. Current antihyperlipidemic treatment includes statins. The current treatment provides moderate improvement of lipids. Compliance problems include adherence to diet and adherence to exercise.  Risk factors for coronary artery disease include dyslipidemia, diabetes mellitus, family history, hypertension, male sex, obesity and a sedentary lifestyle.  Hypertension This is a chronic problem. The current episode started more than 1 year ago. The problem has been gradually improving since onset. The problem is controlled. Pertinent negatives include no chest pain, headaches, neck pain, palpitations or shortness of breath. There are no associated agents to hypertension. Risk factors for coronary artery disease include diabetes mellitus, dyslipidemia, male gender, obesity, sedentary lifestyle, family  history and smoking/tobacco exposure. Past treatments include ACE inhibitors. The current treatment provides moderate improvement. There are no compliance problems.     Review of systems  Constitutional: + steadily decreasing body weight,  current  Body mass index is 34.59 kg/m. , no fatigue, no subjective hyperthermia, no subjective hypothermia Eyes: no blurry vision, no xerophthalmia ENT: no sore throat, no nodules palpated in throat, no dysphagia/odynophagia, no hoarseness Cardiovascular: no Chest Pain, no Shortness of Breath, no palpitations, no leg swelling Respiratory: no cough, no shortness of breath Gastrointestinal: no Nausea/Vomiting/Diarrhea Musculoskeletal: no muscle/joint aches Skin: no rashes, no hyperemia Neurological: no tremors, no numbness, no tingling, no dizziness Psychiatric: no depression, no anxiety   Objective:    BP (!) 152/89   Pulse 83   Ht 6\' 1"  (1.854 m)   Wt 262 lb 3.2 oz (118.9 kg)   BMI 34.59 kg/m   Wt Readings from Last 3 Encounters:  01/23/20 262 lb 3.2 oz (118.9 kg)  12/03/19 265 lb (120.2 kg)  09/20/19 264 lb (119.7 kg)    BP Readings from Last 3 Encounters:  01/23/20 (!) 152/89  12/03/19 134/86  09/03/19 130/77     Physical Exam- Limited  Constitutional:  Body mass index is 34.59 kg/m., not in acute distress, normal state of mind Eyes:  EOMI, no exophthalmos Neck: Supple Cardiovascular: RRR, no murmers, rubs, or gallops, no edema Respiratory: Adequate breathing efforts, no crackles, rales, rhonchi, or wheezing Musculoskeletal: no gross deformities, strength intact in all four extremities, no gross restriction of joint movements Skin:  no rashes, no hyperemia Neurological: no tremor with outstretched hands   CMP ( most recent) CMP     Component Value Date/Time   NA 138 01/18/2020 0842   K 4.9 01/18/2020 0842   CL 99 01/18/2020 0842   CO2 21 01/18/2020 0842   GLUCOSE 167 (H) 01/18/2020 0842   GLUCOSE 208 (H) 04/03/2015  1500   BUN 16 01/18/2020 0842   CREATININE 1.23 01/18/2020 0842   CALCIUM 10.4 (H) 01/18/2020 0842   PROT 7.3 01/18/2020 0842   ALBUMIN 4.8 01/18/2020 0842   AST 18 01/18/2020 0842   ALT 36 01/18/2020 0842   ALKPHOS 95 01/18/2020 0842   BILITOT 0.2 01/18/2020 0842   GFRNONAA 66 01/18/2020 0842   GFRAA 76 01/18/2020  0842   Diabetic Labs (most recent): Lab Results  Component Value Date   HGBA1C 7.4 (H) 01/18/2020   HGBA1C 7.1 (H) 12/03/2019   HGBA1C 7.5 (H) 09/03/2019     Lipid Panel ( most recent) Lipid Panel     Component Value Date/Time   CHOL 152 05/08/2019 0814   TRIG 188 (H) 05/08/2019 0814   HDL 39 (L) 05/08/2019 0814   CHOLHDL 3.9 05/08/2019 0814   LDLCALC 81 05/08/2019 0814   LABVLDL 32 05/08/2019 0814     Lab Results  Component Value Date   TSH 2.010 05/08/2019   TSH 2.13 02/07/2018   TSH 3.510 09/16/2016   TSH 1.270 09/11/2014   FREET4 1.48 05/08/2019     Assessment & Plan:   1) Uncontrolled type 2 diabetes mellitus with hyperglycemia (HCC)  - Timothy Bouche. has currently uncontrolled symptomatic type 2 DM since  55 years of age.  He presents today with no meter or logs to review.  His previsit A1c was 7.4%, slightly worse than last visit of 7.1%.  He reports his glucose drops around 80 or so in the late afternoon about 3-4 times per week in which he starts getting symptoms and therefore treats with a soda.  He states he has cut back on his pizza and pasta intake and is frustrated that his A1c is fluctuating like it is.  - I had a long discussion with him about the progressive nature of diabetes and the pathology behind its complications. -his diabetes is complicated by obesity/sedentary life, history of smoking and he remains at a high risk for more acute and chronic complications which include CAD, CVA, CKD, retinopathy, and neuropathy. These are all discussed in detail with him.  - Nutritional counseling repeated at each appointment due to patients  tendency to fall back in to old habits.  - The patient admits there is a room for improvement in their diet and drink choices. -  Suggestion is made for the patient to avoid simple carbohydrates from their diet including Cakes, Sweet Desserts / Pastries, Ice Cream, Soda (diet and regular), Sweet Tea, Candies, Chips, Cookies, Sweet Pastries,  Store Bought Juices, Alcohol in Excess of  1-2 drinks a day, Artificial Sweeteners, Coffee Creamer, and "Sugar-free" Products. This will help patient to have stable blood glucose profile and potentially avoid unintended weight gain.   - I encouraged the patient to switch to  unprocessed or minimally processed complex starch and increased protein intake (animal or plant source), fruits, and vegetables.   - Patient is advised to stick to a routine mealtimes to eat 3 meals  a day and avoid unnecessary snacks ( to snack only to correct hypoglycemia).  - he has seen Jearld Fenton, RDN, CDE for diabetes education.  - I have approached him with the following individualized plan to manage  his diabetes and patient agrees:   -Given his late day drops in glucose, he is advised to decrease his dose of Glipizide to 5 mg XL daily with breakfast.  He can continue his Metformin 1000 mg po twice daily with meals and his Trulicity 1.5 mg SQ weekly.  He is urged to start monitoring blood glucose at least twice daily, before breakfast and before bed, and notify the clinic if glucose levels are less than 70 or greater than 200 for 3 tests in a row.  - Specific targets for  A1c;  LDL, HDL, Triglycerides, were discussed with the patient.  2) Blood Pressure /Hypertension:  His  blood pressure is not controlled to target.  He had not long taken his BP meds prior to his appointment today.  He is advised to continue Lisinopril 20 mg po daily.    3) Lipids/Hyperlipidemia:  His recent lipid panel on 05/08/19 shows controlled LDL at 81 and triglycerides at 188.  He is advised to  continue Simvastatin 40 mg po daily at bedtime and Fish Oil 1200 mg po daily. Side effects and precautions discussed with him. He is advised to avoid fried foods and butter.    4)  Weight/Diet: His Body mass index is 34.59 kg/m.--   clearly complicating his diabetes care.  He is a candidate for modest weight loss.  I discussed with him the fact that loss of 5 - 10% of his  current body weight will have the most impact on his diabetes management.  Exercise, and detailed carbohydrates information provided  -  detailed on discharge instructions.  5) Chronic Care/Health Maintenance: -he is on ACEI/ARB and Statin medications and  is encouraged to initiate and continue to follow up with Ophthalmology, Dentist,  Podiatrist at least yearly or according to recommendations, and advised to stay away from smoking.  He is safe he does not believe in flu shots,  I have recommended yearly flu vaccine and pneumonia vaccine at least every 5 years; moderate intensity exercise for up to 150 minutes weekly; and  sleep for at least 7 hours a day.  - he is  advised to maintain close follow up with Dettinger, Fransisca Kaufmann, MD for primary care needs, as well as his other providers for optimal and coordinated care.   - Time spent on this patient care encounter:  30 min, of which > 50% was spent in  counseling and the rest reviewing his blood glucose logs , discussing his hypoglycemia and hyperglycemia episodes, reviewing his current and  previous labs / studies  ( including abstraction from other facilities) and medications  doses and developing a  long term treatment plan and documenting his care.   Please refer to Patient Instructions for Blood Glucose Monitoring and Insulin/Medications Dosing Guide"  in media tab for additional information. Please  also refer to " Patient Self Inventory" in the Media  tab for reviewed elements of pertinent patient history.  Timothy Bouche. participated in the discussions, expressed  understanding, and voiced agreement with the above plans.  All questions were answered to his satisfaction. he is encouraged to contact clinic should he have any questions or concerns prior to his return visit.    Follow up plan: - Return in about 4 months (around 05/22/2020) for Diabetes follow up with A1c in office, No previsit labs, ABI next visit, Bring glucometer and logs.  Rayetta Pigg, [facility] [address] [place], [place] [address] Phone: [phone] Fax: [phone]  01/23/2020, 9:05 AM

## 2020-01-23 NOTE — Telephone Encounter (Signed)
vm left to call to do mychart visit for follow up for DM.

## 2020-01-23 NOTE — Patient Instructions (Signed)

## 2020-03-03 ENCOUNTER — Encounter: Payer: Self-pay | Admitting: Family Medicine

## 2020-03-03 ENCOUNTER — Ambulatory Visit (INDEPENDENT_AMBULATORY_CARE_PROVIDER_SITE_OTHER): Payer: Medicare HMO | Admitting: Family Medicine

## 2020-03-03 ENCOUNTER — Other Ambulatory Visit: Payer: Self-pay

## 2020-03-03 VITALS — BP 144/89 | HR 75 | Ht 73.0 in | Wt 267.5 lb

## 2020-03-03 DIAGNOSIS — E1159 Type 2 diabetes mellitus with other circulatory complications: Secondary | ICD-10-CM

## 2020-03-03 DIAGNOSIS — E785 Hyperlipidemia, unspecified: Secondary | ICD-10-CM

## 2020-03-03 DIAGNOSIS — G8929 Other chronic pain: Secondary | ICD-10-CM

## 2020-03-03 DIAGNOSIS — E1169 Type 2 diabetes mellitus with other specified complication: Secondary | ICD-10-CM

## 2020-03-03 DIAGNOSIS — E1165 Type 2 diabetes mellitus with hyperglycemia: Secondary | ICD-10-CM

## 2020-03-03 DIAGNOSIS — E782 Mixed hyperlipidemia: Secondary | ICD-10-CM

## 2020-03-03 DIAGNOSIS — I152 Hypertension secondary to endocrine disorders: Secondary | ICD-10-CM

## 2020-03-03 DIAGNOSIS — M79671 Pain in right foot: Secondary | ICD-10-CM

## 2020-03-03 MED ORDER — OXYCODONE HCL 10 MG PO TABS: 10.0000 mg | ORAL_TABLET | Freq: Four times a day (QID) | ORAL | 0 refills | Status: DC | PRN

## 2020-03-03 MED ORDER — OXYCODONE HCL 10 MG PO TABS
10.0000 mg | ORAL_TABLET | Freq: Four times a day (QID) | ORAL | 0 refills | Status: DC | PRN
Start: 2020-03-03 — End: 2020-06-04

## 2020-03-03 MED ORDER — METOCLOPRAMIDE HCL 5 MG PO TABS: 5.0000 mg | ORAL_TABLET | Freq: Three times a day (TID) | ORAL | 3 refills | Status: DC

## 2020-03-03 NOTE — Progress Notes (Signed)
BP (!) 144/89   Pulse 75   Ht 6\' 1"  (1.854 m)   Wt 267 lb 8 oz (121.3 kg)   SpO2 96%   BMI 35.29 kg/m    Subjective:   Patient ID: Timothy Bouche., male    DOB: 25-Jan-1965, 55 y.o.   MRN: [id number]  HPI: Timothy Mester. is a 55 y.o. male presenting on 03/03/2020 for Medical Management of Chronic Issues   HPI Pain assessment: Cause of pain-bilateral foot and ankle pain from traumatic injury Pain location-both ankles and feet Pain on scale of 1-10- 6 Frequency-Daily What increases pain-walking and being on his feet more What makes pain Better-oxycodone and rest Effects on ADL -limits some prolonged walking but he still manages to volunteer and help out at places Any change in general medical condition-none  Current opioids rx-oxycodone 10 mg 4 times daily as needed # meds rx-120 Effectiveness of current meds-works well Adverse reactions from pain meds-none except the occasional constipation Morphine equivalent-60  Pill count performed-No Last drug screen -03/22/2019 ( high risk q52m, moderate risk q28m, low risk yearly ) Urine drug screen today- Yes Was the [place] reviewed-yes  If yes were their any concerning findings? -None   Overdose risk: 140 No flowsheet data found.   Pain contract signed on: Today  Type 2 diabetes mellitus Patient sees endocrinology for this.  Patient comes in today for recheck of his diabetes. Patient has been currently taking Trulicity and glipizide and Metformin. Patient is currently on an ACE inhibitor/ARB. Patient has not seen an ophthalmologist this year. Patient denies any new issues with their feet. The symptom started onset as an adult hypertension hyperlipidemia ARE RELATED TO DM   Hypertension Patient is currently on lisinopril, and their blood pressure today is 144/89. Patient denies any lightheadedness or dizziness. Patient denies headaches, blurred vision, chest pains, shortness of breath, or weakness. Denies any side effects from  medication and is content with current medication.   Hyperlipidemia Patient is coming in for recheck of his hyperlipidemia. The patient is currently taking simvastatin. They deny any issues with myalgias or history of liver damage from it. They deny any focal numbness or weakness or chest pain.   Patient continues to have belching and burping and feels like smells like stool and smells horrible, he has seen GI and they do scopes and did not find anything, he has had motility problems in the past.  Relevant past medical, surgical, family and social history reviewed and updated as indicated. Interim medical history since our last visit reviewed. Allergies and medications reviewed and updated.  Review of Systems  Constitutional: Negative for chills and fever.  Respiratory: Negative for shortness of breath and wheezing.   Cardiovascular: Negative for chest pain and leg swelling.  Gastrointestinal: Positive for abdominal distention and nausea.  Musculoskeletal: Positive for arthralgias. Negative for back pain and gait problem.  Skin: Negative for rash.  Neurological: Negative for dizziness, weakness and numbness.  All other systems reviewed and are negative.   Per HPI unless specifically indicated above   Allergies as of 03/03/2020      Reactions   Fenofibrate Rash   Permament rash.      Medication List       Accurate as of March 03, 2020 10:21 AM. If you have any questions, ask your nurse or doctor.        Accu-Chek Softclix Lancets lancets   CINNAMON PO Take 1 capsule by mouth 2 (two) times daily.  Fish Oil 1200 MG Cpdr Take 1,200 mg by mouth daily.   fluticasone 50 MCG/ACT nasal spray Commonly known as: FLONASE USE 1 SPRAY IN EACH NOSTRIL TWICE DAILY AS NEEDED FOR ALLERGIES OR RHINITIS (SUBSTITUTED FOR FLONASE)   Fluzone Quadrivalent 0.5 ML injection Generic drug: influenza vac split quadrivalent PF   glipiZIDE 5 MG 24 hr tablet Commonly known as: GLUCOTROL  XL Take 1 tablet (5 mg total) by mouth daily with breakfast.   ketoconazole 2 % cream Commonly known as: NIZORAL APPLY 1 APPLICATION TOPICALLY DAILY.   lisinopril 20 MG tablet Commonly known as: ZESTRIL TAKE 1 TABLET EVERY DAY   metFORMIN 1000 MG tablet Commonly known as: GLUCOPHAGE TAKE 1 TABLET (1,000 MG TOTAL) BY MOUTH 2 (TWO) TIMES DAILY WITH A MEAL.   omeprazole 40 MG capsule Commonly known as: PRILOSEC Take 1 capsule (40 mg total) by mouth daily.   Oxycodone HCl 10 MG Tabs Take 1 tablet (10 mg total) by mouth 4 (four) times daily as needed.   pantoprazole 40 MG tablet Commonly known as: PROTONIX Take 1 tablet (40 mg total) by mouth daily.   Restasis 0.05 % ophthalmic emulsion Generic drug: cycloSPORINE SMARTSIG:1 Drop(s) In Eye(s) Every 12 Hours   simvastatin 40 MG tablet Commonly known as: ZOCOR TAKE 1 TABLET EVERY DAY  AT  6  PM   triamcinolone 0.1 % Commonly known as: KENALOG Apply 1 application topically 2 (two) times daily.   Trulicity 1.5 DZ/3.2DJ Sopn Generic drug: Dulaglutide Inject 1.5 mg into the skin once a week.        Objective:   BP (!) 144/89   Pulse 75   Ht 6\' 1"  (1.854 m)   Wt 267 lb 8 oz (121.3 kg)   SpO2 96%   BMI 35.29 kg/m   Wt Readings from Last 3 Encounters:  03/03/20 267 lb 8 oz (121.3 kg)  01/23/20 262 lb 3.2 oz (118.9 kg)  12/03/19 265 lb (120.2 kg)    Physical Exam Vitals and nursing note reviewed.  Constitutional:      General: He is not in acute distress.    Appearance: He is well-developed and well-nourished. He is not diaphoretic.  Eyes:     General: No scleral icterus.       Right eye: No discharge.     Extraocular Movements: EOM normal.     Conjunctiva/sclera: Conjunctivae normal.     Pupils: Pupils are equal, round, and reactive to light.  Neck:     Thyroid: No thyromegaly.  Cardiovascular:     Rate and Rhythm: Normal rate and regular rhythm.     Pulses: Intact distal pulses.     Heart sounds: Normal  heart sounds. No murmur heard.   Pulmonary:     Effort: Pulmonary effort is normal. No respiratory distress.     Breath sounds: Normal breath sounds. No wheezing.  Musculoskeletal:        General: Deformity present. No edema.     Cervical back: Neck supple.     Right ankle: Deformity present. No tenderness. Decreased range of motion.     Left ankle: Deformity present. No tenderness. Decreased range of motion.  Lymphadenopathy:     Cervical: No cervical adenopathy.  Skin:    General: Skin is warm and dry.     Findings: No rash.  Neurological:     Mental Status: He is alert and oriented to person, place, and time.     Coordination: Coordination normal.  Psychiatric:  Mood and Affect: Mood and affect normal.        Behavior: Behavior normal.       Assessment & Plan:   Problem List Items Addressed This Visit      Cardiovascular and Mediastinum   Hypertension associated with diabetes ([place])     Endocrine   Uncontrolled type 2 diabetes mellitus with hyperglycemia ([place]) - Primary   Hyperlipidemia associated with type 2 diabetes mellitus ([facility])     Other   Chronic foot pain   Relevant Medications   Oxycodone HCl 10 MG TABS   Oxycodone HCl 10 MG TABS (Start on 03/31/2020)   Oxycodone HCl 10 MG TABS (Start on 04/30/2020)   Other Relevant Orders   ToxASSURE Select 13 (MW), Urine   Mixed hyperlipidemia      Continued oxycodone, gave refills, he continues see endocrinology.  We decided to go ahead and try starting metoclopramide for motility to see if it helps with his GI symptoms Follow up plan: Return if symptoms worsen or fail to improve, for Pain management and diabetes.  Counseling provided for all of the vaccine components No orders of the defined types were placed in this encounter.   Caryl Pina, MD [facility] 03/03/2020, 10:21 AM

## 2020-03-11 LAB — TOXASSURE SELECT 13 (MW), URINE

## 2020-03-17 ENCOUNTER — Other Ambulatory Visit: Payer: Self-pay

## 2020-03-17 ENCOUNTER — Ambulatory Visit: Payer: Medicare HMO | Admitting: Physician Assistant

## 2020-03-17 ENCOUNTER — Encounter: Payer: Self-pay | Admitting: Physician Assistant

## 2020-03-17 DIAGNOSIS — M65331 Trigger finger, right middle finger: Secondary | ICD-10-CM | POA: Diagnosis not present

## 2020-03-17 MED ORDER — METHYLPREDNISOLONE ACETATE 40 MG/ML IJ SUSP
20.0000 mg | INTRAMUSCULAR | Status: AC | PRN
  Administered 2020-03-17: 20 mg

## 2020-03-17 MED ORDER — LIDOCAINE HCL 1 % IJ SOLN
0.5000 mL | INTRAMUSCULAR | Status: AC | PRN
  Administered 2020-03-17: .5 mL

## 2020-03-17 NOTE — Progress Notes (Signed)
             Procedure Note  Patient: Timothy Nelson.             Date of Birth: [birth year]           MRN: [id number]             Visit Date: 03/17/2020 HPI: Timothy Nelson comes in today new complaint of right middle finger locking up for the last 2 months no known injury.  He is diabetic reports his hemoglobin A1c to be around 7.3.  He has had no injury to the right hand.  He states the finger will does lock up periodically and he has to manually straighten the finger.  No numbness tingling.  History of left carpal tunnel surgery 2015 doing well.  Review of systems see HPI otherwise negative  Physical exam: Right hand active triggering long finger.  No other fingers triggering.  Tenderness over the A1 pulley where there is a palpable nodule.  Sensation grossly intact throughout the hand.  Hands well-perfused.  Radial pulse 2+.  Procedures: Visit Diagnoses:  1. Trigger middle finger of right hand     Hand/UE Inj: R long A1 for trigger finger on 03/17/2020 11:25 AM Medications: 0.5 mL lidocaine 1 %; 20 mg methylPREDNISolone acetate 40 MG/ML     Plan: Patient is given the choice of right trigger finger release, versus splinting versus trigger finger release.  He would like to try cortisone injection.  He will monitor his glucose levels closely over the next couple of days.  He understands he needs to wait at least 3 months between injections.  Questions were encouraged and answered at length today.

## 2020-03-23 ENCOUNTER — Other Ambulatory Visit: Payer: Self-pay | Admitting: Family Medicine

## 2020-04-01 ENCOUNTER — Emergency Department (HOSPITAL_COMMUNITY)
Admission: EM | Admit: 2020-04-01 | Discharge: 2020-04-01 | Disposition: A | Discharge status: Home / Self Care | Payer: Worker's Compensation | Attending: Emergency Medicine | Admitting: Emergency Medicine

## 2020-04-01 ENCOUNTER — Encounter (HOSPITAL_COMMUNITY): Payer: Self-pay | Admitting: *Deleted

## 2020-04-01 ENCOUNTER — Other Ambulatory Visit: Payer: Self-pay

## 2020-04-01 DIAGNOSIS — Y990 Civilian activity done for income or pay: Secondary | ICD-10-CM | POA: Diagnosis not present

## 2020-04-01 DIAGNOSIS — Z79899 Other long term (current) drug therapy: Secondary | ICD-10-CM | POA: Diagnosis not present

## 2020-04-01 DIAGNOSIS — W01198A Fall on same level from slipping, tripping and stumbling with subsequent striking against other object, initial encounter: Secondary | ICD-10-CM | POA: Diagnosis not present

## 2020-04-01 DIAGNOSIS — I10 Essential (primary) hypertension: Secondary | ICD-10-CM | POA: Diagnosis not present

## 2020-04-01 DIAGNOSIS — Z7984 Long term (current) use of oral hypoglycemic drugs: Secondary | ICD-10-CM | POA: Insufficient documentation

## 2020-04-01 DIAGNOSIS — S81811A Laceration without foreign body, right lower leg, initial encounter: Secondary | ICD-10-CM | POA: Diagnosis not present

## 2020-04-01 DIAGNOSIS — Y9229 Other specified public building as the place of occurrence of the external cause: Secondary | ICD-10-CM | POA: Insufficient documentation

## 2020-04-01 DIAGNOSIS — E1140 Type 2 diabetes mellitus with diabetic neuropathy, unspecified: Secondary | ICD-10-CM | POA: Insufficient documentation

## 2020-04-01 DIAGNOSIS — S8991XA Unspecified injury of right lower leg, initial encounter: Secondary | ICD-10-CM | POA: Diagnosis present

## 2020-04-01 DIAGNOSIS — F17290 Nicotine dependence, other tobacco product, uncomplicated: Secondary | ICD-10-CM | POA: Diagnosis not present

## 2020-04-01 MED ORDER — CEPHALEXIN 500 MG PO CAPS: 500.0000 mg | ORAL_CAPSULE | Freq: Four times a day (QID) | ORAL | 0 refills | Status: DC

## 2020-04-01 MED ORDER — LIDOCAINE-EPINEPHRINE (PF) 2 %-1:200000 IJ SOLN
10.0000 mL | Freq: Once | INTRAMUSCULAR | Status: AC
  Administered 2020-04-01: 10 mL
  Filled 2020-04-01: qty 10

## 2020-04-01 NOTE — ED Triage Notes (Addendum)
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 He had on tall, tight socks and pants so he didn't notice a cut last night. When he woke up this morning he was "gushing" blood and saw a laceration to right lower leg. Denies use of blood thinners. Last tetanus shot 4 years ago.

## 2020-04-01 NOTE — Discharge Instructions (Signed)
Watch for signs of infection or poor wound healing.  Follow-up with your doctor in around 7 to 10 days to have the sutures removed.

## 2020-04-01 NOTE — ED Provider Notes (Signed)
[facility] EMERGENCY DEPARTMENT Provider Note   CSN: [id number] Arrival date & time: 04/01/20  0759     History Chief Complaint  Patient presents with  . Extremity Laceration    Timothy Nelson is a 55 y.o. male.  HPI Patient presents with laceration to the right anterior lower leg.  Began last night around 8:00.  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  States he did not really think much of it time.  However this morning he saw some blood on his pants and when he took his socks off it started bleeding more.  Tetanus was around 4 days ago.  Has been ambulatory.  No real pain to the site.  Patient is diabetic.  Has had surgery on his bilateral lower legs/ankles after previous fall.    Past Medical History:  Diagnosis Date  . Arthritis   . Barrett's esophagus   . Chronic pain   . Diabetes ([place])   . Essential hypertension   . Gastroparesis   . GERD (gastroesophageal reflux disease)   . Hyperlipidemia   . Obesity   . Type 2 diabetes mellitus [facility])     Patient Active Problem List   Diagnosis Date Noted  . Carpal tunnel syndrome, left upper limb 05/02/2019  . Mixed hyperlipidemia 10/03/2018  . Diabetic ulcer of right midfoot associated with type 2 diabetes mellitus, with fat layer exposed ([place]) 09/30/2017  . Neuropathy 08/05/2017  . Diabetes mellitus, type 2 ([place]) 08/05/2017  . Poor sleep hygiene 02/04/2017  . 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 Tobacco use disorder 01/19/2017  . Obsessive compulsive disorder 01/19/2017  . Class 2 drug-induced obesity with serious comorbidity and body mass index (BMI) of 35.0 to 35.9 in adult 01/19/2017  . Snoring 01/19/2017  . Excessive daytime sleepiness 01/19/2017  . GERD (gastroesophageal reflux disease) 01/09/2015  . Uncontrolled type 2 diabetes mellitus with hyperglycemia ([place]) 09/11/2014  . Hypertension associated with diabetes ([place]) 09/11/2014  .  Hyperlipidemia associated with type 2 diabetes mellitus ([place]) 09/11/2014  . Chronic foot pain 09/11/2014    Past Surgical History:  Procedure Laterality Date  . ESOPHAGOGASTRODUODENOSCOPY (EGD) WITH PROPOFOL N/A 04/04/2015   Procedure: ESOPHAGOGASTRODUODENOSCOPY (EGD) WITH PROPOFOL;  Surgeon: Rogene Houston, MD;  Location: [facility] ENDO SUITE;  Service: Endoscopy;  Laterality: N/A;  8:30  . FRACTURE SURGERY  March 2007   Left arm, left thumb, bilateral legs  . HIP SURGERY Right   . WRIST FRACTURE SURGERY Left        Family History  Problem Relation Age of Onset  . Diabetes Mother   . Hypertension Mother   . Hyperlipidemia Mother   . Arthritis Mother   . Esophageal cancer Mother        patient unsure  . Diabetes Father   . Heart disease Father        Age 64  . Kidney disease Father   . Hypertension Sister   . Diabetes Daughter   . Colon cancer Neg Hx   . Stomach cancer Neg Hx   . Pancreatic cancer Neg Hx   . Liver disease Neg Hx     Social History   Tobacco Use  . Smoking status: Current Some Day Smoker    Packs/day: 1.00    Years: 20.00    Pack years: 20.00    Types: Cigars  . Smokeless tobacco: Never Used  . Tobacco comment:  Smoked intermittently for 20 years; 1-2 cigars weekly as of 24/2020  Vaping Use  . Vaping Use: Never used  Substance Use Topics  . Alcohol use: Yes    Alcohol/week: 1.0 standard drink    Types: 1 Glasses of wine per week    Comment: glass wine a week.   . Drug use: No    Home Medications Prior to Admission medications   Medication Sig Start Date End Date Taking? Authorizing Provider  cephALEXin (KEFLEX) 500 MG capsule Take 1 capsule (500 mg total) by mouth 4 (four) times daily. 04/01/20  Yes Davonna Belling, MD  Accu-Chek Softclix Lancets lancets  01/31/19   [provider]  CINNAMON PO Take 1 capsule by mouth 2 (two) times daily.    [provider]  cycloSPORINE (RESTASIS) 0.05 % ophthalmic emulsion SMARTSIG:1 Drop(s) In  Eye(s) Every 12 Hours 03/28/19   [provider]  Dulaglutide (TRULICITY) 1.5 FM/3.8GY SOPN Inject 1.5 mg into the skin once a week. 09/20/19   Brita Romp, NP  fluticasone (FLONASE) 50 MCG/ACT nasal spray USE 1 SPRAY IN EACH NOSTRIL TWICE DAILY AS NEEDED FOR ALLERGIES OR RHINITIS (SUBSTITUTED FOR FLONASE) 12/24/19   Dettinger, Fransisca Kaufmann, MD  FLUZONE QUADRIVALENT 0.5 ML injection  11/15/18   [provider]  glipiZIDE (GLUCOTROL XL) 5 MG 24 hr tablet Take 1 tablet (5 mg total) by mouth daily with breakfast. 01/23/20   Brita Romp, NP  ketoconazole (NIZORAL) 2 % cream APPLY 1 APPLICATION TOPICALLY DAILY. 12/06/19   Dettinger, Fransisca Kaufmann, MD  lisinopril (ZESTRIL) 20 MG tablet TAKE 1 TABLET EVERY DAY 01/10/20   Dettinger, Fransisca Kaufmann, MD  metFORMIN (GLUCOPHAGE) 1000 MG tablet TAKE 1 TABLET TWICE DAILY WITH A MEAL 03/24/20   Dettinger, Fransisca Kaufmann, MD  metoCLOPramide (REGLAN) 5 MG tablet Take 1 tablet (5 mg total) by mouth 3 (three) times daily before meals. 03/03/20   Dettinger, Fransisca Kaufmann, MD  Omega-3 Fatty Acids (FISH OIL) 1200 MG CPDR Take 1,200 mg by mouth daily. 12/06/18   Dettinger, Fransisca Kaufmann, MD  omeprazole (PRILOSEC) 40 MG capsule Take 1 capsule (40 mg total) by mouth daily. 10/16/19   Dettinger, Fransisca Kaufmann, MD  Oxycodone HCl 10 MG TABS Take 1 tablet (10 mg total) by mouth 4 (four) times daily as needed. 03/03/20   Dettinger, Fransisca Kaufmann, MD  Oxycodone HCl 10 MG TABS Take 1 tablet (10 mg total) by mouth 4 (four) times daily as needed. 03/31/20   Dettinger, Fransisca Kaufmann, MD  Oxycodone HCl 10 MG TABS Take 1 tablet (10 mg total) by mouth 4 (four) times daily as needed. 04/30/20   Dettinger, Fransisca Kaufmann, MD  pantoprazole (PROTONIX) 40 MG tablet Take 1 tablet (40 mg total) by mouth daily. 09/03/19   Dettinger, Fransisca Kaufmann, MD  simvastatin (ZOCOR) 40 MG tablet TAKE 1 TABLET EVERY DAY  AT  6  PM 03/24/20   Dettinger, Fransisca Kaufmann, MD  triamcinolone cream (KENALOG) 0.1 % Apply 1 application topically 2 (two) times  daily. 12/13/18   Dettinger, Fransisca Kaufmann, MD    Allergies    Fenofibrate  Review of Systems   Review of Systems  Constitutional: Negative for appetite change and fever.  Respiratory: Negative for shortness of breath.   Cardiovascular: Negative for chest pain.  Gastrointestinal: Negative for abdominal pain.  Genitourinary: Negative for flank pain.  Musculoskeletal: Negative for joint swelling.  Skin: Positive for wound.  Neurological: Negative for light-headedness.  Psychiatric/Behavioral: Negative for confusion.  Physical Exam Updated Vital Signs BP 139/89 (BP Location: Right Arm)   Pulse 81   Temp 97.7 F (36.5 C) (Oral)   Resp 16   Ht 6\' 1"  (1.854 m)   Wt 117.9 kg   SpO2 98%   BMI 34.30 kg/m   Physical Exam Vitals and nursing note reviewed.  Constitutional:      Appearance: Normal appearance.  HENT:     Head: Atraumatic.  Musculoskeletal:        General: No deformity.  Skin:    Comments: V-shaped laceration on right anterior lower leg.  Approximately 3 cm long.  No bone seen.  No deformity.  No tenderness over tibia or fibula.  Chronic deformity of right ankle.  Neurological:     Mental Status: He is alert and oriented to person, place, and time.     ED Results / Procedures / Treatments   Labs (all labs ordered are listed, but only abnormal results are displayed) Labs Reviewed - No data to display  EKG None  Radiology No results found.  Procedures .Marland KitchenLaceration Repair  Date/Time: 04/01/2020 10:09 AM Performed by: Davonna Belling, MD Authorized by: Davonna Belling, MD   Consent:    Consent obtained:  Verbal   Consent given by:  Patient   Risks, benefits, and alternatives were discussed: yes     Risks discussed:  Infection, need for additional repair, nerve damage, poor wound healing, poor cosmetic result, pain and retained foreign body   Alternatives discussed:  No treatment and delayed treatment Anesthesia:    Anesthesia method:  Local  infiltration   Local anesthetic:  Lidocaine 1% WITH epi Laceration details:    Location:  Leg   Leg location:  R lower leg   Length (cm):  3 Pre-procedure details:    Preparation:  Patient was prepped and draped in usual sterile fashion Exploration:    Limited defect created (wound extended): no     Hemostasis achieved with:  Epinephrine   Wound exploration: wound explored through full range of motion and entire depth of wound visualized     Wound extent: fascia violated     Wound extent: no foreign bodies/material noted and no underlying fracture noted   Treatment:    Area cleansed with:  Saline   Amount of cleaning:  Standard   Debridement:  None   Layers/structures repaired:  Deep subcutaneous Deep subcutaneous:    Suture size:  4-0   Suture material:  Vicryl   Suture technique:  Simple interrupted   Number of sutures:  2 Skin repair:    Repair method:  Sutures   Suture size:  4-0   Suture material:  Prolene (and ethilon)   Suture technique:  Simple interrupted   Number of sutures:  16 Approximation:    Approximation:  Close Repair type:    Repair type:  Intermediate Post-procedure details:    Dressing:  Sterile dressing   Procedure completion:  Tolerated well, no immediate complications     Medications Ordered in ED Medications  lidocaine-EPINEPHrine (XYLOCAINE W/EPI) 2 %-1:200000 (PF) injection 10 mL (has no administration in time range)    ED Course  I have reviewed the triage vital signs and the nursing notes.  Pertinent labs & imaging results that were available during my care of the patient were reviewed by me and considered in my medical decision making (see chart for details).    MDM Rules/Calculators/A&P  Patient with direct blow to right lower leg.  Around 3 cm laceration.  Wound closed.  Will prophylax with antibiotics due to time of injury of last night and comorbidities.  Wound closed.  Does not appear to need imaging.   Outpatient follow-up as needed. Final Clinical Impression(s) / ED Diagnoses Final diagnoses:  Laceration of right lower extremity, initial encounter    Rx / DC Orders ED Discharge Orders         Ordered    cephALEXin (KEFLEX) 500 MG capsule  4 times daily        04/01/20 1007           Davonna Belling, MD 04/01/20 1011

## 2020-04-02 ENCOUNTER — Ambulatory Visit (INDEPENDENT_AMBULATORY_CARE_PROVIDER_SITE_OTHER): Payer: Medicare HMO

## 2020-04-02 DIAGNOSIS — Z0000 Encounter for general adult medical examination without abnormal findings: Secondary | ICD-10-CM

## 2020-04-02 NOTE — Progress Notes (Signed)
MEDICARE ANNUAL WELLNESS VISIT  04/02/2020  Telephone Visit Disclaimer This Medicare AWV was conducted by telephone due to national recommendations for restrictions regarding the COVID-19 Pandemic (e.g. social distancing).  I verified, using two identifiers, that I am speaking with Timothy Nelson or their authorized healthcare agent. I discussed the limitations, risks, security, and privacy concerns of performing an evaluation and management service by telephone and the potential availability of an in-person appointment in the future. The patient expressed understanding and agreed to proceed.  Location of Patient: Home Location of Provider (nurse):  [facility]  Subjective:    Timothy Nelson is a 55 y.o. male patient of Timothy Nelson, Timothy Kaufmann, MD who had a Medicare Annual Wellness Visit today via telephone. Timothy Nelson is Disabled and lives with their spouse. He has one child. He reports that he is socially active and does interact with friends/family regularly. 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  Patient Care Team: Timothy Nelson, Timothy Kaufmann, MD as PCP - General (Family Medicine) Melina Schools, [place] (Optometry) Jearl Klinefelter, DPM as Referring Physician (Podiatry) Dohmeier, Asencion Partridge, MD as Consulting Physician (Neurology) Ilean China, RN as Registered Nurse  Advanced Directives 04/02/2020 04/01/2020 12/06/2017 09/21/2017 09/29/2016 04/04/2015 04/03/2015  Does Patient Have a Medical Advance Directive? No No No No No No No  Would patient like information on creating a medical advance directive? No - Patient declined No - Patient declined Yes (MAU/Ambulatory/Procedural Areas - Information given) - Yes (ED - Information included in AVS) No - patient declined information -    Hospital Utilization Over the Past 12 Months: # of hospitalizations or ER visits: 1 # of surgeries: 2  Review of Systems    Patient reports that his overall health is better compared to  last year.  History obtained from chart review and the patient  Patient Reported Readings (BP, Pulse, CBG, Weight, etc) none  Pain Assessment Pain : No/denies pain     Current Medications & Allergies (verified) Allergies as of 04/02/2020      Reactions   Fenofibrate Rash   Permament rash.      Medication List       Accurate as of April 02, 2020  3:35 PM. If you have any questions, ask your nurse or doctor.        Accu-Chek Softclix Lancets lancets   cephALEXin 500 MG capsule Commonly known as: KEFLEX Take 1 capsule (500 mg total) by mouth 4 (four) times daily.   CINNAMON PO Take 1 capsule by mouth 2 (two) times daily.   Fish Oil 1200 MG Cpdr Take 1,200 mg by mouth daily.   fluticasone 50 MCG/ACT nasal spray Commonly known as: FLONASE USE 1 SPRAY IN EACH NOSTRIL TWICE DAILY AS NEEDED FOR ALLERGIES OR RHINITIS (SUBSTITUTED FOR FLONASE)   Fluzone Quadrivalent 0.5 ML injection Generic drug: influenza vac split quadrivalent PF   glipiZIDE 5 MG 24 hr tablet Commonly known as: GLUCOTROL XL Take 1 tablet (5 mg total) by mouth daily with breakfast.   ketoconazole 2 % cream Commonly known as: NIZORAL APPLY 1 APPLICATION TOPICALLY DAILY.   lisinopril 20 MG tablet Commonly known as: ZESTRIL TAKE 1 TABLET EVERY DAY   metFORMIN 1000 MG tablet Commonly known as: GLUCOPHAGE TAKE 1 TABLET TWICE DAILY WITH A MEAL   metoCLOPramide 5 MG tablet Commonly known as: REGLAN Take 1 tablet (5 mg total) by mouth 3 (three) times daily before meals.   omeprazole 40 MG capsule Commonly  known as: PRILOSEC Take 1 capsule (40 mg total) by mouth daily.   Oxycodone HCl 10 MG Tabs Take 1 tablet (10 mg total) by mouth 4 (four) times daily as needed.   Oxycodone HCl 10 MG Tabs Take 1 tablet (10 mg total) by mouth 4 (four) times daily as needed.   Oxycodone HCl 10 MG Tabs Take 1 tablet (10 mg total) by mouth 4 (four) times daily as needed. Start taking on: April 30, 2020    pantoprazole 40 MG tablet Commonly known as: PROTONIX Take 1 tablet (40 mg total) by mouth daily.   Restasis 0.05 % ophthalmic emulsion Generic drug: cycloSPORINE SMARTSIG:1 Drop(s) In Eye(s) Every 12 Hours   simvastatin 40 MG tablet Commonly known as: ZOCOR TAKE 1 TABLET EVERY DAY  AT  6  PM   triamcinolone 0.1 % Commonly known as: KENALOG Apply 1 application topically 2 (two) times daily.   Trulicity 1.5 ZT/2.4PY Sopn Generic drug: Dulaglutide Inject 1.5 mg into the skin once a week.       History (reviewed): Past Medical History:  Diagnosis Date  . Arthritis   . Barrett's esophagus   . Chronic pain   . Diabetes ([place])   . Essential hypertension   . Gastroparesis   . GERD (gastroesophageal reflux disease)   . Hyperlipidemia   . Obesity   . Type 2 diabetes mellitus ([place])    Past Surgical History:  Procedure Laterality Date  . ESOPHAGOGASTRODUODENOSCOPY (EGD) WITH PROPOFOL N/A 04/04/2015   Procedure: ESOPHAGOGASTRODUODENOSCOPY (EGD) WITH PROPOFOL;  Surgeon: Rogene Houston, MD;  Location: [facility];  Service: Endoscopy;  Laterality: N/A;  8:30  . FRACTURE SURGERY  March 2007   Left arm, left thumb, bilateral legs  . HIP SURGERY Right   . WRIST FRACTURE SURGERY Left    Family History  Problem Relation Age of Onset  . Diabetes Mother   . Hypertension Mother   . Hyperlipidemia Mother   . Arthritis Mother   . Esophageal cancer Mother        patient unsure  . Diabetes Father   . Heart disease Father        Age 56  . Kidney disease Father   . Hypertension Sister   . Diabetes Daughter   . Colon cancer Neg Hx   . Stomach cancer Neg Hx   . Pancreatic cancer Neg Hx   . Liver disease Neg Hx    Social History   Socioeconomic History  . Marital status: Married    Spouse name: Not on file  . Number of children: 1  . Years of education: Not on file  . Highest education level: Associate degree: occupational, Hotel manager, or vocational program  Occupational  History  . Occupation: Disability  Tobacco Use  . Smoking status: Current Some Day Smoker    Packs/day: 1.00    Years: 20.00    Pack years: 20.00    Types: Cigars  . Smokeless tobacco: Never Used  . Tobacco comment: Smoked intermittently for 20 years; 1-2 cigars weekly as of 24/2020  Vaping Use  . Vaping Use: Never used  Substance and Sexual Activity  . Alcohol use: Yes    Alcohol/week: 1.0 standard drink    Types: 1 Glasses of wine per week    Comment: glass wine a week.   . Drug use: No  . Sexual activity: Yes  Other Topics Concern  . Not on file  Social History Narrative  . Not on file  Social Determinants of Health   Financial Resource Strain: Not on file  Food Insecurity: Not on file  Transportation Needs: Not on file  Physical Activity: Not on file  Stress: Not on file  Social Connections: Not on file    Activities of Daily Living In your present state of health, do you have any difficulty performing the following activities: 04/02/2020  Hearing? N  Vision? N  Difficulty concentrating or making decisions? N  Walking or climbing stairs? N  Dressing or bathing? N  Doing errands, shopping? N  Preparing Food and eating ? N  Using the Toilet? N  In the past six months, have you accidently leaked urine? N  Do you have problems with loss of bowel control? N  Managing your Medications? N  Managing your Finances? N  Housekeeping or managing your Housekeeping? N  Some recent data might be hidden    Patient Education/ Literacy How often do you need to have someone help you when you read instructions, pamphlets, or other written materials from your doctor or pharmacy?: 1 - Never What is the last grade level you completed in school?: 12th grade  Exercise Exercise limited by: orthopedic condition(s)  Diet Patient reports consuming 3 meals a day and 0 snack(s) a day Patient reports that his primary diet is: Regular Patient reports that he does have regular access  to food.   Depression Screen PHQ 2/9 Scores 03/03/2020 12/03/2019 09/03/2019 05/31/2019 01/16/2019 11/08/2018 09/06/2018  PHQ - 2 Score 0 0 0 0 0 0 0  PHQ- 9 Score - - - - - - -     Fall Risk Fall Risk  04/02/2020 03/03/2020 01/23/2020 12/03/2019 09/03/2019  Falls in the past year? 0 0 0 0 0  Number falls in past yr: - - - - -  Comment - - - - -  Injury with Fall? - - - - -  Risk for fall due to : - - - - -  Follow up Falls evaluation completed - Falls evaluation completed - -     Objective:  Timothy Nelson seemed alert and oriented and he participated appropriately during our telephone visit.  Blood Pressure Weight BMI  BP Readings from Last 3 Encounters:  04/01/20 126/79  03/03/20 (!) 144/89  01/23/20 (!) 152/89   Wt Readings from Last 3 Encounters:  04/01/20 260 lb (117.9 kg)  03/03/20 267 lb 8 oz (121.3 kg)  01/23/20 262 lb 3.2 oz (118.9 kg)   BMI Readings from Last 1 Encounters:  04/01/20 34.30 kg/m    *Unable to obtain current vital signs, weight, and BMI due to telephone visit type  Hearing/Vision  . Timothy Nelson did not seem to have difficulty with hearing/understanding during the telephone conversation . Reports that he has not had a formal eye exam by an eye care professional within the past year . Reports that he has not had a formal hearing evaluation within the past year *Unable to fully assess hearing and vision during telephone visit type  Cognitive Function: 6CIT Screen 04/02/2020 12/06/2017  What Year? 0 points 0 points  What month? 0 points 0 points  What time? 0 points 0 points  Count back from 20 0 points 0 points  Months in reverse 0 points 0 points  Repeat phrase 0 points 0 points  Total Score 0 0   (Normal:0-7, Significant for Dysfunction: >8)  Normal Cognitive Function Screening: Yes   Immunization & Health Maintenance Record Immunization History  Administered Date(s) Administered  .  Influenza,inj,Quad PF,6+ Mos 11/15/2018  . Influenza-Unspecified  09/04/2016  . Moderna Sars-Covid-2 Vaccination 03/13/2019, 04/10/2019, 11/06/2019  . Pneumococcal Polysaccharide-23 09/16/2016    Health Maintenance  Topic Date Due  . INFLUENZA VACCINE  04/03/2020 (Originally 08/05/2019)  . Hepatitis C Screening  09/02/2020 (Originally June 22, 1965)  . FOOT EXAM  05/20/2020  . HEMOGLOBIN A1C  07/17/2020  . OPHTHALMOLOGY EXAM  11/18/2020  . TETANUS/TDAP  04/04/2021  . COLONOSCOPY (Pts 45-83yrs Insurance coverage will need to be confirmed)  08/08/2028  . PNEUMOCOCCAL POLYSACCHARIDE VACCINE AGE 51-64 HIGH RISK  Completed  . COVID-19 Vaccine  Completed  . HIV Screening  Completed  . HPV VACCINES  Aged Out       Assessment  This is a routine wellness examination for Timothy Nelson.  Health Maintenance: Due or Overdue There are no preventive care reminders to display for this patient.  Timothy Nelson does not need a referral for Community Assistance: Care Management:   no Social Work:    no Prescription Assistance:  no Nutrition/Diabetes Education:  no   Plan:  Personalized Goals Goals Addressed            This Visit's Progress   . Patient Stated       04/02/2020 AWV Goal: Exercise for General Health   Patient will verbalize understanding of the benefits of increased physical activity:  Exercising regularly is important. It will improve your overall fitness, flexibility, and endurance.  Regular exercise also will improve your overall health. It can help you control your weight, reduce stress, and improve your bone density.  Over the next year, patient will increase physical activity as tolerated with a goal of at least 150 minutes of moderate physical activity per week.   You can tell that you are exercising at a moderate intensity if your heart starts beating faster and you start breathing faster but can still hold a conversation.  Moderate-intensity exercise ideas include:  Walking 1 mile (1.6 km) in about 15  minutes  Biking  Hiking  Golfing  Dancing  Water aerobics  Patient will verbalize understanding of everyday activities that increase physical activity by providing examples like the following: ? Yard work, such as: ? Pushing a Conservation officer, nature ? Raking and bagging leaves ? Washing your car ? Pushing a stroller ? Shoveling snow ? Gardening ? Washing windows or floors  Patient will be able to explain general safety guidelines for exercising:   Before you start a new exercise program, talk with your health care provider.  Do not exercise so much that you hurt yourself, feel dizzy, or get very short of breath.  Wear comfortable clothes and wear shoes with good support.  Drink plenty of water while you exercise to prevent dehydration or heat stroke.  Work out until your breathing and your heartbeat get faster.       Personalized Health Maintenance & Screening Recommendations  Td vaccine  Lung Cancer Screening Recommended: no (Low Dose CT Chest recommended if Age 57-80 years, 30 pack-year currently smoking OR have quit w/in past 15 years) Hepatitis C Screening recommended: no HIV Screening recommended: no  Advanced Directives: Written information was not prepared per patient's request.  Referrals & Orders No orders of the defined types were placed in this encounter.   Follow-up Plan . Follow-up with Timothy Nelson, Timothy Kaufmann, MD as planned     I have personally reviewed and noted the following in the patient's chart:   . Medical and social history . Use of alcohol, tobacco  or illicit drugs  . Current medications and supplements . Functional ability and status . Nutritional status . Physical activity . Advanced directives . List of other physicians . Hospitalizations, surgeries, and ER visits in previous 12 months . Vitals . Screenings to include cognitive, depression, and falls . Referrals and appointments  In addition, I have reviewed and discussed with Timothy Nelson certain preventive protocols, quality metrics, and best practice recommendations. A written personalized care plan for preventive services as well as general preventive health recommendations is available and can be mailed to the patient at his request.      Felicity Coyer, LPN    4/88/8916  Patient declined after visit

## 2020-04-11 ENCOUNTER — Ambulatory Visit (INDEPENDENT_AMBULATORY_CARE_PROVIDER_SITE_OTHER): Payer: Medicare HMO | Admitting: Family Medicine

## 2020-04-11 ENCOUNTER — Encounter: Payer: Self-pay | Admitting: Family Medicine

## 2020-04-11 ENCOUNTER — Other Ambulatory Visit: Payer: Self-pay

## 2020-04-11 VITALS — BP 118/79 | HR 82 | Ht 72.0 in | Wt 260.0 lb

## 2020-04-11 DIAGNOSIS — S81811D Laceration without foreign body, right lower leg, subsequent encounter: Secondary | ICD-10-CM

## 2020-04-11 DIAGNOSIS — S81811A Laceration without foreign body, right lower leg, initial encounter: Secondary | ICD-10-CM | POA: Diagnosis not present

## 2020-04-11 MED ORDER — CEPHALEXIN 500 MG PO CAPS: 500.0000 mg | ORAL_CAPSULE | Freq: Four times a day (QID) | ORAL | 0 refills | Status: DC

## 2020-04-11 NOTE — Progress Notes (Signed)
BP 118/79   Pulse 82   Ht 6' (1.829 m)   Wt 260 lb (117.9 kg)   SpO2 97%   BMI 35.26 kg/m    Subjective:   Patient ID: Timothy Nelson, male    DOB: [birth year], 55 y.o.   MRN: [id number]  HPI: Timothy Nelson is a 55 y.o. male presenting on 04/11/2020 for ER follow up   HPI Patient is coming for ER follow-up for laceration on his right lower extremity.  Patient was in the ED on 04/01/2020 for a laceration that he sustained on 04/01/2018 2 in the evening.  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  He was wearing compression stockings so that probably helped.  He denies any fevers or chills.  There is some redness but he says is not getting worse and just finished a course of oral antibiotics from the emergency department.  He denies any purulent drainage.  Relevant past medical, surgical, family and social history reviewed and updated as indicated. Interim medical history since our last visit reviewed. Allergies and medications reviewed and updated.  Review of Systems  Constitutional: Negative for chills and fever.  Respiratory: Negative for shortness of breath and wheezing.   Cardiovascular: Negative for chest pain and leg swelling.  Musculoskeletal: Negative for back pain and gait problem.  Skin: Positive for color change and wound. Negative for rash.  All other systems reviewed and are negative.   Per HPI unless specifically indicated above   Allergies as of 04/11/2020      Reactions   Fenofibrate Rash   Permament rash.      Medication List       Accurate as of April 11, 2020  4:14 PM. If you have any questions, ask your nurse or doctor.        Accu-Chek Softclix Lancets lancets   cephALEXin 500 MG capsule Commonly known as: KEFLEX Take 1 capsule (500 mg total) by mouth 4 (four) times daily.   CINNAMON PO Take 1 capsule by mouth 2  (two) times daily.   Fish Oil 1200 MG Cpdr Take 1,200 mg by mouth daily.   fluticasone 50 MCG/ACT nasal spray Commonly known as: FLONASE USE 1 SPRAY IN EACH NOSTRIL TWICE DAILY AS NEEDED FOR ALLERGIES OR RHINITIS (SUBSTITUTED FOR FLONASE)   Fluzone Quadrivalent 0.5 ML injection Generic drug: influenza vac split quadrivalent PF   glipiZIDE 5 MG 24 hr tablet Commonly known as: GLUCOTROL XL Take 1 tablet (5 mg total) by mouth daily with breakfast.   ketoconazole 2 % cream Commonly known as: NIZORAL APPLY 1 APPLICATION TOPICALLY DAILY.   lisinopril 20 MG tablet Commonly known as: ZESTRIL TAKE 1 TABLET EVERY DAY   metFORMIN 1000 MG tablet Commonly known as: GLUCOPHAGE TAKE 1 TABLET TWICE DAILY WITH A MEAL   metoCLOPramide 5 MG tablet Commonly known as: REGLAN Take 1 tablet (5 mg total) by mouth 3 (three) times daily before meals.   omeprazole 40 MG capsule Commonly known as: PRILOSEC Take 1 capsule (40 mg total) by mouth daily.   Oxycodone HCl 10 MG Tabs Take 1 tablet (10 mg total) by mouth 4 (four) times daily as needed.   Oxycodone HCl 10 MG Tabs Take 1 tablet (10 mg total) by mouth 4 (four) times daily as needed.   Oxycodone HCl 10 MG Tabs  Take 1 tablet (10 mg total) by mouth 4 (four) times daily as needed. Start taking on: April 30, 2020   pantoprazole 40 MG tablet Commonly known as: PROTONIX Take 1 tablet (40 mg total) by mouth daily.   Restasis 0.05 % ophthalmic emulsion Generic drug: cycloSPORINE SMARTSIG:1 Drop(s) In Eye(s) Every 12 Hours   simvastatin 40 MG tablet Commonly known as: ZOCOR TAKE 1 TABLET EVERY DAY  AT  6  PM   triamcinolone cream 0.1 % Commonly known as: KENALOG Apply 1 application topically 2 (two) times daily.   Trulicity 1.5 EQ/6.8TM Sopn Generic drug: Dulaglutide Inject 1.5 mg into the skin once a week.        Objective:   BP 118/79   Pulse 82   Ht 6' (1.829 m)   Wt 260 lb (117.9 kg)   SpO2 97%   BMI 35.26 kg/m   Wt  Readings from Last 3 Encounters:  04/11/20 260 lb (117.9 kg)  04/01/20 260 lb (117.9 kg)  03/03/20 267 lb 8 oz (121.3 kg)    Physical Exam Vitals and nursing note reviewed.  Constitutional:      Appearance: Normal appearance.  Skin:    General: Skin is warm and dry.     Findings: Wound (5 cm laceration on anterior shin horizontally in a V-shaped.  Does have some edema to it and a mild amount of erythema surrounding it.) present.     Comments: 1+ pitting edema in bilateral lower extremity  Neurological:     Mental Status: He is alert.       Assessment & Plan:   Problem List Items Addressed This Visit   None   Visit Diagnoses    Laceration of right lower extremity, subsequent encounter    -  Primary   Relevant Medications   cephALEXin (KEFLEX) 500 MG capsule      Patient has a laceration of his right shin lower.  Removed 14 sutures, he says 16 or 17 replaced but when he pulled off the initial bandage he thinks a couple came out.  Does not have any overt signs of infection but starting to get body because of the edema so concerned that may be heading towards infection, remove sutures and gave antibiotic.  Gave warning signs for infection and when to return. Follow up plan: Return if symptoms worsen or fail to improve.  Counseling provided for all of the vaccine components No orders of the defined types were placed in this encounter.   Caryl Pina, MD [facility] 04/11/2020, 4:14 PM

## 2020-04-20 ENCOUNTER — Encounter: Payer: Self-pay | Admitting: Family Medicine

## 2020-04-29 DIAGNOSIS — L602 Onychogryphosis: Secondary | ICD-10-CM | POA: Diagnosis not present

## 2020-04-29 DIAGNOSIS — E1142 Type 2 diabetes mellitus with diabetic polyneuropathy: Secondary | ICD-10-CM | POA: Diagnosis not present

## 2020-05-05 ENCOUNTER — Other Ambulatory Visit: Payer: Self-pay | Admitting: Family Medicine

## 2020-05-18 ENCOUNTER — Other Ambulatory Visit: Payer: Self-pay | Admitting: Family Medicine

## 2020-05-18 DIAGNOSIS — J0100 Acute maxillary sinusitis, unspecified: Secondary | ICD-10-CM

## 2020-05-20 DIAGNOSIS — E7800 Pure hypercholesterolemia, unspecified: Secondary | ICD-10-CM | POA: Diagnosis not present

## 2020-05-20 DIAGNOSIS — H5213 Myopia, bilateral: Secondary | ICD-10-CM | POA: Diagnosis not present

## 2020-05-22 ENCOUNTER — Ambulatory Visit: Payer: Medicare HMO | Admitting: Nurse Practitioner

## 2020-05-22 ENCOUNTER — Encounter: Payer: Self-pay | Admitting: Nurse Practitioner

## 2020-05-22 ENCOUNTER — Other Ambulatory Visit: Payer: Self-pay

## 2020-05-22 VITALS — BP 134/72 | HR 79 | Ht 73.0 in | Wt 260.0 lb

## 2020-05-22 DIAGNOSIS — I10 Essential (primary) hypertension: Secondary | ICD-10-CM

## 2020-05-22 DIAGNOSIS — E1165 Type 2 diabetes mellitus with hyperglycemia: Secondary | ICD-10-CM | POA: Diagnosis not present

## 2020-05-22 DIAGNOSIS — E782 Mixed hyperlipidemia: Secondary | ICD-10-CM | POA: Diagnosis not present

## 2020-05-22 LAB — POCT GLYCOSYLATED HEMOGLOBIN (HGB A1C): Hemoglobin A1C: 6.6 % — AB (ref 4.0–5.6)

## 2020-05-22 NOTE — Patient Instructions (Signed)

## 2020-05-22 NOTE — Progress Notes (Signed)
05/22/2020, 8:32 AM   Endocrinology follow-up note   Subjective:    Patient ID: Timothy Nelson, male    DOB: 25-Jul-1965.  Timothy Nelson is being seen in follow-up after he was seen in consultation for management of currently uncontrolled symptomatic diabetes requested by  Dettinger, Fransisca Kaufmann, MD.   Past Medical History:  Diagnosis Date  . Arthritis   . Barrett's esophagus   . Chronic pain   . Diabetes ([place])   . Essential hypertension   . Gastroparesis   . GERD (gastroesophageal reflux disease)   . Hyperlipidemia   . Obesity   . Type 2 diabetes mellitus ([place])     Past Surgical History:  Procedure Laterality Date  . ESOPHAGOGASTRODUODENOSCOPY (EGD) WITH PROPOFOL N/A 04/04/2015   Procedure: ESOPHAGOGASTRODUODENOSCOPY (EGD) WITH PROPOFOL;  Surgeon: Rogene Houston, MD;  Location: [facility];  Service: Endoscopy;  Laterality: N/A;  8:30  . FRACTURE SURGERY  March 2007   Left arm, left thumb, bilateral legs  . HIP SURGERY Right   . WRIST FRACTURE SURGERY Left     Social History   Socioeconomic History  . Marital status: Married    Spouse name: Not on file  . Number of children: 1  . Years of education: Not on file  . Highest education level: Associate degree: occupational, Hotel manager, or vocational program  Occupational History  . Occupation: Disability  Tobacco Use  . Smoking status: Current Some Day Smoker    Packs/day: 1.00    Years: 20.00    Pack years: 20.00    Types: Cigars  . Smokeless tobacco: Never Used  . Tobacco comment: Smoked intermittently for 20 years; 1-2 cigars weekly as of 24/2020  Vaping Use  . Vaping Use: Never used  Substance and Sexual Activity  . Alcohol use: Yes    Alcohol/week: 1.0 standard drink    Types: 1 Glasses of wine per week    Comment: glass wine a week.   . Drug use: No  . Sexual activity: Yes  Other Topics Concern  . Not on file  Social History Narrative  .  Not on file   Social Determinants of Health   Financial Resource Strain: Not on file  Food Insecurity: Not on file  Transportation Needs: Not on file  Physical Activity: Not on file  Stress: Not on file  Social Connections: Not on file    Family History  Problem Relation Age of Onset  . Diabetes Mother   . Hypertension Mother   . Hyperlipidemia Mother   . Arthritis Mother   . Esophageal cancer Mother        patient unsure  . Diabetes Father   . Heart disease Father        Age 34  . Kidney disease Father   . Hypertension Sister   . Diabetes Daughter   . Colon cancer Neg Hx   . Stomach cancer Neg Hx   . Pancreatic cancer Neg Hx   . Liver disease Neg Hx     Outpatient Encounter Medications as of 05/22/2020  Medication Sig  . Accu-Chek Softclix Lancets lancets   . CINNAMON PO Take 1 capsule by mouth 2 (two) times daily.  Marland Kitchen  Dulaglutide (TRULICITY) 1.5 GB/1.5VV SOPN Inject 1.5 mg into the skin once a week.  . fluticasone (FLONASE) 50 MCG/ACT nasal spray USE 1 SPRAY IN EACH NOSTRIL TWICE DAILY AS NEEDED FOR ALLERGIES OR RHINITIS (SUBSTITUTED FOR FLONASE)  . FLUZONE QUADRIVALENT 0.5 ML injection   . glipiZIDE (GLUCOTROL XL) 5 MG 24 hr tablet Take 1 tablet (5 mg total) by mouth daily with breakfast.  . ketoconazole (NIZORAL) 2 % cream APPLY 1 APPLICATION TOPICALLY DAILY.  Marland Kitchen lisinopril (ZESTRIL) 20 MG tablet TAKE 1 TABLET EVERY DAY  . metFORMIN (GLUCOPHAGE) 1000 MG tablet TAKE 1 TABLET TWICE DAILY WITH A MEAL  . metoCLOPramide (REGLAN) 5 MG tablet Take 1 tablet (5 mg total) by mouth 3 (three) times daily before meals.  . Omega-3 Fatty Acids (FISH OIL) 1200 MG CPDR Take 1,200 mg by mouth daily.  . Oxycodone HCl 10 MG TABS Take 1 tablet (10 mg total) by mouth 4 (four) times daily as needed.  . pantoprazole (PROTONIX) 40 MG tablet Take 1 tablet (40 mg total) by mouth daily.  . simvastatin (ZOCOR) 40 MG tablet TAKE 1 TABLET EVERY DAY  AT  6  PM  . triamcinolone cream (KENALOG) 0.1 %  Apply 1 application topically 2 (two) times daily.  . [DISCONTINUED] cephALEXin (KEFLEX) 500 MG capsule Take 1 capsule (500 mg total) by mouth 4 (four) times daily.  . [DISCONTINUED] cycloSPORINE (RESTASIS) 0.05 % ophthalmic emulsion SMARTSIG:1 Drop(s) In Eye(s) Every 12 Hours  . [DISCONTINUED] omeprazole (PRILOSEC) 40 MG capsule Take 1 capsule (40 mg total) by mouth daily.  . [DISCONTINUED] Oxycodone HCl 10 MG TABS Take 1 tablet (10 mg total) by mouth 4 (four) times daily as needed.  . [DISCONTINUED] Oxycodone HCl 10 MG TABS Take 1 tablet (10 mg total) by mouth 4 (four) times daily as needed.   No facility-administered encounter medications on file as of 05/22/2020.    ALLERGIES: Allergies  Allergen Reactions  . Fenofibrate Rash    Permament rash.     VACCINATION STATUS: Immunization History  Administered Date(s) Administered  . Influenza,inj,Quad PF,6+ Mos 11/15/2018  . Influenza-Unspecified 09/04/2016  . Moderna Sars-Covid-2 Vaccination 03/13/2019, 04/10/2019, 11/06/2019  . Pneumococcal Polysaccharide-23 09/16/2016    Diabetes He presents for his follow-up diabetic visit. He has type 2 diabetes mellitus. Onset time: He was diagnosed at approximate age of 29 years. His disease course has been improving. There are no hypoglycemic associated symptoms. Pertinent negatives for hypoglycemia include no confusion, headaches, pallor or seizures. Pertinent negatives for diabetes include no chest pain, no fatigue, no polydipsia, no polyphagia, no polyuria and no weakness. There are no hypoglycemic complications. Symptoms are stable. Diabetic complications include nephropathy (mild). Risk factors for coronary artery disease include diabetes mellitus, dyslipidemia, family history, obesity, male sex, hypertension, sedentary lifestyle and tobacco exposure. Current diabetic treatment includes oral agent (dual therapy). He is compliant with treatment most of the time. His weight is fluctuating  minimally. He is following a generally unhealthy diet. When asked about meal planning, he reported none. He has not had a previous visit with a dietitian. He rarely participates in exercise. (He presents today, with no meter or logs to review.  He has not been monitoring blood glucose routinely (only checks several times during the week, randomly).  His POCT A1c today is 6.6%, improving from last visit of 7.4%.  He is surprised by this because he has gone back to eating "what he wants."  We cut his Glipizide in half at last visit and since  then, he has not had any midday drops in his glucose.) An ACE inhibitor/angiotensin II receptor blocker is being taken. He does not see a podiatrist.Eye exam is current.  Hyperlipidemia This is a chronic problem. The current episode started more than 1 year ago. The problem is uncontrolled. Recent lipid tests were reviewed and are variable. Exacerbating diseases include chronic renal disease, diabetes and obesity. Factors aggravating his hyperlipidemia include fatty foods. Pertinent negatives include no chest pain, myalgias or shortness of breath. Current antihyperlipidemic treatment includes statins. The current treatment provides moderate improvement of lipids. Compliance problems include adherence to diet and adherence to exercise.  Risk factors for coronary artery disease include dyslipidemia, diabetes mellitus, family history, hypertension, male sex, obesity and a sedentary lifestyle.  Hypertension This is a chronic problem. The current episode started more than 1 year ago. The problem has been gradually improving since onset. The problem is controlled. Pertinent negatives include no chest pain, headaches, neck pain, palpitations or shortness of breath. There are no associated agents to hypertension. Risk factors for coronary artery disease include diabetes mellitus, dyslipidemia, male gender, obesity, sedentary lifestyle, family history and smoking/tobacco exposure. Past  treatments include ACE inhibitors. The current treatment provides moderate improvement. There are no compliance problems.  Hypertensive end-organ damage includes kidney disease. Identifiable causes of hypertension include chronic renal disease.    Review of systems  Constitutional: + Minimally fluctuating body weight,  current Body mass index is 34.3 kg/m. , no fatigue, no subjective hyperthermia, no subjective hypothermia Eyes: no blurry vision, no xerophthalmia ENT: no sore throat, no nodules palpated in throat, no dysphagia/odynophagia, no hoarseness Cardiovascular: no chest pain, no shortness of breath, no palpitations, no leg swelling Respiratory: no cough, no shortness of breath Gastrointestinal: no nausea/vomiting/diarrhea (hx of gastroparesis) Musculoskeletal: no muscle/joint aches Skin: no rashes, no hyperemia Neurological: no tremors, no numbness, no tingling, no dizziness Psychiatric: no depression, no anxiety   Objective:    BP 134/72   Pulse 79   Ht 6\' 1"  (1.854 m)   Wt 260 lb (117.9 kg)   BMI 34.30 kg/m   Wt Readings from Last 3 Encounters:  05/22/20 260 lb (117.9 kg)  04/11/20 260 lb (117.9 kg)  04/01/20 260 lb (117.9 kg)    BP Readings from Last 3 Encounters:  05/22/20 134/72  04/11/20 118/79  04/01/20 126/79    Physical Exam- Limited  Constitutional:  Body mass index is 34.3 kg/m. , not in acute distress, normal state of mind Eyes:  EOMI, no exophthalmos Neck: Supple Cardiovascular: RRR, no murmurs, rubs, or gallops, no edema Respiratory: Adequate breathing efforts, no crackles, rales, rhonchi, or wheezing Musculoskeletal: no gross deformities, strength intact in all four extremities, no gross restriction of joint movements Skin:  no rashes, no hyperemia Neurological: no tremor with outstretched hands   CMP ( most recent) CMP     Component Value Date/Time   NA 138 01/18/2020 0842   K 4.9 01/18/2020 0842   CL 99 01/18/2020 0842   CO2 21  01/18/2020 0842   GLUCOSE 167 (H) 01/18/2020 0842   GLUCOSE 208 (H) 04/03/2015 1500   BUN 16 01/18/2020 0842   CREATININE 1.23 01/18/2020 0842   CALCIUM 10.4 (H) 01/18/2020 0842   PROT 7.3 01/18/2020 0842   ALBUMIN 4.8 01/18/2020 0842   AST 18 01/18/2020 0842   ALT 36 01/18/2020 0842   ALKPHOS 95 01/18/2020 0842   BILITOT 0.2 01/18/2020 0842   GFRNONAA 66 01/18/2020 0842   GFRAA 76 01/18/2020 0842  Diabetic Labs (most recent): Lab Results  Component Value Date   HGBA1C 7.4 (H) 01/18/2020   HGBA1C 7.1 (H) 12/03/2019   HGBA1C 7.5 (H) 09/03/2019     Lipid Panel ( most recent) Lipid Panel     Component Value Date/Time   CHOL 152 05/08/2019 0814   TRIG 188 (H) 05/08/2019 0814   HDL 39 (L) 05/08/2019 0814   CHOLHDL 3.9 05/08/2019 0814   LDLCALC 81 05/08/2019 0814   LABVLDL 32 05/08/2019 0814     Lab Results  Component Value Date   TSH 2.010 05/08/2019   TSH 2.13 02/07/2018   TSH 3.510 09/16/2016   TSH 1.270 09/11/2014   FREET4 1.48 05/08/2019     Assessment & Plan:   1) Uncontrolled type 2 diabetes mellitus with hyperglycemia (HCC)  - Timothy Nelson has currently uncontrolled symptomatic type 2 DM since 55 years of age.  He presents today, with no meter or logs to review.  He has not been monitoring blood glucose routinely (only checks several times during the week, randomly).  His POCT A1c today is 6.6%, improving from last visit of 7.4%.  He is surprised by this because he has gone back to eating "what he wants."  We cut his Glipizide in half at last visit and since then, he has not had any midday drops in his glucose.  - I had a long discussion with him about the progressive nature of diabetes and the pathology behind its complications. -his diabetes is complicated by obesity/sedentary life, history of smoking and he remains at a high risk for more acute and chronic complications which include CAD, CVA, CKD, retinopathy, and neuropathy. These are all discussed in  detail with him.  - Nutritional counseling repeated at each appointment due to patients tendency to fall back in to old habits.  - The patient admits there is a room for improvement in their diet and drink choices. -  Suggestion is made for the patient to avoid simple carbohydrates from their diet including Cakes, Sweet Desserts / Pastries, Ice Cream, Soda (diet and regular), Sweet Tea, Candies, Chips, Cookies, Sweet Pastries, Store Bought Juices, Alcohol in Excess of 1-2 drinks a day, Artificial Sweeteners, Coffee Creamer, and "Sugar-free" Products. This will help patient to have stable blood glucose profile and potentially avoid unintended weight gain.   - I encouraged the patient to switch to unprocessed or minimally processed complex starch and increased protein intake (animal or plant source), fruits, and vegetables.   - Patient is advised to stick to a routine mealtimes to eat 3 meals a day and avoid unnecessary snacks (to snack only to correct hypoglycemia).  - he has seen Jearld Fenton, RDN, CDE for diabetes education.  - I have approached him with the following individualized plan to manage his diabetes and patient agrees:   -Given his improved, stable glycemic profile, no changes will be made to his medication regimen today.  He is advised to continue Metformin 1000 mg po twice daily with meals, Glipizide 5 mg XL daily with breakfast, and Trulicity 1.5 mg SQ weekly.    He is urged to start monitoring blood glucose at least once daily, before breakfast and notify the clinic if glucose levels are less than 70 or greater than 200 for 3 tests in a row.  - Specific targets for  A1c;  LDL, HDL, Triglycerides, were discussed with the patient.  2) Blood Pressure /Hypertension:  His blood pressure is controlled to target.  He is advised to  continue Lisinopril 20 mg po daily.    3) Lipids/Hyperlipidemia:  His recent lipid panel on 05/08/19 shows controlled LDL at 81 and triglycerides at 188.   He is advised to continue Simvastatin 40 mg po daily at bedtime and Fish Oil 1200 mg po daily. Side effects and precautions discussed with him. He is advised to avoid fried foods and butter.  Will recheck lipid panel prior to next visit.  4)  Weight/Diet: His Body mass index is 34.3 kg/m.--  clearly complicating his diabetes care.  He is a candidate for modest weight loss.  I discussed with him the fact that loss of 5 - 10% of his  current body weight will have the most impact on his diabetes management.  Exercise, and detailed carbohydrates information provided  -  detailed on discharge instructions.  5) Chronic Care/Health Maintenance: -he is on ACEI/ARB and Statin medications and is encouraged to initiate and continue to follow up with Ophthalmology, Dentist,  Podiatrist at least yearly or according to recommendations, and advised to stay away from smoking.  He is safe he does not believe in flu shots,  I have recommended yearly flu vaccine and pneumonia vaccine at least every 5 years; moderate intensity exercise for up to 150 minutes weekly; and  sleep for at least 7 hours a day.  - he is advised to maintain close follow up with Dettinger, Fransisca Kaufmann, MD for primary care needs, as well as his other providers for optimal and coordinated care.     I spent 30 minutes in the care of the patient today including review of labs from [place], Lipids, Thyroid Function, Hematology (current and previous including abstractions from other facilities); face-to-face time discussing  his blood glucose readings/logs, discussing hypoglycemia and hyperglycemia episodes and symptoms, medications doses, his options of short and long term treatment based on the latest standards of care / guidelines;  discussion about incorporating lifestyle medicine;  and documenting the encounter.    Please refer to Patient Instructions for Blood Glucose Monitoring and Insulin/Medications Dosing Guide"  in media tab for additional  information. Please  also refer to " Patient Self Inventory" in the Media  tab for reviewed elements of pertinent patient history.  [name] participated in the discussions, expressed understanding, and voiced agreement with the above plans.  All questions were answered to his satisfaction. he is encouraged to contact clinic should he have any questions or concerns prior to his return visit.    Follow up plan: - Return in about 4 months (around 09/22/2020) for Diabetes F/U- A1c and UM in office, ABI next visit, Previsit labs, Bring meter and logs.  Rayetta Pigg, [facility] [address] [place], [place] [address] Phone: [phone] Fax: [phone]  05/22/2020, 8:32 AM

## 2020-06-04 ENCOUNTER — Other Ambulatory Visit: Payer: Self-pay

## 2020-06-04 ENCOUNTER — Encounter: Payer: Self-pay | Admitting: Family Medicine

## 2020-06-04 ENCOUNTER — Ambulatory Visit (INDEPENDENT_AMBULATORY_CARE_PROVIDER_SITE_OTHER): Payer: Medicare HMO | Admitting: Family Medicine

## 2020-06-04 VITALS — BP 132/83 | HR 83 | Temp 97.6°F | Ht 73.0 in | Wt 260.0 lb

## 2020-06-04 DIAGNOSIS — E782 Mixed hyperlipidemia: Secondary | ICD-10-CM

## 2020-06-04 DIAGNOSIS — M79671 Pain in right foot: Secondary | ICD-10-CM

## 2020-06-04 DIAGNOSIS — G629 Polyneuropathy, unspecified: Secondary | ICD-10-CM

## 2020-06-04 DIAGNOSIS — E1169 Type 2 diabetes mellitus with other specified complication: Secondary | ICD-10-CM | POA: Diagnosis not present

## 2020-06-04 DIAGNOSIS — I152 Hypertension secondary to endocrine disorders: Secondary | ICD-10-CM

## 2020-06-04 DIAGNOSIS — G8929 Other chronic pain: Secondary | ICD-10-CM

## 2020-06-04 DIAGNOSIS — E1159 Type 2 diabetes mellitus with other circulatory complications: Secondary | ICD-10-CM | POA: Diagnosis not present

## 2020-06-04 MED ORDER — OXYCODONE HCL 10 MG PO TABS: 10.0000 mg | ORAL_TABLET | Freq: Four times a day (QID) | ORAL | 0 refills | Status: DC | PRN

## 2020-06-04 MED ORDER — OXYCODONE HCL 10 MG PO TABS
10.0000 mg | ORAL_TABLET | Freq: Four times a day (QID) | ORAL | 0 refills | Status: DC | PRN
Start: 2020-07-03 — End: 2020-09-03

## 2020-06-04 MED ORDER — OMEPRAZOLE 40 MG PO CPDR: 40.0000 mg | DELAYED_RELEASE_CAPSULE | Freq: Every day | ORAL | 0 refills | Status: DC

## 2020-06-04 MED ORDER — OMEPRAZOLE 40 MG PO CPDR: 40.0000 mg | DELAYED_RELEASE_CAPSULE | Freq: Every day | ORAL | 3 refills | Status: DC

## 2020-06-04 NOTE — Progress Notes (Signed)
BP 132/83   Pulse 83   Temp 97.6 F (36.4 C)   Ht 6\' 1"  (1.854 m)   Wt 260 lb (117.9 kg)   SpO2 94%   BMI 34.30 kg/m    Subjective:   Patient ID: Timothy Nelson, male    DOB: [birth year], 55 y.o.   MRN: [id number]  HPI: Timothy Nelson is a 55 y.o. male presenting on 06/04/2020 for Medical Management of Chronic Issues   HPI Type 2 diabetes mellitus Patient comes in today for recheck of his diabetes. Patient has been currently taking glipizide and Trulicity and metformin, sees endocrinology. Patient is currently on an ACE inhibitor/ARB. Patient has not seen an ophthalmologist this year. Patient denies any new issues with their feet. The symptom started onset as an adult neuropathy and hyperlipidemia and hypertension and history of ulcers ARE RELATED TO DM   Hyperlipidemia Patient is coming in for recheck of his hyperlipidemia. The patient is currently taking simvastatin and fish oil. They deny any issues with myalgias or history of liver damage from it. They deny any focal numbness or weakness or chest pain.   Hypertension Patient is currently on lisinopril, and their blood pressure today is 132/83. Patient denies any lightheadedness or dizziness. Patient denies headaches, blurred vision, chest pains, shortness of breath, or weakness. Denies any side effects from medication and is content with current medication.   GERD Patient is currently on pantoprazole.  He is having a lot of burping and belching and gas and reflux and feels like the pantoprazole is not doing as good as the omeprazole and wants to switch back to omeprazole.  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 -None   No flowsheet data found.   Pain contract signed on: 03/07/20  Relevant past medical, surgical, family and social history reviewed and updated as indicated. Interim medical history since our last visit reviewed. Allergies and medications reviewed and updated.  Review of Systems  Constitutional: Negative for chills and fever.  Eyes: Negative for visual disturbance.  Respiratory: Negative for shortness of breath and wheezing.   Cardiovascular: Negative for chest pain and leg swelling.  Musculoskeletal: Positive for arthralgias. Negative for back pain and gait problem.  Skin: Negative for rash.  Neurological: Positive for numbness. Negative for dizziness, weakness and light-headedness.  All other systems reviewed and are negative.   Per HPI unless specifically indicated above   Allergies as of 06/04/2020      Reactions   Fenofibrate Rash   Permament rash.      Medication List       Accurate as of June 04, 2020  8:21 AM. If you have any questions, ask your nurse or doctor.        STOP taking these medications   pantoprazole 40 MG tablet  Commonly known as: PROTONIX Stopped by: Worthy Rancher, MD     TAKE these medications   Accu-Chek Softclix Lancets lancets   CINNAMON PO Take 1 capsule by mouth 2 (two) times daily.   Fish Oil 1200 MG Cpdr Take 1,200 mg by mouth daily.   fluticasone 50 MCG/ACT nasal spray Commonly known as: FLONASE USE 1 SPRAY IN EACH NOSTRIL TWICE DAILY AS NEEDED FOR  ALLERGIES OR RHINITIS (SUBSTITUTED FOR FLONASE)   Fluzone Quadrivalent 0.5 ML injection Generic drug: influenza vac split quadrivalent PF   glipiZIDE 5 MG 24 hr tablet Commonly known as: GLUCOTROL XL Take 1 tablet (5 mg total) by mouth daily with breakfast.   ketoconazole 2 % cream Commonly known as: NIZORAL APPLY 1 APPLICATION TOPICALLY DAILY.   lisinopril 20 MG tablet Commonly known as: ZESTRIL TAKE 1 TABLET EVERY DAY   metFORMIN 1000 MG tablet Commonly known as: GLUCOPHAGE TAKE 1 TABLET TWICE DAILY WITH A MEAL   metoCLOPramide 5 MG tablet Commonly known as: REGLAN Take 1 tablet (5 mg total) by mouth 3 (three) times daily before meals.   omeprazole 40 MG capsule Commonly known as: PRILOSEC Take 1 capsule (40 mg total) by mouth daily. Started by: Fransisca Kaufmann Trystian Crisanto, MD   Oxycodone HCl 10 MG Tabs Take 1 tablet (10 mg total) by mouth 4 (four) times daily as needed. What changed: Another medication with the same name was added. Make sure you understand how and when to take each. Changed by: Fransisca Kaufmann Delorse Shane, MD   Oxycodone HCl 10 MG Tabs Take 1 tablet (10 mg total) by mouth 4 (four) times daily as needed. Start taking on: July 03, 2020 What changed: You were already taking a medication with the same name, and this prescription was added. Make sure you understand how and when to take each. Changed by: Fransisca Kaufmann Arionne Iams, MD   Oxycodone HCl 10 MG Tabs Take 1 tablet (10 mg total) by mouth 4 (four) times daily as needed. Start taking on: August 03, 2020 What changed: You were already taking a medication with the same name, and this prescription was added. Make sure you understand how and when to take each. Changed by: Fransisca Kaufmann Reena Borromeo, MD   simvastatin 40 MG tablet Commonly known as: ZOCOR TAKE 1 TABLET EVERY DAY  AT  6  PM   triamcinolone cream 0.1 % Commonly known as: KENALOG Apply 1 application topically 2 (two) times daily.   Trulicity 1.5 CH/8.5ID Sopn Generic  drug: Dulaglutide Inject 1.5 mg into the skin once a week.        Objective:   BP 132/83   Pulse 83   Temp 97.6 F (36.4 C)   Ht 6\' 1"  (1.854 m)   Wt 260 lb (117.9 kg)   SpO2 94%   BMI 34.30 kg/m   Wt Readings from Last 3 Encounters:  06/04/20 260 lb (117.9 kg)  05/22/20 260 lb (117.9 kg)  04/11/20 260 lb (117.9 kg)    Physical Exam Vitals and nursing note reviewed.  Constitutional:      General: He is not in acute distress.    Appearance: He is well-developed. He is not diaphoretic.  Eyes:     General: No scleral icterus.    Conjunctiva/sclera: Conjunctivae normal.  Neck:     Thyroid: No thyromegaly.  Cardiovascular:     Rate and Rhythm: Normal rate and regular rhythm.     Heart sounds: Normal heart sounds. No murmur heard.   Pulmonary:  Effort: Pulmonary effort is normal. No respiratory distress.     Breath sounds: Normal breath sounds. No wheezing.  Musculoskeletal:        General: Deformity (Bilateral ankle deformity) present. Normal range of motion.     Cervical back: Neck supple.  Lymphadenopathy:     Cervical: No cervical adenopathy.  Skin:    General: Skin is warm and dry.     Findings: No rash.  Neurological:     Mental Status: He is alert and oriented to person, place, and time.     Coordination: Coordination normal.  Psychiatric:        Behavior: Behavior normal.       Assessment & Plan:   Problem List Items Addressed This Visit      Cardiovascular and Mediastinum   Hypertension associated with diabetes ([place])     Endocrine   Diabetes mellitus, type 2 ([place]) - Primary     Nervous and Auditory   Neuropathy     Other   Chronic foot pain   Relevant Medications   Oxycodone HCl 10 MG TABS   Oxycodone HCl 10 MG TABS (Start on 07/03/2020)   Oxycodone HCl 10 MG TABS (Start on 08/03/2020)   Mixed hyperlipidemia      Will continue oxycodone, continue to see endocrinology.  No change in medicine, seems to be doing well with his diabetes  and blood pressure.  He says he will come and do blood work with them a full panel of this next visit. Follow up plan: Return in about 3 months (around 09/04/2020), or if symptoms worsen or fail to improve, for Pain and hypertension and diabetes.  Counseling provided for all of the vaccine components No orders of the defined types were placed in this encounter.   Caryl Pina, MD [facility] 06/04/2020, 8:21 AM

## 2020-06-06 ENCOUNTER — Other Ambulatory Visit: Payer: Self-pay | Admitting: Family Medicine

## 2020-06-20 ENCOUNTER — Encounter: Payer: Self-pay | Admitting: Family Medicine

## 2020-07-11 ENCOUNTER — Encounter: Payer: Self-pay | Admitting: Family Medicine

## 2020-07-15 ENCOUNTER — Encounter: Payer: Self-pay | Admitting: Family Medicine

## 2020-07-17 ENCOUNTER — Encounter: Payer: Self-pay | Admitting: Family Medicine

## 2020-07-17 ENCOUNTER — Ambulatory Visit (INDEPENDENT_AMBULATORY_CARE_PROVIDER_SITE_OTHER): Payer: Medicare HMO | Admitting: Family Medicine

## 2020-07-17 ENCOUNTER — Other Ambulatory Visit: Payer: Self-pay

## 2020-07-17 VITALS — BP 124/74 | HR 78 | Ht 73.0 in | Wt 257.0 lb

## 2020-07-17 DIAGNOSIS — F4329 Adjustment disorder with other symptoms: Secondary | ICD-10-CM | POA: Diagnosis not present

## 2020-07-17 NOTE — Progress Notes (Signed)
BP 124/74   Pulse 78   Ht 6\' 1"  (1.854 m)   Wt 257 lb (116.6 kg)   SpO2 98%   BMI 33.91 kg/m    Subjective:   Patient ID: Timothy Nelson, male    DOB: 1965-03-01, 55 y.o.   MRN: [id number]  HPI: Trentin Knappenberger is a 55 y.o. male presenting on 07/17/2020 for Anxiety (Stress from mom- dementia/DM pt. Called 911. Mom now is psychiatric center)   HPI Stress and anxiety Patient is coming in today for stress and anxiety.  He is assisting with a lot from his mother.  His mother recently had an outburst where she cut herself and then ended up in a psychiatric facility and says she wants nothing to do with him after they had a small argument.  He says that this happened a few days ago and he was very stressed out and that is why he made the appointment but he says he is come down off of it and come to terms with it to some extent.  Patient denies any suicidal ideations and self. Depression screen [facility] 2/9 07/17/2020 06/04/2020 04/11/2020 04/02/2020 03/03/2020  Decreased Interest 0 0 0 0 0  Down, Depressed, Hopeless 0 0 0 0 0  PHQ - 2 Score 0 0 0 0 0  Altered sleeping 1 0 - - -  Tired, decreased energy 0 1 - - -  Change in appetite 0 0 - - -  Feeling bad or failure about yourself  0 0 - - -  Trouble concentrating 0 0 - - -  Moving slowly or fidgety/restless 0 0 - - -  Suicidal thoughts 0 0 - - -  PHQ-9 Score 1 1 - - -  Difficult doing work/chores - Somewhat difficult - - -  Some recent data might be hidden     Relevant past medical, surgical, family and social history reviewed and updated as indicated. Interim medical history since our last visit reviewed. Allergies and medications reviewed and updated.  Review of Systems  Constitutional:  Negative for chills and fever.  Eyes:  Negative for visual disturbance.  Respiratory:  Negative for shortness of breath and wheezing.   Cardiovascular:  Negative for chest pain and leg swelling.  Skin:  Negative for rash.  Psychiatric/Behavioral:  Positive for  dysphoric mood. Negative for self-injury, sleep disturbance and suicidal ideas. The patient is nervous/anxious.   All other systems reviewed and are negative.  Per HPI unless specifically indicated above   Allergies as of 07/17/2020       Reactions   Fenofibrate Rash   Permament rash.        Medication List        Accurate as of July 17, 2020  9:52 AM. If you have any questions, ask your nurse or doctor.          Accu-Chek Softclix Lancets lancets   CINNAMON PO Take 1 capsule by mouth 2 (two) times daily.   Fish Oil 1200 MG Cpdr Take 1,200 mg by mouth daily.   fluticasone 50 MCG/ACT nasal spray Commonly known as: FLONASE USE 1 SPRAY IN EACH NOSTRIL TWICE DAILY AS NEEDED FOR ALLERGIES OR RHINITIS (SUBSTITUTED FOR FLONASE)   Fluzone Quadrivalent 0.5 ML injection Generic drug: influenza vac split quadrivalent PF   glipiZIDE 5 MG 24 hr tablet Commonly known as: GLUCOTROL XL Take 1 tablet (5 mg total) by mouth daily with breakfast.   ketoconazole 2 % cream Commonly known as: NIZORAL APPLY 1  APPLICATION TOPICALLY DAILY.   lisinopril 20 MG tablet Commonly known as: ZESTRIL TAKE 1 TABLET EVERY DAY   metFORMIN 1000 MG tablet Commonly known as: GLUCOPHAGE TAKE 1 TABLET TWICE DAILY WITH A MEAL   metoCLOPramide 5 MG tablet Commonly known as: REGLAN Take 1 tablet (5 mg total) by mouth 3 (three) times daily before meals.   omeprazole 40 MG capsule Commonly known as: PRILOSEC Take 1 capsule (40 mg total) by mouth daily.   Oxycodone HCl 10 MG Tabs Take 1 tablet (10 mg total) by mouth 4 (four) times daily as needed.   Oxycodone HCl 10 MG Tabs Take 1 tablet (10 mg total) by mouth 4 (four) times daily as needed.   Oxycodone HCl 10 MG Tabs Take 1 tablet (10 mg total) by mouth 4 (four) times daily as needed. Start taking on: August 03, 2020   simvastatin 40 MG tablet Commonly known as: ZOCOR TAKE 1 TABLET EVERY DAY  AT  6  PM   triamcinolone cream 0.1 % Commonly  known as: KENALOG Apply 1 application topically 2 (two) times daily.   Trulicity 1.5 TX/6.4WO Sopn Generic drug: Dulaglutide Inject 1.5 mg into the skin once a week.         Objective:   BP 124/74   Pulse 78   Ht 6\' 1"  (1.854 m)   Wt 257 lb (116.6 kg)   SpO2 98%   BMI 33.91 kg/m   Wt Readings from Last 3 Encounters:  07/17/20 257 lb (116.6 kg)  06/04/20 260 lb (117.9 kg)  05/22/20 260 lb (117.9 kg)    Physical Exam Vitals and nursing note reviewed.  Constitutional:      General: He is not in acute distress.    Appearance: Normal appearance. He is well-developed. He is not diaphoretic.  Eyes:     General: No scleral icterus.    Conjunctiva/sclera: Conjunctivae normal.  Neck:     Thyroid: No thyromegaly.  Cardiovascular:     Rate and Rhythm: Normal rate and regular rhythm.     Heart sounds: Normal heart sounds. No murmur heard. Pulmonary:     Effort: Pulmonary effort is normal. No respiratory distress.     Breath sounds: Normal breath sounds. No wheezing.  Musculoskeletal:        General: Normal range of motion.  Skin:    General: Skin is warm and dry.     Findings: No rash.  Neurological:     Mental Status: He is alert and oriented to person, place, and time.     Coordination: Coordination normal.  Psychiatric:        Behavior: Behavior normal.      Assessment & Plan:   Problem List Items Addressed This Visit   None Visit Diagnoses     Stress and adjustment reaction    -  Primary     Patient says he just wanted to talk and does not want any treatment for stress or anxiety at this point and he feels like he is doing better but will follow-up if he needs to.  Follow up plan: Return if symptoms worsen or fail to improve.  Counseling provided for all of the vaccine components No orders of the defined types were placed in this encounter.   Caryl Pina, MD [facility] 07/17/2020, 9:52 AM

## 2020-07-29 DIAGNOSIS — M2041 Other hammer toe(s) (acquired), right foot: Secondary | ICD-10-CM | POA: Diagnosis not present

## 2020-07-29 DIAGNOSIS — M2042 Other hammer toe(s) (acquired), left foot: Secondary | ICD-10-CM | POA: Diagnosis not present

## 2020-07-29 DIAGNOSIS — L602 Onychogryphosis: Secondary | ICD-10-CM | POA: Diagnosis not present

## 2020-07-29 DIAGNOSIS — E1142 Type 2 diabetes mellitus with diabetic polyneuropathy: Secondary | ICD-10-CM | POA: Diagnosis not present

## 2020-08-13 ENCOUNTER — Other Ambulatory Visit: Payer: Self-pay | Admitting: Nurse Practitioner

## 2020-08-18 ENCOUNTER — Other Ambulatory Visit: Payer: Self-pay

## 2020-08-18 DIAGNOSIS — E1165 Type 2 diabetes mellitus with hyperglycemia: Secondary | ICD-10-CM

## 2020-08-18 MED ORDER — GLIPIZIDE ER 5 MG PO TB24: 5.0000 mg | ORAL_TABLET | Freq: Every day | ORAL | 0 refills | Status: DC

## 2020-08-19 ENCOUNTER — Other Ambulatory Visit: Payer: Self-pay | Admitting: *Deleted

## 2020-08-19 MED ORDER — METOCLOPRAMIDE HCL 5 MG PO TABS: 5.0000 mg | ORAL_TABLET | Freq: Three times a day (TID) | ORAL | 0 refills | Status: DC

## 2020-08-21 ENCOUNTER — Other Ambulatory Visit: Payer: Self-pay

## 2020-08-21 ENCOUNTER — Other Ambulatory Visit: Payer: Self-pay | Admitting: Family Medicine

## 2020-08-21 DIAGNOSIS — E1165 Type 2 diabetes mellitus with hyperglycemia: Secondary | ICD-10-CM

## 2020-08-21 MED ORDER — GLIPIZIDE ER 5 MG PO TB24: 5.0000 mg | ORAL_TABLET | Freq: Every day | ORAL | 0 refills | Status: DC

## 2020-09-03 ENCOUNTER — Ambulatory Visit (INDEPENDENT_AMBULATORY_CARE_PROVIDER_SITE_OTHER): Payer: Medicare HMO | Admitting: Family Medicine

## 2020-09-03 ENCOUNTER — Other Ambulatory Visit: Payer: Self-pay

## 2020-09-03 ENCOUNTER — Encounter: Payer: Self-pay | Admitting: Family Medicine

## 2020-09-03 VITALS — BP 140/82 | HR 86 | Ht 73.0 in | Wt 259.0 lb

## 2020-09-03 DIAGNOSIS — G8929 Other chronic pain: Secondary | ICD-10-CM

## 2020-09-03 DIAGNOSIS — I152 Hypertension secondary to endocrine disorders: Secondary | ICD-10-CM | POA: Diagnosis not present

## 2020-09-03 DIAGNOSIS — E1169 Type 2 diabetes mellitus with other specified complication: Secondary | ICD-10-CM | POA: Diagnosis not present

## 2020-09-03 DIAGNOSIS — Z23 Encounter for immunization: Secondary | ICD-10-CM

## 2020-09-03 DIAGNOSIS — E1159 Type 2 diabetes mellitus with other circulatory complications: Secondary | ICD-10-CM | POA: Diagnosis not present

## 2020-09-03 DIAGNOSIS — E1165 Type 2 diabetes mellitus with hyperglycemia: Secondary | ICD-10-CM | POA: Diagnosis not present

## 2020-09-03 DIAGNOSIS — M79671 Pain in right foot: Secondary | ICD-10-CM | POA: Diagnosis not present

## 2020-09-03 LAB — BAYER DCA HB A1C WAIVED: HB A1C (BAYER DCA - WAIVED): 6.5 % (ref <7.0)

## 2020-09-03 MED ORDER — OXYCODONE HCL 10 MG PO TABS: 10.0000 mg | ORAL_TABLET | Freq: Four times a day (QID) | ORAL | 0 refills | Status: DC | PRN

## 2020-09-03 MED ORDER — OXYCODONE HCL 10 MG PO TABS
10.0000 mg | ORAL_TABLET | Freq: Four times a day (QID) | ORAL | 0 refills | Status: DC | PRN
Start: 2020-11-02 — End: 2020-12-03

## 2020-09-03 MED ORDER — OXYCODONE HCL 10 MG PO TABS
10.0000 mg | ORAL_TABLET | Freq: Four times a day (QID) | ORAL | 0 refills | Status: DC | PRN
Start: 2020-10-03 — End: 2020-12-03

## 2020-09-03 MED ORDER — GLUCOSE BLOOD VI STRP: ORAL_STRIP | 3 refills | Status: DC

## 2020-09-03 NOTE — Progress Notes (Signed)
BP 140/82   Pulse 86   Ht '6\' 1"'$  (1.854 m)   Wt 259 lb (117.5 kg)   SpO2 98%   BMI 34.17 kg/m    Subjective:   Patient ID: Timothy Nelson, male    DOB: 1965-12-28, 55 y.o.   MRN: [id number]  HPI: Timothy Nelson is a 55 y.o. male presenting on 09/03/2020 for Medical Management of Chronic Issues, Diabetes, and Hypertension   HPI Type 2 diabetes mellitus Patient comes in today for recheck of his diabetes. Patient has been currently taking metformin and glipizide and Trulicity. Patient is currently on an ACE inhibitor/ARB. Patient has not seen an ophthalmologist this year. Patient denies any new issues with their feet. The symptom started onset as an adult hypertension ARE RELATED TO DM   Hypertension Patient is currently on lisinopril, and their blood pressure today is 140/82. Patient denies any lightheadedness or dizziness. Patient denies headaches, blurred vision, chest pains, shortness of breath, or weakness. Denies any side effects from medication and is content with current medication.   Pain assessment: Cause of pain-bilateral foot degeneration because of fractures in the past Pain location-both ankles and feet Pain on scale of 1-10- 6 Frequency-Daily What increases pain-being on his feet for prolonged periods, without shoes What makes pain Better-oxycodone rest Effects on ADL -limits some but he is able to figure out how to do a lot as long as he gets good shoes Any change in general medical condition-none  Current opioids rx-oxycodone 10 mg 4 times daily as needed # meds rx-120 Effectiveness of current meds-works well Adverse reactions from pain meds-none Morphine equivalent-60  Pill count performed-No Last drug screen -03/07/2020 ( high risk q44m moderate risk q650mlow risk yearly ) Urine drug screen today- No [place] the [facility]  If yes were their any concerning findings? -None   No flowsheet data found.   Pain contract signed on: 03/07/2020  Relevant past  medical, surgical, family and social history reviewed and updated as indicated. Interim medical history since our last visit reviewed. Allergies and medications reviewed and updated.  Review of Systems  Constitutional:  Negative for chills and fever.  Respiratory:  Negative for shortness of breath and wheezing.   Cardiovascular:  Negative for chest pain and leg swelling.  Musculoskeletal:  Positive for arthralgias and gait problem. Negative for back pain.  Skin:  Negative for rash.  Neurological:  Negative for dizziness and weakness.  All other systems reviewed and are negative.  Per HPI unless specifically indicated above   Allergies as of 09/03/2020       Reactions   Fenofibrate Rash   Permament rash.        Medication List        Accurate as of September 03, 2020 10:13 AM. If you have any questions, ask your nurse or doctor.          Accu-Chek Softclix Lancets lancets   CINNAMON PO Take 1 capsule by mouth 2 (two) times daily.   Fish Oil 1200 MG Cpdr Take 1,200 mg by mouth daily.   fluticasone 50 MCG/ACT nasal spray Commonly known as: FLONASE USE 1 SPRAY IN EACH NOSTRIL TWICE DAILY AS NEEDED FOR ALLERGIES OR RHINITIS (SUBSTITUTED FOR FLONASE)   Fluzone Quadrivalent 0.5 ML injection Generic drug: influenza vac split quadrivalent PF   glipiZIDE 5 MG 24 hr tablet Commonly known as: GLUCOTROL XL Take 1 tablet (5 mg total) by mouth daily with breakfast.   glucose blood test strip Use as  instructed Started by: Fransisca Kaufmann Pape Parson, MD   ketoconazole 2 % cream Commonly known as: NIZORAL APPLY 1 APPLICATION TOPICALLY DAILY.   lisinopril 20 MG tablet Commonly known as: ZESTRIL TAKE 1 TABLET EVERY DAY   metFORMIN 1000 MG tablet Commonly known as: GLUCOPHAGE TAKE 1 TABLET TWICE DAILY WITH A MEAL   metoCLOPramide 5 MG tablet Commonly known as: REGLAN Take 1 tablet (5 mg total) by mouth 3 (three) times daily before meals.   omeprazole 40 MG capsule Commonly  known as: PRILOSEC Take 1 capsule (40 mg total) by mouth daily.   Oxycodone HCl 10 MG Tabs Take 1 tablet (10 mg total) by mouth 4 (four) times daily as needed. What changed: Another medication with the same name was changed. Make sure you understand how and when to take each. Changed by: Fransisca Kaufmann Ryanne Morand, MD   Oxycodone HCl 10 MG Tabs Take 1 tablet (10 mg total) by mouth 4 (four) times daily as needed. Start taking on: October 03, 2020 What changed: These instructions start on October 03, 2020. If you are unsure what to do until then, ask your doctor or other care provider. Changed by: Fransisca Kaufmann Kamaile Zachow, MD   Oxycodone HCl 10 MG Tabs Take 1 tablet (10 mg total) by mouth 4 (four) times daily as needed. Start taking on: November 02, 2020 What changed: These instructions start on November 02, 2020. If you are unsure what to do until then, ask your doctor or other care provider. Changed by: Fransisca Kaufmann Jaise Moser, MD   simvastatin 40 MG tablet Commonly known as: ZOCOR TAKE 1 TABLET EVERY DAY AT 6 PM   triamcinolone cream 0.1 % Commonly known as: KENALOG Apply 1 application topically 2 (two) times daily.   Trulicity 1.5 0000000 Sopn Generic drug: Dulaglutide INJECT 1.5 MG INTO THE SKIN ONCE A WEEK.         Objective:   BP 140/82   Pulse 86   Ht '6\' 1"'$  (1.854 m)   Wt 259 lb (117.5 kg)   SpO2 98%   BMI 34.17 kg/m   Wt Readings from Last 3 Encounters:  09/03/20 259 lb (117.5 kg)  07/17/20 257 lb (116.6 kg)  06/04/20 260 lb (117.9 kg)    Physical Exam Vitals and nursing note reviewed.  Constitutional:      General: He is not in acute distress.    Appearance: He is well-developed. He is not diaphoretic.  Eyes:     General: No scleral icterus.    Conjunctiva/sclera: Conjunctivae normal.  Neck:     Thyroid: No thyromegaly.  Cardiovascular:     Rate and Rhythm: Normal rate and regular rhythm.     Heart sounds: Normal heart sounds. No murmur heard. Pulmonary:      Effort: Pulmonary effort is normal. No respiratory distress.     Breath sounds: Normal breath sounds. No wheezing.  Musculoskeletal:     Cervical back: Neck supple.  Lymphadenopathy:     Cervical: No cervical adenopathy.  Skin:    General: Skin is warm and dry.     Findings: No rash.  Neurological:     Mental Status: He is alert and oriented to person, place, and time.     Coordination: Coordination normal.  Psychiatric:        Behavior: Behavior normal.    Diabetic Foot Exam - Simple   Simple Foot Form Diabetic Foot exam was performed with the following findings: Yes 09/03/2020 10:11 AM  Visual Inspection See comments: Yes  Sensation Testing See comments: Yes Pulse Check Posterior Tibialis and Dorsalis pulse intact bilaterally: Yes Comments Patient has complete lack of sensation in his right foot due to fractures and deformities.  He has both ankle deformities where he is inverted on both ankles chronically because of history of fractures      Assessment & Plan:   Problem List Items Addressed This Visit       Cardiovascular and Mediastinum   Hypertension associated with diabetes ([place])     Endocrine   Diabetes mellitus, type 2 ([place]) - Primary   Relevant Medications   glucose blood test strip   Other Relevant Orders   Bayer DCA Hb A1c Waived     Other   Chronic foot pain   Relevant Medications   Oxycodone HCl 10 MG TABS   Oxycodone HCl 10 MG TABS (Start on 11/02/2020)   Oxycodone HCl 10 MG TABS (Start on 10/03/2020)   Other Visit Diagnoses     Need for shingles vaccine       Relevant Orders   Varicella-zoster vaccine IM (Shingrix)       A1c looks good at 6.5, no change medication, refilled chronic pain medicine Follow up plan: Return in about 3 months (around 12/03/2020), or if symptoms worsen or fail to improve, for Chronic pain and diabetes.  Counseling provided for all of the vaccine components Orders Placed This Encounter  Procedures    Varicella-zoster vaccine IM (Shingrix)   Bayer DCA Hb A1c Ehrenfeld, MD [facility] 09/03/2020, 10:13 AM

## 2020-09-04 LAB — T4, FREE: Free T4: 1.38 ng/dL (ref 0.82–1.77)

## 2020-09-04 LAB — LIPID PANEL
Chol/HDL Ratio: 3.8 ratio (ref 0.0–5.0)
Cholesterol, Total: 136 mg/dL (ref 100–199)
HDL: 36 mg/dL — ABNORMAL LOW (ref >39)
LDL Chol Calc (NIH): 68 mg/dL (ref 0–99)
Triglycerides: 194 mg/dL — ABNORMAL HIGH (ref 0–149)
VLDL Cholesterol Cal: 32 mg/dL (ref 5–40)

## 2020-09-04 LAB — COMPREHENSIVE METABOLIC PANEL
ALT: 78 IU/L — ABNORMAL HIGH (ref 0–44)
AST: 34 IU/L (ref 0–40)
Albumin/Globulin Ratio: 1.9 (ref 1.2–2.2)
Albumin: 4.5 g/dL (ref 3.8–4.9)
Alkaline Phosphatase: 87 IU/L (ref 44–121)
BUN/Creatinine Ratio: 16 (ref 9–20)
BUN: 16 mg/dL (ref 6–24)
Bilirubin Total: 0.3 mg/dL (ref 0.0–1.2)
CO2: 22 mmol/L (ref 20–29)
Calcium: 9.5 mg/dL (ref 8.7–10.2)
Chloride: 101 mmol/L (ref 96–106)
Creatinine, Ser: 0.99 mg/dL (ref 0.76–1.27)
Globulin, Total: 2.4 g/dL (ref 1.5–4.5)
Glucose: 141 mg/dL — ABNORMAL HIGH (ref 65–99)
Potassium: 4.6 mmol/L (ref 3.5–5.2)
Sodium: 138 mmol/L (ref 134–144)
Total Protein: 6.9 g/dL (ref 6.0–8.5)
eGFR: 90 mL/min/{1.73_m2} (ref >59)

## 2020-09-04 LAB — TSH: TSH: 4.17 u[IU]/mL (ref 0.450–4.500)

## 2020-09-04 LAB — VITAMIN D 25 HYDROXY (VIT D DEFICIENCY, FRACTURES): Vit D, 25-Hydroxy: 42.6 ng/mL (ref 30.0–100.0)

## 2020-09-05 ENCOUNTER — Other Ambulatory Visit: Payer: Self-pay | Admitting: *Deleted

## 2020-09-05 DIAGNOSIS — E1169 Type 2 diabetes mellitus with other specified complication: Secondary | ICD-10-CM

## 2020-09-05 MED ORDER — GLUCOSE BLOOD VI STRP: ORAL_STRIP | 3 refills | Status: DC

## 2020-09-05 NOTE — Telephone Encounter (Signed)
Fax from [facility] Clarification for directions on test strips Rx corrected and resent

## 2020-09-09 ENCOUNTER — Other Ambulatory Visit: Payer: Self-pay

## 2020-09-09 DIAGNOSIS — E1169 Type 2 diabetes mellitus with other specified complication: Secondary | ICD-10-CM

## 2020-09-09 MED ORDER — GLUCOSE BLOOD VI STRP: ORAL_STRIP | 9 refills | Status: DC

## 2020-09-10 ENCOUNTER — Other Ambulatory Visit: Payer: Self-pay

## 2020-09-10 DIAGNOSIS — E1169 Type 2 diabetes mellitus with other specified complication: Secondary | ICD-10-CM

## 2020-09-10 MED ORDER — GLUCOSE BLOOD VI STRP: ORAL_STRIP | 3 refills | Status: DC

## 2020-09-22 ENCOUNTER — Encounter: Payer: Self-pay | Admitting: Nurse Practitioner

## 2020-09-22 ENCOUNTER — Ambulatory Visit: Payer: Medicare HMO | Admitting: Nurse Practitioner

## 2020-09-22 VITALS — BP 151/90 | HR 79 | Ht 73.0 in | Wt 261.4 lb

## 2020-09-22 DIAGNOSIS — E782 Mixed hyperlipidemia: Secondary | ICD-10-CM | POA: Diagnosis not present

## 2020-09-22 DIAGNOSIS — I10 Essential (primary) hypertension: Secondary | ICD-10-CM | POA: Diagnosis not present

## 2020-09-22 DIAGNOSIS — E1165 Type 2 diabetes mellitus with hyperglycemia: Secondary | ICD-10-CM

## 2020-09-22 MED ORDER — TRULICITY 1.5 MG/0.5ML ~~LOC~~ SOAJ: 1.5000 mg | SUBCUTANEOUS | 3 refills | Status: DC

## 2020-09-22 NOTE — Patient Instructions (Signed)

## 2020-09-22 NOTE — Progress Notes (Signed)
09/22/2020, 9:04 AM   Endocrinology follow-up note   Subjective:    Patient ID: Timothy Nelson, male    DOB: 1965/01/31.  Hilmer Aliberti is being seen in follow-up after he was seen in consultation for management of currently uncontrolled symptomatic diabetes requested by  Dettinger, Fransisca Kaufmann, MD.   Past Medical History:  Diagnosis Date   Arthritis    Barrett's esophagus    Chronic pain    Diabetes ([place])    Essential hypertension    Gastroparesis    GERD (gastroesophageal reflux disease)    Hyperlipidemia    Obesity    Type 2 diabetes mellitus ([place])     Past Surgical History:  Procedure Laterality Date   ESOPHAGOGASTRODUODENOSCOPY (EGD) WITH PROPOFOL N/A 04/04/2015   Procedure: ESOPHAGOGASTRODUODENOSCOPY (EGD) WITH PROPOFOL;  Surgeon: Rogene Houston, MD;  Location: [facility];  Service: Endoscopy;  Laterality: N/A;  8:30   FRACTURE SURGERY  March 2007   Left arm, left thumb, bilateral legs   HIP SURGERY Right    WRIST FRACTURE SURGERY Left     Social History   Socioeconomic History   Marital status: Married    Spouse name: Not on file   Number of children: 1   Years of education: Not on file   Highest education level: Associate degree: occupational, Hotel manager, or vocational program  Occupational History   Occupation: Disability  Tobacco Use   Smoking status: Some Days    Packs/day: 1.00    Years: 20.00    Pack years: 20.00    Types: Cigars, Cigarettes   Smokeless tobacco: Never   Tobacco comments:    Smoked intermittently for 20 years; 1-2 cigars weekly as of 24/2020  Vaping Use   Vaping Use: Never used  Substance and Sexual Activity   Alcohol use: Yes    Alcohol/week: 1.0 standard drink    Types: 1 Glasses of wine per week    Comment: glass wine a week.    Drug use: No   Sexual activity: Yes  Other Topics Concern   Not on file  Social History Narrative   Not on file   Social  Determinants of Health   Financial Resource Strain: Not on file  Food Insecurity: Not on file  Transportation Needs: Not on file  Physical Activity: Not on file  Stress: Not on file  Social Connections: Not on file    Family History  Problem Relation Age of Onset   Diabetes Mother    Hypertension Mother    Hyperlipidemia Mother    Arthritis Mother    Esophageal cancer Mother        patient unsure   Diabetes Father    Heart disease Father        Age 36   Kidney disease Father    Hypertension Sister    Diabetes Daughter    Colon cancer Neg Hx    Stomach cancer Neg Hx    Pancreatic cancer Neg Hx    Liver disease Neg Hx     Outpatient Encounter Medications as of 09/22/2020  Medication Sig   Accu-Chek Softclix Lancets lancets    fluticasone (FLONASE) 50 MCG/ACT nasal spray USE 1 SPRAY IN  EACH NOSTRIL TWICE DAILY AS NEEDED FOR ALLERGIES OR RHINITIS (SUBSTITUTED FOR FLONASE)   FLUZONE QUADRIVALENT 0.5 ML injection    glipiZIDE (GLUCOTROL XL) 5 MG 24 hr tablet Take 1 tablet (5 mg total) by mouth daily with breakfast.   glucose blood test strip Test BS TID Dx E11.9   ketoconazole (NIZORAL) 2 % cream APPLY 1 APPLICATION TOPICALLY DAILY.   lisinopril (ZESTRIL) 20 MG tablet TAKE 1 TABLET EVERY DAY   metFORMIN (GLUCOPHAGE) 1000 MG tablet TAKE 1 TABLET TWICE DAILY WITH A MEAL   metoCLOPramide (REGLAN) 5 MG tablet Take 1 tablet (5 mg total) by mouth 3 (three) times daily before meals.   Omega-3 Fatty Acids (FISH OIL) 1200 MG CPDR Take 1,200 mg by mouth daily.   omeprazole (PRILOSEC) 40 MG capsule Take 1 capsule (40 mg total) by mouth daily.   Oxycodone HCl 10 MG TABS Take 1 tablet (10 mg total) by mouth 4 (four) times daily as needed.   [START ON 11/02/2020] Oxycodone HCl 10 MG TABS Take 1 tablet (10 mg total) by mouth 4 (four) times daily as needed.   [START ON 10/03/2020] Oxycodone HCl 10 MG TABS Take 1 tablet (10 mg total) by mouth 4 (four) times daily as needed.   simvastatin (ZOCOR)  40 MG tablet TAKE 1 TABLET EVERY DAY AT 6 PM   triamcinolone cream (KENALOG) 0.1 % Apply 1 application topically 2 (two) times daily.   TRULICITY 1.5 JG/2.8ZM SOPN INJECT 1.5 MG INTO THE SKIN ONCE A WEEK.   CINNAMON PO Take 1 capsule by mouth 2 (two) times daily.   No facility-administered encounter medications on file as of 09/22/2020.    ALLERGIES: Allergies  Allergen Reactions   Fenofibrate Rash    Permament rash.     VACCINATION STATUS: Immunization History  Administered Date(s) Administered   Influenza,inj,Quad PF,6+ Mos 11/15/2018   Influenza-Unspecified 09/04/2016   Moderna Sars-Covid-2 Vaccination 03/13/2019, 04/10/2019, 11/06/2019, 06/19/2020   Pneumococcal Polysaccharide-23 09/16/2016   Zoster Recombinat (Shingrix) 09/03/2020    Diabetes He presents for his follow-up diabetic visit. He has type 2 diabetes mellitus. Onset time: He was diagnosed at approximate age of 37 years. His disease course has been stable. There are no hypoglycemic associated symptoms. Pertinent negatives for hypoglycemia include no confusion, headaches, pallor or seizures. Pertinent negatives for diabetes include no chest pain, no fatigue, no polydipsia, no polyphagia, no polyuria and no weakness. There are no hypoglycemic complications. Symptoms are stable. Diabetic complications include nephropathy (mild). Risk factors for coronary artery disease include diabetes mellitus, dyslipidemia, family history, obesity, male sex, hypertension, sedentary lifestyle and tobacco exposure. Current diabetic treatment includes oral agent (dual therapy). He is compliant with treatment most of the time. His weight is fluctuating minimally. He is following a generally unhealthy diet. When asked about meal planning, he reported none. He has not had a previous visit with a dietitian. He rarely participates in exercise. His home blood glucose trend is fluctuating minimally. (He presents today with his logs, no meter, showing  stable glycemic profile.  His previsit A1c was 6.5%, essentially unchanged from previous visit.  He did have rare occasions of mild hypoglycemia (had symptoms when his glucose reached 80s).  ) An ACE inhibitor/angiotensin II receptor blocker is being taken. He does not see a podiatrist.Eye exam is current.  Hyperlipidemia This is a chronic problem. The current episode started more than 1 year ago. The problem is uncontrolled. Recent lipid tests were reviewed and are variable. Exacerbating diseases include chronic renal  disease, diabetes and obesity. Factors aggravating his hyperlipidemia include fatty foods. Pertinent negatives include no chest pain, myalgias or shortness of breath. Current antihyperlipidemic treatment includes statins. The current treatment provides moderate improvement of lipids. Compliance problems include adherence to diet and adherence to exercise.  Risk factors for coronary artery disease include dyslipidemia, diabetes mellitus, family history, hypertension, male sex, obesity and a sedentary lifestyle.  Hypertension This is a chronic problem. The current episode started more than 1 year ago. The problem has been gradually improving since onset. The problem is controlled. Pertinent negatives include no chest pain, headaches, neck pain, palpitations or shortness of breath. There are no associated agents to hypertension. Risk factors for coronary artery disease include diabetes mellitus, dyslipidemia, male gender, obesity, sedentary lifestyle, family history and smoking/tobacco exposure. Past treatments include ACE inhibitors. The current treatment provides moderate improvement. There are no compliance problems.  Hypertensive end-organ damage includes kidney disease. Identifiable causes of hypertension include chronic renal disease.   Review of systems  Constitutional: + Minimally fluctuating body weight,  current Body mass index is 34.49 kg/m. , no fatigue, no subjective hyperthermia,  no subjective hypothermia Eyes: no blurry vision, no xerophthalmia ENT: no sore throat, no nodules palpated in throat, no dysphagia/odynophagia, no hoarseness Cardiovascular: no chest pain, no shortness of breath, no palpitations, no leg swelling Respiratory: no cough, no shortness of breath Gastrointestinal: no nausea/vomiting/diarrhea (hx of gastroparesis) Musculoskeletal: no muscle/joint aches Skin: no rashes, no hyperemia Neurological: no tremors, no numbness, no tingling, no dizziness Psychiatric: no depression, no anxiety   Objective:    BP (!) 151/90   Pulse 79   Ht 6\' 1"  (1.854 m)   Wt 261 lb 6.4 oz (118.6 kg)   BMI 34.49 kg/m   Wt Readings from Last 3 Encounters:  09/22/20 261 lb 6.4 oz (118.6 kg)  09/03/20 259 lb (117.5 kg)  07/17/20 257 lb (116.6 kg)    BP Readings from Last 3 Encounters:  09/22/20 (!) 151/90  09/03/20 140/82  07/17/20 124/74     Physical Exam- Limited  Constitutional:  Body mass index is 34.49 kg/m. , not in acute distress, normal state of mind Eyes:  EOMI, no exophthalmos Neck: Supple Cardiovascular: RRR, no murmurs, rubs, or gallops, no edema Respiratory: Adequate breathing efforts, no crackles, rales, rhonchi, or wheezing Musculoskeletal: no gross deformities, strength intact in all four extremities, no gross restriction of joint movements Skin:  no rashes, no hyperemia Neurological: no tremor with outstretched hands    POCT ABI Results 09/22/20   Right ABI:  1.17      Left ABI:  1.20  Right leg systolic / diastolic: 814/481 mmHg Left leg systolic / diastolic: 856/314 mmHg  Arm systolic / diastolic: 970/26 mmHG  Detailed report will be scanned into patient chart.   CMP ( most recent) CMP     Component Value Date/Time   NA 138 09/03/2020 0959   K 4.6 09/03/2020 0959   CL 101 09/03/2020 0959   CO2 22 09/03/2020 0959   GLUCOSE 141 (H) 09/03/2020 0959   GLUCOSE 208 (H) 04/03/2015 1500   BUN 16 09/03/2020 0959    CREATININE 0.99 09/03/2020 0959   CALCIUM 9.5 09/03/2020 0959   PROT 6.9 09/03/2020 0959   ALBUMIN 4.5 09/03/2020 0959   AST 34 09/03/2020 0959   ALT 78 (H) 09/03/2020 0959   ALKPHOS 87 09/03/2020 0959   BILITOT 0.3 09/03/2020 0959   GFRNONAA 66 01/18/2020 0842   GFRAA 76 01/18/2020 0842   Diabetic  Labs (most recent): Lab Results  Component Value Date   HGBA1C 6.5 09/03/2020   HGBA1C 6.6 (A) 05/22/2020   HGBA1C 7.4 (H) 01/18/2020     Lipid Panel ( most recent) Lipid Panel     Component Value Date/Time   CHOL 136 09/03/2020 0959   TRIG 194 (H) 09/03/2020 0959   HDL 36 (L) 09/03/2020 0959   CHOLHDL 3.8 09/03/2020 0959   LDLCALC 68 09/03/2020 0959   LABVLDL 32 09/03/2020 0959     Lab Results  Component Value Date   TSH 4.170 09/03/2020   TSH 2.010 05/08/2019   TSH 2.13 02/07/2018   TSH 3.510 09/16/2016   TSH 1.270 09/11/2014   FREET4 1.38 09/03/2020   FREET4 1.48 05/08/2019     Assessment & Plan:   1) Uncontrolled type 2 diabetes mellitus with hyperglycemia ([place])  - Geronimo Diliberto has currently uncontrolled symptomatic type 2 DM since 55 years of age.  He presents today with his logs, no meter, showing stable glycemic profile.  His previsit A1c was 6.5%, essentially unchanged from previous visit.  He did have rare occasions of mild hypoglycemia (had symptoms when his glucose reached 80s).    - I had a long discussion with him about the progressive nature of diabetes and the pathology behind its complications. -his diabetes is complicated by obesity/sedentary life, history of smoking and he remains at a high risk for more acute and chronic complications which include CAD, CVA, CKD, retinopathy, and neuropathy. These are all discussed in detail with him.  - Nutritional counseling repeated at each appointment due to patients tendency to fall back in to old habits.  - The patient admits there is a room for improvement in their diet and drink choices. -  Suggestion is  made for the patient to avoid simple carbohydrates from their diet including Cakes, Sweet Desserts / Pastries, Ice Cream, Soda (diet and regular), Sweet Tea, Candies, Chips, Cookies, Sweet Pastries, Store Bought Juices, Alcohol in Excess of 1-2 drinks a day, Artificial Sweeteners, Coffee Creamer, and "Sugar-free" Products. This will help patient to have stable blood glucose profile and potentially avoid unintended weight gain.   - I encouraged the patient to switch to unprocessed or minimally processed complex starch and increased protein intake (animal or plant source), fruits, and vegetables.   - Patient is advised to stick to a routine mealtimes to eat 3 meals a day and avoid unnecessary snacks (to snack only to correct hypoglycemia).  - he has seen Jearld Fenton, RDN, CDE for diabetes education.  - I have approached him with the following individualized plan to manage his diabetes and patient agrees:   -Given his improved, stable glycemic profile, no changes will be made to his medication regimen today.  He is advised to continue Metformin 1000 mg po twice daily with meals, Glipizide 5 mg XL daily with breakfast, and Trulicity 1.5 mg SQ weekly.    He is encouraged to continue monitoring blood glucose at least once daily, before breakfast and notify the clinic if glucose levels are less than 70 or greater than 200 for 3 tests in a row.  - Specific targets for  A1c;  LDL, HDL, Triglycerides, were discussed with the patient.  2) Blood Pressure /Hypertension:  His blood pressure is not controlled to target.  He had not yet taken his BP medications today.  He is advised to continue Lisinopril 20 mg po daily.    3) Lipids/Hyperlipidemia:  His recent lipid panel on 09/03/20 shows  controlled LDL at 68 and triglycerides at 194.  He is advised to continue Simvastatin 40 mg po daily at bedtime and Fish Oil 1200 mg po daily. Side effects and precautions discussed with him. He is advised to avoid fried  foods and butter.    4)  Weight/Diet: His Body mass index is 34.49 kg/m.--  clearly complicating his diabetes care.  He is a candidate for modest weight loss.  I discussed with him the fact that loss of 5 - 10% of his  current body weight will have the most impact on his diabetes management.  Exercise, and detailed carbohydrates information provided  -  detailed on discharge instructions.  5) Chronic Care/Health Maintenance: -he is on ACEI/ARB and Statin medications and is encouraged to initiate and continue to follow up with Ophthalmology, Dentist,  Podiatrist at least yearly or according to recommendations, and advised to stay away from smoking.  He is safe he does not believe in flu shots,  I have recommended yearly flu vaccine and pneumonia vaccine at least every 5 years; moderate intensity exercise for up to 150 minutes weekly; and  sleep for at least 7 hours a day.  - he is advised to maintain close follow up with Dettinger, Fransisca Kaufmann, MD for primary care needs, as well as his other providers for optimal and coordinated care.       I spent 30 minutes in the care of the patient today including review of labs from [place], Lipids, Thyroid Function, Hematology (current and previous including abstractions from other facilities); face-to-face time discussing  his blood glucose readings/logs, discussing hypoglycemia and hyperglycemia episodes and symptoms, medications doses, his options of short and long term treatment based on the latest standards of care / guidelines;  discussion about incorporating lifestyle medicine;  and documenting the encounter.    Please refer to Patient Instructions for Blood Glucose Monitoring and Insulin/Medications Dosing Guide"  in media tab for additional information. Please  also refer to " Patient Self Inventory" in the Media  tab for reviewed elements of pertinent patient history.  [name] participated in the discussions, expressed understanding, and voiced  agreement with the above plans.  All questions were answered to his satisfaction. he is encouraged to contact clinic should he have any questions or concerns prior to his return visit.    Follow up plan: - Return in about 6 months (around 03/22/2021) for Diabetes F/U with A1c in office, No previsit labs, Bring meter and logs.  Rayetta Pigg, [facility] [address] [place], [place] [address] Phone: [phone] Fax: [phone]  09/22/2020, 9:04 AM

## 2020-09-24 ENCOUNTER — Other Ambulatory Visit: Payer: Self-pay | Admitting: *Deleted

## 2020-09-24 MED ORDER — ACCU-CHEK SOFTCLIX LANCETS MISC: 3 refills | Status: DC

## 2020-09-24 MED ORDER — ACCU-CHEK GUIDE W/DEVICE KIT: PACK | 0 refills | Status: DC

## 2020-09-30 ENCOUNTER — Other Ambulatory Visit: Payer: Self-pay | Admitting: *Deleted

## 2020-09-30 MED ORDER — LISINOPRIL 20 MG PO TABS: 20.0000 mg | ORAL_TABLET | Freq: Every day | ORAL | 0 refills | Status: DC

## 2020-11-07 ENCOUNTER — Other Ambulatory Visit: Payer: Self-pay | Admitting: Nurse Practitioner

## 2020-12-03 ENCOUNTER — Encounter: Payer: Self-pay | Admitting: Family Medicine

## 2020-12-03 ENCOUNTER — Ambulatory Visit (INDEPENDENT_AMBULATORY_CARE_PROVIDER_SITE_OTHER): Payer: Medicare HMO | Admitting: Family Medicine

## 2020-12-03 VITALS — BP 142/87 | HR 75 | Ht 73.0 in | Wt 262.0 lb

## 2020-12-03 DIAGNOSIS — E1165 Type 2 diabetes mellitus with hyperglycemia: Secondary | ICD-10-CM | POA: Diagnosis not present

## 2020-12-03 DIAGNOSIS — M79671 Pain in right foot: Secondary | ICD-10-CM

## 2020-12-03 DIAGNOSIS — G8929 Other chronic pain: Secondary | ICD-10-CM | POA: Diagnosis not present

## 2020-12-03 DIAGNOSIS — E1169 Type 2 diabetes mellitus with other specified complication: Secondary | ICD-10-CM

## 2020-12-03 DIAGNOSIS — E1159 Type 2 diabetes mellitus with other circulatory complications: Secondary | ICD-10-CM | POA: Diagnosis not present

## 2020-12-03 DIAGNOSIS — E782 Mixed hyperlipidemia: Secondary | ICD-10-CM | POA: Diagnosis not present

## 2020-12-03 DIAGNOSIS — I152 Hypertension secondary to endocrine disorders: Secondary | ICD-10-CM | POA: Diagnosis not present

## 2020-12-03 DIAGNOSIS — G629 Polyneuropathy, unspecified: Secondary | ICD-10-CM | POA: Diagnosis not present

## 2020-12-03 LAB — BAYER DCA HB A1C WAIVED: HB A1C (BAYER DCA - WAIVED): 7.1 % — ABNORMAL HIGH (ref 4.8–5.6)

## 2020-12-03 MED ORDER — TRULICITY 1.5 MG/0.5ML ~~LOC~~ SOAJ: 1.5000 mg | SUBCUTANEOUS | 2 refills | Status: DC

## 2020-12-03 MED ORDER — SIMVASTATIN 40 MG PO TABS: ORAL_TABLET | ORAL | 3 refills | Status: DC

## 2020-12-03 MED ORDER — OXYCODONE HCL 10 MG PO TABS: 10.0000 mg | ORAL_TABLET | Freq: Four times a day (QID) | ORAL | 0 refills | Status: DC | PRN

## 2020-12-03 MED ORDER — METFORMIN HCL 1000 MG PO TABS: ORAL_TABLET | ORAL | 3 refills | Status: DC

## 2020-12-03 MED ORDER — METOCLOPRAMIDE HCL 5 MG PO TABS: 5.0000 mg | ORAL_TABLET | Freq: Three times a day (TID) | ORAL | 3 refills | Status: DC

## 2020-12-03 MED ORDER — KETOCONAZOLE 2 % EX CREA
1.0000 | TOPICAL_CREAM | Freq: Every day | CUTANEOUS | 2 refills | Status: AC
Start: 2020-12-03 — End: ?

## 2020-12-03 MED ORDER — GLIPIZIDE ER 5 MG PO TB24: 5.0000 mg | ORAL_TABLET | Freq: Every day | ORAL | 3 refills | Status: DC

## 2020-12-03 MED ORDER — FLUTICASONE PROPIONATE 50 MCG/ACT NA SUSP: NASAL | 3 refills | Status: DC

## 2020-12-03 MED ORDER — LISINOPRIL 20 MG PO TABS: 20.0000 mg | ORAL_TABLET | Freq: Every day | ORAL | 0 refills | Status: DC

## 2020-12-03 NOTE — Progress Notes (Signed)
BP (!) 142/87   Pulse 75   Ht $R'6\' 1"'Fu$  (1.854 m)   Wt 262 lb (118.8 kg)   SpO2 98%   BMI 34.57 kg/m    Subjective:   Patient ID: Timothy Nelson, male    DOB: 08/20/1965, 55 y.o.   MRN: [id number]  HPI: Timothy Nelson is a 55 y.o. male presenting on 12/03/2020 for Medical Management of Chronic Issues and Diabetes   HPI Type 2 diabetes mellitus Patient comes in today for recheck of his diabetes. Patient has been currently taking metformin and Trulicity and glipizide. Patient is currently on an ACE inhibitor/ARB. Patient has seen an ophthalmologist this year. Patient denies any new issues with their feet. The symptom started onset as an adult hypertension and hyperlipidemia and neuropathy ARE RELATED TO DM I  Hypertension Patient is currently on lisinopril, and their blood pressure today is 142/87. Patient denies any lightheadedness or dizziness. Patient denies headaches, blurred vision, chest pains, shortness of breath, or weakness. Denies any side effects from medication and is content with current medication.   Hyperlipidemia Patient is coming in for recheck of his hyperlipidemia. The patient is currently taking fish oils and simvastatin. They deny any issues with myalgias or history of liver damage from it. They deny any focal numbness or weakness or chest pain.   Pain assessment: Cause of pain-bilateral ankle pain due to trauma and fractures in his ankles Pain location-bilateral ankles Pain on scale of 1-10- 6 Frequency-Daily What increases pain-weather changes and cold makes it worse What makes pain Better-warmth and rest and oxycodone Effects on ADL -limits some ability to walk but he gets around pretty good and has figured away to keep going. Any change in general medical condition-none  Current opioids rx-oxycodone 10 mg 4 times daily as needed # meds rx-120 Effectiveness of current meds-works well Adverse reactions from pain meds-none Morphine equivalent-60  Pill count  performed-No Last drug screen -03/07/2020 ( high risk q29m, moderate risk q64m, low risk yearly ) Urine drug screen today- No Was the [place] reviewed-yes but system is down  If yes were their any concerning findings? -   No flowsheet data found.   Pain contract signed on: 03/07/2020  Relevant past medical, surgical, family and social history reviewed and updated as indicated. Interim medical history since our last visit reviewed. Allergies and medications reviewed and updated.  Review of Systems  Constitutional:  Negative for chills and fever.  Respiratory:  Negative for shortness of breath and wheezing.   Cardiovascular:  Negative for chest pain and leg swelling.  Musculoskeletal:  Positive for arthralgias and gait problem. Negative for back pain.  Skin:  Negative for color change and rash.  All other systems reviewed and are negative.  Per HPI unless specifically indicated above   Allergies as of 12/03/2020       Reactions   Fenofibrate Rash   Permament rash.        Medication List        Accurate as of December 03, 2020 11:28 AM. If you have any questions, ask your nurse or doctor.          Accu-Chek Guide w/Device Kit Test BS TID Dx E11.9   Accu-Chek Softclix Lancets lancets Test BS TID Dx E11.9   Fish Oil 1200 MG Cpdr Take 1,200 mg by mouth daily.   fluticasone 50 MCG/ACT nasal spray Commonly known as: FLONASE USE 1 SPRAY IN EACH NOSTRIL TWICE DAILY AS NEEDED FOR ALLERGIES OR RHINITIS (SUBSTITUTED FOR  FLONASE)   Fluzone Quadrivalent 0.5 ML injection Generic drug: influenza vac split quadrivalent PF   glipiZIDE 5 MG 24 hr tablet Commonly known as: GLUCOTROL XL Take 1 tablet (5 mg total) by mouth daily with breakfast.   glucose blood test strip Test BS TID Dx E11.9   ketoconazole 2 % cream Commonly known as: NIZORAL Apply 1 application topically daily.   lisinopril 20 MG tablet Commonly known as: ZESTRIL Take 1 tablet (20 mg total) by mouth  daily.   metFORMIN 1000 MG tablet Commonly known as: GLUCOPHAGE TAKE 1 TABLET TWICE DAILY WITH A MEAL   metoCLOPramide 5 MG tablet Commonly known as: REGLAN Take 1 tablet (5 mg total) by mouth 3 (three) times daily before meals.   omeprazole 40 MG capsule Commonly known as: PRILOSEC Take 1 capsule (40 mg total) by mouth daily.   Oxycodone HCl 10 MG Tabs Take 1 tablet (10 mg total) by mouth 4 (four) times daily as needed. What changed: Another medication with the same name was changed. Make sure you understand how and when to take each. Changed by: Fransisca Kaufmann Karon Cotterill, MD   Oxycodone HCl 10 MG Tabs Take 1 tablet (10 mg total) by mouth 4 (four) times daily as needed. Start taking on: January 01, 2021 What changed: These instructions start on January 01, 2021. If you are unsure what to do until then, ask your doctor or other care provider. Changed by: Fransisca Kaufmann Dragon Thrush, MD   Oxycodone HCl 10 MG Tabs Take 1 tablet (10 mg total) by mouth 4 (four) times daily as needed. Start taking on: February 01, 2021 What changed: These instructions start on February 01, 2021. If you are unsure what to do until then, ask your doctor or other care provider. Changed by: Fransisca Kaufmann Roosevelt Eimers, MD   simvastatin 40 MG tablet Commonly known as: ZOCOR TAKE 1 TABLET EVERY DAY AT 6 PM   triamcinolone cream 0.1 % Commonly known as: KENALOG Apply 1 application topically 2 (two) times daily.   Trulicity 1.5 GD/9.2EQ Sopn Generic drug: Dulaglutide Inject 1.5 mg into the skin once a week.         Objective:   BP (!) 142/87   Pulse 75   Ht $R'6\' 1"'Ys$  (1.854 m)   Wt 262 lb (118.8 kg)   SpO2 98%   BMI 34.57 kg/m   Wt Readings from Last 3 Encounters:  12/03/20 262 lb (118.8 kg)  09/22/20 261 lb 6.4 oz (118.6 kg)  09/03/20 259 lb (117.5 kg)    Physical Exam Vitals and nursing note reviewed.  Constitutional:      General: He is not in acute distress.    Appearance: He is well-developed. He is not  diaphoretic.  Eyes:     General: No scleral icterus.    Conjunctiva/sclera: Conjunctivae normal.  Neck:     Thyroid: No thyromegaly.  Cardiovascular:     Rate and Rhythm: Normal rate and regular rhythm.     Heart sounds: Normal heart sounds. No murmur heard. Pulmonary:     Effort: Pulmonary effort is normal. No respiratory distress.     Breath sounds: Normal breath sounds. No wheezing.  Musculoskeletal:        General: Swelling (Trace) present. No tenderness. Normal range of motion.     Cervical back: Neck supple.  Lymphadenopathy:     Cervical: No cervical adenopathy.  Skin:    General: Skin is warm and dry.     Findings: No rash.  Neurological:  Mental Status: He is alert and oriented to person, place, and time.     Coordination: Coordination normal.  Psychiatric:        Behavior: Behavior normal.      Assessment & Plan:   Problem List Items Addressed This Visit       Cardiovascular and Mediastinum   Hypertension associated with diabetes ([place])   Relevant Medications   glipiZIDE (GLUCOTROL XL) 5 MG 24 hr tablet   lisinopril (ZESTRIL) 20 MG tablet   metFORMIN (GLUCOPHAGE) 1000 MG tablet   simvastatin (ZOCOR) 40 MG tablet   Dulaglutide (TRULICITY) 1.5 TG/2.5WL SOPN     Endocrine   Diabetes mellitus, type 2 (HCC) - Primary   Relevant Medications   glipiZIDE (GLUCOTROL XL) 5 MG 24 hr tablet   lisinopril (ZESTRIL) 20 MG tablet   metFORMIN (GLUCOPHAGE) 1000 MG tablet   simvastatin (ZOCOR) 40 MG tablet   Dulaglutide (TRULICITY) 1.5 SL/3.7DS SOPN   Other Relevant Orders   Bayer DCA Hb A1c Waived     Nervous and Auditory   Neuropathy     Other   Chronic foot pain   Relevant Medications   Oxycodone HCl 10 MG TABS   Oxycodone HCl 10 MG TABS (Start on 02/01/2021)   Oxycodone HCl 10 MG TABS (Start on 01/01/2021)   Mixed hyperlipidemia   Relevant Medications   lisinopril (ZESTRIL) 20 MG tablet   simvastatin (ZOCOR) 40 MG tablet   Other Visit Diagnoses      Uncontrolled type 2 diabetes mellitus with hyperglycemia (HCC)       Relevant Medications   glipiZIDE (GLUCOTROL XL) 5 MG 24 hr tablet   lisinopril (ZESTRIL) 20 MG tablet   metFORMIN (GLUCOPHAGE) 1000 MG tablet   simvastatin (ZOCOR) 40 MG tablet   Dulaglutide (TRULICITY) 1.5 KA/7.6OT SOPN       A1c slightly up from previous at 7.1, he does admit with holiday eating and increased sweet tea and soda that that is the reason is gone up.  He will refocus on diet and watch his portions better.  No change in medication  Refilled medication for his chronic pain in his ankles Follow up plan: Return in about 3 months (around 03/03/2021), or if symptoms worsen or fail to improve, for Diabetes and hypertension.  Counseling provided for all of the vaccine components Orders Placed This Encounter  Procedures   Bayer Hardy Hb A1c [place] Skyy Mcknight, MD [facility] 12/03/2020, 11:28 AM

## 2020-12-12 ENCOUNTER — Other Ambulatory Visit: Payer: Self-pay | Admitting: Family Medicine

## 2020-12-12 DIAGNOSIS — E1169 Type 2 diabetes mellitus with other specified complication: Secondary | ICD-10-CM

## 2020-12-24 ENCOUNTER — Encounter: Payer: Self-pay | Admitting: Family Medicine

## 2020-12-24 ENCOUNTER — Telehealth: Payer: Medicare HMO | Admitting: Family

## 2020-12-24 DIAGNOSIS — U071 COVID-19: Secondary | ICD-10-CM

## 2020-12-24 MED ORDER — MOLNUPIRAVIR EUA 200MG CAPSULE
4.0000 | ORAL_CAPSULE | Freq: Two times a day (BID) | ORAL | 0 refills | Status: AC
Start: 2020-12-24 — End: 2020-12-29

## 2020-12-24 NOTE — Progress Notes (Signed)
Virtual Visit Consent   Timothy Nelson, you are scheduled for a virtual visit with a [facility] provider today.     Just as with appointments in the office, your consent must be obtained to participate.  Your consent will be active for this visit and any virtual visit you may have with one of our providers in the next 365 days.     If you have a MyChart account, a copy of this consent can be sent to you electronically.  All virtual visits are billed to your insurance company just like a traditional visit in the office.    As this is a virtual visit, video technology does not allow for your provider to perform a traditional examination.  This may limit your provider's ability to fully assess your condition.  If your provider identifies any concerns that need to be evaluated in person or the need to arrange testing (such as labs, EKG, etc.), we will make arrangements to do so.     Although advances in technology are sophisticated, we cannot ensure that it will always work on either your end or our end.  If the connection with a video visit is poor, the visit may have to be switched to a telephone visit.  With either a video or telephone visit, we are not always able to ensure that we have a secure connection.     I need to obtain your verbal consent now.   Are you willing to proceed with your visit today?    Dashton Czerwinski has provided verbal consent on 12/24/2020 for a virtual visit (video or telephone).   Evelina Dun, FNP   Date: 12/24/2020 3:12 PM   Virtual Visit via Video Note   I, Evelina Dun, connected with  Farid Grigorian  ([id number], March 25, 1965) on 12/24/20 at  3:00 PM EST by a video-enabled telemedicine application and verified that I am speaking with the correct person using two identifiers.  Location: Patient: Virtual Visit Location Patient: Home Provider: Virtual Visit Location Provider: Home Office   I discussed the limitations of evaluation and management by telemedicine and  the availability of in person appointments. The patient expressed understanding and agreed to proceed.    History of Present Illness: Timothy Nelson is a 55 y.o. who identifies as a male who was assigned male at birth, and is being seen today for COVID. He reports his symptoms started yesterday and and tested positive today.   HPI: Cough This is a new problem. The current episode started yesterday. The problem has been gradually worsening. The problem occurs every few minutes. The cough is Non-productive. Associated symptoms include chills, headaches, myalgias, nasal congestion, postnasal drip and rhinorrhea. Pertinent negatives include no ear congestion, ear pain, fever, shortness of breath or wheezing. Risk factors for lung disease include smoking/tobacco exposure. He has tried rest for the symptoms.   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    Medications:  Current Outpatient Medications:    molnupiravir EUA (LAGEVRIO) 200 mg CAPS capsule, Take 4 capsules (800 mg total) by mouth 2 (two) times daily for 5 days., Disp: 40 capsule, Rfl: 0   Accu-Chek Softclix Lancets lancets, Test BS TID Dx E11.9, Disp: 300 each, Rfl: 3    Blood Glucose Monitoring Suppl (ACCU-CHEK GUIDE) w/Device KIT, Test BS TID Dx E11.9, Disp: 1 kit, Rfl: 0   Dulaglutide (TRULICITY) 1.5 DJ/5.7SV SOPN, Inject 1.5 mg into the skin once a week., Disp: 2 mL, Rfl: 2   fluticasone (FLONASE) 50 MCG/ACT nasal spray, USE 1 SPRAY IN EACH NOSTRIL TWICE DAILY AS NEEDED FOR ALLERGIES OR RHINITIS (SUBSTITUTED FOR FLONASE), Disp: 48 g, Rfl: 3   FLUZONE QUADRIVALENT 0.5 ML injection, , Disp: , Rfl:    glipiZIDE (GLUCOTROL XL) 5 MG 24 hr tablet, Take 1 tablet (5 mg total) by mouth daily with breakfast., Disp: 90 tablet, Rfl: 3   glucose blood test strip, Test BS TID Dx E11.9, Disp: 300 each, Rfl: 3   ketoconazole (NIZORAL) 2 % cream, Apply 1 application topically daily., Disp: 60 g, Rfl: 2   lisinopril (ZESTRIL) 20 MG tablet, Take 1 tablet (20 mg total) by mouth daily., Disp: 90 tablet, Rfl: 0   metFORMIN (GLUCOPHAGE) 1000 MG tablet, TAKE 1 TABLET TWICE DAILY WITH A MEAL, Disp: 180 tablet, Rfl: 3   metoCLOPramide (REGLAN) 5 MG tablet, Take 1 tablet (5 mg total) by mouth 3 (three) times daily before meals., Disp: 270 tablet, Rfl: 3   Omega-3 Fatty Acids (FISH OIL) 1200 MG CPDR, Take 1,200 mg by mouth daily., Disp: 90 capsule, Rfl: 3   omeprazole (PRILOSEC) 40 MG capsule, Take 1 capsule (40 mg total) by mouth daily., Disp: 90 capsule, Rfl: 3   Oxycodone HCl 10 MG TABS, Take 1 tablet (10 mg total) by mouth 4 (four) times daily as needed., Disp: 120 tablet, Rfl: 0   [START ON 02/01/2021] Oxycodone HCl 10 MG TABS, Take 1 tablet (10 mg total) by mouth 4 (four) times daily as needed., Disp: 120 tablet, Rfl: 0   [START ON 01/01/2021] Oxycodone HCl 10 MG TABS, Take 1 tablet (10 mg total) by mouth 4 (four) times daily as needed., Disp: 120 tablet, Rfl: 0   simvastatin (ZOCOR) 40 MG tablet, TAKE 1 TABLET EVERY DAY AT 6 PM, Disp: 90 tablet, Rfl: 3   triamcinolone cream (KENALOG) 0.1 %, Apply 1 application topically 2 (two) times daily., Disp: 454 g, Rfl:  1  Observations/Objective: Patient is well-developed, well-nourished in no acute distress.  Resting comfortably  at home.  Head is normocephalic, atraumatic.  No labored breathing.  Speech is clear and coherent with logical content.  Patient is alert and oriented at baseline.  Nasal congestion  Assessment and Plan: 1. COVID-19 - molnupiravir EUA (LAGEVRIO) 200 mg CAPS capsule; Take 4 capsules (800 mg total) by mouth 2 (two) times daily for 5 days.  Dispense: 40 capsule; Refill: 0  COVID positive, rest, force fluids, tylenol as needed, Quarantine for at least 5 days and you are fever free, then must wear a mask out in public from day 7-79, report any worsening symptoms such as increased shortness of breath, swelling, or continued high fevers. Possible adverse effects discussed with antivirals.    Follow Up Instructions: I discussed  the assessment and treatment plan with the patient. The patient was provided an opportunity to ask questions and all were answered. The patient agreed with the plan and demonstrated an understanding of the instructions.  A copy of instructions were sent to the patient via MyChart unless otherwise noted below.     The patient was advised to call back or seek an in-person evaluation if the symptoms worsen or if the condition fails to improve as anticipated.  Time:  I spent 9 minutes with the patient via telehealth technology discussing the above problems/concerns.    Evelina Dun, FNP

## 2020-12-25 ENCOUNTER — Telehealth: Payer: Medicare HMO | Admitting: Family Medicine

## 2021-01-07 DIAGNOSIS — E1142 Type 2 diabetes mellitus with diabetic polyneuropathy: Secondary | ICD-10-CM | POA: Diagnosis not present

## 2021-01-07 DIAGNOSIS — L602 Onychogryphosis: Secondary | ICD-10-CM | POA: Diagnosis not present

## 2021-02-15 ENCOUNTER — Other Ambulatory Visit: Payer: Self-pay | Admitting: Family Medicine

## 2021-02-15 DIAGNOSIS — E1169 Type 2 diabetes mellitus with other specified complication: Secondary | ICD-10-CM

## 2021-03-04 ENCOUNTER — Encounter: Payer: Self-pay | Admitting: Family Medicine

## 2021-03-04 ENCOUNTER — Ambulatory Visit (INDEPENDENT_AMBULATORY_CARE_PROVIDER_SITE_OTHER): Payer: Medicare HMO | Admitting: Family Medicine

## 2021-03-04 VITALS — BP 112/75 | HR 91 | Ht 73.0 in | Wt 257.1 lb

## 2021-03-04 DIAGNOSIS — Z23 Encounter for immunization: Secondary | ICD-10-CM | POA: Diagnosis not present

## 2021-03-04 DIAGNOSIS — E785 Hyperlipidemia, unspecified: Secondary | ICD-10-CM

## 2021-03-04 DIAGNOSIS — Z79899 Other long term (current) drug therapy: Secondary | ICD-10-CM | POA: Diagnosis not present

## 2021-03-04 DIAGNOSIS — M79671 Pain in right foot: Secondary | ICD-10-CM

## 2021-03-04 DIAGNOSIS — I152 Hypertension secondary to endocrine disorders: Secondary | ICD-10-CM

## 2021-03-04 DIAGNOSIS — G8929 Other chronic pain: Secondary | ICD-10-CM

## 2021-03-04 DIAGNOSIS — E1159 Type 2 diabetes mellitus with other circulatory complications: Secondary | ICD-10-CM

## 2021-03-04 DIAGNOSIS — R739 Hyperglycemia, unspecified: Secondary | ICD-10-CM | POA: Diagnosis not present

## 2021-03-04 DIAGNOSIS — E1169 Type 2 diabetes mellitus with other specified complication: Secondary | ICD-10-CM | POA: Diagnosis not present

## 2021-03-04 LAB — BAYER DCA HB A1C WAIVED: HB A1C (BAYER DCA - WAIVED): 6.6 % — ABNORMAL HIGH (ref 4.8–5.6)

## 2021-03-04 MED ORDER — LISINOPRIL 20 MG PO TABS: 20.0000 mg | ORAL_TABLET | Freq: Every day | ORAL | 3 refills | Status: DC

## 2021-03-04 MED ORDER — OXYCODONE HCL 10 MG PO TABS: 10.0000 mg | ORAL_TABLET | Freq: Four times a day (QID) | ORAL | 0 refills | Status: DC | PRN

## 2021-03-04 MED ORDER — TRULICITY 1.5 MG/0.5ML ~~LOC~~ SOAJ: 1.5000 mg | SUBCUTANEOUS | 2 refills | Status: DC

## 2021-03-04 NOTE — Progress Notes (Signed)
BP 112/75    Pulse 91    Ht _0  (1.854 m)    Wt 257 lb 2 oz (116.6 kg)    SpO2 96%    BMI 33.92 kg/m    Subjective:   Patient ID: Timothy Nelson, male    DOB: [birth year], 56 y.o.   MRN: [id number]  HPI: Timothy Nelson is a 56 y.o. male presenting on 03/04/2021 for Medical Management of Chronic Issues and Diabetes   HPI Type 2 diabetes mellitus Patient comes in today for recheck of his diabetes. Patient has been currently taking Trulicity and metformin and glipizide. Patient is currently on an ACE inhibitor/ARB. Patient has not seen an ophthalmologist this year. Patient denies any issues with their feet. The symptom started onset as an adult hypertension and neuropathy ARE RELATED TO DM   Hypertension Patient is currently on lisinopril, and their blood pressure today is 112/75. Patient denies any lightheadedness or dizziness. Patient denies headaches, blurred vision, chest pains, shortness of breath, or weakness. Denies any side effects from medication and is content with current medication.   Hyperlipidemia Patient is coming in for recheck of his hyperlipidemia. The patient is currently taking simvastatin and fish oils. They deny any issues with myalgias or history of liver damage from it. They deny any focal numbness or weakness or chest pain.   Pain assessment: Cause of pain-bilateral ankle pain due to trauma in the past and deformity. Pain location-bilateral ankles Pain on scale of 1-10- 6 Frequency-worse at night, almost all days as well What increases pain-being on feet, cold weather as well What makes pain Better-oxycodone Effects on ADL -limits mobility for prolonged walking but able to do most things by himself Any change in general medical condition-none  Current opioids rx-oxycodone 10 mg 4 times daily as needed # meds rx- 120 Effectiveness of current meds-works well Adverse reactions from pain meds-none Morphine equivalent-60  Pill count performed-No Last drug screen  -03/07/2020 ( high risk q49m moderate risk q679mlow risk yearly ) Urine drug screen today- Yes Was the [place]yes  If yes were their any concerning findings? -None  No flowsheet data found.   Pain contract signed on: Today  Relevant past medical, surgical, family and social history reviewed and updated as indicated. Interim medical history since our last visit reviewed. Allergies and medications reviewed and updated.  Review of Systems  Constitutional:  Negative for chills and fever.  Eyes:  Negative for discharge.  Respiratory:  Negative for shortness of breath and wheezing.   Cardiovascular:  Negative for chest pain and leg swelling.  Musculoskeletal:  Positive for arthralgias and gait problem. Negative for back pain.  Skin:  Negative for rash.  All other systems reviewed and are negative.  Per HPI unless specifically indicated above   Allergies as of 03/04/2021       Reactions   Fenofibrate Rash   Permament rash.        Medication List        Accurate as of March 04, 2021  1:55 PM. If you have any questions, ask your nurse or doctor.          Accu-Chek Guide w/Device Kit Test BS TID Dx E11.9   Accu-Chek Softclix Lancets lancets Test BS TID Dx E11.9   Fish Oil 1200 MG Cpdr Take 1,200 mg by mouth daily.   fluticasone 50 MCG/ACT nasal spray Commonly known as: FLONASE USE 1 SPRAY IN EACH NOSTRIL TWICE DAILY AS NEEDED FOR ALLERGIES OR RHINITIS (  SUBSTITUTED FOR FLONASE)   Fluzone Quadrivalent 0.5 ML injection Generic drug: influenza vac split quadrivalent PF   glipiZIDE 5 MG 24 hr tablet Commonly known as: GLUCOTROL XL Take 1 tablet (5 mg total) by mouth daily with breakfast.   glucose blood test strip Test BS TID Dx E11.9   ketoconazole 2 % cream Commonly known as: NIZORAL Apply 1 application topically daily.   lisinopril 20 MG tablet Commonly known as: ZESTRIL Take 1 tablet (20 mg total) by mouth daily.   metFORMIN 1000 MG tablet Commonly  known as: GLUCOPHAGE TAKE 1 TABLET TWICE DAILY WITH A MEAL   metoCLOPramide 5 MG tablet Commonly known as: REGLAN Take 1 tablet (5 mg total) by mouth 3 (three) times daily before meals.   omeprazole 40 MG capsule Commonly known as: PRILOSEC Take 1 capsule (40 mg total) by mouth daily.   Oxycodone HCl 10 MG Tabs Take 1 tablet (10 mg total) by mouth 4 (four) times daily as needed. What changed: Another medication with the same name was changed. Make sure you understand how and when to take each. Changed by: Fransisca Kaufmann Katieann Hungate, MD   Oxycodone HCl 10 MG Tabs Take 1 tablet (10 mg total) by mouth 4 (four) times daily as needed. Start taking on: April 03, 2021 What changed: These instructions start on April 03, 2021. If you are unsure what to do until then, ask your doctor or other care provider. Changed by: Fransisca Kaufmann Catarina Huntley, MD   Oxycodone HCl 10 MG Tabs Take 1 tablet (10 mg total) by mouth 4 (four) times daily as needed. Start taking on: May 03, 2021 What changed: These instructions start on May 03, 2021. If you are unsure what to do until then, ask your doctor or other care provider. Changed by: Fransisca Kaufmann Kadija Cruzen, MD   simvastatin 40 MG tablet Commonly known as: ZOCOR TAKE 1 TABLET EVERY DAY AT 6 PM   triamcinolone cream 0.1 % Commonly known as: KENALOG Apply 1 application topically 2 (two) times daily.   Trulicity 1.5 HY/8.5OY Sopn Generic drug: Dulaglutide Inject 1.5 mg into the skin once a week.         Objective:   BP 112/75    Pulse 91    Ht _0  (1.854 m)    Wt 257 lb 2 oz (116.6 kg)    SpO2 96%    BMI 33.92 kg/m   Wt Readings from Last 3 Encounters:  03/04/21 257 lb 2 oz (116.6 kg)  12/03/20 262 lb (118.8 kg)  09/22/20 261 lb 6.4 oz (118.6 kg)    Physical Exam Vitals and nursing note reviewed.  Constitutional:      General: He is not in acute distress.    Appearance: He is well-developed. He is not diaphoretic.  Eyes:     General: No scleral  icterus.    Conjunctiva/sclera: Conjunctivae normal.  Neck:     Thyroid: No thyromegaly.  Cardiovascular:     Rate and Rhythm: Normal rate and regular rhythm.     Heart sounds: Normal heart sounds. No murmur heard. Pulmonary:     Effort: Pulmonary effort is normal. No respiratory distress.     Breath sounds: Normal breath sounds. No wheezing.  Musculoskeletal:        General: Swelling (Trace) present. Normal range of motion.     Cervical back: Neck supple.  Lymphadenopathy:     Cervical: No cervical adenopathy.  Skin:    General: Skin is warm and dry.  Findings: No rash.  Neurological:     Mental Status: He is alert and oriented to person, place, and time.     Coordination: Coordination normal.  Psychiatric:        Behavior: Behavior normal.    A1c 6.6  Assessment & Plan:   Problem List Items Addressed This Visit       Cardiovascular and Mediastinum   Hypertension associated with diabetes ([place])   Relevant Medications   Dulaglutide (TRULICITY) 1.5 MB/8.6LJ SOPN   lisinopril (ZESTRIL) 20 MG tablet   Other Relevant Orders   CBC with Differential/Platelet   CMP14+EGFR   Lipid panel   Bayer DCA Hb A1c Waived   ToxASSURE Select 13 (MW), Urine     Endocrine   Diabetes mellitus, type 2 (HCC) - Primary   Relevant Medications   Dulaglutide (TRULICITY) 1.5 QG/9.2EF SOPN   lisinopril (ZESTRIL) 20 MG tablet   Other Relevant Orders   CBC with Differential/Platelet   CMP14+EGFR   Lipid panel   Bayer DCA Hb A1c Waived   ToxASSURE Select 13 (MW), Urine     Other   Chronic foot pain   Relevant Medications   Oxycodone HCl 10 MG TABS   Oxycodone HCl 10 MG TABS (Start on 05/03/2021)   Oxycodone HCl 10 MG TABS (Start on 04/03/2021)   Other Visit Diagnoses     Hyperlipidemia associated with type 2 diabetes mellitus (HCC)       Relevant Medications   Dulaglutide (TRULICITY) 1.5 EO/7.1QR SOPN   lisinopril (ZESTRIL) 20 MG tablet   Other Relevant Orders   CBC with  Differential/Platelet   CMP14+EGFR   Lipid panel   Bayer DCA Hb A1c Waived   ToxASSURE Select 13 (MW), Urine   Controlled substance agreement signed       Relevant Orders   ToxASSURE Select 13 (MW), Urine   Need for shingles vaccine       Relevant Orders   Varicella-zoster vaccine IM (Shingrix) (Completed)       Continue current medicine, seems to be doing well.  A1c looks good at 6.6. Follow up plan: Return in about 3 months (around 06/04/2021), or if symptoms worsen or fail to improve, for Diabetes and hypertension and cholesterol.  Counseling provided for all of the vaccine components Orders Placed This Encounter  Procedures   Varicella-zoster vaccine IM (Shingrix)   CBC with Differential/Platelet   CMP14+EGFR   Lipid panel   Bayer DCA Hb A1c Waived   ToxASSURE Select 13 (MW), Urine    Caryl Pina, MD [facility] 03/04/2021, 1:55 PM

## 2021-03-05 LAB — CBC WITH DIFFERENTIAL/PLATELET
Basophils Absolute: 0.1 10*3/uL (ref 0.0–0.2)
Basos: 1 %
EOS (ABSOLUTE): 0.2 10*3/uL (ref 0.0–0.4)
Eos: 2 %
Hematocrit: 48.2 % (ref 37.5–51.0)
Hemoglobin: 16.5 g/dL (ref 13.0–17.7)
Immature Grans (Abs): 0 10*3/uL (ref 0.0–0.1)
Immature Granulocytes: 0 %
Lymphocytes Absolute: 2.6 10*3/uL (ref 0.7–3.1)
Lymphs: 27 %
MCH: 31.4 pg (ref 26.6–33.0)
MCHC: 34.2 g/dL (ref 31.5–35.7)
MCV: 92 fL (ref 79–97)
Monocytes Absolute: 0.7 10*3/uL (ref 0.1–0.9)
Monocytes: 8 %
Neutrophils Absolute: 5.9 10*3/uL (ref 1.4–7.0)
Neutrophils: 62 %
Platelets: 235 10*3/uL (ref 150–450)
RBC: 5.25 x10E6/uL (ref 4.14–5.80)
RDW: 12.8 % (ref 11.6–15.4)
WBC: 9.5 10*3/uL (ref 3.4–10.8)

## 2021-03-05 LAB — LIPID PANEL
Chol/HDL Ratio: 3.2 ratio (ref 0.0–5.0)
Cholesterol, Total: 128 mg/dL (ref 100–199)
HDL: 40 mg/dL (ref >39)
LDL Chol Calc (NIH): 63 mg/dL (ref 0–99)
Triglycerides: 142 mg/dL (ref 0–149)
VLDL Cholesterol Cal: 25 mg/dL (ref 5–40)

## 2021-03-05 LAB — CMP14+EGFR
ALT: 25 IU/L (ref 0–44)
AST: 21 IU/L (ref 0–40)
Albumin/Globulin Ratio: 2 (ref 1.2–2.2)
Albumin: 4.7 g/dL (ref 3.8–4.9)
Alkaline Phosphatase: 91 IU/L (ref 44–121)
BUN/Creatinine Ratio: 17 (ref 9–20)
BUN: 17 mg/dL (ref 6–24)
Bilirubin Total: 0.3 mg/dL (ref 0.0–1.2)
CO2: 23 mmol/L (ref 20–29)
Calcium: 9.7 mg/dL (ref 8.7–10.2)
Chloride: 100 mmol/L (ref 96–106)
Creatinine, Ser: 1.02 mg/dL (ref 0.76–1.27)
Globulin, Total: 2.3 g/dL (ref 1.5–4.5)
Glucose: 107 mg/dL — ABNORMAL HIGH (ref 70–99)
Potassium: 4.6 mmol/L (ref 3.5–5.2)
Sodium: 137 mmol/L (ref 134–144)
Total Protein: 7 g/dL (ref 6.0–8.5)
eGFR: 87 mL/min/{1.73_m2} (ref >59)

## 2021-03-08 LAB — TOXASSURE SELECT 13 (MW), URINE

## 2021-03-23 ENCOUNTER — Encounter: Payer: Self-pay | Admitting: Nurse Practitioner

## 2021-03-23 ENCOUNTER — Other Ambulatory Visit: Payer: Self-pay

## 2021-03-23 ENCOUNTER — Ambulatory Visit: Payer: Medicare HMO | Admitting: Nurse Practitioner

## 2021-03-23 VITALS — BP 136/80 | HR 77 | Ht 73.0 in | Wt 260.2 lb

## 2021-03-23 DIAGNOSIS — I10 Essential (primary) hypertension: Secondary | ICD-10-CM

## 2021-03-23 DIAGNOSIS — E1169 Type 2 diabetes mellitus with other specified complication: Secondary | ICD-10-CM | POA: Diagnosis not present

## 2021-03-23 DIAGNOSIS — E1165 Type 2 diabetes mellitus with hyperglycemia: Secondary | ICD-10-CM

## 2021-03-23 DIAGNOSIS — E782 Mixed hyperlipidemia: Secondary | ICD-10-CM

## 2021-03-23 LAB — POCT UA - MICROALBUMIN
Albumin/Creatinine Ratio, Urine, POC: <30
Creatinine, POC: 200 mg/dL
Microalbumin Ur, POC: 80 mg/L

## 2021-03-23 MED ORDER — METFORMIN HCL 1000 MG PO TABS: 500.0000 mg | ORAL_TABLET | Freq: Two times a day (BID) | ORAL | 3 refills | Status: DC

## 2021-03-23 MED ORDER — TRULICITY 4.5 MG/0.5ML ~~LOC~~ SOAJ
4.5000 mg | SUBCUTANEOUS | 3 refills | Status: DC
Start: 2021-03-23 — End: 2021-09-23

## 2021-03-23 NOTE — Patient Instructions (Signed)

## 2021-03-23 NOTE — Progress Notes (Signed)
?                                               ?     03/23/2021, 8:45 AM ? ? ?Endocrinology follow-up note ? ? ?Subjective:  ? ? Patient ID: Timothy Nelson, male    DOB: Sep 11, 1965.  ?Timothy Nelson is being seen in follow-up after he was seen in consultation for management of currently uncontrolled symptomatic diabetes requested by  Dettinger, Fransisca Kaufmann, MD. ? ? ?Past Medical History:  ?Diagnosis Date  ? Arthritis   ? Barrett's esophagus   ? Chronic pain   ? Diabetes ([place])   ? Essential hypertension   ? Gastroparesis   ? GERD (gastroesophageal reflux disease)   ? Hyperlipidemia   ? Obesity   ? Type 2 diabetes mellitus ([place])   ? ? ?Past Surgical History:  ?Procedure Laterality Date  ? ESOPHAGOGASTRODUODENOSCOPY (EGD) WITH PROPOFOL N/A 04/04/2015  ? Procedure: ESOPHAGOGASTRODUODENOSCOPY (EGD) WITH PROPOFOL;  Surgeon: Rogene Houston, MD;  Location: [facility];  Service: Endoscopy;  Laterality: N/A;  8:30  ? FRACTURE SURGERY  March 2007  ? Left arm, left thumb, bilateral legs  ? HIP SURGERY Right   ? WRIST FRACTURE SURGERY Left   ? ? ?Social History  ? ?Socioeconomic History  ? Marital status: Married  ?  Spouse name: Not on file  ? Number of children: 1  ? Years of education: Not on file  ? Highest education level: Associate degree: occupational, Hotel manager, or vocational program  ?Occupational History  ? Occupation: Disability  ?Tobacco Use  ? Smoking status: Some Days  ?  Packs/day: 1.00  ?  Years: 20.00  ?  Pack years: 20.00  ?  Types: Cigars, Cigarettes  ? Smokeless tobacco: Never  ? Tobacco comments:  ?  Smoked intermittently for 20 years; 1-2 cigars weekly as of 24/2020  ?Vaping Use  ? Vaping Use: Never used  ?Substance and Sexual Activity  ? Alcohol use: Yes  ?  Alcohol/week: 1.0 standard drink  ?  Types: 1 Glasses of wine per week  ?  Comment: glass wine a week.   ? Drug use: No  ? Sexual activity: Yes  ?Other Topics Concern  ? Not on file  ?Social History Narrative  ? Not on file  ? ?Social  Determinants of Health  ? ?Financial Resource Strain: Not on file  ?Food Insecurity: Not on file  ?Transportation Needs: Not on file  ?Physical Activity: Not on file  ?Stress: Not on file  ?Social Connections: Not on file  ? ? ?Family History  ?Problem Relation Age of Onset  ? Diabetes Mother   ? Hypertension Mother   ? Hyperlipidemia Mother   ? Arthritis Mother   ? Esophageal cancer Mother   ?     patient unsure  ? Diabetes Father   ? Heart disease Father   ?     Age 76  ? Kidney disease Father   ? Hypertension Sister   ? Diabetes Daughter   ? Colon cancer Neg Hx   ? Stomach cancer Neg Hx   ? Pancreatic cancer Neg Hx   ? Liver disease Neg Hx   ? ? ?Outpatient Encounter Medications as of 03/23/2021  ?Medication Sig  ? Accu-Chek Softclix Lancets lancets Test BS TID Dx E11.9  ? Blood Glucose Monitoring Suppl (ACCU-CHEK GUIDE)  w/Device KIT Test BS TID Dx E11.9  ? Dulaglutide (TRULICITY) 4.5 TD/4.2AJ SOPN Inject 4.5 mg as directed once a week.  ? fluticasone (FLONASE) 50 MCG/ACT nasal spray USE 1 SPRAY IN EACH NOSTRIL TWICE DAILY AS NEEDED FOR ALLERGIES OR RHINITIS (SUBSTITUTED FOR FLONASE)  ? FLUZONE QUADRIVALENT 0.5 ML injection   ? glipiZIDE (GLUCOTROL XL) 5 MG 24 hr tablet Take 1 tablet (5 mg total) by mouth daily with breakfast.  ? glucose blood test strip Test BS TID Dx E11.9  ? ketoconazole (NIZORAL) 2 % cream Apply 1 application topically daily.  ? lisinopril (ZESTRIL) 20 MG tablet Take 1 tablet (20 mg total) by mouth daily.  ? metoCLOPramide (REGLAN) 5 MG tablet Take 1 tablet (5 mg total) by mouth 3 (three) times daily before meals.  ? Omega-3 Fatty Acids (FISH OIL) 1200 MG CPDR Take 1,200 mg by mouth daily.  ? omeprazole (PRILOSEC) 40 MG capsule Take 1 capsule (40 mg total) by mouth daily.  ? Oxycodone HCl 10 MG TABS Take 1 tablet (10 mg total) by mouth 4 (four) times daily as needed.  ? [START ON 05/03/2021] Oxycodone HCl 10 MG TABS Take 1 tablet (10 mg total) by mouth 4 (four) times daily as needed.  ? [START  ON 04/03/2021] Oxycodone HCl 10 MG TABS Take 1 tablet (10 mg total) by mouth 4 (four) times daily as needed.  ? simvastatin (ZOCOR) 40 MG tablet TAKE 1 TABLET EVERY DAY AT 6 PM  ? triamcinolone cream (KENALOG) 0.1 % Apply 1 application topically 2 (two) times daily.  ? [DISCONTINUED] Dulaglutide (TRULICITY) 1.5 GO/1.1XB SOPN Inject 1.5 mg into the skin once a week.  ? [DISCONTINUED] metFORMIN (GLUCOPHAGE) 1000 MG tablet TAKE 1 TABLET TWICE DAILY WITH A MEAL  ? metFORMIN (GLUCOPHAGE) 1000 MG tablet Take 0.5 tablets (500 mg total) by mouth 2 (two) times daily with a meal. TAKE 1 TABLET TWICE DAILY WITH A MEAL  ? ?No facility-administered encounter medications on file as of 03/23/2021.  ? ? ?ALLERGIES: ?Allergies  ?Allergen Reactions  ? Fenofibrate Rash  ?  Permament rash. ?  ? ? ?VACCINATION STATUS: ?Immunization History  ?Administered Date(s) Administered  ? Influenza,inj,Quad PF,6+ Mos 11/15/2018  ? Influenza-Unspecified 09/04/2016  ? Moderna Sars-Covid-2 Vaccination 03/13/2019, 04/10/2019, 11/06/2019, 06/19/2020  ? Pneumococcal Polysaccharide-23 09/16/2016  ? Zoster Recombinat (Shingrix) 09/03/2020, 03/04/2021  ? ? ?Diabetes ?He presents for his follow-up diabetic visit. He has type 2 diabetes mellitus. Onset time: He was diagnosed at approximate age of 55 years. His disease course has been stable. There are no hypoglycemic associated symptoms. Pertinent negatives for hypoglycemia include no confusion, headaches, pallor or seizures. Pertinent negatives for diabetes include no chest pain, no fatigue, no polydipsia, no polyphagia, no polyuria and no weakness. There are no hypoglycemic complications. Symptoms are stable. Diabetic complications include nephropathy (mild). Risk factors for coronary artery disease include diabetes mellitus, dyslipidemia, family history, obesity, male sex, hypertension, sedentary lifestyle and tobacco exposure. Current diabetic treatment includes oral agent (dual therapy) (and Trulicity).  He is compliant with treatment most of the time. His weight is fluctuating minimally. He is following a generally unhealthy diet. When asked about meal planning, he reported none. He has not had a previous visit with a dietitian. He rarely participates in exercise. (He presents today with no logs or meter to review.  His previsit A1c was 6.6% on 03/04/21 at his PCP office.  He reports symptoms of hypoglycemia when glucose reaches 100 but denies any recent episodes.  He  does report increased stress levels lately, has been caring for several other people.) An ACE inhibitor/angiotensin II receptor blocker is being taken. He does not see a podiatrist.Eye exam is current.  ?Hyperlipidemia ?This is a chronic problem. The current episode started more than 1 year ago. The problem is controlled. Recent lipid tests were reviewed and are normal. Exacerbating diseases include chronic renal disease, diabetes and obesity. Factors aggravating his hyperlipidemia include fatty foods. Pertinent negatives include no chest pain, myalgias or shortness of breath. Current antihyperlipidemic treatment includes statins. The current treatment provides moderate improvement of lipids. Compliance problems include adherence to diet and adherence to exercise.  Risk factors for coronary artery disease include dyslipidemia, diabetes mellitus, family history, hypertension, male sex, obesity and a sedentary lifestyle.  ?Hypertension ?This is a chronic problem. The current episode started more than 1 year ago. The problem has been resolved since onset. The problem is controlled. Pertinent negatives include no chest pain, headaches, neck pain, palpitations or shortness of breath. There are no associated agents to hypertension. Risk factors for coronary artery disease include diabetes mellitus, dyslipidemia, male gender, obesity, sedentary lifestyle, family history and smoking/tobacco exposure. Past treatments include ACE inhibitors. The current treatment  provides moderate improvement. There are no compliance problems.  Hypertensive end-organ damage includes kidney disease. Identifiable causes of hypertension include chronic renal disease.  ? ?Review of syste

## 2021-03-25 ENCOUNTER — Other Ambulatory Visit: Payer: Self-pay | Admitting: Family Medicine

## 2021-03-27 ENCOUNTER — Ambulatory Visit (INDEPENDENT_AMBULATORY_CARE_PROVIDER_SITE_OTHER): Payer: Medicare HMO | Admitting: Family Medicine

## 2021-03-27 ENCOUNTER — Encounter: Payer: Self-pay | Admitting: Family Medicine

## 2021-03-27 VITALS — BP 111/64 | HR 88 | Temp 98.1°F | Ht 73.0 in | Wt 256.1 lb

## 2021-03-27 DIAGNOSIS — M25521 Pain in right elbow: Secondary | ICD-10-CM | POA: Diagnosis not present

## 2021-03-27 MED ORDER — KETOROLAC TROMETHAMINE 60 MG/2ML IM SOLN
60.0000 mg | Freq: Once | INTRAMUSCULAR | Status: AC
  Administered 2021-03-27: 60 mg via INTRAMUSCULAR

## 2021-03-27 MED ORDER — METHYLPREDNISOLONE ACETATE 80 MG/ML IJ SUSP
80.0000 mg | Freq: Once | INTRAMUSCULAR | Status: AC
  Administered 2021-03-27: 80 mg via INTRAMUSCULAR

## 2021-03-27 NOTE — Progress Notes (Signed)
? ?Acute Office Visit ? ?Subjective:  ? ? Patient ID: Timothy Nelson, male    DOB: May 11, 1965, 56 y.o.   MRN: [id number] ? ?Chief Complaint  ?Patient presents with  ? Elbow Pain  ? ? ?HPI ?Patient is in today for right elbow pain. He denies injury but was doing a lot of activity yesterday with helping tear down a carport. Yesterday evening his elbow started hurting. The pain is sharp and intermittent. It is on there lateral side. It is with bending his elbow that he has pain. The pain is moderate when it occurs. He has had similar issues with the other elbow in the past that required joint injections. He has been taking his prescribed oxycodone for the pain. He will be driving for [place] this weekend and would like to have his pain improve as quickly as possible. He denies erythema, swelling, numbness, tingling, decreased ROM, or fever.  ? ?Past Medical History:  ?Diagnosis Date  ? Arthritis   ? Barrett's esophagus   ? Chronic pain   ? Diabetes ([place])   ? Essential hypertension   ? Gastroparesis   ? GERD (gastroesophageal reflux disease)   ? Hyperlipidemia   ? Obesity   ? Type 2 diabetes mellitus ([place])   ? ? ?Past Surgical History:  ?Procedure Laterality Date  ? ESOPHAGOGASTRODUODENOSCOPY (EGD) WITH PROPOFOL N/A 04/04/2015  ? Procedure: ESOPHAGOGASTRODUODENOSCOPY (EGD) WITH PROPOFOL;  Surgeon: Rogene Houston, MD;  Location: [facility];  Service: Endoscopy;  Laterality: N/A;  8:30  ? FRACTURE SURGERY  March 2007  ? Left arm, left thumb, bilateral legs  ? HIP SURGERY Right   ? WRIST FRACTURE SURGERY Left   ? ? ?Family History  ?Problem Relation Age of Onset  ? Diabetes Mother   ? Hypertension Mother   ? Hyperlipidemia Mother   ? Arthritis Mother   ? Esophageal cancer Mother   ?     patient unsure  ? Diabetes Father   ? Heart disease Father   ?     Age 26  ? Kidney disease Father   ? Hypertension Sister   ? Diabetes Daughter   ? Colon cancer Neg Hx   ? Stomach cancer Neg Hx   ? Pancreatic cancer Neg Hx   ? Liver disease Neg  Hx   ? ? ?Social History  ? ?Socioeconomic History  ? Marital status: Married  ?  Spouse name: Not on file  ? Number of children: 1  ? Years of education: Not on file  ? Highest education level: Associate degree: occupational, Hotel manager, or vocational program  ?Occupational History  ? Occupation: Disability  ?Tobacco Use  ? Smoking status: Some Days  ?  Packs/day: 1.00  ?  Years: 20.00  ?  Pack years: 20.00  ?  Types: Cigars, Cigarettes  ? Smokeless tobacco: Never  ? Tobacco comments:  ?  Smoked intermittently for 20 years; 1-2 cigars weekly as of 24/2020  ?Vaping Use  ? Vaping Use: Never used  ?Substance and Sexual Activity  ? Alcohol use: Yes  ?  Alcohol/week: 1.0 standard drink  ?  Types: 1 Glasses of wine per week  ?  Comment: glass wine a week.   ? Drug use: No  ? Sexual activity: Yes  ?Other Topics Concern  ? Not on file  ?Social History Narrative  ? Not on file  ? ?Social Determinants of Health  ? ?Financial Resource Strain: Not on file  ?Food Insecurity: Not on file  ?Transportation  Needs: Not on file  ?Physical Activity: Not on file  ?Stress: Not on file  ?Social Connections: Not on file  ?Intimate Partner Violence: Not on file  ? ? ?Outpatient Medications Prior to Visit  ?Medication Sig Dispense Refill  ? Accu-Chek Softclix Lancets lancets Test BS TID Dx E11.9 300 each 3  ? Blood Glucose Monitoring Suppl (ACCU-CHEK GUIDE) w/Device KIT Test BS TID Dx E11.9 1 kit 0  ? Dulaglutide (TRULICITY) 4.5 VZ/8.5YI SOPN Inject 4.5 mg as directed once a week. 6 mL 3  ? fluticasone (FLONASE) 50 MCG/ACT nasal spray USE 1 SPRAY IN EACH NOSTRIL TWICE DAILY AS NEEDED FOR ALLERGIES OR RHINITIS (SUBSTITUTED FOR FLONASE) 48 g 3  ? glipiZIDE (GLUCOTROL XL) 5 MG 24 hr tablet Take 1 tablet (5 mg total) by mouth daily with breakfast. 90 tablet 3  ? glucose blood test strip Test BS TID Dx E11.9 300 each 3  ? ketoconazole (NIZORAL) 2 % cream Apply 1 application topically daily. 60 g 2  ? lisinopril (ZESTRIL) 20 MG tablet Take 1  tablet (20 mg total) by mouth daily. 90 tablet 3  ? metFORMIN (GLUCOPHAGE) 1000 MG tablet Take 0.5 tablets (500 mg total) by mouth 2 (two) times daily with a meal. TAKE 1 TABLET TWICE DAILY WITH A MEAL 180 tablet 3  ? metoCLOPramide (REGLAN) 5 MG tablet Take 1 tablet (5 mg total) by mouth 3 (three) times daily before meals. 270 tablet 3  ? Omega-3 Fatty Acids (FISH OIL) 1200 MG CPDR Take 1,200 mg by mouth daily. 90 capsule 3  ? omeprazole (PRILOSEC) 40 MG capsule TAKE 1 CAPSULE EVERY DAY 90 capsule 0  ? Oxycodone HCl 10 MG TABS Take 1 tablet (10 mg total) by mouth 4 (four) times daily as needed. 120 tablet 0  ? [START ON 05/03/2021] Oxycodone HCl 10 MG TABS Take 1 tablet (10 mg total) by mouth 4 (four) times daily as needed. 120 tablet 0  ? [START ON 04/03/2021] Oxycodone HCl 10 MG TABS Take 1 tablet (10 mg total) by mouth 4 (four) times daily as needed. 120 tablet 0  ? simvastatin (ZOCOR) 40 MG tablet TAKE 1 TABLET EVERY DAY AT 6 PM 90 tablet 3  ? triamcinolone cream (KENALOG) 0.1 % Apply 1 application topically 2 (two) times daily. 454 g 1  ? FLUZONE QUADRIVALENT 0.5 ML injection     ? ?No facility-administered medications prior to visit.  ? ? ?Allergies  ?Allergen Reactions  ? Fenofibrate Rash  ?  Permament rash. ?  ? ? ?Review of Systems ?As per HPI.  ?   ?Objective:  ?  ?Physical Exam ?Vitals and nursing note reviewed.  ?Constitutional:   ?   General: He is not in acute distress. ?   Appearance: He is not ill-appearing, toxic-appearing or diaphoretic.  ?Pulmonary:  ?   Effort: Pulmonary effort is normal. No respiratory distress.  ?Musculoskeletal:  ?   Right elbow: No swelling, deformity, effusion or lacerations. Normal range of motion. Tenderness present in lateral epicondyle. No radial head, medial epicondyle or olecranon process tenderness.  ?   Right forearm: Normal.  ?   Right wrist: Normal.  ?   Right lower leg: No edema.  ?   Left lower leg: No edema.  ?Skin: ?   General: Skin is warm and dry.   ?Neurological:  ?   Mental Status: He is alert and oriented to person, place, and time.  ?Psychiatric:     ?   Mood and  Affect: Mood normal.     ?   Behavior: Behavior normal.  ? ? ?Ht 6' 1" (1.854 m)   Wt 256 lb 2 oz (116.2 kg)   BMI 33.79 kg/m?  ?Wt Readings from Last 3 Encounters:  ?03/27/21 256 lb 2 oz (116.2 kg)  ?03/23/21 260 lb 3.2 oz (118 kg)  ?03/04/21 257 lb 2 oz (116.6 kg)  ? ? ?There are no preventive care reminders to display for this patient. ? ?There are no preventive care reminders to display for this patient. ? ? ?Lab Results  ?Component Value Date  ? TSH 4.170 09/03/2020  ? ?Lab Results  ?Component Value Date  ? WBC 9.5 03/04/2021  ? HGB 16.5 03/04/2021  ? HCT 48.2 03/04/2021  ? MCV 92 03/04/2021  ? PLT 235 03/04/2021  ? ?Lab Results  ?Component Value Date  ? NA 137 03/04/2021  ? K 4.6 03/04/2021  ? CO2 23 03/04/2021  ? GLUCOSE 107 (H) 03/04/2021  ? BUN 17 03/04/2021  ? CREATININE 1.02 03/04/2021  ? BILITOT 0.3 03/04/2021  ? ALKPHOS 91 03/04/2021  ? AST 21 03/04/2021  ? ALT 25 03/04/2021  ? PROT 7.0 03/04/2021  ? ALBUMIN 4.7 03/04/2021  ? CALCIUM 9.7 03/04/2021  ? ANIONGAP 11 04/03/2015  ? EGFR 87 03/04/2021  ? ?Lab Results  ?Component Value Date  ? CHOL 128 03/04/2021  ? ?Lab Results  ?Component Value Date  ? HDL 40 03/04/2021  ? ?Lab Results  ?Component Value Date  ? [place] 63 03/04/2021  ? ?Lab Results  ?Component Value Date  ? TRIG 142 03/04/2021  ? ?Lab Results  ?Component Value Date  ? CHOLHDL 3.2 03/04/2021  ? ?Lab Results  ?Component Value Date  ? HGBA1C 6.6 (H) 03/04/2021  ? ? ?   ?Assessment & Plan:  ? ?Lester was seen today for elbow pain. ? ?Diagnoses and all orders for this visit: ? ?Right elbow pain ?Consistent with lateral epicondylitis. Toradol and steroid IM injection today in the office. Discussed rest, ice, heat, bracing, stretching for symptoms. Return to office for new or worsening symptoms, or if symptoms persist.  ?-     ketorolac (TORADOL) injection 60 mg ?-      methylPREDNISolone acetate (DEPO-MEDROL) injection 80 mg ? ?Return to office for new or worsening symptoms, or if symptoms persist.  ? ?The patient indicates understanding of these issues and agrees with the plan. ? ?Tiffany M

## 2021-03-27 NOTE — Patient Instructions (Signed)
Musculoskeletal Pain ?Musculoskeletal pain refers to aches and pains in your bones, joints, muscles, and the tissues that surround them. This pain can occur in any part of the body. It can last for a short time (acute) or a long time (chronic). ?A physical exam, lab tests, and imaging studies may be done to find the cause of your musculoskeletal pain. ?Follow these instructions at home: ?Lifestyle ?Try to control or lower your stress levels. Stress increases muscle tension and can worsen musculoskeletal pain. It is important to recognize when you are anxious or stressed and learn ways to manage it. This may include: ?Meditation or yoga. ?Cognitive or behavioral therapy. ?Acupuncture or massage therapy. ?You may continue all activities unless the activities cause more pain. When the pain gets better, slowly resume your normal activities. Gradually increase the intensity and duration of your activities or exercise. ?Managing pain, stiffness, and swelling ?  ?Treatment may include medicines for pain and inflammation that are taken by mouth or applied to the skin. Take over-the-counter and prescription medicines only as told by your health care provider. ?When your pain is severe, bed rest may be helpful. Lie or sit in any position that is comfortable, but get out of bed and walk around at least every couple of hours. ?If directed, apply heat to the affected area as often as told by your health care provider. Use the heat source that your health care provider recommends, such as a moist heat pack or a heating pad. ?Place a towel between your skin and the heat source. ?Leave the heat on for 20-30 minutes. ?Remove the heat if your skin turns bright red. This is especially important if you are unable to feel pain, heat, or cold. You may have a greater risk of getting burned. ?If directed, put ice on the painful area. To do this: ?Put ice in a plastic bag. ?Place a towel between your skin and the bag. ?Leave the ice on for  20 minutes, 2-3 times a day. ?Remove the ice if your skin turns bright red. This is very important. If you cannot feel pain, heat, or cold, you have a greater risk of damage to the area. ?General instructions ?Your health care provider may recommend that you see a physical therapist. This person can help you come up with a safe exercise program. ?If told by your health care provider, do physical therapy exercises to improve movement and strength in the affected area. ?Keep all follow-up visits. This is important. This includes any physical therapy visits. ?Contact a health care provider if: ?Your pain gets worse. ?Medicines do not help ease your pain. ?You cannot use the part of your body that hurts, such as your arm, leg, or neck. ?You have trouble sleeping. ?You have trouble doing your normal activities. ?Get help right away if: ?You have a new injury and your pain is worse or different. ?You feel numb or you have tingling in the painful area. ?Summary ?Musculoskeletal pain refers to aches and pains in your bones, joints, muscles, and the tissues that surround them. ?This pain can occur in any part of the body. ?Your health care provider may recommend that you see a physical therapist. This person can help you come up with a safe exercise program. Do any exercises as told by your physical therapist. ?Lower your stress level. Stress can worsen musculoskeletal pain. Ways to lower stress may include meditation, yoga, cognitive or behavioral therapy, acupuncture, and massage therapy. ?This information is not intended to replace  advice given to you by your health care provider. Make sure you discuss any questions you have with your health care provider. ?Document Revised: 04/26/2019 Document Reviewed: 04/04/2019 ?Elsevier Patient Education ? [place]. ? ?

## 2021-04-02 ENCOUNTER — Telehealth: Payer: Self-pay | Admitting: Family Medicine

## 2021-04-02 NOTE — Telephone Encounter (Signed)
From her note this sounds like a tendinitis so yes I can do this type of injection.  I do not do elbow joints but I do do muscular and tendon injections which is what this sounds like from her note ?

## 2021-04-02 NOTE — Telephone Encounter (Signed)
Appt made for 4/3 at 3:55. Pt made aware. ?

## 2021-04-02 NOTE — Telephone Encounter (Signed)
Do you do elbow injections? ?

## 2021-04-06 ENCOUNTER — Encounter: Payer: Self-pay | Admitting: Family Medicine

## 2021-04-06 ENCOUNTER — Ambulatory Visit (INDEPENDENT_AMBULATORY_CARE_PROVIDER_SITE_OTHER): Payer: Medicare HMO | Admitting: Family Medicine

## 2021-04-06 VITALS — BP 118/77 | HR 82 | Temp 97.9°F | Ht 73.0 in | Wt 254.6 lb

## 2021-04-06 DIAGNOSIS — M7711 Lateral epicondylitis, right elbow: Secondary | ICD-10-CM | POA: Diagnosis not present

## 2021-04-06 MED ORDER — METHYLPREDNISOLONE ACETATE 40 MG/ML IJ SUSP
40.0000 mg | Freq: Once | INTRAMUSCULAR | Status: AC
  Administered 2021-04-06: 40 mg via INTRAMUSCULAR

## 2021-04-06 NOTE — Progress Notes (Signed)
? ?  BP 118/77   Pulse 82   Temp 97.9 ?F (36.6 ?C) (Temporal)   Ht '6\' 1"'$  (1.854 m)   Wt 254 lb 9.6 oz (115.5 kg)   BMI 33.59 kg/m?   ? ?Subjective:  ? ?Patient ID: Timothy Nelson, male    DOB: 1965/07/11, 56 y.o.   MRN: [id number] ? ?HPI: ?Timothy Nelson is a 56 y.o. male presenting on 04/06/2021 for elbow injection (Right elbow ) ? ? ?HPI ?Right elbow pain/tendinitis ?Patient was seen on 03/27/2021 for right elbow pain/tendinitis and given Toradol and Depo Medrol injection and improved for a day but then came right back.  He says its been bothering him since the 23rd which is just over a week ago and he was very active the day before it got flared up using that arm. ? ?Relevant past medical, surgical, family and social history reviewed and updated as indicated. Interim medical history since our last visit reviewed. ?Allergies and medications reviewed and updated. ? ?Review of Systems  ?Constitutional:  Negative for chills and fever.  ?Musculoskeletal:  Positive for arthralgias and myalgias.  ? ?Per HPI unless specifically indicated above ? ? ? ? ? ? ?Objective:  ? ?BP 118/77   Pulse 82   Temp 97.9 ?F (36.6 ?C) (Temporal)   Ht '6\' 1"'$  (1.854 m)   Wt 254 lb 9.6 oz (115.5 kg)   BMI 33.59 kg/m?   ?Wt Readings from Last 3 Encounters:  ?04/06/21 254 lb 9.6 oz (115.5 kg)  ?03/27/21 256 lb 2 oz (116.2 kg)  ?03/23/21 260 lb 3.2 oz (118 kg)  ?  ?Physical Exam ?Vitals and nursing note reviewed.  ?Constitutional:   ?   Appearance: Normal appearance.  ?Neurological:  ?   Mental Status: He is alert.  ? ? ? ?Lateral epicondylitis injection: Consent form signed. Risk factors of bleeding and infection discussed with patient and patient is agreeable towards injection. Patient prepped with Betadine. Lateral approach towards injection used. Injected 40 mg of Depo-Medrol and 1 mL of 2% lidocaine. Patient tolerated procedure well and no side effects from noted. Minimal to no bleeding. Simple bandage applied after.  ?Assessment & Plan:   ? ?Problem List Items Addressed This Visit   ?None ?Visit Diagnoses   ? ? Lateral epicondylitis of right elbow    -  Primary  ? Relevant Medications  ? methylPREDNISolone acetate (DEPO-MEDROL) injection 40 mg (Start on 04/06/2021  4:00 PM)  ? ?  ?  ? ?Follow up plan: ?No follow-ups on file. ? ?Counseling provided for all of the vaccine components ?No orders of the defined types were placed in this encounter. ? ? ?Caryl Pina, MD ?[place] ?04/06/2021, 3:50 PM ? ? ? ? ?

## 2021-04-07 ENCOUNTER — Ambulatory Visit (INDEPENDENT_AMBULATORY_CARE_PROVIDER_SITE_OTHER): Payer: Medicare HMO

## 2021-04-07 VITALS — Wt 254.0 lb

## 2021-04-07 DIAGNOSIS — Z0000 Encounter for general adult medical examination without abnormal findings: Secondary | ICD-10-CM

## 2021-04-07 NOTE — Patient Instructions (Signed)
[name] , ?Thank you for taking time to come for your Medicare Wellness Visit. I appreciate your ongoing commitment to your health goals. Please review the following plan we discussed and let me know if I can assist you in the future.  ? ?Screening recommendations/referrals: ?Colonoscopy: Done 08/09/2018 - Repeat in 5-10 years  ?Recommended yearly ophthalmology/optometry visit for glaucoma screening and checkup ?Recommended yearly dental visit for hygiene and checkup ? ?Vaccinations: ?Influenza vaccine: Declined - recommended every fall ?Pneumococcal vaccine: Done 09/16/2016 - Ask about Prevnar ?Tdap vaccine: Done 04/05/2011 - Repeat in 10 years *due ?Shingles vaccine: Done 09/03/2020 & 03/04/2021   ?Covid-19: Done 03/13/2019, 04/10/2019, 11/06/2019, 06/19/2020, & 02/14/2021 ? ?Advanced directives: Please bring a copy of your health care power of attorney and living will to the office to be added to your chart at your convenience.  ? ?Conditions/risks identified: Aim for 30 minutes of exercise or brisk walking, 6-8 glasses of water, and 5 servings of fruits and vegetables each day.  ? ?Next appointment: Follow up in one year for your annual wellness visit  ? ?Preventive Care 40-64 Years, Male ?Preventive care refers to lifestyle choices and visits with your health care provider that can promote health and wellness. ?What does preventive care include? ?A yearly physical exam. This is also called an annual well check. ?Dental exams once or twice a year. ?Routine eye exams. Ask your health care provider how often you should have your eyes checked. ?Personal lifestyle choices, including: ?Daily care of your teeth and gums. ?Regular physical activity. ?Eating a healthy diet. ?Avoiding tobacco and drug use. ?Limiting alcohol use. ?Practicing safe sex. ?Taking low-dose aspirin every day starting at age 93. ?What happens during an annual well check? ?The services and screenings done by your health care provider during your annual well  check will depend on your age, overall health, lifestyle risk factors, and family history of disease. ?Counseling  ?Your health care provider may ask you questions about your: ?Alcohol use. ?Tobacco use. ?Drug use. ?Emotional well-being. ?Home and relationship well-being. ?Sexual activity. ?Eating habits. ?Work and work Statistician. ?Screening  ?You may have the following tests or measurements: ?Height, weight, and BMI. ?Blood pressure. ?Lipid and cholesterol levels. These may be checked every 5 years, or more frequently if you are over 29 years old. ?Skin check. ?Lung cancer screening. You may have this screening every year starting at age 49 if you have a 30-pack-year history of smoking and currently smoke or have quit within the past 15 years. ?Fecal occult blood test (FOBT) of the stool. You may have this test every year starting at age 40. ?Flexible sigmoidoscopy or colonoscopy. You may have a sigmoidoscopy every 5 years or a colonoscopy every 10 years starting at age 76. ?Prostate cancer screening. Recommendations will vary depending on your family history and other risks. ?Hepatitis C blood test. ?Hepatitis B blood test. ?Sexually transmitted disease (STD) testing. ?Diabetes screening. This is done by checking your blood sugar (glucose) after you have not eaten for a while (fasting). You may have this done every 1-3 years. ?Discuss your test results, treatment options, and if necessary, the need for more tests with your health care provider. ?Vaccines  ?Your health care provider may recommend certain vaccines, such as: ?Influenza vaccine. This is recommended every year. ?Tetanus, diphtheria, and acellular pertussis (Tdap, Td) vaccine. You may need a Td booster every 10 years. ?Zoster vaccine. You may need this after age 38. ?Pneumococcal 13-valent conjugate (PCV13) vaccine. You may need this  if you have certain conditions and have not been vaccinated. ?Pneumococcal polysaccharide (PPSV23) vaccine. You may  need one or two doses if you smoke cigarettes or if you have certain conditions. ?Talk to your health care provider about which screenings and vaccines you need and how often you need them. ?This information is not intended to replace advice given to you by your health care provider. Make sure you discuss any questions you have with your health care provider. ?Document Released: 01/17/2015 Document Revised: 09/10/2015 Document Reviewed: 10/22/2014 ?Elsevier Interactive Patient Education ? 2017 [place]. ? ?Fall Prevention in the Home ?Falls can cause injuries. They can happen to people of all ages. There are many things you can do to make your home safe and to help prevent falls. ?What can I do on the outside of my home? ?Regularly fix the edges of walkways and driveways and fix any cracks. ?Remove anything that might make you trip as you walk through a door, such as a raised step or threshold. ?Trim any bushes or trees on the path to your home. ?Use bright outdoor lighting. ?Clear any walking paths of anything that might make someone trip, such as rocks or tools. ?Regularly check to see if handrails are loose or broken. Make sure that both sides of any steps have handrails. ?Any raised decks and porches should have guardrails on the edges. ?Have any leaves, snow, or ice cleared regularly. ?Use sand or salt on walking paths during winter. ?Clean up any spills in your garage right away. This includes oil or grease spills. ?What can I do in the bathroom? ?Use night lights. ?Install grab bars by the toilet and in the tub and shower. Do not use towel bars as grab bars. ?Use non-skid mats or decals in the tub or shower. ?If you need to sit down in the shower, use a plastic, non-slip stool. ?Keep the floor dry. Clean up any water that spills on the floor as soon as it happens. ?Remove soap buildup in the tub or shower regularly. ?Attach bath mats securely with double-sided non-slip rug tape. ?Do not have throw rugs  and other things on the floor that can make you trip. ?What can I do in the bedroom? ?Use night lights. ?Make sure that you have a light by your bed that is easy to reach. ?Do not use any sheets or blankets that are too big for your bed. They should not hang down onto the floor. ?Have a firm chair that has side arms. You can use this for support while you get dressed. ?Do not have throw rugs and other things on the floor that can make you trip. ?What can I do in the kitchen? ?Clean up any spills right away. ?Avoid walking on wet floors. ?Keep items that you use a lot in easy-to-reach places. ?If you need to reach something above you, use a strong step stool that has a grab bar. ?Keep electrical cords out of the way. ?Do not use floor polish or wax that makes floors slippery. If you must use wax, use non-skid floor wax. ?Do not have throw rugs and other things on the floor that can make you trip. ?What can I do with my stairs? ?Do not leave any items on the stairs. ?Make sure that there are handrails on both sides of the stairs and use them. Fix handrails that are broken or loose. Make sure that handrails are as long as the stairways. ?Check any carpeting to make sure that it  is firmly attached to the stairs. Fix any carpet that is loose or worn. ?Avoid having throw rugs at the top or bottom of the stairs. If you do have throw rugs, attach them to the floor with carpet tape. ?Make sure that you have a light switch at the top of the stairs and the bottom of the stairs. If you do not have them, ask someone to add them for you. ?What else can I do to help prevent falls? ?Wear shoes that: ?Do not have high heels. ?Have rubber bottoms. ?Are comfortable and fit you well. ?Are closed at the toe. Do not wear sandals. ?If you use a stepladder: ?Make sure that it is fully opened. Do not climb a closed stepladder. ?Make sure that both sides of the stepladder are locked into place. ?Ask someone to hold it for you, if  possible. ?Clearly mark and make sure that you can see: ?Any grab bars or handrails. ?First and last steps. ?Where the edge of each step is. ?Use tools that help you move around (mobility aids) if they are needed. The

## 2021-04-07 NOTE — Progress Notes (Signed)
? ?Subjective:  ? Timothy Nelson is a 56 y.o. male who presents for Medicare Annual/Subsequent preventive examination. ? ?Virtual Visit via Telephone Note ? ?I connected with  Timothy Nelson on 04/07/21 at  2:45 PM EDT by telephone and verified that I am speaking with the correct person using two identifiers. ? ?Location: ?Patient: Home ?Provider: [facility] ?Persons participating in the virtual visit: patient/Nurse Health Advisor ?  ?I discussed the limitations, risks, security and privacy concerns of performing an evaluation and management service by telephone and the availability of in person appointments. The patient expressed understanding and agreed to proceed. ? ?Interactive audio and video telecommunications were attempted between this nurse and patient, however failed, due to patient having technical difficulties OR patient did not have access to video capability.  We continued and completed visit with audio only. ? ?Some vital signs may be absent or patient reported.  ? ?Timothy Ehresman Dionne Ano, LPN  ? ?Review of Systems    ? ?Cardiac Risk Factors include: advanced age (>80mn, >>30women);diabetes mellitus;dyslipidemia;hypertension;male gender;sedentary lifestyle;obesity (BMI >30kg/m2);family history of premature cardiovascular disease;smoking/ tobacco exposure ? ?   ?Objective:  ?  ?Today's Vitals  ? 04/07/21 1430  ?Weight: 254 lb (115.2 kg)  ?PainSc: 3   ? ?Body mass index is 33.51 kg/m?. ? ? ?  04/07/2021  ?  2:38 PM 04/02/2020  ?  3:32 PM 04/01/2020  ?  8:20 AM 12/06/2017  ?  3:05 PM 09/21/2017  ?  9:58 AM 09/29/2016  ?  9:05 AM 04/04/2015  ?  7:27 AM  ?Advanced Directives  ?Does Patient Have a Medical Advance Directive? _0  No No  ?Would patient like information on creating a medical advance directive? No - Patient declined No - Patient declined No - Patient declined Yes (MAU/Ambulatory/Procedural Areas - Information given)  Yes (ED - Information included in AVS) No - patient declined information  ? ? ?Current  Medications (verified) ?Outpatient Encounter Medications as of 04/07/2021  ?Medication Sig  ? Dulaglutide (TRULICITY) 4.5 MZR/0.0TMSOPN Inject 4.5 mg as directed once a week.  ? fluticasone (FLONASE) 50 MCG/ACT nasal spray USE 1 SPRAY IN EACH NOSTRIL TWICE DAILY AS NEEDED FOR ALLERGIES OR RHINITIS (SUBSTITUTED FOR FLONASE)  ? glipiZIDE (GLUCOTROL XL) 5 MG 24 hr tablet Take 1 tablet (5 mg total) by mouth daily with breakfast.  ? ketoconazole (NIZORAL) 2 % cream Apply 1 application topically daily.  ? lisinopril (ZESTRIL) 20 MG tablet Take 1 tablet (20 mg total) by mouth daily.  ? metFORMIN (GLUCOPHAGE) 1000 MG tablet Take 0.5 tablets (500 mg total) by mouth 2 (two) times daily with a meal. TAKE 1 TABLET TWICE DAILY WITH A MEAL  ? metoCLOPramide (REGLAN) 5 MG tablet Take 1 tablet (5 mg total) by mouth 3 (three) times daily before meals.  ? Omega-3 Fatty Acids (FISH OIL) 1200 MG CPDR Take 1,200 mg by mouth daily.  ? omeprazole (PRILOSEC) 40 MG capsule TAKE 1 CAPSULE EVERY DAY  ? Oxycodone HCl 10 MG TABS Take 1 tablet (10 mg total) by mouth 4 (four) times daily as needed.  ? simvastatin (ZOCOR) 40 MG tablet TAKE 1 TABLET EVERY DAY AT 6 PM  ? triamcinolone cream (KENALOG) 0.1 % Apply 1 application topically 2 (two) times daily.  ? Accu-Chek Softclix Lancets lancets Test BS TID Dx E11.9 (Patient not taking: Reported on 04/07/2021)  ? Blood Glucose Monitoring Suppl (ACCU-CHEK GUIDE) w/Device KIT Test BS TID Dx E11.9 (Patient not taking: Reported on 04/07/2021)  ?  glucose blood test strip Test BS TID Dx E11.9 (Patient not taking: Reported on 04/07/2021)  ? Oxycodone HCl 10 MG TABS Take 1 tablet (10 mg total) by mouth 4 (four) times daily as needed.  ? [START ON 05/03/2021] Oxycodone HCl 10 MG TABS Take 1 tablet (10 mg total) by mouth 4 (four) times daily as needed.  ? ?No facility-administered encounter medications on file as of 04/07/2021.  ? ? ?Allergies (verified) ?Fenofibrate  ? ?History: ?Past Medical History:  ?Diagnosis Date  ?  Arthritis   ? Barrett's esophagus   ? Chronic pain   ? Diabetes ([place])   ? Essential hypertension   ? Gastroparesis   ? GERD (gastroesophageal reflux disease)   ? Hyperlipidemia   ? Obesity   ? Type 2 diabetes mellitus ([place])   ? ?Past Surgical History:  ?Procedure Laterality Date  ? ESOPHAGOGASTRODUODENOSCOPY (EGD) WITH PROPOFOL N/A 04/04/2015  ? Procedure: ESOPHAGOGASTRODUODENOSCOPY (EGD) WITH PROPOFOL;  Surgeon: Rogene Houston, MD;  [facility];  Service: Endoscopy;  Laterality: N/A;  8:30  ? FRACTURE SURGERY  March 2007  ? Left arm, left thumb, bilateral legs  ? HIP SURGERY Right   ? WRIST FRACTURE SURGERY Left   ? ?Family History  ?Problem Relation Age of Onset  ? Diabetes Mother   ? Hypertension Mother   ? Hyperlipidemia Mother   ? Arthritis Mother   ? Esophageal cancer Mother   ?     patient unsure  ? Diabetes Father   ? Heart disease Father   ?     Age 46  ? Kidney disease Father   ? Hypertension Sister   ? Diabetes Daughter   ? Colon cancer Neg Hx   ? Stomach cancer Neg Hx   ? Pancreatic cancer Neg Hx   ? Liver disease Neg Hx   ? ?Social History  ? ?Socioeconomic History  ? Marital status: Married  ?  Spouse name: Not on file  ? Number of children: 1  ? Years of education: Not on file  ? Highest education level: Associate degree: occupational, Hotel manager, or vocational program  ?Occupational History  ? Occupation: Disability  ?Tobacco Use  ? Smoking status: Some Days  ?  Packs/day: 1.00  ?  Years: 20.00  ?  Pack years: 20.00  ?  Types: Cigars, Cigarettes  ? Smokeless tobacco: Never  ? Tobacco comments:  ?  Smoked intermittently for 20 years; 1-2 cigars weekly as of 02/07/2018  ?Vaping Use  ? Vaping Use: Never used  ?Substance and Sexual Activity  ? Alcohol use: Yes  ?  Alcohol/week: 1.0 standard drink  ?  Types: 1 Glasses of wine per week  ?  Comment: glass wine a week.   ? Drug use: No  ? Sexual activity: Yes  ?Other Topics Concern  ? Not on file  ?Social History Narrative  ? Not on file  ? ?Social  Determinants of Health  ? ?Financial Resource Strain: Low Risk   ? Difficulty of Paying Living Expenses: Not hard at all  ?Food Insecurity: No Food Insecurity  ? Worried About Charity fundraiser in the Last Year: Never true  ? Ran Out of Food in the Last Year: Never true  ?Transportation Needs: No Transportation Needs  ? Lack of Transportation (Medical): No  ? Lack of Transportation (Non-Medical): No  ?Physical Activity: Inactive  ? Days of Exercise per Week: 0 days  ? Minutes of Exercise per Session: 0 min  ?Stress: No  Stress Concern Present  ? Feeling of Stress : Not at all  ?Social Connections: Socially Integrated  ? Frequency of Communication with Friends and Family: More than three times a week  ? Frequency of Social Gatherings with Friends and Family: More than three times a week  ? Attends Religious Services: More than 4 times per year  ? Active Member of Clubs or Organizations: Yes  ? Attends Archivist Meetings: More than 4 times per year  ? Marital Status: Married  ? ? ?Tobacco Counseling ?Ready to quit: Not Answered ?Counseling given: Not Answered ?Tobacco comments: Smoked intermittently for 20 years; 1-2 cigars weekly as of 02/07/2018 ? ? ?Clinical Intake: ? ?Pre-visit preparation completed: Yes ? ?Pain : 0-10 ?Pain Score: 3  ?Pain Type: Acute pain ?Pain Location: Elbow ?Pain Orientation: Right ?Pain Descriptors / Indicators: Aching, Discomfort ?Pain Onset: 1 to 4 weeks ago ?Pain Frequency: Intermittent ? ?  ? ?BMI - recorded: 33.51 ?Nutritional Status: BMI > 30  Obese ?Nutritional Risks: None ?Diabetes: Yes ?CBG done?: No ?Did pt. bring in CBG monitor from home?: No ? ?How often do you need to have someone help you when you read instructions, pamphlets, or other written materials from your doctor or pharmacy?: 1 - Never ? ?Diabetic? Nutrition Risk Assessment: ? ?Has the patient had any N/V/D within the last 2 months?  No  ?Does the patient have any non-healing wounds?  No  ?Has the patient had  any unintentional weight loss or weight gain?  No  ? ?Diabetes: ? ?Is the patient diabetic?  Yes  ?If diabetic, was a CBG obtained today?  No  ?Did the patient bring in their glucometer from home?  No  ?Ho

## 2021-06-03 ENCOUNTER — Ambulatory Visit (INDEPENDENT_AMBULATORY_CARE_PROVIDER_SITE_OTHER): Payer: Medicare HMO | Admitting: Family Medicine

## 2021-06-03 ENCOUNTER — Encounter: Payer: Self-pay | Admitting: Family Medicine

## 2021-06-03 VITALS — BP 140/88 | HR 74 | Temp 98.0°F | Ht 73.0 in | Wt 258.0 lb

## 2021-06-03 DIAGNOSIS — E1159 Type 2 diabetes mellitus with other circulatory complications: Secondary | ICD-10-CM | POA: Diagnosis not present

## 2021-06-03 DIAGNOSIS — E1169 Type 2 diabetes mellitus with other specified complication: Secondary | ICD-10-CM | POA: Diagnosis not present

## 2021-06-03 DIAGNOSIS — I152 Hypertension secondary to endocrine disorders: Secondary | ICD-10-CM | POA: Diagnosis not present

## 2021-06-03 DIAGNOSIS — M79671 Pain in right foot: Secondary | ICD-10-CM | POA: Diagnosis not present

## 2021-06-03 DIAGNOSIS — G8929 Other chronic pain: Secondary | ICD-10-CM | POA: Diagnosis not present

## 2021-06-03 DIAGNOSIS — E782 Mixed hyperlipidemia: Secondary | ICD-10-CM | POA: Diagnosis not present

## 2021-06-03 LAB — BAYER DCA HB A1C WAIVED: HB A1C (BAYER DCA - WAIVED): 6.6 % — ABNORMAL HIGH (ref 4.8–5.6)

## 2021-06-03 MED ORDER — OXYCODONE HCL 10 MG PO TABS: 10.0000 mg | ORAL_TABLET | Freq: Four times a day (QID) | ORAL | 0 refills | Status: DC | PRN

## 2021-06-03 NOTE — Progress Notes (Signed)
BP 140/88   Pulse 74   Temp 98 F (36.7 C)   Ht _0  (1.854 m)   Wt 258 lb (117 kg)   SpO2 97%   BMI 34.04 kg/m    Subjective:   Patient ID: Timothy Nelson, male    DOB: 1965/10/09, 56 y.o.   MRN: [id number]  HPI: Timothy Nelson is a 56 y.o. male presenting on 06/03/2021 for Medical Management of Chronic Issues and Diabetes   HPI Pain assessment: Cause of pain-bilateral ankle pain due to history of trauma and arthritis Pain location-bilateral ankles Pain on scale of 1-10- 5 Frequency-Daily What increases pain-being on his feet What makes pain Better-rest and medication helps Effects on ADL -limits him on his ability to perform daily jobs and be on his feet for long periods but able to do most of the day-to-day things at home Any change in general medical condition-none  Current opioids rx-oxycodone 10 mg 4 times daily as needed # meds rx-120 Effectiveness of current meds-works well Adverse reactions from pain meds-none Morphine equivalent-60  Pill count performed-No Last drug screen -03/26/2021 ( high risk q53m moderate risk q647mlow risk yearly ) Urine drug screen today- No [place]  If yes were their any concerning findings? -None  Pain contract signed on: 03/26/2021  Hypertension Patient is currently on lisinopril, and their blood pressure today is 140/88. Patient denies any lightheadedness or dizziness. Patient denies headaches, blurred vision, chest pains, shortness of breath, or weakness. Denies any side effects from medication and is content with current medication.   Type 2 diabetes mellitus Patient comes in today for recheck of his diabetes. Patient has been currently taking glipizide and metformin and Trulicity, A1T1Xooks good at 6.6. Patient is currently on an ACE inhibitor/ARB. Patient has not seen an ophthalmologist this year. Patient denies any issues with their feet. The symptom started onset as an adult hyperlipidemia and hypertension ARE  RELATED TO DM   Hyperlipidemia Patient is coming in for recheck of his hyperlipidemia. The patient is currently taking simvastatin. They deny any issues with myalgias or history of liver damage from it. They deny any focal numbness or weakness or chest pain.   Relevant past medical, surgical, family and social history reviewed and updated as indicated. Interim medical history since our last visit reviewed. Allergies and medications reviewed and updated.  Review of Systems  Constitutional:  Negative for chills and fever.  Eyes:  Negative for visual disturbance.  Respiratory:  Negative for shortness of breath and wheezing.   Cardiovascular:  Negative for chest pain and leg swelling.  Musculoskeletal:  Negative for back pain and gait problem.  Skin:  Negative for rash.  Neurological:  Negative for dizziness and light-headedness.  Psychiatric/Behavioral:  Negative for dysphoric mood, self-injury, sleep disturbance and suicidal ideas. The patient is not nervous/anxious.   All other systems reviewed and are negative.  Per HPI unless specifically indicated above   Allergies as of 06/03/2021       Reactions   Fenofibrate Rash   Permament rash.        Medication List        Accurate as of Jun 03, 2021 11:37 AM. If you have any questions, ask your nurse or doctor.          Accu-Chek Guide w/Device Kit Test BS TID Dx E11.9   Accu-Chek Softclix Lancets lancets Test BS TID Dx E11.9   Fish Oil 1200 MG Cpdr Take 1,200 mg by mouth  daily.   fluticasone 50 MCG/ACT nasal spray Commonly known as: FLONASE USE 1 SPRAY IN EACH NOSTRIL TWICE DAILY AS NEEDED FOR ALLERGIES OR RHINITIS (SUBSTITUTED FOR FLONASE)   glipiZIDE 5 MG 24 hr tablet Commonly known as: GLUCOTROL XL Take 1 tablet (5 mg total) by mouth daily with breakfast.   glucose blood test strip Test BS TID Dx E11.9   ketoconazole 2 % cream Commonly known as: NIZORAL Apply 1 application topically daily.   lisinopril 20  MG tablet Commonly known as: ZESTRIL Take 1 tablet (20 mg total) by mouth daily.   metFORMIN 1000 MG tablet Commonly known as: GLUCOPHAGE Take 0.5 tablets (500 mg total) by mouth 2 (two) times daily with a meal. TAKE 1 TABLET TWICE DAILY WITH A MEAL   metoCLOPramide 5 MG tablet Commonly known as: REGLAN Take 1 tablet (5 mg total) by mouth 3 (three) times daily before meals.   omeprazole 40 MG capsule Commonly known as: PRILOSEC TAKE 1 CAPSULE EVERY DAY   Oxycodone HCl 10 MG Tabs Take 1 tablet (10 mg total) by mouth 4 (four) times daily as needed. What changed: Another medication with the same name was changed. Make sure you understand how and when to take each. Changed by: Fransisca Kaufmann Cyle Kenyon, MD   Oxycodone HCl 10 MG Tabs Take 1 tablet (10 mg total) by mouth 4 (four) times daily as needed. Start taking on: July 03, 2021 What changed: These instructions start on July 03, 2021. If you are unsure what to do until then, ask your doctor or other care provider. Changed by: Fransisca Kaufmann Barnabas Henriques, MD   Oxycodone HCl 10 MG Tabs Take 1 tablet (10 mg total) by mouth 4 (four) times daily as needed. Start taking on: August 02, 2021 What changed: These instructions start on August 02, 2021. If you are unsure what to do until then, ask your doctor or other care provider. Changed by: Fransisca Kaufmann Lagina Reader, MD   simvastatin 40 MG tablet Commonly known as: ZOCOR TAKE 1 TABLET EVERY DAY AT 6 PM   triamcinolone cream 0.1 % Commonly known as: KENALOG Apply 1 application topically 2 (two) times daily.   Trulicity 4.5 KJ/1.7HX Sopn Generic drug: Dulaglutide Inject 4.5 mg as directed once a week.         Objective:   BP 140/88   Pulse 74   Temp 98 F (36.7 C)   Ht _0  (1.854 m)   Wt 258 lb (117 kg)   SpO2 97%   BMI 34.04 kg/m   Wt Readings from Last 3 Encounters:  06/03/21 258 lb (117 kg)  04/07/21 254 lb (115.2 kg)  04/06/21 254 lb 9.6 oz (115.5 kg)    Physical Exam Vitals and  nursing note reviewed.  Constitutional:      General: He is not in acute distress.    Appearance: He is well-developed. He is not diaphoretic.  Eyes:     General: No scleral icterus.    Conjunctiva/sclera: Conjunctivae normal.  Neck:     Thyroid: No thyromegaly.  Cardiovascular:     Rate and Rhythm: Normal rate and regular rhythm.     Heart sounds: Normal heart sounds. No murmur heard. Pulmonary:     Effort: Pulmonary effort is normal. No respiratory distress.     Breath sounds: Normal breath sounds. No wheezing.  Musculoskeletal:        General: Normal range of motion.     Cervical back: Neck supple.  Lymphadenopathy:  Cervical: No cervical adenopathy.  Skin:    General: Skin is warm and dry.     Findings: No rash.  Neurological:     Mental Status: He is alert and oriented to person, place, and time.     Coordination: Coordination normal.  Psychiatric:        Behavior: Behavior normal.      Assessment & Plan:   Problem List Items Addressed This Visit       Cardiovascular and Mediastinum   Hypertension associated with diabetes ([place])     Endocrine   Diabetes mellitus, type 2 ([place]) - Primary   Relevant Orders   Bayer DCA Hb A1c Waived     Other   Chronic foot pain   Relevant Medications   Oxycodone HCl 10 MG TABS   Oxycodone HCl 10 MG TABS (Start on 08/02/2021)   Oxycodone HCl 10 MG TABS (Start on 07/03/2021)   Mixed hyperlipidemia     looks good at 6.6, blood pressure looks good, refill pain medication, see back in 3 months Follow up plan: Return in about 3 months (around 09/03/2021), or if symptoms worsen or fail to improve, for Diabetes and hypertension.  Counseling provided for all of the vaccine components Orders Placed This Encounter  Procedures   Bayer [place] Hb A1c [place] Augustus Zurawski, MD [facility] 06/03/2021, 11:37 AM

## 2021-06-30 ENCOUNTER — Encounter: Payer: Self-pay | Admitting: Orthopaedic Surgery

## 2021-06-30 ENCOUNTER — Ambulatory Visit: Payer: Medicare HMO | Admitting: Orthopaedic Surgery

## 2021-06-30 DIAGNOSIS — M7711 Lateral epicondylitis, right elbow: Secondary | ICD-10-CM

## 2021-06-30 DIAGNOSIS — M65331 Trigger finger, right middle finger: Secondary | ICD-10-CM

## 2021-06-30 DIAGNOSIS — M720 Palmar fascial fibromatosis [Dupuytren]: Secondary | ICD-10-CM

## 2021-06-30 MED ORDER — METHYLPREDNISOLONE ACETATE 40 MG/ML IJ SUSP
20.0000 mg | INTRAMUSCULAR | Status: AC | PRN
  Administered 2021-06-30: 20 mg

## 2021-06-30 MED ORDER — LIDOCAINE HCL 1 % IJ SOLN
0.5000 mL | INTRAMUSCULAR | Status: AC | PRN
  Administered 2021-06-30: .5 mL

## 2021-06-30 NOTE — Progress Notes (Signed)
Office Visit Note   Patient: Timothy Nelson           Date of Birth: [birth year]           MRN: [id number] Visit Date: 06/30/2021              Requested by: Dettinger, Elige Radon, MD [address] [place],  [place] [address] PCP: Dettinger, Elige Radon, MD   Assessment & Plan: Visit Diagnoses:  1. Lateral epicondylitis, right elbow   2. Trigger middle finger of right hand   3. Dupuytren's contracture of both hands     Plan: Plan he will monitor his glucose levels closely after the injection today.  He did tolerate the trigger finger injection well.  In regards to the elbow we will send him to formal physical therapy where they will work on home exercise program for his lateral epicondylitis tissue massage and modalities to the elbow.  He will follow-up with Dr. Frazier Butt for the Dupuytren's contractures in both hands.  Questions were encouraged and answered.  Follow-Up Instructions: Return if symptoms worsen or fail to improve.   Orders:  Orders Placed This Encounter  Procedures   Hand/UE Inj   No orders of the defined types were placed in this encounter.     Procedures: Hand/UE Inj: R long A1 for trigger finger on 06/30/2021 9:04 AM Details: volar approach Medications: 0.5 mL lidocaine 1 %; 20 mg methylPREDNISolone acetate 40 MG/ML      Clinical Data: No additional findings.   Subjective: Chief Complaint  Patient presents with   Right Elbow - Pain   Right Middle Finger - Pain    HPI Timothy Nelson comes in today with right elbow pain that is been ongoing for couple months.  He was treated by his primary care provider with a injection for lateral epicondylitis on 03/27/2021 it helped however his pains came back despite using the elbow brace.  He was throwing a ball recently and had a flareup.  He is now having constant pain in the lateral aspect of the right elbow no numbness tingling down the arm.  He also notes that he is having right long finger triggering whenever he makes a fist.   He has had no injury to the hand.  He also notes nodules in both hands and is wondering what these may be.  He is having no real pain from these nodules.  He is diabetic last hemoglobin A1c was 6.6. Review of Systems See HPI  Objective: Vital Signs: There were no vitals taken for this visit.  Physical Exam Constitutional:      Appearance: He is not ill-appearing or diaphoretic.  Pulmonary:     Effort: Pulmonary effort is normal.  Neurological:     Mental Status: He is alert and oriented to person, place, and time.     Ortho Exam Right elbow with full range of motion.  No abnormal warmth erythema or signs of infection.  No ecchymosis about the elbow.  Tenderness over the lateral epicondyle region only.  Provocative maneuvers cause pain lateral elbow. Right hand active triggering long finger.  Palpable nodule minimal tenderness at the A1 pulley.  No other triggering fingers throughout the hand. Bilateral hands with early Dupuytren's contractures. Specialty Comments:  No specialty comments available.  Imaging: No results found.   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 mass index (BMI) of 35.0 to 35.9 in adult 01/19/2017   Snoring 01/19/2017   Excessive daytime sleepiness 01/19/2017   GERD (gastroesophageal reflux disease) 01/09/2015   Hypertension associated with diabetes (HCC) 09/11/2014   Chronic foot pain 09/11/2014   Past Medical History:  Diagnosis Date   Arthritis    Barrett's esophagus    Chronic pain    Diabetes (HCC)    Essential hypertension    Gastroparesis    GERD (gastroesophageal reflux disease)    Hyperlipidemia    Obesity    Type 2 diabetes mellitus (HCC)     Family History  Problem Relation Age of Onset    Diabetes Mother    Hypertension Mother    Hyperlipidemia Mother    Arthritis Mother    Esophageal cancer Mother        patient unsure   Diabetes Father    Heart disease Father        Age 24   Kidney disease Father    Hypertension Sister    Diabetes Daughter    Colon cancer Neg Hx    Stomach cancer Neg Hx    Pancreatic cancer Neg Hx    Liver disease Neg Hx     Past Surgical History:  Procedure Laterality Date   ESOPHAGOGASTRODUODENOSCOPY (EGD) WITH PROPOFOL N/A 04/04/2015   Procedure: ESOPHAGOGASTRODUODENOSCOPY (EGD) WITH PROPOFOL;  Surgeon: Malissa Hippo, MD;  [facility];  Service: Endoscopy;  Laterality: N/A;  8:30   FRACTURE SURGERY  March 2007   Left arm, left thumb, bilateral legs   HIP SURGERY Right    WRIST FRACTURE SURGERY Left    Social History   Occupational History   Occupation: Disability  Tobacco Use   Smoking status: Some Days    Packs/day: 1.00    Years: 20.00    Total pack years: 20.00    Types: Cigars, Cigarettes   Smokeless tobacco: Never   Tobacco comments:    Smoked intermittently for 20 years; 1-2 cigars weekly as of 02/07/2018  Vaping Use   Vaping Use: Never used  Substance and Sexual Activity   Alcohol use: Yes    Alcohol/week: 1.0 standard drink of alcohol    Types: 1 Glasses of wine per week    Comment: glass wine a week.    Drug use: No   Sexual activity: Yes

## 2021-07-06 ENCOUNTER — Ambulatory Visit: Payer: Medicare HMO | Attending: Physician Assistant

## 2021-07-06 ENCOUNTER — Other Ambulatory Visit: Payer: Self-pay

## 2021-07-06 DIAGNOSIS — M25521 Pain in right elbow: Secondary | ICD-10-CM | POA: Insufficient documentation

## 2021-07-06 NOTE — Therapy (Signed)
OUTPATIENT PHYSICAL THERAPY SHOULDER EVALUATION   Patient Name: Timothy Nelson MRN: [id number] DOB:1965/05/24, 56 y.o., male Today's Date: 07/06/2021   PT End of Session - 07/06/21 0807     Visit Number 1    Number of Visits 8    Date for PT Re-Evaluation 08/28/21    PT Start Time 0817    PT Stop Time 0855    PT Time Calculation (min) 38 min    Activity Tolerance Patient tolerated treatment well    Behavior During Therapy [facility] for tasks assessed/performed             Past Medical History:  Diagnosis Date   Arthritis    Barrett's esophagus    Chronic pain    Diabetes ([place])    Essential hypertension    Gastroparesis    GERD (gastroesophageal reflux disease)    Hyperlipidemia    Obesity    Type 2 diabetes mellitus ([place])    Past Surgical History:  Procedure Laterality Date   ESOPHAGOGASTRODUODENOSCOPY (EGD) WITH PROPOFOL N/A 04/04/2015   Procedure: ESOPHAGOGASTRODUODENOSCOPY (EGD) WITH PROPOFOL;  Surgeon: Rogene Houston, MD;  [facility];  Service: Endoscopy;  Laterality: N/A;  8:30   FRACTURE SURGERY  March 2007   Left arm, left thumb, bilateral legs   HIP SURGERY Right    WRIST FRACTURE SURGERY Left    Patient Active Problem List   Diagnosis Date Noted   Mixed hyperlipidemia 10/03/2018   Personal history of diabetic foot ulcer 09/30/2017   Neuropathy 08/05/2017   Diabetes mellitus, type 2 ([place]) 08/05/2017   Class 2 drug-induced obesity with serious comorbidity and body mass index (BMI) of 35.0 to 35.9 in adult 01/19/2017   Snoring 01/19/2017   Excessive daytime sleepiness 01/19/2017   GERD (gastroesophageal reflux disease) 01/09/2015   Hypertension associated with diabetes ([place]) 09/11/2014   Chronic foot pain 09/11/2014    PCP: Dettinger, Fransisca Kaufmann, MD   REFERRING PROVIDER: Erskine Emery, PA-C  REFERRING DIAG: Right lateral Epicondylitis   THERAPY DIAG:  Pain in right elbow  Rationale for Evaluation and Treatment Rehabilitation  ONSET DATE:  over 2 months   SUBJECTIVE:                                                                                                                                                                                      SUBJECTIVE STATEMENT: Patient reports that he was driving stakes into the ground one day. Then the next day he could hardly pick up a coffee cup. He had an injection which helped for a few hours, but then the pain returned. He notes that the pain has been stable since  the pain first began. He is right hand dominant.   PERTINENT HISTORY: He had something similar happen in his left elbow years ago  MD Follow-up: none  PAIN:  Are you having pain? Yes: NPRS scale: 6/10 Pain location: right posterior elbow Pain description: sharp, stabbing, "like I got hit with a ball pin hammer"  Aggravating factors: throwing, lifting, scrolling on his phone, yardwork Relieving factors: compression sleeve  PRECAUTIONS: None  WEIGHT BEARING RESTRICTIONS No  FALLS:  Has patient fallen in last 6 months? No  LIVING ENVIRONMENT: Lives in: House/apartment Has following equipment at home: None  OCCUPATION: Fireman  PLOF: Independent  PATIENT GOALS reduced pain and improved use of his right arm  OBJECTIVE:   PATIENT SURVEYS:  FOTO 64.02  COGNITION:  Overall cognitive status: Within functional limits for tasks assessed     SENSATION: WFL  POSTURE: WFL  UPPER EXTREMITY ROM:   Active ROM Right eval Left eval  Shoulder flexion    Shoulder extension    Shoulder abduction    Shoulder adduction    Shoulder internal rotation    Shoulder external rotation    Elbow flexion WFL   Elbow extension WFL with "pulling on elbow"   Wrist flexion WFL   Wrist extension WFL with "pulling on elbow"; pain relieved with pressure over wrist extensors   Wrist ulnar deviation    Wrist radial deviation    Wrist pronation WFL   Wrist supination WFL   (Blank rows = not tested)  UPPER EXTREMITY  MMT:  MMT Right eval Left eval  Shoulder flexion    Shoulder extension    Shoulder abduction    Shoulder adduction    Shoulder internal rotation    Shoulder external rotation    Middle trapezius    Lower trapezius    Elbow flexion 5/5 5/5  Elbow extension 5/5 5/5  Wrist flexion 5/5 5/5  Wrist extension 4-/5 with lateral elbow pain 4/5  Wrist ulnar deviation    Wrist radial deviation    Wrist pronation 5/5 5/5  Wrist supination 4+/5 with lateral elbow pain 5/5  Grip strength (lbs) 87 with "pulling' in lateral elbow 87  (Blank rows = not tested)   JOINT MOBILITY TESTING:  WFL   PALPATION:  Proximal wrist extensors causes "shooting" pain    TODAY'S TREATMENT:                                    7/3 EXERCISE LOG  Exercise Repetitions and Resistance Comments  Wrist extensor stretch  2 x 30" on RUE                     Blank cell = exercise not performed today   Manual Therapy Soft Tissue Mobilization: right lateral elbow,      PATIENT EDUCATION: Education details: Ice massage, self soft tissue mobilization Person educated: Patient Education method: Explanation Education comprehension: verbalized understanding   HOME EXERCISE PROGRAM: Ice massage, self soft tissue mobilization, and wrist extensor stretch 3-4 x 30 seconds; 1-2x per day   ASSESSMENT:  CLINICAL IMPRESSION: Patient is a 56 y.o. male who was seen today for physical therapy evaluation and treatment for right lateral epicondylitis. He presented with moderate pain severity and irritability with resisted wrist extension and supination reproducing his familiar pain. Recommend that he continues with skilled physical therapy to address his remaining impairments to return to his prior level  of function.    OBJECTIVE IMPAIRMENTS decreased activity tolerance, decreased strength, impaired UE functional use, and pain.   ACTIVITY LIMITATIONS carrying and lifting  PARTICIPATION LIMITATIONS:  occupation  PERSONAL FACTORS Profession, Time since onset of injury/illness/exacerbation, and 1-2 comorbidities: HTN and DM  are also affecting patient's functional outcome.   REHAB POTENTIAL: Good  CLINICAL DECISION MAKING: Stable/uncomplicated  EVALUATION COMPLEXITY: Low   GOALS: Goals reviewed with patient? Yes  LONG TERM GOALS: Target date: 08/03/2021  (Remove Blue Hyperlink)  Patient will be independent with his HEP. Baseline:  Goal status: INITIAL  2.  Patient will be able to complete his daily activities without his familiar pain exceeding 1/10. Baseline: 3-6/10 Goal status: INITIAL  3.  Patient will be able to demonstrate at lest 4/5 right wrist extensor strength without reproducing his familiar pain. Baseline: 4-/5 with pain  Goal status: INITIAL    PLAN: PT FREQUENCY: 1-2x/week  PT DURATION: 4 weeks  PLANNED INTERVENTIONS: Therapeutic exercises, Therapeutic activity, Neuromuscular re-education, Patient/Family education, Joint mobilization, Dry Needling, Electrical stimulation, Moist heat, Taping, Vasopneumatic device, Ultrasound, Manual therapy, and Re-evaluation  PLAN FOR NEXT SESSION: UBE, STM to wrist extensors, and modalities, as needed    Darlin Coco, PT 07/06/2021, 9:42 AM

## 2021-07-08 DIAGNOSIS — L602 Onychogryphosis: Secondary | ICD-10-CM | POA: Diagnosis not present

## 2021-07-08 DIAGNOSIS — E1142 Type 2 diabetes mellitus with diabetic polyneuropathy: Secondary | ICD-10-CM | POA: Diagnosis not present

## 2021-07-08 DIAGNOSIS — L859 Epidermal thickening, unspecified: Secondary | ICD-10-CM | POA: Diagnosis not present

## 2021-07-10 ENCOUNTER — Ambulatory Visit: Payer: Medicare HMO | Admitting: Physical Therapy

## 2021-07-10 ENCOUNTER — Encounter: Payer: Self-pay | Admitting: Physical Therapy

## 2021-07-10 DIAGNOSIS — M25521 Pain in right elbow: Secondary | ICD-10-CM

## 2021-07-10 NOTE — Therapy (Signed)
OUTPATIENT PHYSICAL THERAPY SHOULDER TREATMENT.   Patient Name: Timothy Nelson MRN: [id number] DOB:1965-02-24, 56 y.o., male Today's Date: 07/10/2021   PT End of Session - 07/10/21 0850     Visit Number 2    Number of Visits 8    Date for PT Re-Evaluation 08/28/21    PT Start Time 0815    PT Stop Time 0903    PT Time Calculation (min) 48 min    Activity Tolerance Patient tolerated treatment well    Behavior During Therapy [facility] for tasks assessed/performed             Past Medical History:  Diagnosis Date   Arthritis    Barrett's esophagus    Chronic pain    Diabetes ([place])    Essential hypertension    Gastroparesis    GERD (gastroesophageal reflux disease)    Hyperlipidemia    Obesity    Type 2 diabetes mellitus ([place])    Past Surgical History:  Procedure Laterality Date   ESOPHAGOGASTRODUODENOSCOPY (EGD) WITH PROPOFOL N/A 04/04/2015   Procedure: ESOPHAGOGASTRODUODENOSCOPY (EGD) WITH PROPOFOL;  Surgeon: Rogene Houston, MD;  [facility];  Service: Endoscopy;  Laterality: N/A;  8:30   FRACTURE SURGERY  March 2007   Left arm, left thumb, bilateral legs   HIP SURGERY Right    WRIST FRACTURE SURGERY Left    Patient Active Problem List   Diagnosis Date Noted   Mixed hyperlipidemia 10/03/2018   Personal history of diabetic foot ulcer 09/30/2017   Neuropathy 08/05/2017   Diabetes mellitus, type 2 ([place]) 08/05/2017   Class 2 drug-induced obesity with serious comorbidity and body mass index (BMI) of 35.0 to 35.9 in adult 01/19/2017   Snoring 01/19/2017   Excessive daytime sleepiness 01/19/2017   GERD (gastroesophageal reflux disease) 01/09/2015   Hypertension associated with diabetes ([place]) 09/11/2014   Chronic foot pain 09/11/2014    PCP: Dettinger, Fransisca Kaufmann, MD   REFERRING PROVIDER: Erskine Emery, PA-C  REFERRING DIAG: Right lateral Epicondylitis   THERAPY DIAG:  Pain in right elbow  Rationale for Evaluation and Treatment Rehabilitation  ONSET DATE:  over 2 months   SUBJECTIVE:                                                                                                                                                                                      SUBJECTIVE STATEMENT: Pain low at rest.  Hurts a lot with use. PERTINENT HISTORY: He had something similar happen in his left elbow years ago  MD Follow-up: none  PAIN:  Are you having pain? Yes: NPRS scale: 3/10 Pain location: right posterior elbow Pain description: sharp, stabbing, "like  I got hit with a ball pin hammer"  Aggravating factors: throwing, lifting, scrolling on his phone, yardwork Relieving factors: compression sleeve    PATIENT GOALS reduced pain and improved use of his right arm  OBJECTIVE:   PATIENT SURVEYS:  FOTO 64.02   WFL  UPPER EXTREMITY ROM:   Active ROM Right eval Left eval  Shoulder flexion    Shoulder extension    Shoulder abduction    Shoulder adduction    Shoulder internal rotation    Shoulder external rotation    Elbow flexion WFL   Elbow extension WFL with "pulling on elbow"   Wrist flexion WFL   Wrist extension WFL with "pulling on elbow"; pain relieved with pressure over wrist extensors   Wrist ulnar deviation    Wrist radial deviation    Wrist pronation WFL   Wrist supination WFL   (Blank rows = not tested)  UPPER EXTREMITY MMT:       TODAY'S TREATMENT:   Small soundhead combo e'stim/US at 1.50 W/CM2, 3.3 mHZ at 20% x 12 minutes to patient's right lateral epicondylar region f/b STW/M x 11 minutes including ischemic release technique f/b Pre-mod e'stim at 80-150 Hz x 15 minutes.       ASSESSMENT:  CLINICAL IMPRESSION:  Excellent response to treatment today.  Patient had a notable trigger point over his proximal right wrist extensors that responded well to STW/M      GOALS: Goals reviewed with patient? Yes  LONG TERM GOALS: Target date: 08/03/2021  (Remove Blue Hyperlink)  Patient will be independent with his  HEP. Baseline:  Goal status: INITIAL  2.  Patient will be able to complete his daily activities without his familiar pain exceeding 1/10. Baseline: 3-6/10 Goal status: INITIAL  3.  Patient will be able to demonstrate at lest 4/5 right wrist extensor strength without reproducing his familiar pain. Baseline: 4-/5 with pain  Goal status: INITIAL    PLAN: PT FREQUENCY: 1-2x/week  PT DURATION: 4 weeks  PLANNED INTERVENTIONS: Therapeutic exercises, Therapeutic activity, Neuromuscular re-education, Patient/Family education, Joint mobilization, Dry Needling, Electrical stimulation, Moist heat, Taping, Vasopneumatic device, Ultrasound, Manual therapy, and Re-evaluation  PLAN FOR NEXT SESSION: UBE, STM to wrist extensors, and modalities, as needed    Agustina Witzke, [place], PT 07/10/2021, 9:07 AM

## 2021-07-13 ENCOUNTER — Ambulatory Visit: Payer: Medicare HMO

## 2021-07-13 DIAGNOSIS — M25521 Pain in right elbow: Secondary | ICD-10-CM

## 2021-07-13 NOTE — Therapy (Addendum)
OUTPATIENT PHYSICAL THERAPY SHOULDER TREATMENT.   Patient Name: Timothy Nelson MRN: [id number] DOB:[birth year], 56 y.o., male Today's Date: 07/13/2021   PT End of Session - 07/13/21 0811     Visit Number 3    Number of Visits 8    Date for PT Re-Evaluation 08/28/21    PT Start Time 0815    PT Stop Time 0856    PT Time Calculation (min) 41 min    Activity Tolerance Patient tolerated treatment well    Behavior During Therapy [facility] for tasks assessed/performed              Past Medical History:  Diagnosis Date   Arthritis    Barrett's esophagus    Chronic pain    Diabetes ([place])    Essential hypertension    Gastroparesis    GERD (gastroesophageal reflux disease)    Hyperlipidemia    Obesity    Type 2 diabetes mellitus ([place])    Past Surgical History:  Procedure Laterality Date   ESOPHAGOGASTRODUODENOSCOPY (EGD) WITH PROPOFOL N/A 04/04/2015   Procedure: ESOPHAGOGASTRODUODENOSCOPY (EGD) WITH PROPOFOL;  Surgeon: Rogene Houston, MD;  [facility];  Service: Endoscopy;  Laterality: N/A;  8:30   FRACTURE SURGERY  March 2007   Left arm, left thumb, bilateral legs   HIP SURGERY Right    WRIST FRACTURE SURGERY Left    Patient Active Problem List   Diagnosis Date Noted   Mixed hyperlipidemia 10/03/2018   Personal history of diabetic foot ulcer 09/30/2017   Neuropathy 08/05/2017   Diabetes mellitus, type 2 ([place]) 08/05/2017   Class 2 drug-induced obesity with serious comorbidity and body mass index (BMI) of 35.0 to 35.9 in adult 01/19/2017   Snoring 01/19/2017   Excessive daytime sleepiness 01/19/2017   GERD (gastroesophageal reflux disease) 01/09/2015   Hypertension associated with diabetes ([place]) 09/11/2014   Chronic foot pain 09/11/2014    PCP: Dettinger, Fransisca Kaufmann, MD   REFERRING PROVIDER: Erskine Emery, PA-C  REFERRING DIAG: Right lateral Epicondylitis   THERAPY DIAG:  Pain in right elbow  Rationale for Evaluation and Treatment Rehabilitation  ONSET  DATE: over 2 months   SUBJECTIVE:                                                                                                                                                                                      SUBJECTIVE STATEMENT: Patient reports that his elbow still hurts today.  PERTINENT HISTORY: He had something similar happen in his left elbow years ago  MD Follow-up: none  PAIN:  Are you having pain? Yes: NPRS scale: 3/10 Pain location: right posterior elbow Pain description: sharp, stabbing, "like  I got hit with a ball pin hammer"  Aggravating factors: throwing, lifting, scrolling on his phone, yardwork Relieving factors: compression sleeve    PATIENT GOALS reduced pain and improved use of his right arm  OBJECTIVE: performed at initial evaluation   PATIENT SURVEYS:  FOTO 64.02   WFL  UPPER EXTREMITY ROM:   Active ROM Right eval Left eval  Shoulder flexion    Shoulder extension    Shoulder abduction    Shoulder adduction    Shoulder internal rotation    Shoulder external rotation    Elbow flexion WFL   Elbow extension WFL with "pulling on elbow"   Wrist flexion WFL   Wrist extension WFL with "pulling on elbow"; pain relieved with pressure over wrist extensors   Wrist ulnar deviation    Wrist radial deviation    Wrist pronation WFL   Wrist supination WFL   (Blank rows = not tested)  UPPER EXTREMITY MMT:       TODAY'S TREATMENT:                                   7/10 EXERCISE LOG  Exercise Repetitions and Resistance Comments  UBE 120 RPM x 8 minutes    [place]abar twisting  Red t-bar x 2 minutes    Triceps kickback 30 reps x blue XTS    Weighted pronation/supination RUE; 5 lbs x 20 reps    Wrist extensor stretch RUE; 3 x 30 seconds    Wrist flexion and extension RUE; 30 reps each @ 7 lbs    Therabar bending  RUE overhead; green t-bar x 2 minutes     Blank cell = exercise not performed today  Manual Therapy Soft Tissue Mobilization: right  wrist extensors, unable to provide significant symptoms relief   ASSESSMENT:  CLINICAL IMPRESSION:   Treatment was initiated with manual therapy for reduced pain and improved soft tissue extensibility. However, this was unable to provide significant relief to his familiar symptoms. This was followed by appropriately matched interventions to facilitate wrist flexor and extensor engagement. He reported no significant pain or discomfort with any of today's interventions. He reported that his still felt some discomfort in his elbow upon the conclusion of treatment. He continues to require skilled physical therapy to address his remaining impairments to return to his prior level of function.   PHYSICAL THERAPY DISCHARGE SUMMARY  Visits from Start of Care: 3  Current functional level related to goals / functional outcomes: Patient's goals were unable to assessed as he did not return following his last appointment.    Remaining deficits: Pain and strength   Education / Equipment: HEP   Patient agrees to discharge. Patient goals were not met. Patient is being discharged due to not returning since the last visit.     GOALS: Goals reviewed with patient? Yes  LONG TERM GOALS: Target date: 08/03/2021  (Remove Blue Hyperlink)  Patient will be independent with his HEP. Baseline:  Goal status: INITIAL  2.  Patient will be able to complete his daily activities without his familiar pain exceeding 1/10. Baseline: 3-6/10 Goal status: INITIAL  3.  Patient will be able to demonstrate at lest 4/5 right wrist extensor strength without reproducing his familiar pain. Baseline: 4-/5 with pain  Goal status: INITIAL    PLAN: PT FREQUENCY: 1-2x/week  PT DURATION: 4 weeks  PLANNED INTERVENTIONS: Therapeutic exercises, Therapeutic activity, Neuromuscular re-education, Patient/Family education, Joint mobilization,  Dry Needling, Electrical stimulation, Moist heat, Taping, Vasopneumatic device,  Ultrasound, Manual therapy, and Re-evaluation  PLAN FOR NEXT SESSION: UBE, STM to wrist extensors, and modalities, as needed    Darlin Coco, PT 07/13/2021, 8:59 AM

## 2021-07-14 ENCOUNTER — Ambulatory Visit: Payer: Medicare HMO | Admitting: Orthopedic Surgery

## 2021-07-14 DIAGNOSIS — M720 Palmar fascial fibromatosis [Dupuytren]: Secondary | ICD-10-CM | POA: Diagnosis not present

## 2021-07-14 NOTE — Progress Notes (Signed)
Office Visit Note   Patient: Timothy Nelson           Date of Birth: 04-14-1965           MRN: [id number] Visit Date: 07/14/2021              Requested by: Dettinger, Fransisca Kaufmann, MD [place],  [place] [address] PCP: Dettinger, Fransisca Kaufmann, MD   Assessment & Plan: Visit Diagnoses:  1. Dupuytren's disease of palm with nodules without contracture     Plan: Patient has Dupuytren's nodules involving both palms without contracture.  This is not painful for him.  Not bothersome to him.  We discussed the nature of Dupuytren's disease as well as his diagnosis, prognosis, and both observation and treatment options.  We discussed the utility of the tabletop test.  His nodules are asymptomatic and not interfering with his function.  We will continue to monitor them for now.  He can see me back again as needed.  Follow-Up Instructions: No follow-ups on file.   Orders:  No orders of the defined types were placed in this encounter.  No orders of the defined types were placed in this encounter.     Procedures: No procedures performed   Clinical Data: No additional findings.   Subjective: Chief Complaint  Patient presents with   Right Hand - Follow-up   Left Hand - Follow-up    This is a 56 year old right-hand-dominant male who presents with bilateral hand nodules.  The right was first noticed around 5 years ago in the left wrist about a year and a half ago after his carpal tunnel release.  He has no family history of Dupuytren's disease.  The nodules are in the palm in line with the ring finger.  There is no associated cord.  He has no contractures of any of his fingers.  The nodules are asymptomatic and not interfering with his hand function.    Review of Systems   Objective: Vital Signs: There were no vitals taken for this visit.  Physical Exam Constitutional:      Appearance: Normal appearance.  Cardiovascular:     Rate and Rhythm: Normal rate.     Pulses: Normal  pulses.  Pulmonary:     Effort: Pulmonary effort is normal.  Skin:    General: Skin is warm and dry.     Capillary Refill: Capillary refill takes less than 2 seconds.  Neurological:     Mental Status: He is alert.     Right Hand Exam   Tenderness  The patient is experiencing no tenderness.   Other  Erythema: absent Sensation: normal Pulse: present  Comments:  Palpable nodules in line with ring finger.  No palpable cord.  No contractures.  Able to lay hand flat on table.  Full ROM of all fingers.    Left Hand Exam   Tenderness  The patient is experiencing no tenderness.   Other  Erythema: absent Sensation: normal Pulse: present  Comments:  Palpable nodules in line with ring finger.  No palpable cord.  No contractures.  Able to lay hand flat on table.  Full ROM of all fingers.       Specialty Comments:  No specialty comments available.  Imaging: No results found.   PMFS History: Patient Active Problem List   Diagnosis Date Noted   Dupuytren's disease of palm with nodules without contracture 07/14/2021   Mixed hyperlipidemia 10/03/2018   Personal history of diabetic foot ulcer 09/30/2017  Neuropathy 08/05/2017   Diabetes mellitus, type 2 ([place]) 08/05/2017   Class 2 drug-induced obesity with serious comorbidity and body mass index (BMI) of 35.0 to 35.9 in adult 01/19/2017   Snoring 01/19/2017   Excessive daytime sleepiness 01/19/2017   GERD (gastroesophageal reflux disease) 01/09/2015   Hypertension associated with diabetes ([place]) 09/11/2014   Chronic foot pain 09/11/2014   Past Medical History:  Diagnosis Date   Arthritis    Barrett's esophagus    Chronic pain    Diabetes ([place])    Essential hypertension    Gastroparesis    GERD (gastroesophageal reflux disease)    Hyperlipidemia    Obesity    Type 2 diabetes mellitus ([place])     Family History  Problem Relation Age of Onset   Diabetes Mother    Hypertension Mother    Hyperlipidemia Mother     Arthritis Mother    Esophageal cancer Mother        patient unsure   Diabetes Father    Heart disease Father        Age 77   Kidney disease Father    Hypertension Sister    Diabetes Daughter    Colon cancer Neg Hx    Stomach cancer Neg Hx    Pancreatic cancer Neg Hx    Liver disease Neg Hx     Past Surgical History:  Procedure Laterality Date   ESOPHAGOGASTRODUODENOSCOPY (EGD) WITH PROPOFOL N/A 04/04/2015   Procedure: ESOPHAGOGASTRODUODENOSCOPY (EGD) WITH PROPOFOL;  Surgeon: Rogene Houston, MD;  [facility];  Service: Endoscopy;  Laterality: N/A;  8:30   FRACTURE SURGERY  March 2007   Left arm, left thumb, bilateral legs   HIP SURGERY Right    WRIST FRACTURE SURGERY Left    Social History   Occupational History   Occupation: Disability  Tobacco Use   Smoking status: Some Days    Packs/day: 1.00    Years: 20.00    Total pack years: 20.00    Types: Cigars, Cigarettes   Smokeless tobacco: Never   Tobacco comments:    Smoked intermittently for 20 years; 1-2 cigars weekly as of 02/07/2018  Vaping Use   Vaping Use: Never used  Substance and Sexual Activity   Alcohol use: Yes    Alcohol/week: 1.0 standard drink of alcohol    Types: 1 Glasses of wine per week    Comment: glass wine a week.    Drug use: No   Sexual activity: Yes

## 2021-08-26 ENCOUNTER — Other Ambulatory Visit: Payer: Self-pay | Admitting: Family Medicine

## 2021-09-03 ENCOUNTER — Encounter: Payer: Self-pay | Admitting: Family Medicine

## 2021-09-03 ENCOUNTER — Ambulatory Visit (INDEPENDENT_AMBULATORY_CARE_PROVIDER_SITE_OTHER): Payer: Medicare HMO | Admitting: Family Medicine

## 2021-09-03 VITALS — BP 126/82 | HR 88 | Temp 98.0°F | Ht 73.0 in | Wt 257.0 lb

## 2021-09-03 DIAGNOSIS — G8929 Other chronic pain: Secondary | ICD-10-CM

## 2021-09-03 DIAGNOSIS — M79671 Pain in right foot: Secondary | ICD-10-CM | POA: Diagnosis not present

## 2021-09-03 DIAGNOSIS — E1169 Type 2 diabetes mellitus with other specified complication: Secondary | ICD-10-CM | POA: Diagnosis not present

## 2021-09-03 DIAGNOSIS — E782 Mixed hyperlipidemia: Secondary | ICD-10-CM

## 2021-09-03 DIAGNOSIS — I152 Hypertension secondary to endocrine disorders: Secondary | ICD-10-CM

## 2021-09-03 DIAGNOSIS — E1159 Type 2 diabetes mellitus with other circulatory complications: Secondary | ICD-10-CM

## 2021-09-03 DIAGNOSIS — E1165 Type 2 diabetes mellitus with hyperglycemia: Secondary | ICD-10-CM

## 2021-09-03 LAB — BAYER DCA HB A1C WAIVED: HB A1C (BAYER DCA - WAIVED): 6.7 % — ABNORMAL HIGH (ref 4.8–5.6)

## 2021-09-03 MED ORDER — OXYCODONE HCL 10 MG PO TABS: 10.0000 mg | ORAL_TABLET | Freq: Four times a day (QID) | ORAL | 0 refills | Status: DC | PRN

## 2021-09-03 MED ORDER — FLUTICASONE PROPIONATE 50 MCG/ACT NA SUSP: NASAL | 3 refills | Status: DC

## 2021-09-03 MED ORDER — ACCU-CHEK SOFTCLIX LANCETS MISC: 3 refills | Status: DC

## 2021-09-03 MED ORDER — OMEPRAZOLE 40 MG PO CPDR: 40.0000 mg | DELAYED_RELEASE_CAPSULE | Freq: Every day | ORAL | 3 refills | Status: DC

## 2021-09-03 MED ORDER — GLUCOSE BLOOD VI STRP: ORAL_STRIP | 3 refills | Status: DC

## 2021-09-03 NOTE — Progress Notes (Signed)
BP 126/82   Pulse 88   Temp 98 F (36.7 C)   Ht 6' 1"  (1.854 m)   Wt 257 lb (116.6 kg)   SpO2 97%   BMI 33.91 kg/m    Subjective:   Patient ID: Timothy [place], male    DOB: Sep 02, 1965, 56 y.o.   MRN: [id number]  HPI: Timothy [place] is a 56 y.o. male presenting on 09/03/2021 for Medical Management of Chronic Issues and Diabetes   HPI Type 2 diabetes mellitus Patient comes in today for recheck of his diabetes. Patient has been currently taking glipizide and metformin and Trulicity. Patient is currently on an ACE inhibitor/ARB. Patient has not seen an ophthalmologist this year. Patient denies any issues with their feet. The symptom started onset as an adult hypertension and hyperlipidemia ARE RELATED TO DM   Hypertension Patient is currently on lisinopril, and their blood pressure today is 126/82. Patient denies any lightheadedness or dizziness. Patient denies headaches, blurred vision, chest pains, shortness of breath, or weakness. Denies any side effects from medication and is content with current medication.   Hyperlipidemia Patient is coming in for recheck of his hyperlipidemia. The patient is currently taking fish oils and simvastatin. They deny any issues with myalgias or history of liver damage from it. They deny any focal numbness or weakness or chest pain.   Pain assessment: Cause of pain-bilateral foot and ankle pain due to trauma and deformity of the ankles and the history Pain location-both ankles and feet Pain on scale of 1-10- 6 Frequency-Daily What increases pain-walking or being on his feet What makes pain Better-rest and medication Effects on ADL -minimal Any change in general medical condition-none  Current opioids rx-oxycodone 10 mg 4 times daily as needed # meds rx-120 Effectiveness of current meds-works Adverse reactions from pain meds-none Morphine equivalent-60  Pill count performed-No Last drug screen -03/07/2020 ( high risk q99m moderate risk [id number]  risk yearly ) Urine drug screen today- No [place]yes  If yes were their any concerning findings? -None   Overdose risk: 140 Pain contract signed on: 03/07/2020  Relevant past medical, surgical, family and social history reviewed and updated as indicated. Interim medical history since our last visit reviewed. Allergies and medications reviewed and updated.  Review of Systems  Constitutional:  Negative for chills and fever.  Eyes:  Negative for visual disturbance.  Respiratory:  Negative for shortness of breath and wheezing.   Cardiovascular:  Negative for chest pain and leg swelling.  Musculoskeletal:  Positive for arthralgias, gait problem and myalgias. Negative for back pain.  Skin:  Negative for rash.  All other systems reviewed and are negative.   Per HPI unless specifically indicated above   Allergies as of 09/03/2021       Reactions   Fenofibrate Rash   Permament rash.        Medication List        Accurate as of September 03, 2021  8:26 AM. If you have any questions, ask your nurse or doctor.          Accu-Chek Guide w/Device Kit Test BS TID Dx E11.9   Accu-Chek Softclix Lancets lancets Test BS TID Dx E11.9   Fish Oil 1200 MG Cpdr Take 1,200 mg by mouth daily.   fluticasone 50 MCG/ACT nasal spray Commonly known as: FLONASE USE 1 SPRAY IN EACH NOSTRIL TWICE DAILY AS NEEDED FOR ALLERGIES OR RHINITIS (SUBSTITUTED FOR FLONASE)   glipiZIDE 5 MG 24 hr tablet Commonly  known as: GLUCOTROL XL Take 1 tablet (5 mg total) by mouth daily with breakfast.   glucose blood test strip Test BS TID Dx E11.9   ketoconazole 2 % cream Commonly known as: NIZORAL Apply 1 application topically daily.   lisinopril 20 MG tablet Commonly known as: ZESTRIL Take 1 tablet (20 mg total) by mouth daily.   metFORMIN 1000 MG tablet Commonly known as: GLUCOPHAGE Take 0.5 tablets (500 mg total) by mouth 2 (two) times daily with a meal. TAKE 1 TABLET TWICE DAILY  WITH A MEAL   metoCLOPramide 5 MG tablet Commonly known as: REGLAN Take 1 tablet (5 mg total) by mouth 3 (three) times daily before meals.   omeprazole 40 MG capsule Commonly known as: PRILOSEC Take 1 capsule (40 mg total) by mouth daily.   Oxycodone HCl 10 MG Tabs Take 1 tablet (10 mg total) by mouth 4 (four) times daily as needed. What changed: Another medication with the same name was changed. Make sure you understand how and when to take each. Changed by: Fransisca Kaufmann Arlis Yale, MD   Oxycodone HCl 10 MG Tabs Take 1 tablet (10 mg total) by mouth 4 (four) times daily as needed. Start taking on: October 03, 2021 What changed: These instructions start on October 03, 2021. If you are unsure what to do until then, ask your doctor or other care provider. Changed by: Fransisca Kaufmann Gal Smolinski, MD   Oxycodone HCl 10 MG Tabs Take 1 tablet (10 mg total) by mouth 4 (four) times daily as needed. Start taking on: November 02, 2021 What changed: These instructions start on November 02, 2021. If you are unsure what to do until then, ask your doctor or other care provider. Changed by: Fransisca Kaufmann Tuan Tippin, MD   simvastatin 40 MG tablet Commonly known as: ZOCOR TAKE 1 TABLET EVERY DAY AT 6 PM   triamcinolone cream 0.1 % Commonly known as: KENALOG Apply 1 application topically 2 (two) times daily.   Trulicity 4.5 WN/4.6EV Sopn Generic drug: Dulaglutide Inject 4.5 mg as directed once a week.         Objective:   BP 126/82   Pulse 88   Temp 98 F (36.7 C)   Ht 6' 1"  (1.854 m)   Wt 257 lb (116.6 kg)   SpO2 97%   BMI 33.91 kg/m   Wt Readings from Last 3 Encounters:  09/03/21 257 lb (116.6 kg)  06/03/21 258 lb (117 kg)  04/07/21 254 lb (115.2 kg)    Physical Exam Vitals and nursing note reviewed.  Constitutional:      General: He is not in acute distress.    Appearance: He is well-developed. He is not diaphoretic.  Eyes:     General: No scleral icterus.    Conjunctiva/sclera:  Conjunctivae normal.  Neck:     Thyroid: No thyromegaly.  Cardiovascular:     Rate and Rhythm: Normal rate and regular rhythm.     Heart sounds: Normal heart sounds. No murmur heard. Pulmonary:     Effort: Pulmonary effort is normal. No respiratory distress.     Breath sounds: Normal breath sounds. No wheezing.  Musculoskeletal:        General: No swelling. Normal range of motion.     Cervical back: Neck supple.  Lymphadenopathy:     Cervical: No cervical adenopathy.  Skin:    General: Skin is warm and dry.     Findings: No rash.  Neurological:     Mental Status: He is alert  and oriented to person, place, and time.     Coordination: Coordination normal.  Psychiatric:        Behavior: Behavior normal.       Assessment & Plan:   Problem List Items Addressed This Visit       Cardiovascular and Mediastinum   Hypertension associated with diabetes ([place])   Relevant Orders   CBC with Differential/Platelet   CMP14+EGFR   Lipid panel   Bayer DCA Hb A1c Waived     Endocrine   Diabetes mellitus, type 2 ([place]) - Primary   Relevant Medications   Accu-Chek Softclix Lancets lancets   glucose blood test strip   Other Relevant Orders   CBC with Differential/Platelet   CMP14+EGFR   Lipid panel   Bayer DCA Hb A1c Waived     Other   Chronic foot pain   Relevant Medications   Oxycodone HCl 10 MG TABS   Oxycodone HCl 10 MG TABS (Start on 11/02/2021)   Oxycodone HCl 10 MG TABS (Start on 10/03/2021)   Mixed hyperlipidemia   Relevant Orders   CBC with Differential/Platelet   CMP14+EGFR   Lipid panel   Bayer DCA Hb A1c Waived  A1c 6.7, still controlled, focus on diet, continue current medicine.  No changes  Follow up plan: Return in about 3 months (around 12/03/2021), or if symptoms worsen or fail to improve, for Chronic pain and diabetes recheck.  Counseling provided for all of the vaccine components Orders Placed This Encounter  Procedures   CBC with Differential/Platelet    CMP14+EGFR   Lipid panel   Bayer DCA Hb A1c Staples, MD [facility] 09/03/2021, 8:26 AM

## 2021-09-03 NOTE — Addendum Note (Signed)
Addended by: Alphonzo Dublin on: 09/03/2021 08:33 AM   Modules accepted: Orders

## 2021-09-04 LAB — CMP14+EGFR
ALT: 37 IU/L (ref 0–44)
AST: 27 IU/L (ref 0–40)
Albumin/Globulin Ratio: 1.6 (ref 1.2–2.2)
Albumin: 4.4 g/dL (ref 3.8–4.9)
Alkaline Phosphatase: 110 IU/L (ref 44–121)
BUN/Creatinine Ratio: 11 (ref 9–20)
BUN: 12 mg/dL (ref 6–24)
Bilirubin Total: <0.2 mg/dL (ref 0.0–1.2)
CO2: 20 mmol/L (ref 20–29)
Calcium: 9.8 mg/dL (ref 8.7–10.2)
Chloride: 103 mmol/L (ref 96–106)
Creatinine, Ser: 1.1 mg/dL (ref 0.76–1.27)
Globulin, Total: 2.7 g/dL (ref 1.5–4.5)
Glucose: 162 mg/dL — ABNORMAL HIGH (ref 70–99)
Potassium: 5.4 mmol/L — ABNORMAL HIGH (ref 3.5–5.2)
Sodium: 140 mmol/L (ref 134–144)
Total Protein: 7.1 g/dL (ref 6.0–8.5)
eGFR: 79 mL/min/{1.73_m2} (ref >59)

## 2021-09-04 LAB — CBC WITH DIFFERENTIAL/PLATELET
Basophils Absolute: 0.1 10*3/uL (ref 0.0–0.2)
Basos: 1 %
EOS (ABSOLUTE): 0.2 10*3/uL (ref 0.0–0.4)
Eos: 2 %
Hematocrit: 48.8 % (ref 37.5–51.0)
Hemoglobin: 16.9 g/dL (ref 13.0–17.7)
Immature Grans (Abs): 0 10*3/uL (ref 0.0–0.1)
Immature Granulocytes: 0 %
Lymphocytes Absolute: 1.9 10*3/uL (ref 0.7–3.1)
Lymphs: 19 %
MCH: 32.1 pg (ref 26.6–33.0)
MCHC: 34.6 g/dL (ref 31.5–35.7)
MCV: 93 fL (ref 79–97)
Monocytes Absolute: 0.9 10*3/uL (ref 0.1–0.9)
Monocytes: 9 %
Neutrophils Absolute: 6.8 10*3/uL (ref 1.4–7.0)
Neutrophils: 69 %
Platelets: 257 10*3/uL (ref 150–450)
RBC: 5.26 x10E6/uL (ref 4.14–5.80)
RDW: 12.2 % (ref 11.6–15.4)
WBC: 9.8 10*3/uL (ref 3.4–10.8)

## 2021-09-04 LAB — LIPID PANEL
Chol/HDL Ratio: 4.7 ratio (ref 0.0–5.0)
Cholesterol, Total: 154 mg/dL (ref 100–199)
HDL: 33 mg/dL — ABNORMAL LOW (ref >39)
LDL Chol Calc (NIH): 65 mg/dL (ref 0–99)
Triglycerides: 360 mg/dL — ABNORMAL HIGH (ref 0–149)
VLDL Cholesterol Cal: 56 mg/dL — ABNORMAL HIGH (ref 5–40)

## 2021-09-23 ENCOUNTER — Encounter: Payer: Self-pay | Admitting: Nurse Practitioner

## 2021-09-23 ENCOUNTER — Ambulatory Visit: Payer: Medicare HMO | Admitting: Nurse Practitioner

## 2021-09-23 VITALS — BP 124/78 | HR 84 | Ht 73.0 in | Wt 256.2 lb

## 2021-09-23 DIAGNOSIS — E1169 Type 2 diabetes mellitus with other specified complication: Secondary | ICD-10-CM

## 2021-09-23 DIAGNOSIS — E782 Mixed hyperlipidemia: Secondary | ICD-10-CM

## 2021-09-23 DIAGNOSIS — I10 Essential (primary) hypertension: Secondary | ICD-10-CM

## 2021-09-23 MED ORDER — TRULICITY 4.5 MG/0.5ML ~~LOC~~ SOAJ
4.5000 mg | SUBCUTANEOUS | 3 refills | Status: DC
Start: 2021-09-23 — End: 2022-06-25

## 2021-09-23 NOTE — Progress Notes (Signed)
09/23/2021, 8:57 AM   Endocrinology follow-up note   Subjective:    Patient ID: Timothy Nelson, male    DOB: 08/03/1965.  Timothy Nelson is being seen in follow-up after he was seen in consultation for management of currently uncontrolled symptomatic diabetes requested by  Dettinger, Fransisca Kaufmann, MD.   Past Medical History:  Diagnosis Date   Arthritis    Barrett's esophagus    Chronic pain    Diabetes ([place])    Essential hypertension    Gastroparesis    GERD (gastroesophageal reflux disease)    Hyperlipidemia    Obesity    Type 2 diabetes mellitus ([place])     Past Surgical History:  Procedure Laterality Date   ESOPHAGOGASTRODUODENOSCOPY (EGD) WITH PROPOFOL N/A 04/04/2015   Procedure: ESOPHAGOGASTRODUODENOSCOPY (EGD) WITH PROPOFOL;  Surgeon: Rogene Houston, MD;  Location: [facility];  Service: Endoscopy;  Laterality: N/A;  8:30   FRACTURE SURGERY  March 2007   Left arm, left thumb, bilateral legs   HIP SURGERY Right    WRIST FRACTURE SURGERY Left     Social History   Socioeconomic History   Marital status: Married    Spouse name: Not on file   Number of children: 1   Years of education: Not on file   Highest education level: Associate degree: occupational, Hotel manager, or vocational program  Occupational History   Occupation: Disability  Tobacco Use   Smoking status: Some Days    Packs/day: 1.00    Years: 20.00    Total pack years: 20.00    Types: Cigars, Cigarettes   Smokeless tobacco: Never   Tobacco comments:    Smoked intermittently for 20 years; 1-2 cigars weekly as of 02/07/2018  Vaping Use   Vaping Use: Never used  Substance and Sexual Activity   Alcohol use: Yes    Alcohol/week: 1.0 standard drink of alcohol    Types: 1 Glasses of wine per week    Comment: glass wine a week.    Drug use: No   Sexual activity: Yes  Other Topics Concern   Not on file  Social History Narrative   Not on file    Social Determinants of Health   Financial Resource Strain: Low Risk  (04/07/2021)   Overall Financial Resource Strain (CARDIA)    Difficulty of Paying Living Expenses: Not hard at all  Food Insecurity: No Food Insecurity (04/07/2021)   Hunger Vital Sign    Worried About Running Out of Food in the Last Year: Never true    Sellers in the Last Year: Never true  Transportation Needs: No Transportation Needs (04/07/2021)   PRAPARE - Hydrologist (Medical): No    Lack of Transportation (Non-Medical): No  Physical Activity: Inactive (04/07/2021)   Exercise Vital Sign    Days of Exercise per Week: 0 days    Minutes of Exercise per Session: 0 min  Stress: No Stress Concern Present (04/07/2021)   [place]    Feeling of Stress : Not at all  Social Connections: [place] (04/07/2021)   Social Connection and Isolation Panel [NHANES]    Frequency of  Communication with Friends and Family: More than three times a week    Frequency of Social Gatherings with Friends and Family: More than three times a week    Attends Religious Services: More than 4 times per year    Active Member of [employer] or Organizations: Yes    Attends Music therapist: More than 4 times per year    Marital Status: Married    Family History  Problem Relation Age of Onset   Diabetes Mother    Hypertension Mother    Hyperlipidemia Mother    Arthritis Mother    Esophageal cancer Mother        patient unsure   Diabetes Father    Heart disease Father        Age 3   Kidney disease Father    Hypertension Sister    Diabetes Daughter    Colon cancer Neg Hx    Stomach cancer Neg Hx    Pancreatic cancer Neg Hx    Liver disease Neg Hx     Outpatient Encounter Medications as of 09/23/2021  Medication Sig   Accu-Chek Softclix Lancets lancets Test BS TID Dx E11.9   Blood Glucose Monitoring Suppl (ACCU-CHEK  GUIDE) w/Device KIT Test BS TID Dx E11.9   fluticasone (FLONASE) 50 MCG/ACT nasal spray USE 1 SPRAY IN EACH NOSTRIL TWICE DAILY AS NEEDED FOR ALLERGIES OR RHINITIS (SUBSTITUTED FOR FLONASE)   glipiZIDE (GLUCOTROL XL) 5 MG 24 hr tablet Take 1 tablet (5 mg total) by mouth daily with breakfast.   glucose blood test strip Test BS TID Dx E11.9   ketoconazole (NIZORAL) 2 % cream Apply 1 application topically daily.   lisinopril (ZESTRIL) 20 MG tablet Take 1 tablet (20 mg total) by mouth daily.   metFORMIN (GLUCOPHAGE) 500 MG tablet Take 500 mg by mouth 2 (two) times daily with a meal.   metoCLOPramide (REGLAN) 5 MG tablet Take 1 tablet (5 mg total) by mouth 3 (three) times daily before meals.   Omega-3 Fatty Acids (FISH OIL) 1200 MG CPDR Take 1,200 mg by mouth daily.   omeprazole (PRILOSEC) 40 MG capsule Take 1 capsule (40 mg total) by mouth daily.   Oxycodone HCl 10 MG TABS Take 1 tablet (10 mg total) by mouth 4 (four) times daily as needed.   [START ON 11/02/2021] Oxycodone HCl 10 MG TABS Take 1 tablet (10 mg total) by mouth 4 (four) times daily as needed.   [START ON 10/03/2021] Oxycodone HCl 10 MG TABS Take 1 tablet (10 mg total) by mouth 4 (four) times daily as needed.   simvastatin (ZOCOR) 40 MG tablet TAKE 1 TABLET EVERY DAY AT 6 PM   triamcinolone cream (KENALOG) 0.1 % Apply 1 application topically 2 (two) times daily.   [DISCONTINUED] Dulaglutide (TRULICITY) 4.5 HF/2.9MS SOPN Inject 4.5 mg as directed once a week.   Dulaglutide (TRULICITY) 4.5 XJ/1.5ZM SOPN Inject 4.5 mg as directed once a week.   No facility-administered encounter medications on file as of 09/23/2021.    ALLERGIES: Allergies  Allergen Reactions   Fenofibrate Rash    Permament rash.     VACCINATION STATUS: Immunization History  Administered Date(s) Administered   Influenza,inj,Quad PF,6+ Mos 11/15/2018   Influenza-Unspecified 09/04/2016   Moderna Covid-19 Vaccine Bivalent Booster 46yr & up 02/14/2021   Moderna  Sars-Covid-2 Vaccination 03/13/2019, 04/10/2019, 11/06/2019, 06/19/2020   Pneumococcal Polysaccharide-23 09/16/2016   Tdap 04/05/2011   Zoster Recombinat (Shingrix) 09/03/2020, 03/04/2021    Diabetes He presents for his follow-up  diabetic visit. He has type 2 diabetes mellitus. Onset time: He was diagnosed at approximate age of 3 years. His disease course has been stable. There are no hypoglycemic associated symptoms. Pertinent negatives for hypoglycemia include no confusion, headaches, pallor or seizures. Pertinent negatives for diabetes include no chest pain, no fatigue, no polydipsia, no polyphagia, no polyuria and no weakness. There are no hypoglycemic complications. Symptoms are stable. Diabetic complications include nephropathy (mild). Risk factors for coronary artery disease include diabetes mellitus, dyslipidemia, family history, obesity, male sex, hypertension, sedentary lifestyle and tobacco exposure. Current diabetic treatment includes oral agent (dual therapy) (and Trulicity). He is compliant with treatment most of the time. His weight is fluctuating minimally. He is following a generally unhealthy diet. When asked about meal planning, he reported none. He has not had a previous visit with a dietitian. He rarely participates in exercise. (He presents today with no logs or meter to review.  His previsit A1c was 6.7%, essentially unchanged.  He denies any hypoglycemia, says he gets symptoms when he gets as low as 100.  ) An ACE inhibitor/angiotensin II receptor blocker is being taken. He does not see a podiatrist.Eye exam is current.  Hyperlipidemia This is a chronic problem. The current episode started more than 1 year ago. The problem is controlled. Recent lipid tests were reviewed and are normal. Exacerbating diseases include chronic renal disease, diabetes and obesity. Factors aggravating his hyperlipidemia include fatty foods. Pertinent negatives include no chest pain, myalgias or shortness  of breath. Current antihyperlipidemic treatment includes statins. The current treatment provides moderate improvement of lipids. Compliance problems include adherence to diet and adherence to exercise.  Risk factors for coronary artery disease include dyslipidemia, diabetes mellitus, family history, hypertension, male sex, obesity and a sedentary lifestyle.  Hypertension This is a chronic problem. The current episode started more than 1 year ago. The problem has been resolved since onset. The problem is controlled. Pertinent negatives include no chest pain, headaches, neck pain, palpitations or shortness of breath. There are no associated agents to hypertension. Risk factors for coronary artery disease include diabetes mellitus, dyslipidemia, male gender, obesity, sedentary lifestyle, family history and smoking/tobacco exposure. Past treatments include ACE inhibitors. The current treatment provides moderate improvement. There are no compliance problems.  Hypertensive end-organ damage includes kidney disease. Identifiable causes of hypertension include chronic renal disease.    Review of systems  Constitutional: + Minimally fluctuating body weight,  current Body mass index is 33.8 kg/m. , no fatigue, no subjective hyperthermia, no subjective hypothermia Eyes: no blurry vision, no xerophthalmia ENT: no sore throat, no nodules palpated in throat, no dysphagia/odynophagia, no hoarseness Cardiovascular: no chest pain, no shortness of breath, no palpitations, no leg swelling Respiratory: no cough, no shortness of breath Gastrointestinal: no nausea/vomiting/diarrhea Musculoskeletal: no muscle/joint aches Skin: no rashes, no hyperemia Neurological: no tremors, no numbness, no tingling, no dizziness Psychiatric: no depression, no anxiety   Objective:    BP 124/78 (BP Location: Left Arm, Patient Position: Sitting, Cuff Size: Large)   Pulse 84   Ht 6' 1"  (1.854 m)   Wt 256 lb 3.2 oz (116.2 kg)   BMI  33.80 kg/m   Wt Readings from Last 3 Encounters:  09/23/21 256 lb 3.2 oz (116.2 kg)  09/03/21 257 lb (116.6 kg)  06/03/21 258 lb (117 kg)    BP Readings from Last 3 Encounters:  09/23/21 124/78  09/03/21 126/82  06/03/21 140/88     Physical Exam- Limited  Constitutional:  Body mass index  is 33.8 kg/m. , not in acute distress, normal state of mind Eyes:  EOMI, no exophthalmos Neck: Supple Cardiovascular: RRR, no murmurs, rubs, or gallops, no edema Respiratory: Adequate breathing efforts, no crackles, rales, rhonchi, or wheezing Musculoskeletal: no gross deformities, strength intact in all four extremities, no gross restriction of joint movements Skin:  no rashes, no hyperemia Neurological: no tremor with outstretched hands   Diabetic Foot Exam - Simple   Simple Foot Form Diabetic Foot exam was performed with the following findings: Yes 09/23/2021  8:43 AM  Visual Inspection See comments: Yes Sensation Testing See comments: Yes Pulse Check Posterior Tibialis and Dorsalis pulse intact bilaterally: Yes Comments Onychomycosis bilaterally; no sensation to monofilament tool bilaterally     CMP ( most recent) CMP     Component Value Date/Time   NA 140 09/03/2021 0801   K 5.4 (H) 09/03/2021 0801   CL 103 09/03/2021 0801   CO2 20 09/03/2021 0801   GLUCOSE 162 (H) 09/03/2021 0801   GLUCOSE 208 (H) 04/03/2015 1500   BUN 12 09/03/2021 0801   CREATININE 1.10 09/03/2021 0801   CALCIUM 9.8 09/03/2021 0801   PROT 7.1 09/03/2021 0801   ALBUMIN 4.4 09/03/2021 0801   AST 27 09/03/2021 0801   ALT 37 09/03/2021 0801   ALKPHOS 110 09/03/2021 0801   BILITOT <0.2 09/03/2021 0801   GFRNONAA 66 01/18/2020 0842   GFRAA 76 01/18/2020 0842   Diabetic Labs (most recent): Lab Results  Component Value Date   HGBA1C 6.7 (H) 09/03/2021   HGBA1C 6.6 (H) 06/03/2021   HGBA1C 6.6 (H) 03/04/2021   MICROALBUR 80 03/23/2021   MICROALBUR 11.7 05/08/2019     Lipid Panel ( most  recent) Lipid Panel     Component Value Date/Time   CHOL 154 09/03/2021 0801   TRIG 360 (H) 09/03/2021 0801   HDL 33 (L) 09/03/2021 0801   CHOLHDL 4.7 09/03/2021 0801   LDLCALC 65 09/03/2021 0801   LABVLDL 56 (H) 09/03/2021 0801     Lab Results  Component Value Date   TSH 4.170 09/03/2020   TSH 2.010 05/08/2019   TSH 2.13 02/07/2018   TSH 3.510 09/16/2016   TSH 1.270 09/11/2014   FREET4 1.38 09/03/2020   FREET4 1.48 05/08/2019     Assessment & Plan:   1) Controlled type 2 diabetes mellitus without complication  - Timothy Nelson has currently uncontrolled symptomatic type 2 DM since 56 years of age.  He presents today with no logs or meter to review.  His previsit A1c was 6.7%, essentially unchanged.  He denies any hypoglycemia, says he gets symptoms when he gets as low as 100.    -Recent labs reviewed.  POCT UM shows mild microalbuminuria at 80 today.  - I had a long discussion with him about the progressive nature of diabetes and the pathology behind its complications. -his diabetes is complicated by obesity/sedentary life, history of smoking and he remains at a high risk for more acute and chronic complications which include CAD, CVA, CKD, retinopathy, and neuropathy. These are all discussed in detail with him.  - Nutritional counseling repeated at each appointment due to patients tendency to fall back in to old habits.  - The patient admits there is a room for improvement in their diet and drink choices. -  Suggestion is made for the patient to avoid simple carbohydrates from their diet including Cakes, Sweet Desserts / Pastries, Ice Cream, Soda (diet and regular), Sweet Tea, Candies, Chips, Cookies, Sweet Pastries, Store  Bought Juices, Alcohol in Excess of 1-2 drinks a day, Artificial Sweeteners, Coffee Creamer, and "Sugar-free" Products. This will help patient to have stable blood glucose profile and potentially avoid unintended weight gain.   - I encouraged the patient to  switch to unprocessed or minimally processed complex starch and increased protein intake (animal or plant source), fruits, and vegetables.   - Patient is advised to stick to a routine mealtimes to eat 3 meals a day and avoid unnecessary snacks (to snack only to correct hypoglycemia).  - he has seen Jearld Fenton, RDN, CDE for diabetes education.  - I have approached him with the following individualized plan to manage his diabetes and patient agrees:   -He is advised to continue his current medications including Trulicity 4.5 mg SQ weekly, Metformin 500 mg po twice daily with meals and Glipizide 5 mg XL daily with breakfast.   He is encouraged to continue monitoring blood glucose at least once daily, before breakfast and notify the clinic if glucose levels are less than 70 or greater than 200 for 3 tests in a row.  - Specific targets for  A1c;  LDL, HDL, Triglycerides, were discussed with the patient.  2) Blood Pressure /Hypertension:  His blood pressure is controlled to target.  He is advised to continue Lisinopril 20 mg po daily.    3) Lipids/Hyperlipidemia:  His recent lipid panel on 09/03/21 shows controlled LDL at 65 and elevated triglycerides of 360.  He is advised to continue Simvastatin 40 mg po daily at bedtime and Fish Oil 1200 mg po daily. Side effects and precautions discussed with him. He is advised to avoid fried foods and butter.    4)  Weight/Diet: His Body mass index is 33.8 kg/m.--  clearly complicating his diabetes care.  He is a candidate for modest weight loss.  I discussed with him the fact that loss of 5 - 10% of his  current body weight will have the most impact on his diabetes management.  Exercise, and detailed carbohydrates information provided  -  detailed on discharge instructions.  5) Chronic Care/Health Maintenance: -he is on ACEI/ARB and Statin medications and is encouraged to initiate and continue to follow up with Ophthalmology, Dentist,  Podiatrist at least  yearly or according to recommendations, and advised to stay away from smoking.  He is safe he does not believe in flu shots,  I have recommended yearly flu vaccine and pneumonia vaccine at least every 5 years; moderate intensity exercise for up to 150 minutes weekly; and  sleep for at least 7 hours a day.  - he is advised to maintain close follow up with Dettinger, Fransisca Kaufmann, MD for primary care needs, as well as his other providers for optimal and coordinated care.     I spent 30 minutes in the care of the patient today including review of labs from [facility], Lipids, Thyroid Function, Hematology (current and previous including abstractions from other facilities); face-to-face time discussing  his blood glucose readings/logs, discussing hypoglycemia and hyperglycemia episodes and symptoms, medications doses, his options of short and long term treatment based on the latest standards of care / guidelines;  discussion about incorporating lifestyle medicine;  and documenting the encounter. Risk reduction counseling performed per USPSTF guidelines to reduce obesity and cardiovascular risk factors.     Please refer to Patient Instructions for Blood Glucose Monitoring and Insulin/Medications Dosing Guide"  in media tab for additional information. Please  also refer to " Patient Self Inventory" in the  Media  tab for reviewed elements of pertinent patient history.  Timothy Nelson participated in the discussions, expressed understanding, and voiced agreement with the above plans.  All questions were answered to his satisfaction. he is encouraged to contact clinic should he have any questions or concerns prior to his return visit.    Follow up plan: - Return in about 6 months (around 03/24/2022) for Diabetes F/U with A1c in office, No previsit labs, Bring meter and logs.  Rayetta Pigg, [facility] [address] [place], [place] [address] Phone: [phone] Fax:  [phone]  09/23/2021, 8:57 AM

## 2021-09-29 DIAGNOSIS — H524 Presbyopia: Secondary | ICD-10-CM | POA: Diagnosis not present

## 2021-09-29 DIAGNOSIS — H2513 Age-related nuclear cataract, bilateral: Secondary | ICD-10-CM | POA: Diagnosis not present

## 2021-09-29 DIAGNOSIS — D3131 Benign neoplasm of right choroid: Secondary | ICD-10-CM | POA: Diagnosis not present

## 2021-09-29 DIAGNOSIS — H35463 Secondary vitreoretinal degeneration, bilateral: Secondary | ICD-10-CM | POA: Diagnosis not present

## 2021-09-29 DIAGNOSIS — Q141 Congenital malformation of retina: Secondary | ICD-10-CM | POA: Diagnosis not present

## 2021-09-29 DIAGNOSIS — H5213 Myopia, bilateral: Secondary | ICD-10-CM | POA: Diagnosis not present

## 2021-09-29 DIAGNOSIS — E119 Type 2 diabetes mellitus without complications: Secondary | ICD-10-CM | POA: Diagnosis not present

## 2021-10-12 ENCOUNTER — Other Ambulatory Visit: Payer: Self-pay | Admitting: Family Medicine

## 2021-10-12 DIAGNOSIS — E1165 Type 2 diabetes mellitus with hyperglycemia: Secondary | ICD-10-CM

## 2021-11-16 ENCOUNTER — Encounter (INDEPENDENT_AMBULATORY_CARE_PROVIDER_SITE_OTHER): Payer: Self-pay | Admitting: Gastroenterology

## 2021-12-03 ENCOUNTER — Other Ambulatory Visit: Payer: Self-pay | Admitting: Family Medicine

## 2021-12-03 DIAGNOSIS — E1169 Type 2 diabetes mellitus with other specified complication: Secondary | ICD-10-CM

## 2021-12-04 ENCOUNTER — Encounter: Payer: Self-pay | Admitting: Family Medicine

## 2021-12-04 ENCOUNTER — Ambulatory Visit (INDEPENDENT_AMBULATORY_CARE_PROVIDER_SITE_OTHER): Payer: Medicare HMO | Admitting: Family Medicine

## 2021-12-04 VITALS — Ht 73.0 in

## 2021-12-04 DIAGNOSIS — G8929 Other chronic pain: Secondary | ICD-10-CM

## 2021-12-04 DIAGNOSIS — G629 Polyneuropathy, unspecified: Secondary | ICD-10-CM

## 2021-12-04 DIAGNOSIS — E1169 Type 2 diabetes mellitus with other specified complication: Secondary | ICD-10-CM | POA: Diagnosis not present

## 2021-12-04 DIAGNOSIS — M79671 Pain in right foot: Secondary | ICD-10-CM | POA: Diagnosis not present

## 2021-12-04 DIAGNOSIS — E1159 Type 2 diabetes mellitus with other circulatory complications: Secondary | ICD-10-CM

## 2021-12-04 DIAGNOSIS — I152 Hypertension secondary to endocrine disorders: Secondary | ICD-10-CM | POA: Diagnosis not present

## 2021-12-04 DIAGNOSIS — Z23 Encounter for immunization: Secondary | ICD-10-CM

## 2021-12-04 DIAGNOSIS — E782 Mixed hyperlipidemia: Secondary | ICD-10-CM | POA: Diagnosis not present

## 2021-12-04 LAB — BAYER DCA HB A1C WAIVED: HB A1C (BAYER DCA - WAIVED): 7.1 % — ABNORMAL HIGH (ref 4.8–5.6)

## 2021-12-04 MED ORDER — OXYCODONE HCL 10 MG PO TABS: 10.0000 mg | ORAL_TABLET | Freq: Four times a day (QID) | ORAL | 0 refills | Status: DC | PRN

## 2021-12-04 MED ORDER — LISINOPRIL 20 MG PO TABS: 20.0000 mg | ORAL_TABLET | Freq: Every day | ORAL | 3 refills | Status: DC

## 2021-12-04 MED ORDER — SIMVASTATIN 40 MG PO TABS: 40.0000 mg | ORAL_TABLET | Freq: Every day | ORAL | 3 refills | Status: DC

## 2021-12-04 MED ORDER — METOCLOPRAMIDE HCL 5 MG PO TABS: 5.0000 mg | ORAL_TABLET | Freq: Three times a day (TID) | ORAL | 3 refills | Status: DC

## 2021-12-04 MED ORDER — METFORMIN HCL 500 MG PO TABS: 500.0000 mg | ORAL_TABLET | Freq: Two times a day (BID) | ORAL | 3 refills | Status: DC

## 2021-12-04 NOTE — Progress Notes (Signed)
Ht _0  (1.854 m)   BMI 33.80 kg/m    Subjective:   Patient ID: Timothy Nelson, male    DOB: 1965/03/14, 56 y.o.   MRN: [id number]  HPI: [name] is a 56 y.o. male presenting on 12/04/2021 for Medical Management of Chronic Issues and Diabetes   HPI Pain assessment: Cause of pain-ankle deformity due to history of trauma Pain location-bilateral ankles Pain on scale of 1-10- 5 Frequency-daily What increases pain-being on his feet What makes pain Better-medication and rest Effects on ADL -limit some on walking but he keeps pretty active Any change in general medical condition-none  Current opioids rx-oxycodone 10 mg 4 times daily as needed # meds rx-120/month Effectiveness of current meds-works well Adverse reactions from pain meds-none Morphine equivalent-60  Pill count performed-No Last drug screen -03/04/2021 ( high risk q70m moderate risk q653mlow risk yearly ) Urine drug screen today- No [place]  If yes were their any concerning findings? -None   Overdose risk: 120 Pain contract signed on: 03/04/2021  Type 2 diabetes mellitus Patient comes in today for recheck of his diabetes. Patient has been currently taking Trulicity and glipizide and metformin. Patient is currently on an ACE inhibitor/ARB. Patient has not seen an ophthalmologist this year. Patient stepped on a nail recently in the center of his right foot.  He says it is healing up fine but wants his tetanus shot up-to-date.  He did this a few weeks ago.  He denies any signs of infection or fevers or chills.. The symptom started onset as an adult hypertension and hyperlipidemia and neuropathy, no sensation in right foot ARE RELATED TO DM   Hypertension Patient is currently on lisinopril, and their blood pressure today is 122/80. Patient denies any lightheadedness or dizziness. Patient denies headaches, blurred vision, chest pains, shortness of breath, or weakness. Denies any side effects from  medication and is content with current medication.   Hyperlipidemia Patient is coming in for recheck of his hyperlipidemia. The patient is currently taking simvastatin and fish oils. They deny any issues with myalgias or history of liver damage from it. They deny any focal numbness or weakness or chest pain.   Relevant past medical, surgical, family and social history reviewed and updated as indicated. Interim medical history since our last visit reviewed. Allergies and medications reviewed and updated.  Review of Systems  Constitutional:  Negative for chills and fever.  Eyes:  Negative for visual disturbance.  Respiratory:  Negative for shortness of breath and wheezing.   Cardiovascular:  Negative for chest pain and leg swelling.  Musculoskeletal:  Negative for back pain and gait problem.  Skin:  Negative for rash.  Neurological:  Negative for dizziness, weakness and light-headedness.  All other systems reviewed and are negative.   Per HPI unless specifically indicated above   Allergies as of 12/04/2021       Reactions   Fenofibrate Rash   Permament rash.        Medication List        Accurate as of December 04, 2021  8:45 AM. If you have any questions, ask your nurse or doctor.          Accu-Chek Guide w/Device Kit Test BS TID Dx E11.9   Accu-Chek Softclix Lancets lancets Test BS TID Dx E11.9   Fish Oil 1200 MG Cpdr Take 1,200 mg by mouth daily.   fluticasone 50 MCG/ACT nasal spray Commonly known as: [facility]  NOSTRIL TWICE DAILY AS NEEDED FOR ALLERGIES OR RHINITIS (SUBSTITUTED FOR FLONASE)   glipiZIDE 5 MG 24 hr tablet Commonly known as: GLUCOTROL XL TAKE 1 TABLET EVERY DAY WITH BREAKFAST   glucose blood test strip Test BS TID Dx E11.9   ketoconazole 2 % cream Commonly known as: NIZORAL Apply 1 application topically daily.   lisinopril 20 MG tablet Commonly known as: ZESTRIL Take 1 tablet (20 mg total) by mouth daily.   metFORMIN  500 MG tablet Commonly known as: GLUCOPHAGE Take 1 tablet (500 mg total) by mouth 2 (two) times daily with a meal.   metoCLOPramide 5 MG tablet Commonly known as: REGLAN Take 1 tablet (5 mg total) by mouth 3 (three) times daily before meals. What changed: See the new instructions. Changed by: Fransisca Kaufmann Saed Hudlow, MD   omeprazole 40 MG capsule Commonly known as: PRILOSEC Take 1 capsule (40 mg total) by mouth daily.   Oxycodone HCl 10 MG Tabs Take 1 tablet (10 mg total) by mouth 4 (four) times daily as needed. What changed: Another medication with the same name was changed. Make sure you understand how and when to take each. Changed by: Fransisca Kaufmann Bryn Perkin, MD   Oxycodone HCl 10 MG Tabs Take 1 tablet (10 mg total) by mouth 4 (four) times daily as needed. Start taking on: February 03, 2022 What changed: These instructions start on February 03, 2022. If you are unsure what to do until then, ask your doctor or other care provider. Changed by: Fransisca Kaufmann Cedra Villalon, MD   Oxycodone HCl 10 MG Tabs Take 1 tablet (10 mg total) by mouth 4 (four) times daily as needed. Start taking on: March 05, 2022 What changed: These instructions start on March 05, 2022. If you are unsure what to do until then, ask your doctor or other care provider. Changed by: Fransisca Kaufmann Manya Balash, MD   simvastatin 40 MG tablet Commonly known as: ZOCOR Take 1 tablet (40 mg total) by mouth daily at 6 PM. What changed: See the new instructions. Changed by: Fransisca Kaufmann Chyrel Taha, MD   triamcinolone cream 0.1 % Commonly known as: KENALOG Apply 1 application topically 2 (two) times daily.   Trulicity 4.5 ID/7.8EU Sopn Generic drug: Dulaglutide Inject 4.5 mg as directed once a week.         Objective:   Ht _0  (1.854 m)   BMI 33.80 kg/m   Wt Readings from Last 3 Encounters:  09/23/21 256 lb 3.2 oz (116.2 kg)  09/03/21 257 lb (116.6 kg)  06/03/21 258 lb (117 kg)    Physical Exam Vitals and nursing note reviewed.   Constitutional:      General: He is not in acute distress.    Appearance: He is well-developed. He is not diaphoretic.  Eyes:     General: No scleral icterus.    Conjunctiva/sclera: Conjunctivae normal.  Neck:     Thyroid: No thyromegaly.  Cardiovascular:     Rate and Rhythm: Normal rate and regular rhythm.     Heart sounds: Normal heart sounds. No murmur heard. Pulmonary:     Effort: Pulmonary effort is normal. No respiratory distress.     Breath sounds: Normal breath sounds. No wheezing.  Musculoskeletal:        General: Normal range of motion.     Cervical back: Neck supple.  Lymphadenopathy:     Cervical: No cervical adenopathy.  Skin:    General: Skin is warm and dry.     Findings: Lesion (Healing  lesion in the bottom of his foot.  No sign of infection.  In the center of his right foot) present. No rash.  Neurological:     Mental Status: He is alert and oriented to person, place, and time.     Coordination: Coordination normal.  Psychiatric:        Behavior: Behavior normal.       Assessment & Plan:   Problem List Items Addressed This Visit       Cardiovascular and Mediastinum   Hypertension associated with diabetes ([place])   Relevant Medications   lisinopril (ZESTRIL) 20 MG tablet   metFORMIN (GLUCOPHAGE) 500 MG tablet   simvastatin (ZOCOR) 40 MG tablet   Other Relevant Orders   CBC with Differential/Platelet   CMP14+EGFR   Lipid panel   Bayer DCA Hb A1c Waived     Endocrine   Diabetes mellitus, type 2 (HCC) - Primary   Relevant Medications   lisinopril (ZESTRIL) 20 MG tablet   metFORMIN (GLUCOPHAGE) 500 MG tablet   simvastatin (ZOCOR) 40 MG tablet   Other Relevant Orders   CBC with Differential/Platelet   CMP14+EGFR   Lipid panel   Bayer DCA Hb A1c Waived     Nervous and Auditory   Neuropathy   Relevant Medications   metoCLOPramide (REGLAN) 5 MG tablet     Other   Chronic foot pain   Relevant Medications   Oxycodone HCl 10 MG TABS (Start on  03/05/2022)   Oxycodone HCl 10 MG TABS (Start on 02/03/2022)   Oxycodone HCl 10 MG TABS   metoCLOPramide (REGLAN) 5 MG tablet   Mixed hyperlipidemia   Relevant Medications   lisinopril (ZESTRIL) 20 MG tablet   simvastatin (ZOCOR) 40 MG tablet   Other Relevant Orders   CBC with Differential/Platelet   CMP14+EGFR   Lipid panel   Bayer DCA Hb A1c Waived  A1c 7.1, slightly up, focus on diet will refill pain medicines for him.  Seems to be stable on it.  He states it is not lasting as long but does not want to change to another medicine at this point.  Got tetanus shot today.  Follow up plan: Return in about 3 months (around 03/05/2022), or if symptoms worsen or fail to improve, for Diabetes and hypertension and cholesterol.  Counseling provided for all of the vaccine components Orders Placed This Encounter  Procedures   CBC with Differential/Platelet   CMP14+EGFR   Lipid panel   Bayer DCA Hb A1c Waived    Caryl Pina, MD [facility] 12/04/2021, 8:45 AM

## 2021-12-04 NOTE — Addendum Note (Signed)
Addended by: Alphonzo Dublin on: 12/04/2021 10:42 AM   Modules accepted: Orders

## 2021-12-05 LAB — LIPID PANEL
Chol/HDL Ratio: 4.3 ratio (ref 0.0–5.0)
Cholesterol, Total: 142 mg/dL (ref 100–199)
HDL: 33 mg/dL — ABNORMAL LOW (ref >39)
LDL Chol Calc (NIH): 54 mg/dL (ref 0–99)
Triglycerides: 357 mg/dL — ABNORMAL HIGH (ref 0–149)
VLDL Cholesterol Cal: 55 mg/dL — ABNORMAL HIGH (ref 5–40)

## 2021-12-05 LAB — CBC WITH DIFFERENTIAL/PLATELET
Basophils Absolute: 0.1 10*3/uL (ref 0.0–0.2)
Basos: 1 %
EOS (ABSOLUTE): 0.3 10*3/uL (ref 0.0–0.4)
Eos: 2 %
Hematocrit: 50 % (ref 37.5–51.0)
Hemoglobin: 17 g/dL (ref 13.0–17.7)
Immature Grans (Abs): 0.1 10*3/uL (ref 0.0–0.1)
Immature Granulocytes: 1 %
Lymphocytes Absolute: 2.2 10*3/uL (ref 0.7–3.1)
Lymphs: 20 %
MCH: 31.6 pg (ref 26.6–33.0)
MCHC: 34 g/dL (ref 31.5–35.7)
MCV: 93 fL (ref 79–97)
Monocytes Absolute: 0.9 10*3/uL (ref 0.1–0.9)
Monocytes: 8 %
Neutrophils Absolute: 7.7 10*3/uL — ABNORMAL HIGH (ref 1.4–7.0)
Neutrophils: 68 %
Platelets: 250 10*3/uL (ref 150–450)
RBC: 5.38 x10E6/uL (ref 4.14–5.80)
RDW: 12.2 % (ref 11.6–15.4)
WBC: 11 10*3/uL — ABNORMAL HIGH (ref 3.4–10.8)

## 2021-12-05 LAB — CMP14+EGFR
ALT: 24 IU/L (ref 0–44)
AST: 19 IU/L (ref 0–40)
Albumin/Globulin Ratio: 1.8 (ref 1.2–2.2)
Albumin: 4.4 g/dL (ref 3.8–4.9)
Alkaline Phosphatase: 111 IU/L (ref 44–121)
BUN/Creatinine Ratio: 15 (ref 9–20)
BUN: 15 mg/dL (ref 6–24)
Bilirubin Total: 0.2 mg/dL (ref 0.0–1.2)
CO2: 21 mmol/L (ref 20–29)
Calcium: 9.8 mg/dL (ref 8.7–10.2)
Chloride: 101 mmol/L (ref 96–106)
Creatinine, Ser: 0.98 mg/dL (ref 0.76–1.27)
Globulin, Total: 2.5 g/dL (ref 1.5–4.5)
Glucose: 191 mg/dL — ABNORMAL HIGH (ref 70–99)
Potassium: 5.1 mmol/L (ref 3.5–5.2)
Sodium: 140 mmol/L (ref 134–144)
Total Protein: 6.9 g/dL (ref 6.0–8.5)
eGFR: 91 mL/min/{1.73_m2} (ref >59)

## 2021-12-07 ENCOUNTER — Other Ambulatory Visit: Payer: Self-pay

## 2021-12-07 DIAGNOSIS — G8929 Other chronic pain: Secondary | ICD-10-CM

## 2021-12-07 DIAGNOSIS — E1169 Type 2 diabetes mellitus with other specified complication: Secondary | ICD-10-CM

## 2021-12-07 DIAGNOSIS — G629 Polyneuropathy, unspecified: Secondary | ICD-10-CM

## 2021-12-07 MED ORDER — SIMVASTATIN 40 MG PO TABS: 40.0000 mg | ORAL_TABLET | Freq: Every day | ORAL | 3 refills | Status: DC

## 2021-12-07 MED ORDER — METOCLOPRAMIDE HCL 5 MG PO TABS: 5.0000 mg | ORAL_TABLET | Freq: Three times a day (TID) | ORAL | 3 refills | Status: DC

## 2021-12-07 MED ORDER — METFORMIN HCL 500 MG PO TABS: 500.0000 mg | ORAL_TABLET | Freq: Two times a day (BID) | ORAL | 3 refills | Status: DC

## 2021-12-07 MED ORDER — LISINOPRIL 20 MG PO TABS: 20.0000 mg | ORAL_TABLET | Freq: Every day | ORAL | 3 refills | Status: DC

## 2022-01-06 ENCOUNTER — Telehealth: Payer: Self-pay

## 2022-01-06 ENCOUNTER — Encounter: Payer: Self-pay | Admitting: Family Medicine

## 2022-01-06 NOTE — Telephone Encounter (Signed)
Per Devina with [facility] in [place] pt needs refill of Hydrocodone. States that pt last filled on 12/07/21 and pt should be due for next refill. Per last [place] the date is for 02/03/22. [facility] believes this is a typo.   Per Dr. Warrick Parisian it was an error and that the hydrocodone is ok to refill today. Devina at [facility] made aware.

## 2022-01-12 DIAGNOSIS — L602 Onychogryphosis: Secondary | ICD-10-CM | POA: Diagnosis not present

## 2022-01-12 DIAGNOSIS — E1142 Type 2 diabetes mellitus with diabetic polyneuropathy: Secondary | ICD-10-CM | POA: Diagnosis not present

## 2022-01-23 ENCOUNTER — Other Ambulatory Visit: Payer: Self-pay | Admitting: Family Medicine

## 2022-01-25 MED ORDER — FLUTICASONE PROPIONATE 50 MCG/ACT NA SUSP: NASAL | 1 refills | Status: DC

## 2022-01-25 NOTE — Telephone Encounter (Signed)
Previous RFs were sent to local pharmacy, sent remaining refills to mail order pharmacy

## 2022-01-25 NOTE — Addendum Note (Signed)
Addended by: Antonietta Barcelona D on: 01/25/2022 03:36 PM   Modules accepted: Orders

## 2022-03-03 ENCOUNTER — Encounter: Payer: Self-pay | Admitting: Family Medicine

## 2022-03-03 ENCOUNTER — Ambulatory Visit (INDEPENDENT_AMBULATORY_CARE_PROVIDER_SITE_OTHER): Payer: Medicare HMO | Admitting: Family Medicine

## 2022-03-03 VITALS — BP 126/78 | HR 83 | Ht 73.0 in | Wt 257.0 lb

## 2022-03-03 DIAGNOSIS — G8929 Other chronic pain: Secondary | ICD-10-CM | POA: Diagnosis not present

## 2022-03-03 DIAGNOSIS — I152 Hypertension secondary to endocrine disorders: Secondary | ICD-10-CM

## 2022-03-03 DIAGNOSIS — Z79899 Other long term (current) drug therapy: Secondary | ICD-10-CM

## 2022-03-03 DIAGNOSIS — M79671 Pain in right foot: Secondary | ICD-10-CM

## 2022-03-03 DIAGNOSIS — F17210 Nicotine dependence, cigarettes, uncomplicated: Secondary | ICD-10-CM | POA: Diagnosis not present

## 2022-03-03 DIAGNOSIS — E782 Mixed hyperlipidemia: Secondary | ICD-10-CM

## 2022-03-03 DIAGNOSIS — E1159 Type 2 diabetes mellitus with other circulatory complications: Secondary | ICD-10-CM

## 2022-03-03 DIAGNOSIS — E1169 Type 2 diabetes mellitus with other specified complication: Secondary | ICD-10-CM

## 2022-03-03 LAB — BAYER DCA HB A1C WAIVED: HB A1C (BAYER DCA - WAIVED): 8.5 % — ABNORMAL HIGH (ref 4.8–5.6)

## 2022-03-03 MED ORDER — OXYCODONE HCL 10 MG PO TABS: 10.0000 mg | ORAL_TABLET | Freq: Four times a day (QID) | ORAL | 0 refills | Status: DC | PRN

## 2022-03-03 MED ORDER — IBUPROFEN 600 MG PO TABS: 600.0000 mg | ORAL_TABLET | Freq: Three times a day (TID) | ORAL | 3 refills | Status: DC | PRN

## 2022-03-03 MED ORDER — EMPAGLIFLOZIN 25 MG PO TABS: 25.0000 mg | ORAL_TABLET | Freq: Every day | ORAL | 1 refills | Status: DC

## 2022-03-03 NOTE — Progress Notes (Unsigned)
BP 126/78   Pulse 83   Ht '6\' 1"'$  (1.854 m)   Wt 116.6 kg   SpO2 96%   BMI 33.91 kg/m    Subjective:   Patient ID: Timothy Nelson, male    DOB: 1965-08-14, 57 y.o.   MRN: [id number]  HPI: Timothy Nelson is a 57 y.o. male presenting on 03/03/2022 for Medical Management of Chronic Issues and Diabetes  Type 2 Diabetes Mellitus Patient comes in today for recheck of his diabetes. Patient has been currently taking Dulaglutide, Glipizide, and Metformin. Patient is currently on an ACE inhibitor/ARB. Patient seen an ophthalmologist last October. Patient denies any new issues with their feet, but does have some sensation loss in his left foot on the dorsal aspect. Patient does have chronic loss of sensation in his right foot and swelling in bilateral lower extremities. Patient last seen a podiatrist in January 2024 and sees them every 4 months. Patient seen an ophthalmologist in October 2023 and sees them annually. Denies vision changes.   Hypertension Patient is currently on lisinopril, and their blood pressure today is 126/78. Patient denies any lightheadedness or dizziness. Patient denies headaches, blurred vision, chest pains, shortness of breath, or weakness. Denies any side effects from medication and is content with current medication.   Hyperlipidemia Patient is coming in for recheck of his hyperlipidemia. The patient is currently taking fish oils and simvastatin. They deny any issues with myalgias or history of liver damage from it. They deny any focal numbness or weakness or chest pain.   Chronic Pain Treatment Patient currently takes Oxycodone 10 mg PO QID as needed for pain control due to history of ankle and foot trauma, fractures and surgery. Patient asked if he could add on ibuprofen as needed between oxycodone doses. Patient does not currently complain of any kidney issues. Pain assessment: Cause of pain- bilateral ankle deformity Pain location- ankles and feet Pain on scale of 1-10-  8 Frequency- daily What increases pain-activity, weather changes What makes pain Better-medication and rest Effects on ADL - minimal Any change in general medical condition-none  Current opioids rx-oxycodone 10 mg 4 times daily as needed # meds rx-120/month Effectiveness of current meds-works well for the most part, is going to try adding an anti-inflammatory to help with breakthrough pain Adverse reactions from pain meds-none Morphine equivalent-60  Pill count performed-No Last drug screen -03/07/2020 ( high risk q54m moderate risk q676mlow risk yearly ) Urine drug screen today- Yes [place]- [place]  If yes were their any concerning findings? - none Pain contract signed on: today  Relevant past medical, surgical, family and social history reviewed and updated as indicated. Interim medical history since our last visit reviewed. Allergies and medications reviewed and updated.  Review of Systems  Constitutional:  Negative for chills, fatigue, fever and unexpected weight change.  HENT:  Negative for congestion.   Eyes:  Negative for visual disturbance.  Respiratory:  Negative for cough, chest tightness and shortness of breath.   Cardiovascular:  Positive for leg swelling. Negative for chest pain.  Gastrointestinal:  Negative for abdominal pain, constipation, diarrhea, nausea and vomiting.  Genitourinary:  Negative for difficulty urinating, dysuria, frequency, penile pain and urgency.  Skin:  Negative for color change and pallor.  Neurological:  Positive for numbness. Negative for dizziness, weakness, light-headedness and headaches.  Psychiatric/Behavioral:  Negative for confusion.     Per HPI unless specifically indicated above   Allergies as of 03/03/2022  Reactions   Fenofibrate Rash   Permament rash.        Medication List        Accurate as of March 03, 2022 11:56 AM. If you have any questions, ask your nurse or doctor.          STOP taking  these medications    metoCLOPramide 5 MG tablet Commonly known as: REGLAN Stopped by: Fransisca Kaufmann Dettinger, MD       TAKE these medications    Accu-Chek Guide w/Device Kit Test BS TID Dx E11.9   Accu-Chek Softclix Lancets lancets Test BS TID Dx E11.9   empagliflozin 25 MG Tabs tablet Commonly known as: Jardiance Take 1 tablet (25 mg total) by mouth daily before breakfast. Started by: Fransisca Kaufmann Dettinger, MD   Fish Oil 1200 MG Cpdr Take 1,200 mg by mouth daily.   fluticasone 50 MCG/ACT nasal spray Commonly known as: FLONASE USE 1 SPRAY IN EACH NOSTRIL TWICE DAILY AS NEEDED FOR ALLERGIES OR RHINITIS (SUBSTITUTED FOR FLONASE)   glipiZIDE 5 MG 24 hr tablet Commonly known as: GLUCOTROL XL TAKE 1 TABLET EVERY DAY WITH BREAKFAST   glucose blood test strip Test BS TID Dx E11.9   ibuprofen 600 MG tablet Commonly known as: ADVIL Take 1 tablet (600 mg total) by mouth every 8 (eight) hours as needed for mild pain or moderate pain. Started by: Fransisca Kaufmann Dettinger, MD   ketoconazole 2 % cream Commonly known as: NIZORAL Apply 1 application topically daily.   lisinopril 20 MG tablet Commonly known as: ZESTRIL Take 1 tablet (20 mg total) by mouth daily.   metFORMIN 500 MG tablet Commonly known as: GLUCOPHAGE Take 1 tablet (500 mg total) by mouth 2 (two) times daily with a meal.   omeprazole 40 MG capsule Commonly known as: PRILOSEC Take 1 capsule (40 mg total) by mouth daily.   Oxycodone HCl 10 MG Tabs Take 1 tablet (10 mg total) by mouth 4 (four) times daily as needed. What changed: Another medication with the same name was changed. Make sure you understand how and when to take each. Changed by: Fransisca Kaufmann Dettinger, MD   Oxycodone HCl 10 MG Tabs Take 1 tablet (10 mg total) by mouth 4 (four) times daily as needed. Start taking on: March 31, 2022 What changed: These instructions start on March 31, 2022. If you are unsure what to do until then, ask your doctor or other care  provider. Changed by: Fransisca Kaufmann Dettinger, MD   Oxycodone HCl 10 MG Tabs Take 1 tablet (10 mg total) by mouth 4 (four) times daily as needed. Start taking on: April 30, 2022 What changed: These instructions start on April 30, 2022. If you are unsure what to do until then, ask your doctor or other care provider. Changed by: Worthy Rancher, MD   simvastatin 40 MG tablet Commonly known as: ZOCOR Take 1 tablet (40 mg total) by mouth daily at 6 PM.   triamcinolone cream 0.1 % Commonly known as: KENALOG Apply 1 application topically 2 (two) times daily.   Trulicity 4.5 0000000 Sopn Generic drug: Dulaglutide Inject 4.5 mg as directed once a week.         Objective:   BP 126/78   Pulse 83   Ht '6\' 1"'$  (1.854 m)   Wt 116.6 kg   SpO2 96%   BMI 33.91 kg/m   Wt Readings from Last 3 Encounters:  03/03/22 116.6 kg  09/23/21 116.2 kg  09/03/21 116.6 kg  Physical Exam Constitutional:      Appearance: Normal appearance.  Eyes:     Conjunctiva/sclera: Conjunctivae normal.  Cardiovascular:     Rate and Rhythm: Normal rate and regular rhythm.     Pulses: Normal pulses.     Heart sounds: Normal heart sounds.  Pulmonary:     Effort: Pulmonary effort is normal.     Breath sounds: Normal breath sounds. No wheezing, rhonchi or rales.  Musculoskeletal:     Cervical back: Neck supple.     Right lower leg: Edema present.     Left lower leg: Edema present.  Lymphadenopathy:     Cervical: No cervical adenopathy.  Skin:    General: Skin is warm and dry.  Neurological:     Mental Status: He is alert.     Sensory: No sensory deficit (Chronic in right lower extremity. New loss of sensation in left dorsal aspect of foot.).  Psychiatric:        Mood and Affect: Mood normal.        Behavior: Behavior normal.     Diabetic Foot Exam - Simple   Simple Foot Form Diabetic Foot exam was performed with the following findings: Yes 03/03/2022 10:42 AM  Visual Inspection See comments:  Yes Sensation Testing See comments: Yes Pulse Check Posterior Tibialis and Dorsalis pulse intact bilaterally: Yes See comments: Yes Comments Patient has ankle deformities and foot deformities from history of fall and fractures.  They all look stable and no wounds currently.  Patient has no sensation on his right foot and a small area of diminished sensation on the left lateral forefoot.        Assessment & Plan:   Problem List Items Addressed This Visit       Cardiovascular and Mediastinum   Hypertension associated with diabetes ([place])   Relevant Medications   empagliflozin (JARDIANCE) 25 MG TABS tablet     Endocrine   Diabetes mellitus, type 2 (HCC) - Primary   Relevant Medications   empagliflozin (JARDIANCE) 25 MG TABS tablet   Other Relevant Orders   Bayer DCA Hb A1c Waived     Other   Chronic foot pain   Relevant Medications   Oxycodone HCl 10 MG TABS (Start on 04/30/2022)   Oxycodone HCl 10 MG TABS (Start on 03/31/2022)   Oxycodone HCl 10 MG TABS   ibuprofen (ADVIL) 600 MG tablet   Other Relevant Orders   ToxASSURE Select 13 (MW), Urine   Mixed hyperlipidemia   Other Visit Diagnoses     Controlled substance agreement signed       Relevant Orders   ToxASSURE Select 13 (MW), Urine   Smokes less than 1 pack a day with greater than 30 pack year history       Relevant Orders   CT CHEST LUNG CA SCREEN LOW DOSE W/O CM       HbA1c is 8.5 today up from 7.1 at last visit in December 2023. Patient will be started on Empagliflozin 25 mg PO qd. Patient states he refuses to start insulin. Patient requested to be taken off Metformin, which preceptor adhered to.   Patient requested to be started on ibuprofen as needed inbetween his oxycodone doses. Patient was started on Ibuprofen 600 mg PO TID as needed for pain control.  Follow up plan: Return in about 3 months (around 06/01/2022), or if symptoms worsen or fail to improve, for Diabetes and pain management.  Counseling  provided for all of the vaccine components Orders  Placed This Encounter  Procedures   CT CHEST LUNG CA SCREEN LOW DOSE W/O CM   Bayer DCA Hb A1c Waived   ToxASSURE Select 13 (MW), Urine   [place], PA-S2 [place] 03/03/2022, 12:00 PM  Patient seen and examined with PA student, agree with assessment and plan above.  Will try Jardiance and he is going to focus on diet to some extent.  Did start ibuprofen 600 for breakthrough pain as well. Caryl Pina, MD [facility] 03/03/2022, 11:56 AM

## 2022-03-07 LAB — TOXASSURE SELECT 13 (MW), URINE

## 2022-03-18 ENCOUNTER — Encounter: Payer: Self-pay | Admitting: Family Medicine

## 2022-03-19 ENCOUNTER — Telehealth: Payer: Self-pay

## 2022-03-19 ENCOUNTER — Other Ambulatory Visit: Payer: Self-pay

## 2022-03-19 DIAGNOSIS — Z1211 Encounter for screening for malignant neoplasm of colon: Secondary | ICD-10-CM

## 2022-03-19 NOTE — Telephone Encounter (Signed)
Dr. Warrick Parisian ordered Lung CT on 03/03/22. Pt states that he has not heard anything about an appt. Can you check on this please.

## 2022-03-21 ENCOUNTER — Other Ambulatory Visit: Payer: Self-pay | Admitting: Family Medicine

## 2022-03-23 NOTE — Telephone Encounter (Addendum)
Per Loma Sousa in referrals the Pulmonology office is about 50m behind in scheduling. Loma Sousa states that the pt may call their office if needed. [phone]. Pt has been made aware and understood.

## 2022-03-24 ENCOUNTER — Ambulatory Visit: Payer: Medicare HMO | Admitting: Nurse Practitioner

## 2022-03-24 ENCOUNTER — Encounter: Payer: Self-pay | Admitting: Nurse Practitioner

## 2022-03-24 VITALS — BP 119/79 | HR 77 | Ht 73.0 in | Wt 251.8 lb

## 2022-03-24 DIAGNOSIS — E1169 Type 2 diabetes mellitus with other specified complication: Secondary | ICD-10-CM

## 2022-03-24 DIAGNOSIS — I10 Essential (primary) hypertension: Secondary | ICD-10-CM | POA: Diagnosis not present

## 2022-03-24 DIAGNOSIS — E782 Mixed hyperlipidemia: Secondary | ICD-10-CM

## 2022-03-24 NOTE — Progress Notes (Signed)
03/24/2022, 9:00 AM   Endocrinology follow-up note   Subjective:    Patient ID: Timothy Nelson, male    DOB: 02/15/1965.  Timothy Nelson is being seen in follow-up after he was seen in consultation for management of currently uncontrolled symptomatic diabetes requested by  Dettinger, Fransisca Kaufmann, MD.   Past Medical History:  Diagnosis Date   Arthritis    Barrett's esophagus    Chronic pain    Diabetes ([place])    Essential hypertension    Gastroparesis    GERD (gastroesophageal reflux disease)    Hyperlipidemia    Obesity    Type 2 diabetes mellitus ([place])     Past Surgical History:  Procedure Laterality Date   ESOPHAGOGASTRODUODENOSCOPY (EGD) WITH PROPOFOL N/A 04/04/2015   Procedure: ESOPHAGOGASTRODUODENOSCOPY (EGD) WITH PROPOFOL;  Surgeon: Rogene Houston, MD;  Location: [facility];  Service: Endoscopy;  Laterality: N/A;  8:30   FRACTURE SURGERY  March 2007   Left arm, left thumb, bilateral legs   HIP SURGERY Right    WRIST FRACTURE SURGERY Left     Social History   Socioeconomic History   Marital status: Married    Spouse name: Not on file   Number of children: 1   Years of education: Not on file   Highest education level: Associate degree: occupational, Hotel manager, or vocational program  Occupational History   Occupation: Disability  Tobacco Use   Smoking status: Some Days    Packs/day: 1.00    Years: 20.00    Additional pack years: 0.00    Total pack years: 20.00    Types: Cigars, Cigarettes   Smokeless tobacco: Never   Tobacco comments:    Smoked intermittently for 20 years; 1-2 cigars weekly as of 02/07/2018  Vaping Use   Vaping Use: Never used  Substance and Sexual Activity   Alcohol use: Yes    Alcohol/week: 1.0 standard drink of alcohol    Types: 1 Glasses of wine per week    Comment: glass wine a week.    Drug use: No   Sexual activity: Yes  Other Topics Concern   Not on file  Social  History Narrative   Not on file   Social Determinants of Health   Financial Resource Strain: Low Risk  (04/07/2021)   Overall Financial Resource Strain (CARDIA)    Difficulty of Paying Living Expenses: Not hard at all  Food Insecurity: No Food Insecurity (04/07/2021)   Hunger Vital Sign    Worried About Running Out of Food in the Last Year: Never true    [place] in the Last Year: Never true  Transportation Needs: No Transportation Needs (04/07/2021)   PRAPARE - Hydrologist (Medical): No    Lack of Transportation (Non-Medical): No  Physical Activity: Inactive (04/07/2021)   Exercise Vital Sign    Days of Exercise per Week: 0 days    Minutes of Exercise per Session: 0 min  Stress: No Stress Concern Present (04/07/2021)   [place]    Feeling of Stress : Not at all  Social Connections: [place] (04/07/2021)   Social Connection and Isolation  Panel [NHANES]    Frequency of Communication with Friends and Family: More than three times a week    Frequency of Social Gatherings with Friends and Family: More than three times a week    Attends Religious Services: More than 4 times per year    Active Member of [employer] or Organizations: Yes    Attends Music therapist: More than 4 times per year    Marital Status: Married    Family History  Problem Relation Age of Onset   Diabetes Mother    Hypertension Mother    Hyperlipidemia Mother    Arthritis Mother    Esophageal cancer Mother        patient unsure   Diabetes Father    Heart disease Father        Age 61   Kidney disease Father    Hypertension Sister    Diabetes Daughter    Colon cancer Neg Hx    Stomach cancer Neg Hx    Pancreatic cancer Neg Hx    Liver disease Neg Hx     Outpatient Encounter Medications as of 03/24/2022  Medication Sig   Accu-Chek Softclix Lancets lancets Test BS TID Dx E11.9   Blood Glucose  Monitoring Suppl (ACCU-CHEK GUIDE) w/Device KIT Test BS TID Dx E11.9   Dulaglutide (TRULICITY) 4.5 0000000 SOPN Inject 4.5 mg as directed once a week.   empagliflozin (JARDIANCE) 25 MG TABS tablet Take 1 tablet (25 mg total) by mouth daily before breakfast.   fluticasone (FLONASE) 50 MCG/ACT nasal spray USE 1 SPRAY IN EACH NOSTRIL TWICE DAILY AS NEEDED FOR ALLERGIES OR RHINITIS (SUBSTITUTED FOR FLONASE)   glipiZIDE (GLUCOTROL XL) 5 MG 24 hr tablet TAKE 1 TABLET EVERY DAY WITH BREAKFAST   glucose blood test strip Test BS TID Dx E11.9   ibuprofen (ADVIL) 600 MG tablet Take 1 tablet (600 mg total) by mouth every 8 (eight) hours as needed for mild pain or moderate pain.   ketoconazole (NIZORAL) 2 % cream Apply 1 application topically daily.   lisinopril (ZESTRIL) 20 MG tablet Take 1 tablet (20 mg total) by mouth daily.   Omega-3 Fatty Acids (FISH OIL) 1200 MG CPDR Take 1,200 mg by mouth daily.   omeprazole (PRILOSEC) 40 MG capsule TAKE 1 CAPSULE EVERY DAY   [START ON 04/30/2022] Oxycodone HCl 10 MG TABS Take 1 tablet (10 mg total) by mouth 4 (four) times daily as needed.   [START ON 03/31/2022] Oxycodone HCl 10 MG TABS Take 1 tablet (10 mg total) by mouth 4 (four) times daily as needed.   Oxycodone HCl 10 MG TABS Take 1 tablet (10 mg total) by mouth 4 (four) times daily as needed.   simvastatin (ZOCOR) 40 MG tablet Take 1 tablet (40 mg total) by mouth daily at 6 PM.   triamcinolone cream (KENALOG) 0.1 % Apply 1 application topically 2 (two) times daily.   [DISCONTINUED] metFORMIN (GLUCOPHAGE) 500 MG tablet Take 1 tablet (500 mg total) by mouth 2 (two) times daily with a meal. (Patient not taking: Reported on 03/24/2022)   No facility-administered encounter medications on file as of 03/24/2022.    ALLERGIES: Allergies  Allergen Reactions   Fenofibrate Rash    Permament rash.     VACCINATION STATUS: Immunization History  Administered Date(s) Administered   Influenza,inj,Quad PF,6+ Mos  11/15/2018   Influenza-Unspecified 09/04/2016   Moderna Covid-19 Vaccine Bivalent Booster 8yrs & up 02/14/2021   Moderna Sars-Covid-2 Vaccination 03/13/2019, 04/10/2019, 11/06/2019, 06/19/2020   Pneumococcal  Polysaccharide-23 09/16/2016   Tdap 04/05/2011, 12/04/2021   Zoster Recombinat (Shingrix) 09/03/2020, 03/04/2021    Diabetes He presents for his follow-up diabetic visit. He has type 2 diabetes mellitus. Onset time: He was diagnosed at approximate age of 69 years. His disease course has been worsening. There are no hypoglycemic associated symptoms. Pertinent negatives for hypoglycemia include no confusion, headaches, pallor or seizures. Associated symptoms include weight loss. Pertinent negatives for diabetes include no chest pain, no fatigue, no polydipsia, no polyphagia, no polyuria and no weakness. There are no hypoglycemic complications. Symptoms are stable. Diabetic complications include nephropathy (mild). Risk factors for coronary artery disease include diabetes mellitus, dyslipidemia, family history, obesity, male sex, hypertension, sedentary lifestyle and tobacco exposure. Current diabetic treatment includes oral agent (dual therapy) (and Trulicity). He is compliant with treatment most of the time. His weight is decreasing steadily. He is following a generally unhealthy diet. When asked about meal planning, he reported none. He has not had a previous visit with a dietitian. He rarely participates in exercise. (He presents today with no logs or meter to review, he does not monitor glucose routinely as he hates to fingerstick.  His most recent A1c, checked at PCP office on 2/28 was 8.5%, increasing from last visit of 6.7%.  He notes his PCP took him off Metformin and started Jardiance.) An ACE inhibitor/angiotensin II receptor blocker is being taken. He does not see a podiatrist.Eye exam is current.  Hyperlipidemia This is a chronic problem. The current episode started more than 1 year ago.  The problem is controlled. Recent lipid tests were reviewed and are normal. Exacerbating diseases include chronic renal disease, diabetes and obesity. Factors aggravating his hyperlipidemia include fatty foods. Pertinent negatives include no chest pain, myalgias or shortness of breath. Current antihyperlipidemic treatment includes statins. The current treatment provides moderate improvement of lipids. Compliance problems include adherence to diet and adherence to exercise.  Risk factors for coronary artery disease include dyslipidemia, diabetes mellitus, family history, hypertension, male sex, obesity and a sedentary lifestyle.  Hypertension This is a chronic problem. The current episode started more than 1 year ago. The problem has been resolved since onset. The problem is controlled. Pertinent negatives include no chest pain, headaches, neck pain, palpitations or shortness of breath. There are no associated agents to hypertension. Risk factors for coronary artery disease include diabetes mellitus, dyslipidemia, male gender, obesity, sedentary lifestyle, family history and smoking/tobacco exposure. Past treatments include ACE inhibitors. The current treatment provides moderate improvement. There are no compliance problems.  Hypertensive end-organ damage includes kidney disease. Identifiable causes of hypertension include chronic renal disease.    Review of systems  Constitutional: + steadily decreasing body weight,  current Body mass index is 33.22 kg/m. , no fatigue, no subjective hyperthermia, no subjective hypothermia Eyes: no blurry vision, no xerophthalmia ENT: no sore throat, no nodules palpated in throat, no dysphagia/odynophagia, no hoarseness Cardiovascular: no chest pain, no shortness of breath, no palpitations, no leg swelling Respiratory: no cough, no shortness of breath Gastrointestinal: no nausea/vomiting/diarrhea Musculoskeletal: no muscle/joint aches Skin: no rashes, no  hyperemia Neurological: no tremors, no numbness, no tingling, no dizziness Psychiatric: no depression, no anxiety   Objective:    BP 119/79 (BP Location: Left Arm, Patient Position: Sitting, Cuff Size: Large)   Pulse 77   Ht 6\' 1"  (1.854 m)   Wt 251 lb 12.8 oz (114.2 kg)   BMI 33.22 kg/m   Wt Readings from Last 3 Encounters:  03/24/22 251 lb 12.8 oz (114.2  kg)  03/03/22 257 lb (116.6 kg)  09/23/21 256 lb 3.2 oz (116.2 kg)    BP Readings from Last 3 Encounters:  03/24/22 119/79  03/03/22 126/78  09/23/21 124/78     Physical Exam- Limited  Constitutional:  Body mass index is 33.22 kg/m. , not in acute distress, normal state of mind Eyes:  EOMI, no exophthalmos Musculoskeletal: no gross deformities, strength intact in all four extremities, no gross restriction of joint movements Skin:  no rashes, no hyperemia Neurological: no tremor with outstretched hands   Diabetic Foot Exam - Simple   No data filed     CMP ( most recent) CMP     Component Value Date/Time   NA 140 12/04/2021 0850   K 5.1 12/04/2021 0850   CL 101 12/04/2021 0850   CO2 21 12/04/2021 0850   GLUCOSE 191 (H) 12/04/2021 0850   GLUCOSE 208 (H) 04/03/2015 1500   BUN 15 12/04/2021 0850   CREATININE 0.98 12/04/2021 0850   CALCIUM 9.8 12/04/2021 0850   PROT 6.9 12/04/2021 0850   ALBUMIN 4.4 12/04/2021 0850   AST 19 12/04/2021 0850   ALT 24 12/04/2021 0850   ALKPHOS 111 12/04/2021 0850   BILITOT 0.2 12/04/2021 0850   GFRNONAA 66 01/18/2020 0842   GFRAA 76 01/18/2020 0842   Diabetic Labs (most recent): Lab Results  Component Value Date   HGBA1C 8.5 (H) 03/03/2022   HGBA1C 7.1 (H) 12/04/2021   HGBA1C 6.7 (H) 09/03/2021   MICROALBUR 80 03/23/2021   MICROALBUR 11.7 05/08/2019     Lipid Panel ( most recent) Lipid Panel     Component Value Date/Time   CHOL 142 12/04/2021 0850   TRIG 357 (H) 12/04/2021 0850   HDL 33 (L) 12/04/2021 0850   CHOLHDL 4.3 12/04/2021 0850   LDLCALC 54 12/04/2021  0850   LABVLDL 55 (H) 12/04/2021 0850     Lab Results  Component Value Date   TSH 4.170 09/03/2020   TSH 2.010 05/08/2019   TSH 2.13 02/07/2018   TSH 3.510 09/16/2016   TSH 1.270 09/11/2014   FREET4 1.38 09/03/2020   FREET4 1.48 05/08/2019     Assessment & Plan:   1) Controlled type 2 diabetes mellitus without complication  - Timothy Nelson has currently uncontrolled symptomatic type 2 DM since 57 years of age.  He presents today with no logs or meter to review, he does not monitor glucose routinely as he hates to fingerstick.  His most recent A1c, checked at PCP office on 2/28 was 8.5%, increasing from last visit of 6.7%.  He notes his PCP took him off Metformin and started [place].    -Recent labs reviewed.    - I had a long discussion with him about the progressive nature of diabetes and the pathology behind its complications. -his diabetes is complicated by obesity/sedentary life, history of smoking and he remains at a high risk for more acute and chronic complications which include CAD, CVA, CKD, retinopathy, and neuropathy. These are all discussed in detail with him.  - Nutritional counseling repeated at each appointment due to patients tendency to fall back in to old habits.  - The patient admits there is a room for improvement in their diet and drink choices. -  Suggestion is made for the patient to avoid simple carbohydrates from their diet including Cakes, Sweet Desserts / Pastries, Ice Cream, Soda (diet and regular), Sweet Tea, Candies, Chips, Cookies, Sweet Pastries, Store Bought Juices, Alcohol in Excess of 1-2 drinks a  day, Artificial Sweeteners, Coffee Creamer, and "Sugar-free" Products. This will help patient to have stable blood glucose profile and potentially avoid unintended weight gain.   - I encouraged the patient to switch to unprocessed or minimally processed complex starch and increased protein intake (animal or plant source), fruits, and vegetables.   -  Patient is advised to stick to a routine mealtimes to eat 3 meals a day and avoid unnecessary snacks (to snack only to correct hypoglycemia).  - he has seen Timothy Nelson, RDN, CDE for diabetes education.  - I have approached him with the following individualized plan to manage his diabetes and patient agrees:   -He is advised to continue his current medications including Trulicity 4.5 mg SQ weekly and Glipizide 5 mg XL daily with breakfast.   He prefers to stay off Metformin at this time.  He can continue his Jardiance 25 mg po daily for now.  He tells me he does have issue with yeast in his groin, especially during warmer months.  We talked about [place] gangrene today and when to seek help.  I did encourage him to use medicated powder to help prevent yeast formation.  I also informed him that if he has low symptoms or readings 3 times in a week we may need to look at stopping his Glipizide.  He is encouraged to continue monitoring blood glucose at least once daily (3 times per week), before breakfast and notify the clinic if glucose levels are less than 70 or greater than 200 for 3 tests in a row.  - Specific targets for  A1c;  LDL, HDL, Triglycerides, were discussed with the patient.  2) Blood Pressure /Hypertension:  His blood pressure is controlled to target.  He is advised to continue Lisinopril 20 mg po daily.    3) Lipids/Hyperlipidemia:  His recent lipid panel on 12/04/21 shows controlled LDL at 54 and elevated triglycerides of 357.  He is advised to continue Simvastatin 40 mg po daily at bedtime and Fish Oil 1200 mg po daily. Side effects and precautions discussed with him. He is advised to avoid fried foods and butter.    4)  Weight/Diet: His Body mass index is 33.22 kg/m.--  clearly complicating his diabetes care.  He is a candidate for modest weight loss.  I discussed with him the fact that loss of 5 - 10% of his  current body weight will have the most impact on his diabetes  management.  Exercise, and detailed carbohydrates information provided  -  detailed on discharge instructions.  5) Chronic Care/Health Maintenance: -he is on ACEI/ARB and Statin medications and is encouraged to initiate and continue to follow up with Ophthalmology, Dentist,  Podiatrist at least yearly or according to recommendations, and advised to stay away from smoking.  He is safe he does not believe in flu shots,  I have recommended yearly flu vaccine and pneumonia vaccine at least every 5 years; moderate intensity exercise for up to 150 minutes weekly; and  sleep for at least 7 hours a day.  - he is advised to maintain close follow up with Dettinger, Fransisca Kaufmann, MD for primary care needs, as well as his other providers for optimal and coordinated care.      I spent  34  minutes in the care of the patient today including review of labs from [place], Lipids, Thyroid Function, Hematology (current and previous including abstractions from other facilities); face-to-face time discussing  his blood glucose readings/logs, discussing hypoglycemia and hyperglycemia  episodes and symptoms, medications doses, his options of short and long term treatment based on the latest standards of care / guidelines;  discussion about incorporating lifestyle medicine;  and documenting the encounter. Risk reduction counseling performed per USPSTF guidelines to reduce obesity and cardiovascular risk factors.     Please refer to Patient Instructions for Blood Glucose Monitoring and Insulin/Medications Dosing Guide"  in media tab for additional information. Please  also refer to " Patient Self Inventory" in the Media  tab for reviewed elements of pertinent patient history.  Timothy Nelson participated in the discussions, expressed understanding, and voiced agreement with the above plans.  All questions were answered to his satisfaction. he is encouraged to contact clinic should he have any questions or concerns prior to his return  visit.    Follow up plan: - Return in about 3 months (around 06/24/2022) for Diabetes F/U with A1c in office, No previsit labs, Bring meter and logs.  Rayetta Pigg, [facility] [address] [place], [place] [address] Phone: [phone] Fax: [phone]  03/24/2022, 9:00 AM

## 2022-04-08 ENCOUNTER — Telehealth: Payer: Self-pay | Admitting: Family Medicine

## 2022-04-08 NOTE — Telephone Encounter (Signed)
Contacted Timothy Nelson to schedule their annual wellness visit. Appointment made for 04/14/2022 .  Thank you,  [name],  [place] [facility] Direct Dial ??[phone]

## 2022-04-14 ENCOUNTER — Ambulatory Visit (INDEPENDENT_AMBULATORY_CARE_PROVIDER_SITE_OTHER): Payer: Medicare HMO

## 2022-04-14 ENCOUNTER — Telehealth: Payer: Self-pay | Admitting: Acute Care

## 2022-04-14 VITALS — Ht 73.0 in | Wt 251.0 lb

## 2022-04-14 DIAGNOSIS — Z0000 Encounter for general adult medical examination without abnormal findings: Secondary | ICD-10-CM | POA: Diagnosis not present

## 2022-04-14 DIAGNOSIS — Z122 Encounter for screening for malignant neoplasm of respiratory organs: Secondary | ICD-10-CM

## 2022-04-14 DIAGNOSIS — F17210 Nicotine dependence, cigarettes, uncomplicated: Secondary | ICD-10-CM

## 2022-04-14 DIAGNOSIS — Z87891 Personal history of nicotine dependence: Secondary | ICD-10-CM

## 2022-04-14 NOTE — Patient Instructions (Signed)
[name] , Thank you for taking time to come for your Medicare Wellness Visit. I appreciate your ongoing commitment to your health goals. Please review the following plan we discussed and let me know if I can assist you in the future.   These are the goals we discussed:  Goals       Quit Smoking (pt-stated)      Quit smoking in 2019 - still not smoking - 04/07/2021 AH      Remain active and independent        This is a list of the screening recommended for you and due dates:  Health Maintenance  Topic Date Due   Screening for Lung Cancer  Never done   COVID-19 Vaccine (6 - 2023-24 season) 09/04/2021   Yearly kidney health urinalysis for diabetes  03/24/2022   Hepatitis C Screening: USPSTF Recommendation to screen - Ages 18-79 yo.  12/05/2022*   Flu Shot  08/05/2022   Eye exam for diabetics  08/26/2022   Hemoglobin A1C  09/01/2022   Yearly kidney function blood test for diabetes  12/05/2022   Complete foot exam   03/04/2023   Medicare Annual Wellness Visit  04/14/2023   Colon Cancer Screening  08/08/2028   DTaP/Tdap/Td vaccine (3 - Td or Tdap) 12/05/2031   HIV Screening  Completed   Zoster (Shingles) Vaccine  Completed   HPV Vaccine  Aged Out  *Topic was postponed. The date shown is not the original due date.    Advanced directives: Forms are available if you choose in the future to pursue completion.  This is recommended in order to make sure that your health wishes are honored in the event that you are unable to verbalize them to the provider.    Conditions/risks identified: Aim for 30 minutes of exercise or brisk walking, 6-8 glasses of water, and 5 servings of fruits and vegetables each day.   Next appointment: Follow up in one year for your annual wellness visit   The number for [facility] is [phone]  Preventive Care 40-64 Years, Male Preventive care refers to lifestyle choices and visits with your health care provider that can promote health and  wellness. What does preventive care include? A yearly physical exam. This is also called an annual well check. Dental exams once or twice a year. Routine eye exams. Ask your health care provider how often you should have your eyes checked. Personal lifestyle choices, including: Daily care of your teeth and gums. Regular physical activity. Eating a healthy diet. Avoiding tobacco and drug use. Limiting alcohol use. Practicing safe sex. Taking low-dose aspirin every day starting at age 57. What happens during an annual well check? The services and screenings done by your health care provider during your annual well check will depend on your age, overall health, lifestyle risk factors, and family history of disease. Counseling  Your health care provider may ask you questions about your: Alcohol use. Tobacco use. Drug use. Emotional well-being. Home and relationship well-being. Sexual activity. Eating habits. Work and work [employer]. Screening  You may have the following tests or measurements: Height, weight, and BMI. Blood pressure. Lipid and cholesterol levels. These may be checked every 5 years, or more frequently if you are over 57 years old. Skin check. Lung cancer screening. You may have this screening every year starting at age 57 if you have a 30-pack-year history of smoking and currently smoke or have quit within the past 15 years. Fecal occult blood test (FOBT) of  the stool. You may have this test every year starting at age 37. Flexible sigmoidoscopy or colonoscopy. You may have a sigmoidoscopy every 5 years or a colonoscopy every 10 years starting at age 73. Prostate cancer screening. Recommendations will vary depending on your family history and other risks. Hepatitis C blood test. Hepatitis B blood test. Sexually transmitted disease (STD) testing. Diabetes screening. This is done by checking your blood sugar (glucose) after you have not eaten for a while (fasting). You  may have this done every 1-3 years. Discuss your test results, treatment options, and if necessary, the need for more tests with your health care provider. Vaccines  Your health care provider may recommend certain vaccines, such as: Influenza vaccine. This is recommended every year. Tetanus, diphtheria, and acellular pertussis (Tdap, Td) vaccine. You may need a Td booster every 10 years. Zoster vaccine. You may need this after age 3. Pneumococcal 13-valent conjugate (PCV13) vaccine. You may need this if you have certain conditions and have not been vaccinated. Pneumococcal polysaccharide (PPSV23) vaccine. You may need one or two doses if you smoke cigarettes or if you have certain conditions. Talk to your health care provider about which screenings and vaccines you need and how often you need them. This information is not intended to replace advice given to you by your health care provider. Make sure you discuss any questions you have with your health care provider. Document Released: 01/17/2015 Document Revised: 09/10/2015 Document Reviewed: 10/22/2014 Elsevier Interactive Patient Education  2017 [employer].  Fall Prevention in the Home Falls can cause injuries. They can happen to people of all ages. There are many things you can do to make your home safe and to help prevent falls. What can I do on the outside of my home? Regularly fix the edges of walkways and driveways and fix any cracks. Remove anything that might make you trip as you walk through a door, such as a raised step or threshold. Trim any bushes or trees on the path to your home. Use bright outdoor lighting. Clear any walking paths of anything that might make someone trip, such as rocks or tools. Regularly check to see if handrails are loose or broken. Make sure that both sides of any steps have handrails. Any raised decks and porches should have guardrails on the edges. Have any leaves, snow, or ice cleared regularly. Use  sand or salt on walking paths during winter. Clean up any spills in your garage right away. This includes oil or grease spills. What can I do in the bathroom? Use night lights. Install grab bars by the toilet and in the tub and shower. Do not use towel bars as grab bars. Use non-skid mats or decals in the tub or shower. If you need to sit down in the shower, use a plastic, non-slip stool. Keep the floor dry. Clean up any water that spills on the floor as soon as it happens. Remove soap buildup in the tub or shower regularly. Attach bath mats securely with double-sided non-slip rug tape. Do not have throw rugs and other things on the floor that can make you trip. What can I do in the bedroom? Use night lights. Make sure that you have a light by your bed that is easy to reach. Do not use any sheets or blankets that are too big for your bed. They should not hang down onto the floor. Have a firm chair that has side arms. You can use this for support while  you get dressed. Do not have throw rugs and other things on the floor that can make you trip. What can I do in the kitchen? Clean up any spills right away. Avoid walking on wet floors. Keep items that you use a lot in easy-to-reach places. If you need to reach something above you, use a strong step stool that has a grab bar. Keep electrical cords out of the way. Do not use floor polish or wax that makes floors slippery. If you must use wax, use non-skid floor wax. Do not have throw rugs and other things on the floor that can make you trip. What can I do with my stairs? Do not leave any items on the stairs. Make sure that there are handrails on both sides of the stairs and use them. Fix handrails that are broken or loose. Make sure that handrails are as long as the stairways. Check any carpeting to make sure that it is firmly attached to the stairs. Fix any carpet that is loose or worn. Avoid having throw rugs at the top or bottom of the  stairs. If you do have throw rugs, attach them to the floor with carpet tape. Make sure that you have a light switch at the top of the stairs and the bottom of the stairs. If you do not have them, ask someone to add them for you. What else can I do to help prevent falls? Wear shoes that: Do not have high heels. Have rubber bottoms. Are comfortable and fit you well. Are closed at the toe. Do not wear sandals. If you use a stepladder: Make sure that it is fully opened. Do not climb a closed stepladder. Make sure that both sides of the stepladder are locked into place. Ask someone to hold it for you, if possible. Clearly mark and make sure that you can see: Any grab bars or handrails. First and last steps. Where the edge of each step is. Use tools that help you move around (mobility aids) if they are needed. These include: Canes. Walkers. Scooters. Crutches. Turn on the lights when you go into a dark area. Replace any light bulbs as soon as they burn out. Set up your furniture so you have a clear path. Avoid moving your furniture around. If any of your floors are uneven, fix them. If there are any pets around you, be aware of where they are. Review your medicines with your doctor. Some medicines can make you feel dizzy. This can increase your chance of falling. Ask your doctor what other things that you can do to help prevent falls. This information is not intended to replace advice given to you by your health care provider. Make sure you discuss any questions you have with your health care provider. Document Released: 10/17/2008 Document Revised: 05/29/2015 Document Reviewed: 01/25/2014 Elsevier Interactive Patient Education  2017 [employer].

## 2022-04-14 NOTE — Progress Notes (Signed)
Subjective:   Timothy Nelson is a 57 y.o. male who presents for Medicare Annual/Subsequent preventive examination.  I connected with  Nicholes Stairs on 04/14/22 by a audio enabled telemedicine application and verified that I am speaking with the correct person using two identifiers.  Patient Location: Home  Provider Location: Home Office  I discussed the limitations of evaluation and management by telemedicine. The patient expressed understanding and agreed to proceed.  Review of Systems     Cardiac Risk Factors include: advanced age (>74men, >77 women);diabetes mellitus;dyslipidemia;hypertension;male gender;sedentary lifestyle;smoking/ tobacco exposure     Objective:    Today's Vitals   04/14/22 0853  Weight: 251 lb (113.9 kg)  Height: 6\' 1"  (1.854 m)   Body mass index is 33.12 kg/m.     04/14/2022    8:59 AM 07/06/2021    9:35 AM 04/07/2021    2:38 PM 04/02/2020    3:32 PM 04/01/2020    8:20 AM 12/06/2017    3:05 PM 09/21/2017    9:58 AM  Advanced Directives  Does Patient Have a Medical Advance Directive? No No No No No No No  Would patient like information on creating a medical advance directive? No - Patient declined  No - Patient declined No - Patient declined No - Patient declined Yes (MAU/Ambulatory/Procedural Areas - Information given)     Current Medications (verified) Outpatient Encounter Medications as of 04/14/2022  Medication Sig   Accu-Chek Softclix Lancets lancets Test BS TID Dx E11.9   Blood Glucose Monitoring Suppl (ACCU-CHEK GUIDE) w/Device KIT Test BS TID Dx E11.9   Dulaglutide (TRULICITY) 4.5 MG/0.5ML SOPN Inject 4.5 mg as directed once a week.   empagliflozin (JARDIANCE) 25 MG TABS tablet Take 1 tablet (25 mg total) by mouth daily before breakfast.   fluticasone (FLONASE) 50 MCG/ACT nasal spray USE 1 SPRAY IN EACH NOSTRIL TWICE DAILY AS NEEDED FOR ALLERGIES OR RHINITIS (SUBSTITUTED FOR FLONASE)   glipiZIDE (GLUCOTROL XL) 5 MG 24 hr tablet TAKE 1 TABLET EVERY  DAY WITH BREAKFAST   glucose blood test strip Test BS TID Dx E11.9   ibuprofen (ADVIL) 600 MG tablet Take 1 tablet (600 mg total) by mouth every 8 (eight) hours as needed for mild pain or moderate pain.   ketoconazole (NIZORAL) 2 % cream Apply 1 application topically daily.   lisinopril (ZESTRIL) 20 MG tablet Take 1 tablet (20 mg total) by mouth daily.   Omega-3 Fatty Acids (FISH OIL) 1200 MG CPDR Take 1,200 mg by mouth daily.   omeprazole (PRILOSEC) 40 MG capsule TAKE 1 CAPSULE EVERY DAY   [START ON 04/30/2022] Oxycodone HCl 10 MG TABS Take 1 tablet (10 mg total) by mouth 4 (four) times daily as needed.   Oxycodone HCl 10 MG TABS Take 1 tablet (10 mg total) by mouth 4 (four) times daily as needed.   Oxycodone HCl 10 MG TABS Take 1 tablet (10 mg total) by mouth 4 (four) times daily as needed.   simvastatin (ZOCOR) 40 MG tablet Take 1 tablet (40 mg total) by mouth daily at 6 PM.   triamcinolone cream (KENALOG) 0.1 % Apply 1 application topically 2 (two) times daily.   No facility-administered encounter medications on file as of 04/14/2022.    Allergies (verified) Fenofibrate   History: Past Medical History:  Diagnosis Date   Arthritis    Barrett's esophagus    Chronic pain    Diabetes    Essential hypertension    Gastroparesis    GERD (gastroesophageal reflux disease)  Hyperlipidemia    Obesity    Type 2 diabetes mellitus    Past Surgical History:  Procedure Laterality Date   ESOPHAGOGASTRODUODENOSCOPY (EGD) WITH PROPOFOL N/A 04/04/2015   Procedure: ESOPHAGOGASTRODUODENOSCOPY (EGD) WITH PROPOFOL;  Surgeon: Malissa HippoNajeeb U Rehman, MD;  Location: [facility];  Service: Endoscopy;  Laterality: N/A;  8:30   FRACTURE SURGERY  March 2007   Left arm, left thumb, bilateral legs   HIP SURGERY Right    WRIST FRACTURE SURGERY Left    Family History  Problem Relation Age of Onset   Diabetes Mother    Hypertension Mother    Hyperlipidemia Mother    Arthritis Mother    Esophageal cancer  Mother        patient unsure   Diabetes Father    Heart disease Father        Age 57   Kidney disease Father    Hypertension Sister    Diabetes Daughter    Colon cancer Neg Hx    Stomach cancer Neg Hx    Pancreatic cancer Neg Hx    Liver disease Neg Hx    Social History   Socioeconomic History   Marital status: Married    Spouse name: Not on file   Number of children: 1   Years of education: Not on file   Highest education level: Associate degree: occupational, Scientist, product/process developmenttechnical, or vocational program  Occupational History   Occupation: Disability  Tobacco Use   Smoking status: Some Days    Packs/day: 1.00    Years: 20.00    Additional pack years: 0.00    Total pack years: 20.00    Types: Cigars, Cigarettes   Smokeless tobacco: Never   Tobacco comments:    Smoked intermittently for 20 years; 1-2 cigars weekly as of 02/07/2018  Vaping Use   Vaping Use: Never used  Substance and Sexual Activity   Alcohol use: Yes    Alcohol/week: 1.0 standard drink of alcohol    Types: 1 Glasses of wine per week    Comment: glass wine a week.    Drug use: No   Sexual activity: Yes  Other Topics Concern   Not on file  Social History Narrative   Retired IT sales professionalfirefighter    Social Determinants of Corporate investment bankerHealth   Financial Resource Strain: Low Risk  (04/14/2022)   Overall Financial Resource Strain (CARDIA)    Difficulty of Paying Living Expenses: Not hard at all  Food Insecurity: No Food Insecurity (04/14/2022)   Hunger Vital Sign    Worried About Running Out of Food in the Last Year: Never true    Ran Out of Food in the Last Year: Never true  Transportation Needs: No Transportation Needs (04/14/2022)   PRAPARE - Administrator, Civil ServiceTransportation    Lack of Transportation (Medical): No    Lack of Transportation (Non-Medical): No  Physical Activity: Insufficiently Active (04/14/2022)   Exercise Vital Sign    Days of Exercise per Week: 3 days    Minutes of Exercise per Session: 30 min  Stress: No Stress Concern Present  (04/14/2022)   [employer] - Occupational Stress Questionnaire    Feeling of Stress : Not at all  Social Connections: Socially Integrated (04/14/2022)   Social Connection and Isolation Panel [NHANES]    Frequency of Communication with Friends and Family: More than three times a week    Frequency of Social Gatherings with Friends and Family: Three times a week    Attends Religious Services: More than 4 times  per year    Active Member of Clubs or Organizations: Yes    Attends Banker Meetings: More than 4 times per year    Marital Status: Married    Tobacco Counseling Ready to quit: Not Answered Counseling given: Not Answered Tobacco comments: Smoked intermittently for 20 years; 1-2 cigars weekly as of 02/07/2018   Clinical Intake:  Pre-visit preparation completed: Yes  Pain : No/denies pain  Diabetes: Yes CBG done?: No Did pt. bring in CBG monitor from home?: No  How often do you need to have someone help you when you read instructions, pamphlets, or other written materials from your doctor or pharmacy?: 1 - Never  Diabetic?Yes   Nutrition Risk Assessment:  Has the patient had any N/V/D within the last 2 months?  No  Does the patient have any non-healing wounds?  No  Has the patient had any unintentional weight loss or weight gain?  No   Diabetes:  Is the patient diabetic?  Yes  If diabetic, was a CBG obtained today?  No  Did the patient bring in their glucometer from home?  No  How often do you monitor your CBG's? daily.   Financial Strains and Diabetes Management:  Are you having any financial strains with the device, your supplies or your medication? No .  Does the patient want to be seen by Chronic Care Management for management of their diabetes?  No  Would the patient like to be referred to a Nutritionist or for Diabetic Management?  No   Diabetic Exams:  Diabetic Eye Exam: Completed 08/25/21 Diabetic Foot Exam:  Completed 03/03/22   Interpreter Needed?: No  Information entered by :: Kandis Fantasia LPN   Activities of Daily Living    04/14/2022    8:59 AM  In your present state of health, do you have any difficulty performing the following activities:  Hearing? 0  Vision? 0  Difficulty concentrating or making decisions? 0  Walking or climbing stairs? 0  Dressing or bathing? 0  Doing errands, shopping? 0  Preparing Food and eating ? N  Using the Toilet? N  In the past six months, have you accidently leaked urine? N  Do you have problems with loss of bowel control? N  Managing your Medications? N  Managing your Finances? N  Housekeeping or managing your Housekeeping? N    Patient Care Team: Dettinger, Elige Radon, MD as PCP - General (Family Medicine) Sonnie Alamo, DPM as Referring Physician (Podiatry) Eleanora Neighbor, MD as Referring Physician (Gastroenterology) Dani Gobble, NP as Nurse Practitioner (Endocrinology)  Indicate any recent Medical Services you may have received from other than [facility] providers in the past year (date may be approximate).     Assessment:   This is a routine wellness examination for Marcos.  Hearing/Vision screen Hearing Screening - Comments:: Denies hearing difficulties  Vision Screening - Comments::  up to date with routine eye exams with [facility]    Dietary issues and exercise activities discussed: Current Exercise Habits: Home exercise routine, Type of exercise: walking;stretching;strength training/weights, Time (Minutes): 30, Frequency (Times/Week): 3, Weekly Exercise (Minutes/Week): 90, Intensity: Mild   Goals Addressed             This Visit's Progress    COMPLETED: Patient Stated       04/07/2021 AWV Goal: Exercise for General Health  Patient will verbalize understanding of the benefits of increased physical activity: Exercising regularly is important. It will improve your overall fitness, flexibility, and endurance.  Regular  exercise also will improve your overall health. It can help you control your weight, reduce stress, and improve your bone density. Over the next year, patient will increase physical activity as tolerated with a goal of at least 150 minutes of moderate physical activity per week.  You can tell that you are exercising at a moderate intensity if your heart starts beating faster and you start breathing faster but can still hold a conversation. Moderate-intensity exercise ideas include: Walking 1 mile (1.6 km) in about 15 minutes Biking Hiking Golfing Dancing Water aerobics Patient will verbalize understanding of everyday activities that increase physical activity by providing examples like the following: Yard work, such as: Insurance underwriter Gardening Washing windows or floors Patient will be able to explain general safety guidelines for exercising:  Before you start a new exercise program, talk with your health care provider. Do not exercise so much that you hurt yourself, feel dizzy, or get very short of breath. Wear comfortable clothes and wear shoes with good support. Drink plenty of water while you exercise to prevent dehydration or heat stroke. Work out until your breathing and your heartbeat get faster.      Remain active and independent        Depression Screen    04/14/2022    8:58 AM 03/03/2022    9:54 AM 12/04/2021    8:16 AM 09/03/2021    8:11 AM 06/03/2021   10:52 AM 04/07/2021    2:35 PM 03/04/2021   12:57 PM  PHQ 2/9 Scores  PHQ - 2 Score 0 0 0 0 0 0 0  PHQ- 9 Score 0 0   0 0     Fall Risk    04/14/2022    8:56 AM 03/03/2022    9:54 AM 12/04/2021    8:16 AM 09/03/2021    8:11 AM 06/03/2021   10:52 AM  Fall Risk   Falls in the past year? 0 0 0 0 0  Number falls in past yr: 0      Injury with Fall? 0      Risk for fall due to : No Fall Risks      Follow up Falls prevention  discussed;Education provided;Falls evaluation completed        FALL RISK PREVENTION PERTAINING TO THE HOME:  Any stairs in or around the home? No  If so, are there any without handrails? No  Home free of loose throw rugs in walkways, pet beds, electrical cords, etc? Yes  Adequate lighting in your home to reduce risk of falls? Yes   ASSISTIVE DEVICES UTILIZED TO PREVENT FALLS:  Life alert? No  Use of a cane, walker or w/c? No  Grab bars in the bathroom? Yes  Shower chair or bench in shower? No  Elevated toilet seat or a handicapped toilet? Yes   TIMED UP AND GO:  Was the test performed? No . Telephonic visit   Cognitive Function:    09/29/2016    9:04 AM  MMSE - Mini Mental State Exam  Orientation to time 5  Orientation to Place 5  Registration 3  Attention/ Calculation 5  Recall 3  Language- name 2 objects 2  Language- repeat 1  Language- follow 3 step command 3  Language- read & follow direction 1  Write a sentence 1  Copy design 1  Total score 30        04/14/2022  9:00 AM 04/07/2021    2:37 PM 04/02/2020    3:34 PM 12/06/2017    3:42 PM  6CIT Screen  What Year? 0 points 0 points 0 points 0 points  What month? 0 points 0 points 0 points 0 points  What time? 0 points 0 points 0 points 0 points  Count back from 20 0 points 0 points 0 points 0 points  Months in reverse 0 points 0 points 0 points 0 points  Repeat phrase 0 points 0 points 0 points 0 points  Total Score 0 points 0 points 0 points 0 points    Immunizations Immunization History  Administered Date(s) Administered   Influenza,inj,Quad PF,6+ Mos 11/15/2018   Influenza-Unspecified 09/04/2016   Moderna Covid-19 Vaccine Bivalent Booster 50yrs & up 02/14/2021   Moderna Sars-Covid-2 Vaccination 03/13/2019, 04/10/2019, 11/06/2019, 06/19/2020   Pneumococcal Polysaccharide-23 09/16/2016   Tdap 04/05/2011, 12/04/2021   Zoster Recombinat (Shingrix) 09/03/2020, 03/04/2021    TDAP status: Up to date  Flu  Vaccine status: Up to date  Pneumococcal vaccine status: Up to date  Covid-19 vaccine status: Information provided on how to obtain vaccines.   Qualifies for Shingles Vaccine? Yes   Zostavax completed No   Shingrix Completed?: Yes  Screening Tests Health Maintenance  Topic Date Due   Lung Cancer Screening  Never done   COVID-19 Vaccine (6 - 2023-24 season) 09/04/2021   Diabetic kidney evaluation - Urine ACR  03/24/2022   Hepatitis C Screening  12/05/2022 (Originally 07/08/1983)   INFLUENZA VACCINE  08/05/2022   OPHTHALMOLOGY EXAM  08/26/2022   HEMOGLOBIN A1C  09/01/2022   Diabetic kidney evaluation - eGFR measurement  12/05/2022   FOOT EXAM  03/04/2023   Medicare Annual Wellness (AWV)  04/14/2023   COLONOSCOPY (Pts 45-76yrs Insurance coverage will need to be confirmed)  08/08/2028   DTaP/Tdap/Td (3 - Td or Tdap) 12/05/2031   HIV Screening  Completed   Zoster Vaccines- Shingrix  Completed   HPV VACCINES  Aged Out    Health Maintenance  Health Maintenance Due  Topic Date Due   Lung Cancer Screening  Never done   COVID-19 Vaccine (6 - 2023-24 season) 09/04/2021   Diabetic kidney evaluation - Urine ACR  03/24/2022    Colorectal cancer screening: Type of screening: Colonoscopy. Completed 08/09/18. Repeat every 10 years (patient requesting a referral to Dr. Elnoria Howard)   Lung Cancer Screening: (Low Dose CT Chest recommended if Age 63-80 years, 30 pack-year currently smoking OR have quit w/in 15years.) does qualify.   Lung Cancer Screening Referral: referral entered and pt provided with number to schedule   Additional Screening:  Hepatitis C Screening: does qualify  Vision Screening: Recommended annual ophthalmology exams for early detection of glaucoma and other disorders of the eye. Is the patient up to date with their annual eye exam?  Yes  Who is the provider or what is the name of the office in which the patient attends annual eye exams? [facility]  If pt is not  established with a provider, would they like to be referred to a provider to establish care? No .   Dental Screening: Recommended annual dental exams for proper oral hygiene  Community Resource Referral / Chronic Care Management: CRR required this visit?  No   CCM required this visit?  No      Plan:     I have personally reviewed and noted the following in the patient's chart:   Medical and social history Use of alcohol, tobacco or illicit  drugs  Current medications and supplements including opioid prescriptions. Patient is currently taking opioid prescriptions. Information provided to patient regarding non-opioid alternatives. Patient advised to discuss non-opioid treatment plan with their provider. Functional ability and status Nutritional status Physical activity Advanced directives List of other physicians Hospitalizations, surgeries, and ER visits in previous 12 months Vitals Screenings to include cognitive, depression, and falls Referrals and appointments  In addition, I have reviewed and discussed with patient certain preventive protocols, quality metrics, and best practice recommendations. A written personalized care plan for preventive services as well as general preventive health recommendations were provided to patient.     Durwin Nora, [place]   1/61/0960   Due to this being a virtual visit, the after visit summary with patients personalized plan was offered to patient via mail or my-chart.  Patient would like to access on my-chart  Nurse Notes: Patient is asking for status on referral to GI

## 2022-04-14 NOTE — Telephone Encounter (Signed)
Pt called the office checking to see when he would be receiving a call for the cancer screening visit. States that it has been about two months since the referral and he is still waiting. Please advise on this.

## 2022-04-14 NOTE — Telephone Encounter (Signed)
Spoke with patient Scheduled SDMV 04/22/22 11:00 CT scheduled 04/27/22 8:30 Pt voiced understanding and had no further questions.

## 2022-04-22 ENCOUNTER — Ambulatory Visit (INDEPENDENT_AMBULATORY_CARE_PROVIDER_SITE_OTHER): Payer: Medicare HMO | Admitting: Physician Assistant

## 2022-04-22 ENCOUNTER — Encounter: Payer: Self-pay | Admitting: Physician Assistant

## 2022-04-22 DIAGNOSIS — F17210 Nicotine dependence, cigarettes, uncomplicated: Secondary | ICD-10-CM | POA: Diagnosis not present

## 2022-04-22 NOTE — Patient Instructions (Signed)
Thank you for participating in the [facility]. It was our pleasure to meet you today. We will call you with the results of your scan within the next few days. Your scan will be assigned a Lung RADS category score by the physicians reading the scans.  This Lung RADS score determines follow up scanning.  See below for description of categories, and follow up screening recommendations. We will be in touch to schedule your follow up screening annually or based on recommendations of our providers. We will fax a copy of your scan results to your Primary Care Physician, or the physician who referred you to the program, to ensure they have the results. Please call the office if you have any questions or concerns regarding your scanning experience or results.  Our office number is [phone]. Please speak with Denise Phelps, RN. , or  Denise Buckner RN, They are  our Lung Cancer Screening RN.'s If They are unavailable when you call, Please leave a message on the voice mail. We will return your call at our earliest convenience.This voice mail is monitored several times a day.  Remember, if your scan is normal, we will scan you annually as long as you continue to meet the criteria for the program. (Age 50-80, Current smoker or smoker who has quit within the last 15 years). If you are a smoker, remember, quitting is the single most powerful action that you can take to decrease your risk of lung cancer and other pulmonary, breathing related problems. We know quitting is hard, and we are here to help.  Please let us know if there is anything we can do to help you meet your goal of quitting. If you are a former smoker, congratulations. We are proud of you! Remain smoke free! Remember you can refer friends or family members through the number above.  We will screen them to make sure they meet criteria for the program. Thank you for helping us take better care of you by  participating in Lung Screening.  You can receive free nicotine replacement therapy ( patches, gum or mints) by calling 1-800-QUIT NOW. Please call so we can get you on the path to becoming  a non-smoker. I know it is hard, but you can do this!  Lung RADS Categories:  Lung RADS 1: no nodules or definitely non-concerning nodules.  Recommendation is for a repeat annual scan in 12 months.  Lung RADS 2:  nodules that are non-concerning in appearance and behavior with a very low likelihood of becoming an active cancer. Recommendation is for a repeat annual scan in 12 months.  Lung RADS 3: nodules that are probably non-concerning , includes nodules with a low likelihood of becoming an active cancer.  Recommendation is for a 6-month repeat screening scan. Often noted after an upper respiratory illness. We will be in touch to make sure you have no questions, and to schedule your 6-month scan.  Lung RADS 4 A: nodules with concerning findings, recommendation is most often for a follow up scan in 3 months or additional testing based on our provider's assessment of the scan. We will be in touch to make sure you have no questions and to schedule the recommended 3 month follow up scan.  Lung RADS 4 B:  indicates findings that are concerning. We will be in touch with you to schedule additional diagnostic testing based on our provider's  assessment of the scan.  Other options for assistance in smoking cessation (   As covered by your insurance benefits)  Hypnosis for smoking cessation  [employer] [phone]  Acupuncture for smoking cessation  [facility] [phone]   

## 2022-04-22 NOTE — Progress Notes (Signed)
Virtual Visit via Telephone Note  I connected with Timothy Nelson on 04/22/22 at 11:00 AM EDT by telephone and verified that I am speaking with the correct person using two identifiers.  Location: Patient: home Provider: working from home   I discussed the limitations, risks, security and privacy concerns of performing an evaluation and management service by telephone and the availability of in person appointments. I also discussed with the patient that there may be a patient responsible charge related to this service. The patient expressed understanding and agreed to proceed.      Shared Decision Making Visit Lung Cancer Screening Program [phone])   Eligibility: Age 57 y.o. Pack Years Smoking History Calculation 34 (# packs/per year x # years smoked) Recent History of coughing up blood  no Unexplained weight loss? no ( >Than 15 pounds within the last 6 months ) Prior History Lung / other cancer no (Diagnosis within the last 5 years already requiring surveillance chest CT Scans). Smoking Status Current Smoker Former Smokers: Years since quit: n/a  Quit Date: n/a  Visit Components: Discussion included one or more decision making aids. yes Discussion included risk/benefits of screening. yes Discussion included potential follow up diagnostic testing for abnormal scans. yes Discussion included meaning and risk of over diagnosis. yes Discussion included meaning and risk of False Positives. yes Discussion included meaning of total radiation exposure. yes  Counseling Included: Importance of adherence to annual lung cancer LDCT screening. yes Impact of comorbidities on ability to participate in the program. yes Ability and willingness to under diagnostic treatment. yes  Smoking Cessation Counseling: Current Smokers:  Discussed importance of smoking cessation. yes Information about tobacco cessation classes and interventions provided to patient. yes Patient provided with  "ticket" for LDCT Scan. N/a Symptomatic Patient. no Diagnosis Code: Tobacco Use Z72.0 Asymptomatic Patient yes  Counseling (Intermediate counseling: > three minutes counseling) U9811 Diagnosis Code: Personal History of Nicotine Dependence. B14.782 Information about tobacco cessation classes and interventions provided to patient. Yes Written Order for Lung Cancer Screening with LDCT placed in Epic. Yes (CT Chest Lung Cancer Screening Low Dose W/O CM) [id number] Z12.2-Screening of respiratory organs Z87.891-Personal history of nicotine dependence  I have spent 25 minutes of face to face/ virtual visit   time with the patient discussing the risks and benefits of lung cancer screening. We viewed / discussed a power point together that explained in detail the above noted topics. We paused at intervals to allow for questions to be asked and answered to ensure understanding.We discussed that the single most powerful action that he can take to decrease his risk of developing lung cancer is to quit smoking. We discussed whether or not he is ready to commit to setting a quit date. We discussed options for tools to aid in quitting smoking including nicotine replacement therapy, non-nicotine medications, support groups, Quit Smart classes, and behavior modification. We discussed that often times setting smaller, more achievable goals, such as eliminating 1 cigarette a day for a week and then 2 cigarettes a day for a week can be helpful in slowly decreasing the number of cigarettes smoked. This allows for a sense of accomplishment as well as providing a clinical benefit. I provided  him  with smoking cessation  information  with contact information for community resources, classes, free nicotine replacement therapy, and access to mobile apps, text messaging, and on-line smoking cessation help. I have also provided  him  the office contact information in the event he needs to contact me, or  the screening staff. We  discussed the time and location of the scan, and that either Abigail Miyamoto RN, Karlton Lemon, RN  or I will call / send a letter with the results within 24-72 hours of receiving them. The patient verbalized understanding of all of  the above and had no further questions upon leaving the office. They have my contact information in the event they have any further questions.  I spent three minutes counseling on smoking cessation and the health risks of continued tobacco abuse.  I explained to the patient that there has been a high incidence of coronary artery disease noted on these exams. I explained that this is a non-gated exam therefore degree or severity cannot be determined. This patient is on statin therapy. I have asked the patient to follow-up with their PCP regarding any incidental finding of coronary artery disease and management with diet or medication as their PCP  feels is clinically indicated. The patient verbalized understanding of the above and had no further questions upon completion of the visit.       Darcella Gasman Lunah Losasso, PA-C

## 2022-04-27 ENCOUNTER — Ambulatory Visit (HOSPITAL_COMMUNITY)
Admission: RE | Admit: 2022-04-27 | Discharge: 2022-04-27 | Disposition: A | Discharge status: Home / Self Care | Payer: Medicare HMO | Source: Ambulatory Visit | Attending: Acute Care | Admitting: Acute Care

## 2022-04-27 DIAGNOSIS — Z87891 Personal history of nicotine dependence: Secondary | ICD-10-CM | POA: Diagnosis not present

## 2022-04-27 DIAGNOSIS — F17210 Nicotine dependence, cigarettes, uncomplicated: Secondary | ICD-10-CM | POA: Diagnosis not present

## 2022-04-27 DIAGNOSIS — Z122 Encounter for screening for malignant neoplasm of respiratory organs: Secondary | ICD-10-CM

## 2022-04-30 ENCOUNTER — Other Ambulatory Visit: Payer: Self-pay

## 2022-04-30 DIAGNOSIS — Z122 Encounter for screening for malignant neoplasm of respiratory organs: Secondary | ICD-10-CM

## 2022-04-30 DIAGNOSIS — F17210 Nicotine dependence, cigarettes, uncomplicated: Secondary | ICD-10-CM

## 2022-04-30 DIAGNOSIS — Z87891 Personal history of nicotine dependence: Secondary | ICD-10-CM

## 2022-05-04 ENCOUNTER — Telehealth: Payer: Self-pay | Admitting: Family Medicine

## 2022-05-04 DIAGNOSIS — I2510 Atherosclerotic heart disease of native coronary artery without angina pectoris: Secondary | ICD-10-CM

## 2022-05-04 NOTE — Telephone Encounter (Signed)
Lmtcb.

## 2022-05-04 NOTE — Telephone Encounter (Signed)
Patient aware will place referral

## 2022-05-04 NOTE — Telephone Encounter (Signed)
-----   Message from Joshua A Dettinger, MD sent at 04/30/2022  9:15 AM EDT ----- Patient's CT scan showed no signs of lung cancer so the lungs look good.  It did show like he has coronary atherosclerosis or some possible blockages in all 3 vessels of his heart, I would recommend that he follow-up with a cardiologist and be evaluated further for this.  We can discuss this further and placed a referral when he comes back for his next visit.  Scan also showed a mild amount of fatty liver disease. ----- Message ----- From: Buckner, Denise, RN Sent: 04/30/2022   7:58 AM EDT To: Joshua A Dettinger, MD   

## 2022-05-04 NOTE — Telephone Encounter (Signed)
-----   Message from Nils Pyle, MD sent at 04/30/2022  9:15 AM EDT ----- Patient's CT scan showed no signs of lung cancer so the lungs look good.  It did show like he has coronary atherosclerosis or some possible blockages in all 3 vessels of his heart, I would recommend that he follow-up with a cardiologist and be evaluated further for this.  We can discuss this further and placed a referral when he comes back for his next visit.  Scan also showed a mild amount of fatty liver disease. ----- Message ----- From: Karlton Lemon, RN Sent: 04/30/2022   7:58 AM EDT To: Elige Radon Dettinger, MD

## 2022-05-07 DIAGNOSIS — I10 Essential (primary) hypertension: Secondary | ICD-10-CM | POA: Diagnosis not present

## 2022-05-07 DIAGNOSIS — E119 Type 2 diabetes mellitus without complications: Secondary | ICD-10-CM | POA: Diagnosis not present

## 2022-05-07 DIAGNOSIS — M1990 Unspecified osteoarthritis, unspecified site: Secondary | ICD-10-CM | POA: Diagnosis not present

## 2022-05-07 DIAGNOSIS — K219 Gastro-esophageal reflux disease without esophagitis: Secondary | ICD-10-CM | POA: Diagnosis not present

## 2022-05-07 DIAGNOSIS — J449 Chronic obstructive pulmonary disease, unspecified: Secondary | ICD-10-CM | POA: Diagnosis not present

## 2022-05-07 DIAGNOSIS — E7800 Pure hypercholesterolemia, unspecified: Secondary | ICD-10-CM | POA: Diagnosis not present

## 2022-05-07 DIAGNOSIS — K3184 Gastroparesis: Secondary | ICD-10-CM | POA: Diagnosis not present

## 2022-05-13 DIAGNOSIS — M2042 Other hammer toe(s) (acquired), left foot: Secondary | ICD-10-CM | POA: Diagnosis not present

## 2022-05-13 DIAGNOSIS — E1142 Type 2 diabetes mellitus with diabetic polyneuropathy: Secondary | ICD-10-CM | POA: Diagnosis not present

## 2022-05-13 DIAGNOSIS — M2041 Other hammer toe(s) (acquired), right foot: Secondary | ICD-10-CM | POA: Diagnosis not present

## 2022-05-13 DIAGNOSIS — L602 Onychogryphosis: Secondary | ICD-10-CM | POA: Diagnosis not present

## 2022-05-13 DIAGNOSIS — B353 Tinea pedis: Secondary | ICD-10-CM | POA: Diagnosis not present

## 2022-06-02 ENCOUNTER — Telehealth: Payer: Self-pay | Admitting: Family Medicine

## 2022-06-02 ENCOUNTER — Ambulatory Visit (INDEPENDENT_AMBULATORY_CARE_PROVIDER_SITE_OTHER): Payer: Medicare HMO | Admitting: Family Medicine

## 2022-06-02 ENCOUNTER — Encounter: Payer: Self-pay | Admitting: Family Medicine

## 2022-06-02 VITALS — BP 112/72 | HR 78 | Ht 73.0 in | Wt 250.0 lb

## 2022-06-02 DIAGNOSIS — E782 Mixed hyperlipidemia: Secondary | ICD-10-CM

## 2022-06-02 DIAGNOSIS — M79671 Pain in right foot: Secondary | ICD-10-CM

## 2022-06-02 DIAGNOSIS — I152 Hypertension secondary to endocrine disorders: Secondary | ICD-10-CM | POA: Diagnosis not present

## 2022-06-02 DIAGNOSIS — G629 Polyneuropathy, unspecified: Secondary | ICD-10-CM

## 2022-06-02 DIAGNOSIS — E1159 Type 2 diabetes mellitus with other circulatory complications: Secondary | ICD-10-CM

## 2022-06-02 DIAGNOSIS — Z125 Encounter for screening for malignant neoplasm of prostate: Secondary | ICD-10-CM | POA: Diagnosis not present

## 2022-06-02 DIAGNOSIS — G8929 Other chronic pain: Secondary | ICD-10-CM

## 2022-06-02 DIAGNOSIS — E1169 Type 2 diabetes mellitus with other specified complication: Secondary | ICD-10-CM | POA: Diagnosis not present

## 2022-06-02 LAB — CBC WITH DIFFERENTIAL/PLATELET
Basophils Absolute: 0 10*3/uL (ref 0.0–0.2)
Eos: 1 %
Immature Grans (Abs): 0 10*3/uL (ref 0.0–0.1)
Immature Granulocytes: 0 %
MCHC: 34.4 g/dL (ref 31.5–35.7)
Monocytes Absolute: 1.1 10*3/uL — ABNORMAL HIGH (ref 0.1–0.9)
Neutrophils Absolute: 7.3 10*3/uL — ABNORMAL HIGH (ref 1.4–7.0)

## 2022-06-02 LAB — CMP14+EGFR

## 2022-06-02 LAB — BAYER DCA HB A1C WAIVED: HB A1C (BAYER DCA - WAIVED): 6.4 % — ABNORMAL HIGH (ref 4.8–5.6)

## 2022-06-02 MED ORDER — OXYCODONE HCL 10 MG PO TABS
10.0000 mg | ORAL_TABLET | Freq: Four times a day (QID) | ORAL | 0 refills | Status: DC | PRN
Start: 2022-07-02 — End: 2022-08-09

## 2022-06-02 MED ORDER — OXYCODONE HCL 10 MG PO TABS
10.0000 mg | ORAL_TABLET | Freq: Four times a day (QID) | ORAL | 0 refills | Status: DC | PRN
Start: 2022-08-01 — End: 2022-09-13

## 2022-06-02 MED ORDER — OXYCODONE HCL 10 MG PO TABS
10.0000 mg | ORAL_TABLET | Freq: Four times a day (QID) | ORAL | 0 refills | Status: DC | PRN
Start: 2022-06-02 — End: 2022-09-13

## 2022-06-02 NOTE — Telephone Encounter (Signed)
[facility] needs OV notes and any labs from 05/04/22 to present, faxed to them at [phone] Attn: Kennyth Arnold

## 2022-06-02 NOTE — Telephone Encounter (Signed)
Called [facility], they are on epic and able to view OV notes

## 2022-06-02 NOTE — Progress Notes (Signed)
BP 112/72   Pulse 78   Ht 6\' 1"  (1.854 m)   Wt 250 lb (113.4 kg)   SpO2 97%   BMI 32.98 kg/m    Subjective:   Patient ID: Timothy Nelson, male    DOB: [birth year], 57 y.o.   MRN: [id number]  HPI: Timothy Nelson is a 57 y.o. male presenting on 06/02/2022 for Medical Management of Chronic Issues and Diabetes   HPI Type 2 diabetes mellitus Patient comes in today for recheck of his diabetes. Patient has been currently taking Jardiance and Trulicity. Patient is currently on an ACE inhibitor/ARB. Patient has not seen an ophthalmologist this year. Patient denies any new issues with their feet. The symptom started onset as an adult hypertension and hyperlipidemia ARE RELATED TO DM   Hypertension Patient is currently on lisinopril, and their blood pressure today is 112/72. Patient denies any lightheadedness or dizziness. Patient denies headaches, blurred vision, chest pains, shortness of breath, or weakness. Denies any side effects from medication and is content with current medication.   Hyperlipidemia Patient is coming in for recheck of his hyperlipidemia. The patient is currently taking fish oils and simvastatin. They deny any issues with myalgias or history of liver damage from it. They deny any focal numbness or weakness or chest pain.   Pain assessment: Cause of pain-feet and ankle deformity Pain location-ankle and feet Pain on scale of 1-10- 6 Frequency-daily What increases pain-being on his feet What makes pain Better-rest and medication Effects on ADL -minimal Any change in general medical condition-none  Current opioids rx-oxycodone 10 mg 4 times daily as needed # meds rx-120 Effectiveness of current meds-works well Adverse reactions from pain meds-none Morphine equivalent-60  Pill count performed-No Last drug screen -03/05/2022 ( high risk q78m, moderate risk q40m, low risk yearly ) Urine drug screen today- No Was the [facility] reviewed-yes  If yes were their any concerning  findings? -None  Pain contract signed on: 03/05/2022  Relevant past medical, surgical, family and social history reviewed and updated as indicated. Interim medical history since our last visit reviewed. Allergies and medications reviewed and updated.  Review of Systems  Constitutional:  Negative for chills and fever.  Eyes:  Negative for visual disturbance.  Respiratory:  Negative for shortness of breath and wheezing.   Cardiovascular:  Negative for chest pain and leg swelling.  Musculoskeletal:  Negative for back pain and gait problem.  Skin:  Negative for rash.  Neurological:  Negative for dizziness, weakness and light-headedness.  All other systems reviewed and are negative.   Per HPI unless specifically indicated above   Allergies as of 06/02/2022       Reactions   Fenofibrate Rash   Permament rash.        Medication List        Accurate as of Jun 02, 2022  3:22 PM. If you have any questions, ask your nurse or doctor.          Accu-Chek Guide w/Device Kit Test BS TID Dx E11.9   Accu-Chek Softclix Lancets lancets Test BS TID Dx E11.9   empagliflozin 25 MG Tabs tablet Commonly known as: Jardiance Take 1 tablet (25 mg total) by mouth daily before breakfast.   Fish Oil 1200 MG Cpdr Take 1,200 mg by mouth daily.   fluticasone 50 MCG/ACT nasal spray Commonly known as: FLONASE USE 1 SPRAY IN EACH NOSTRIL TWICE DAILY AS NEEDED FOR ALLERGIES OR RHINITIS (SUBSTITUTED FOR FLONASE)   glipiZIDE 5 MG 24 hr tablet  Commonly known as: GLUCOTROL XL TAKE 1 TABLET EVERY DAY WITH BREAKFAST   glucose blood test strip Test BS TID Dx E11.9   ibuprofen 600 MG tablet Commonly known as: ADVIL Take 1 tablet (600 mg total) by mouth every 8 (eight) hours as needed for mild pain or moderate pain.   ketoconazole 2 % cream Commonly known as: NIZORAL Apply 1 application topically daily.   lisinopril 20 MG tablet Commonly known as: ZESTRIL Take 1 tablet (20 mg total) by mouth  daily.   omeprazole 40 MG capsule Commonly known as: PRILOSEC TAKE 1 CAPSULE EVERY DAY   Oxycodone HCl 10 MG Tabs Take 1 tablet (10 mg total) by mouth 4 (four) times daily as needed. What changed: Another medication with the same name was changed. Make sure you understand how and when to take each. Changed by: Elige Radon Dora Clauss, MD   Oxycodone HCl 10 MG Tabs Take 1 tablet (10 mg total) by mouth 4 (four) times daily as needed. Start taking on: July 02, 2022 What changed: These instructions start on July 02, 2022. If you are unsure what to do until then, ask your doctor or other care provider. Changed by: Elige Radon Chantavia Bazzle, MD   Oxycodone HCl 10 MG Tabs Take 1 tablet (10 mg total) by mouth 4 (four) times daily as needed. Start taking on: August 01, 2022 What changed: These instructions start on August 01, 2022. If you are unsure what to do until then, ask your doctor or other care provider. Changed by: Nils Pyle, MD   simvastatin 40 MG tablet Commonly known as: ZOCOR Take 1 tablet (40 mg total) by mouth daily at 6 PM.   triamcinolone cream 0.1 % Commonly known as: KENALOG Apply 1 application topically 2 (two) times daily.   Trulicity 4.5 MG/0.5ML Sopn Generic drug: Dulaglutide Inject 4.5 mg as directed once a week.         Objective:   BP 112/72   Pulse 78   Ht 6\' 1"  (1.854 m)   Wt 250 lb (113.4 kg)   SpO2 97%   BMI 32.98 kg/m   Wt Readings from Last 3 Encounters:  06/02/22 250 lb (113.4 kg)  04/14/22 251 lb (113.9 kg)  03/24/22 251 lb 12.8 oz (114.2 kg)    Physical Exam Vitals and nursing note reviewed.  Constitutional:      General: He is not in acute distress.    Appearance: He is well-developed. He is not diaphoretic.  Eyes:     General: No scleral icterus.    Conjunctiva/sclera: Conjunctivae normal.  Neck:     Thyroid: No thyromegaly.  Cardiovascular:     Rate and Rhythm: Normal rate and regular rhythm.     Heart sounds: Normal heart sounds.  No murmur heard. Pulmonary:     Effort: Pulmonary effort is normal. No respiratory distress.     Breath sounds: Normal breath sounds. No wheezing.  Musculoskeletal:        General: No swelling. Normal range of motion.     Cervical back: Neck supple.     Comments: Deformity in both ankles  Lymphadenopathy:     Cervical: No cervical adenopathy.  Skin:    General: Skin is warm and dry.     Findings: No rash.  Neurological:     Mental Status: He is alert and oriented to person, place, and time.     Coordination: Coordination normal.  Psychiatric:        Behavior: Behavior normal.  Assessment & Plan:   Problem List Items Addressed This Visit       Cardiovascular and Mediastinum   Hypertension associated with diabetes (HCC)   Relevant Orders   CBC with Differential/Platelet   CMP14+EGFR   Lipid panel   Bayer DCA Hb A1c Waived   PSA, total and free     Endocrine   Diabetes mellitus, type 2 (HCC) - Primary   Relevant Orders   CBC with Differential/Platelet   CMP14+EGFR   Lipid panel   Bayer DCA Hb A1c Waived   PSA, total and free     Nervous and Auditory   Neuropathy     Other   Chronic foot pain   Relevant Medications   Oxycodone HCl 10 MG TABS (Start on 07/02/2022)   Oxycodone HCl 10 MG TABS (Start on 08/01/2022)   Oxycodone HCl 10 MG TABS   Mixed hyperlipidemia   Relevant Orders   CBC with Differential/Platelet   CMP14+EGFR   Lipid panel   Bayer DCA Hb A1c Waived   PSA, total and free  A1c 6.4.  He went to the endocrinologist and they showed him a picture of Fournier's gangrene with the Jardiance.  Continue current medicine.  Follow up plan: Return in about 3 months (around 09/02/2022), or if symptoms worsen or fail to improve, for Diabetes hypertension cholesterol.  Counseling provided for all of the vaccine components Orders Placed This Encounter  Procedures   CBC with Differential/Platelet   CMP14+EGFR   Lipid panel   Bayer DCA Hb A1c Waived    PSA, total and free    Arville Care, MD [facility] 06/02/2022, 3:22 PM

## 2022-06-03 LAB — PSA, TOTAL AND FREE
PSA, Free Pct: 62.5 %
PSA, Free: 0.25 ng/mL
Prostate Specific Ag, Serum: 0.4 ng/mL (ref 0.0–4.0)

## 2022-06-03 LAB — CMP14+EGFR
AST: 22 IU/L (ref 0–40)
Albumin/Globulin Ratio: 1.8 (ref 1.2–2.2)
Albumin: 4.5 g/dL (ref 3.8–4.9)
Alkaline Phosphatase: 122 IU/L — ABNORMAL HIGH (ref 44–121)
BUN/Creatinine Ratio: 21 — ABNORMAL HIGH (ref 9–20)
Bilirubin Total: 0.3 mg/dL (ref 0.0–1.2)
Calcium: 9.5 mg/dL (ref 8.7–10.2)
Chloride: 102 mmol/L (ref 96–106)
Creatinine, Ser: 1.17 mg/dL (ref 0.76–1.27)
Glucose: 122 mg/dL — ABNORMAL HIGH (ref 70–99)
Potassium: 4.5 mmol/L (ref 3.5–5.2)
Total Protein: 7 g/dL (ref 6.0–8.5)
eGFR: 73 mL/min/{1.73_m2} (ref >59)

## 2022-06-03 LAB — LIPID PANEL
Chol/HDL Ratio: 4 ratio (ref 0.0–5.0)
Cholesterol, Total: 137 mg/dL (ref 100–199)
HDL: 34 mg/dL — ABNORMAL LOW (ref >39)
LDL Chol Calc (NIH): 51 mg/dL (ref 0–99)
Triglycerides: 341 mg/dL — ABNORMAL HIGH (ref 0–149)
VLDL Cholesterol Cal: 52 mg/dL — ABNORMAL HIGH (ref 5–40)

## 2022-06-03 LAB — CBC WITH DIFFERENTIAL/PLATELET
Basos: 0 %
EOS (ABSOLUTE): 0.1 10*3/uL (ref 0.0–0.4)
Hematocrit: 48.3 % (ref 37.5–51.0)
Hemoglobin: 16.6 g/dL (ref 13.0–17.7)
Lymphocytes Absolute: 2.5 10*3/uL (ref 0.7–3.1)
Lymphs: 22 %
MCH: 31.7 pg (ref 26.6–33.0)
MCV: 92 fL (ref 79–97)
Monocytes: 10 %
Neutrophils: 67 %
Platelets: 243 10*3/uL (ref 150–450)
RBC: 5.23 x10E6/uL (ref 4.14–5.80)
RDW: 12.6 % (ref 11.6–15.4)
WBC: 11.1 10*3/uL — ABNORMAL HIGH (ref 3.4–10.8)

## 2022-06-21 ENCOUNTER — Encounter: Payer: Self-pay | Admitting: Family Medicine

## 2022-06-21 ENCOUNTER — Other Ambulatory Visit: Payer: Self-pay | Admitting: Family Medicine

## 2022-06-22 DIAGNOSIS — I2510 Atherosclerotic heart disease of native coronary artery without angina pectoris: Secondary | ICD-10-CM | POA: Insufficient documentation

## 2022-06-22 DIAGNOSIS — E785 Hyperlipidemia, unspecified: Secondary | ICD-10-CM | POA: Insufficient documentation

## 2022-06-22 NOTE — Progress Notes (Unsigned)
Cardiology Office Note   Date:  06/24/2022   ID:  Timothy Nelson, DOB [birth year], MRN [id number]  PCP:  Dettinger, Elige Radon, MD  Cardiologist:   Rollene Rotunda, MD Referring:  Dettinger, Elige Radon, MD  Chief Complaint  Patient presents with   Coronary Artery Disease      History of Present Illness: Timothy Nelson is a 57 y.o. male who presents for evaluation of elevated coronary calcium and shortness of breath. He saw Dr. Diona Browner in 2017.  He had SOB.  He had a negative nuclear study at that time.  He works as a Naval architect.  He does have shortness of breath doing things like pulling hoses of climbing up stairs.  However, he does not have chest pressure, neck or arm discomfort.  He does not have palpitations, presyncope or syncope.  He had no weight gain or edema.  Past Medical History:  Diagnosis Date   Arthritis    Barrett's esophagus    Chronic pain    Diabetes (HCC)    Essential hypertension    Gastroparesis    GERD (gastroesophageal reflux disease)    Hyperlipidemia    Obesity    Type 2 diabetes mellitus (HCC)     Past Surgical History:  Procedure Laterality Date   ESOPHAGOGASTRODUODENOSCOPY (EGD) WITH PROPOFOL N/A 04/04/2015   Procedure: ESOPHAGOGASTRODUODENOSCOPY (EGD) WITH PROPOFOL;  Surgeon: Malissa Hippo, MD;  Location: [facility];  Service: Endoscopy;  Laterality: N/A;  8:30   FRACTURE SURGERY  03/2005   Left arm, left thumb, bilateral legs   HIP SURGERY Right    WRIST FRACTURE SURGERY Left      Current Outpatient Medications  Medication Sig Dispense Refill   Accu-Chek Softclix Lancets lancets Test BS TID Dx E11.9 300 each 3   Blood Glucose Monitoring Suppl (ACCU-CHEK GUIDE) w/Device KIT Test BS TID Dx E11.9 1 kit 0   Dulaglutide (TRULICITY) 4.5 MG/0.5ML SOPN Inject 4.5 mg as directed once a week. 6 mL 3   empagliflozin (JARDIANCE) 25 MG TABS tablet Take 1 tablet (25 mg total) by mouth daily before breakfast. 90 tablet 1   fluticasone (FLONASE)  50 MCG/ACT nasal spray USE 1 SPRAY IN EACH NOSTRIL TWICE DAILY AS NEEDED FOR ALLERGIES OR RHINITIS (SUBSTITUTED FOR FLONASE) 48 g 3   glipiZIDE (GLUCOTROL XL) 5 MG 24 hr tablet TAKE 1 TABLET EVERY DAY WITH BREAKFAST 90 tablet 10   glucose blood test strip Test BS TID Dx E11.9 300 each 3   ibuprofen (ADVIL) 600 MG tablet Take 1 tablet (600 mg total) by mouth every 8 (eight) hours as needed for mild pain or moderate pain. 270 tablet 3   ketoconazole (NIZORAL) 2 % cream Apply 1 application topically daily. 60 g 2   lisinopril (ZESTRIL) 20 MG tablet Take 1 tablet (20 mg total) by mouth daily. 90 tablet 3   Omega-3 Fatty Acids (FISH OIL) 1200 MG CPDR Take 1,200 mg by mouth daily. 90 capsule 3   omeprazole (PRILOSEC) 40 MG capsule TAKE 1 CAPSULE EVERY DAY 90 capsule 0   [START ON 07/02/2022] Oxycodone HCl 10 MG TABS Take 1 tablet (10 mg total) by mouth 4 (four) times daily as needed. 120 tablet 0   [START ON 08/01/2022] Oxycodone HCl 10 MG TABS Take 1 tablet (10 mg total) by mouth 4 (four) times daily as needed. 120 tablet 0   Oxycodone HCl 10 MG TABS Take 1 tablet (10 mg total) by mouth 4 (four) times daily as  needed. 120 tablet 0   simvastatin (ZOCOR) 40 MG tablet Take 1 tablet (40 mg total) by mouth daily at 6 PM. 90 tablet 3   triamcinolone cream (KENALOG) 0.1 % Apply 1 application topically 2 (two) times daily. 454 g 1   No current facility-administered medications for this visit.    Allergies:   Fenofibrate    Social History:  The patient  reports that he quit smoking about 4 years ago. His smoking use included cigarettes and cigars. He has a 15.00 pack-year smoking history. He has never used smokeless tobacco. He reports current alcohol use of about 3.0 standard drinks of alcohol per week. He reports that he does not use drugs.   Family History:  The patient's family history includes Arthritis in his mother; Diabetes in his daughter, father, and mother; Esophageal cancer in his mother; Heart  attack in his father; Heart disease in his father; Hyperlipidemia in his mother; Hypertension in his mother and sister; Kidney disease in his father.    ROS:  Please see the history of present illness.   Otherwise, review of systems are positive for none.   All other systems are reviewed and negative.    PHYSICAL EXAM: VS:  BP 110/68 (BP Location: Left Arm, Patient Position: Sitting, Cuff Size: Normal)   Pulse 65   Ht 6\' 1"  (1.854 m)   Wt 252 lb (114.3 kg)   SpO2 98%   BMI 33.25 kg/m  , BMI Body mass index is 33.25 kg/m. GENERAL:  Well appearing HEENT:  Pupils equal round and reactive, fundi not visualized, oral mucosa unremarkable NECK:  No jugular venous distention, waveform within normal limits, carotid upstroke brisk and symmetric, no bruits, no thyromegaly LYMPHATICS:  No cervical, inguinal adenopathy LUNGS:  Clear to auscultation bilaterally BACK:  No CVA tenderness CHEST:  Unremarkable HEART:  PMI not displaced or sustained,S1 and S2 within normal limits, no S3, no S4, no clicks, no rubs, no murmurs ABD:  Flat, positive bowel sounds normal in frequency in pitch, no bruits, no rebound, no guarding, no midline pulsatile mass, no hepatomegaly, no splenomegaly EXT:  2 plus pulses throughout, no edema, no cyanosis no clubbing SKIN:  No rashes no nodules NEURO:  Cranial nerves II through XII grossly intact, motor grossly intact throughout [facility]:  Cognitively intact, oriented to person place and time    EKG:  EKG Interpretation  Date/Time:  Thursday June 24 2022 07:57:39 EDT Ventricular Rate:  65 PR Interval:  194 QRS Duration: 114 QT Interval:  382 QTC Calculation: 397 R Axis:   -14 Text Interpretation: Normal sinus rhythm Poor anterior R wave progression. No acute ST T wave changes Confirmed by Rollene Rotunda ([id number]) on 06/24/2022 8:11:46 AM     Recent Labs: 06/02/2022: ALT 28; BUN 25; Creatinine, Ser 1.17; Hemoglobin 16.6; Platelets 243; Potassium 4.5; Sodium 137     Lipid Panel    Component Value Date/Time   CHOL 137 06/02/2022 1448   TRIG 341 (H) 06/02/2022 1448   HDL 34 (L) 06/02/2022 1448   CHOLHDL 4.0 06/02/2022 1448   LDLCALC 51 06/02/2022 1448      Wt Readings from Last 3 Encounters:  06/24/22 252 lb (114.3 kg)  06/02/22 250 lb (113.4 kg)  04/14/22 251 lb (113.9 kg)      Other studies Reviewed: Additional studies/ records that were reviewed today include: Labs. Review of the above records demonstrates:  Please see elsewhere in the note.     ASSESSMENT AND PLAN:  CAD:  He is noted to have this on screening chest CT. he does have shortness of breath.  I am going to screen him with a stress perfusion study.  Further evaluation will be based on these results.   Hyperlipidemia : Recent LDL was 51 with an HDL 34.  No change in therapy.  We did talk about diet and he said "I am a carnivore."  He does not plan on moving forward with a plant-based.   Hypertension : His pressure is well-controlled.  No change in therapy.   Type 2 diabetes mellitus : A1c is improved from previous.  It is down to 6.4.  It was 7.0 most recently.  Being managed expertly by his primary provider.   Obesity: We did again have a conversation about diet.   Current medicines are reviewed at length with the patient today.  The patient does not have concerns regarding medicines.  The following changes have been made:  no change  Labs/ tests ordered today include:   Orders Placed This Encounter  Procedures   MYOCARDIAL PERFUSION IMAGING   EKG 12-Lead     Disposition:   FU with me as needed based on results of the above   Signed, Rollene Rotunda, MD  06/24/2022 8:38 AM    [facility]

## 2022-06-24 ENCOUNTER — Telehealth (HOSPITAL_COMMUNITY): Payer: Self-pay | Admitting: *Deleted

## 2022-06-24 ENCOUNTER — Ambulatory Visit: Payer: Medicare HMO | Attending: Cardiology | Admitting: Cardiology

## 2022-06-24 ENCOUNTER — Encounter: Payer: Self-pay | Admitting: Cardiology

## 2022-06-24 VITALS — BP 110/68 | HR 65 | Ht 73.0 in | Wt 252.0 lb

## 2022-06-24 DIAGNOSIS — I10 Essential (primary) hypertension: Secondary | ICD-10-CM | POA: Diagnosis not present

## 2022-06-24 DIAGNOSIS — Z7984 Long term (current) use of oral hypoglycemic drugs: Secondary | ICD-10-CM

## 2022-06-24 DIAGNOSIS — E785 Hyperlipidemia, unspecified: Secondary | ICD-10-CM

## 2022-06-24 DIAGNOSIS — I2510 Atherosclerotic heart disease of native coronary artery without angina pectoris: Secondary | ICD-10-CM | POA: Diagnosis not present

## 2022-06-24 DIAGNOSIS — E118 Type 2 diabetes mellitus with unspecified complications: Secondary | ICD-10-CM | POA: Diagnosis not present

## 2022-06-24 NOTE — Patient Instructions (Signed)
Medication Instructions:  Your physician recommends that you continue on your current medications as directed. Please refer to the Current Medication list given to you today.  *If you need a refill on your cardiac medications before your next appointment, please call your pharmacy*   Testing/Procedures: Your physician has requested that you have en exercise stress myoview. For further information please visit [email or web address] Please follow instruction sheet, as given.  Follow-Up: At [facility], you and your health needs are our priority.  As part of our continuing mission to provide you with exceptional heart care, we have created designated Provider Care Teams.  These Care Teams include your primary Cardiologist (physician) and Advanced Practice Providers (APPs -  Physician Assistants and Nurse Practitioners) who all work together to provide you with the care you need, when you need it.   Your next appointment:   As needed with Dr. Antoine Poche

## 2022-06-24 NOTE — Telephone Encounter (Signed)
Reached pt and gave detailed instructions for MPI study.

## 2022-06-25 ENCOUNTER — Encounter: Payer: Self-pay | Admitting: Nurse Practitioner

## 2022-06-25 ENCOUNTER — Ambulatory Visit: Payer: Medicare HMO | Admitting: Nurse Practitioner

## 2022-06-25 VITALS — BP 110/72 | HR 64 | Ht 73.0 in | Wt 254.2 lb

## 2022-06-25 DIAGNOSIS — Z7984 Long term (current) use of oral hypoglycemic drugs: Secondary | ICD-10-CM

## 2022-06-25 DIAGNOSIS — E1165 Type 2 diabetes mellitus with hyperglycemia: Secondary | ICD-10-CM | POA: Diagnosis not present

## 2022-06-25 DIAGNOSIS — I10 Essential (primary) hypertension: Secondary | ICD-10-CM

## 2022-06-25 DIAGNOSIS — E782 Mixed hyperlipidemia: Secondary | ICD-10-CM | POA: Diagnosis not present

## 2022-06-25 DIAGNOSIS — E1169 Type 2 diabetes mellitus with other specified complication: Secondary | ICD-10-CM

## 2022-06-25 DIAGNOSIS — Z7985 Long-term (current) use of injectable non-insulin antidiabetic drugs: Secondary | ICD-10-CM

## 2022-06-25 LAB — POCT UA - MICROALBUMIN
Creatinine, POC: 50 mg/dL
Microalbumin Ur, POC: 30 mg/L

## 2022-06-25 MED ORDER — TRULICITY 4.5 MG/0.5ML ~~LOC~~ SOAJ: 4.5000 mg | SUBCUTANEOUS | 3 refills | Status: DC

## 2022-06-25 MED ORDER — EMPAGLIFLOZIN 25 MG PO TABS: 25.0000 mg | ORAL_TABLET | Freq: Every day | ORAL | 1 refills | Status: DC

## 2022-06-25 MED ORDER — GLIPIZIDE ER 5 MG PO TB24: 5.0000 mg | ORAL_TABLET | Freq: Every day | ORAL | 3 refills | Status: DC

## 2022-06-25 NOTE — Progress Notes (Signed)
06/25/2022, 8:23 AM   Endocrinology follow-up note   Subjective:    Patient ID: Timothy Nelson, male    DOB: 1965/03/23.  Timothy Nelson is being seen in follow-up after he was seen in consultation for management of currently uncontrolled symptomatic diabetes requested by  Dettinger, Elige Radon, MD.   Past Medical History:  Diagnosis Date   Arthritis    Barrett's esophagus    Chronic pain    Diabetes (HCC)    Essential hypertension    Gastroparesis    GERD (gastroesophageal reflux disease)    Hyperlipidemia    Obesity    Type 2 diabetes mellitus (HCC)     Past Surgical History:  Procedure Laterality Date   ESOPHAGOGASTRODUODENOSCOPY (EGD) WITH PROPOFOL N/A 04/04/2015   Procedure: ESOPHAGOGASTRODUODENOSCOPY (EGD) WITH PROPOFOL;  Surgeon: Malissa Hippo, MD;  Location: [facility];  Service: Endoscopy;  Laterality: N/A;  8:30   FRACTURE SURGERY  03/2005   Left arm, left thumb, bilateral legs   HIP SURGERY Right    WRIST FRACTURE SURGERY Left     Social History   Socioeconomic History   Marital status: Married    Spouse name: Not on file   Number of children: 1   Years of education: Not on file   Highest education level: Associate degree: occupational, Scientist, product/process development, or vocational program  Occupational History   Occupation: Disability  Tobacco Use   Smoking status: Former    Packs/day: 1.00    Years: 15.00    Additional pack years: 0.00    Total pack years: 15.00    Types: Cigarettes, Cigars    Quit date: 01/03/2018    Years since quitting: 4.4   Smokeless tobacco: Never   Tobacco comments:    Smoked intermittently for 20 years; 1-2 cigars weekly as of 02/07/2018  Vaping Use   Vaping Use: Never used  Substance and Sexual Activity   Alcohol use: Yes    Alcohol/week: 3.0 standard drinks of alcohol    Types: 3 Cans of beer per week    Comment: glass wine a week.    Drug use: No   Sexual activity: Yes     Birth control/protection: None  Other Topics Concern   Not on file  Social History Narrative   Restaurant manager, fast food.     Social Determinants of Health   Financial Resource Strain: Low Risk  (05/31/2022)   Overall Financial Resource Strain (CARDIA)    Difficulty of Paying Living Expenses: Not hard at all  Food Insecurity: No Food Insecurity (05/31/2022)   Hunger Vital Sign    Worried About Running Out of Food in the Last Year: Never true    Ran Out of Food in the Last Year: Never true  Transportation Needs: No Transportation Needs (05/31/2022)   PRAPARE - Administrator, Civil Service (Medical): No    Lack of Transportation (Non-Medical): No  Physical Activity: Insufficiently Active (05/31/2022)   Exercise Vital Sign    Days of Exercise per Week: 2 days    Minutes of Exercise per Session: 20 min  Stress: No Stress Concern Present (05/31/2022)   [employer] of Occupational Health - Occupational Stress Questionnaire  Feeling of Stress : Not at all  Social Connections: Moderately Integrated (05/31/2022)   Social Connection and Isolation Panel [NHANES]    Frequency of Communication with Friends and Family: Once a week    Frequency of Social Gatherings with Friends and Family: Once a week    Attends Religious Services: 1 to 4 times per year    Active Member of [employer] or Organizations: Yes    Attends Engineer, structural: More than 4 times per year    Marital Status: Married    Family History  Problem Relation Age of Onset   Diabetes Mother    Hypertension Mother    Hyperlipidemia Mother    Arthritis Mother    Esophageal cancer Mother        patient unsure   Diabetes Father    Heart disease Father        Age 67   Kidney disease Father    Heart attack Father        Quadruple bypass at age 7   Hypertension Sister    Diabetes Daughter    Colon cancer Neg Hx    Stomach cancer Neg Hx    Pancreatic cancer Neg Hx    Liver disease Neg Hx      Outpatient Encounter Medications as of 06/25/2022  Medication Sig   Accu-Chek Softclix Lancets lancets Test BS TID Dx E11.9   Blood Glucose Monitoring Suppl (ACCU-CHEK GUIDE) w/Device KIT Test BS TID Dx E11.9   fluticasone (FLONASE) 50 MCG/ACT nasal spray USE 1 SPRAY IN EACH NOSTRIL TWICE DAILY AS NEEDED FOR ALLERGIES OR RHINITIS (SUBSTITUTED FOR FLONASE)   glucose blood test strip Test BS TID Dx E11.9   ibuprofen (ADVIL) 600 MG tablet Take 1 tablet (600 mg total) by mouth every 8 (eight) hours as needed for mild pain or moderate pain.   ketoconazole (NIZORAL) 2 % cream Apply 1 application topically daily.   lisinopril (ZESTRIL) 20 MG tablet Take 1 tablet (20 mg total) by mouth daily.   Omega-3 Fatty Acids (FISH OIL) 1200 MG CPDR Take 1,200 mg by mouth daily.   omeprazole (PRILOSEC) 40 MG capsule TAKE 1 CAPSULE EVERY DAY   [START ON 07/02/2022] Oxycodone HCl 10 MG TABS Take 1 tablet (10 mg total) by mouth 4 (four) times daily as needed.   [START ON 08/01/2022] Oxycodone HCl 10 MG TABS Take 1 tablet (10 mg total) by mouth 4 (four) times daily as needed.   Oxycodone HCl 10 MG TABS Take 1 tablet (10 mg total) by mouth 4 (four) times daily as needed.   simvastatin (ZOCOR) 40 MG tablet Take 1 tablet (40 mg total) by mouth daily at 6 PM.   triamcinolone cream (KENALOG) 0.1 % Apply 1 application topically 2 (two) times daily.   [DISCONTINUED] Dulaglutide (TRULICITY) 4.5 MG/0.5ML SOPN Inject 4.5 mg as directed once a week.   [DISCONTINUED] empagliflozin (JARDIANCE) 25 MG TABS tablet Take 1 tablet (25 mg total) by mouth daily before breakfast.   [DISCONTINUED] glipiZIDE (GLUCOTROL XL) 5 MG 24 hr tablet TAKE 1 TABLET EVERY DAY WITH BREAKFAST   Dulaglutide (TRULICITY) 4.5 MG/0.5ML SOPN Inject 4.5 mg as directed once a week.   empagliflozin (JARDIANCE) 25 MG TABS tablet Take 1 tablet (25 mg total) by mouth daily before breakfast.   glipiZIDE (GLUCOTROL XL) 5 MG 24 hr tablet Take 1 tablet (5 mg total) by  mouth daily with breakfast.   No facility-administered encounter medications on file as of 06/25/2022.  ALLERGIES: Allergies  Allergen Reactions   Fenofibrate Rash    Permament rash.     VACCINATION STATUS: Immunization History  Administered Date(s) Administered   Influenza,inj,Quad PF,6+ Mos 11/15/2018   Influenza-Unspecified 09/04/2016   Moderna Covid-19 Vaccine Bivalent Booster 10yrs & up 02/14/2021   Moderna Sars-Covid-2 Vaccination 03/13/2019, 04/10/2019, 11/06/2019, 06/19/2020   Pneumococcal Polysaccharide-23 09/16/2016   Tdap 04/05/2011, 12/04/2021   Zoster Recombinat (Shingrix) 09/03/2020, 03/04/2021    Diabetes He presents for his follow-up diabetic visit. He has type 2 diabetes mellitus. Onset time: He was diagnosed at approximate age of 40 years. His disease course has been improving. There are no hypoglycemic associated symptoms. Pertinent negatives for hypoglycemia include no confusion, headaches, pallor or seizures. Pertinent negatives for diabetes include no chest pain, no fatigue, no polydipsia, no polyphagia, no polyuria, no weakness and no weight loss. There are no hypoglycemic complications. Symptoms are stable. Diabetic complications include nephropathy (mild). Risk factors for coronary artery disease include diabetes mellitus, dyslipidemia, family history, obesity, male sex, hypertension, sedentary lifestyle and tobacco exposure. Current diabetic treatment includes oral agent (triple therapy) and oral agent (dual therapy) (and Trulicity). He is compliant with treatment most of the time. His weight is fluctuating minimally. He is following a generally unhealthy diet. When asked about meal planning, he reported none. He has not had a previous visit with a dietitian. He rarely participates in exercise. His home blood glucose trend is decreasing steadily. (He presents today with no logs or meter to review.  His most recent A1c, checked by PCP on 06/02/22 was 6.4%, improving  from last visit of 8.5%.  He denies any hypoglycemia.  He notes he did have trouble getting his Trulicity at one point due to backorder but just recently received a shipment.) An ACE inhibitor/angiotensin II receptor blocker is being taken. He does not see a podiatrist.Eye exam is current.  Hyperlipidemia This is a chronic problem. The current episode started more than 1 year ago. The problem is controlled. Recent lipid tests were reviewed and are normal. Exacerbating diseases include chronic renal disease, diabetes and obesity. Factors aggravating his hyperlipidemia include fatty foods. Pertinent negatives include no chest pain, myalgias or shortness of breath. Current antihyperlipidemic treatment includes statins. The current treatment provides moderate improvement of lipids. Compliance problems include adherence to diet and adherence to exercise.  Risk factors for coronary artery disease include dyslipidemia, diabetes mellitus, family history, hypertension, male sex, obesity and a sedentary lifestyle.  Hypertension This is a chronic problem. The current episode started more than 1 year ago. The problem has been resolved since onset. The problem is controlled. Pertinent negatives include no chest pain, headaches, neck pain, palpitations or shortness of breath. There are no associated agents to hypertension. Risk factors for coronary artery disease include diabetes mellitus, dyslipidemia, male gender, obesity, sedentary lifestyle, family history and smoking/tobacco exposure. Past treatments include ACE inhibitors. The current treatment provides moderate improvement. There are no compliance problems.  Hypertensive end-organ damage includes kidney disease. Identifiable causes of hypertension include chronic renal disease.    Review of systems  Constitutional: + minimally fluctuating body weight,  current Body mass index is 33.54 kg/m. , no fatigue, no subjective hyperthermia, no subjective  hypothermia Eyes: no blurry vision, no xerophthalmia ENT: no sore throat, no nodules palpated in throat, no dysphagia/odynophagia, no hoarseness Cardiovascular: no chest pain, no shortness of breath, no palpitations, no leg swelling Respiratory: no cough, no shortness of breath Gastrointestinal: no nausea/vomiting/diarrhea Musculoskeletal: no muscle/joint aches Skin: no rashes, no  hyperemia Neurological: no tremors, no numbness, no tingling, no dizziness Psychiatric: no depression, no anxiety   Objective:    BP 110/72 (BP Location: Left Arm, Patient Position: Sitting, Cuff Size: Large)   Pulse 64   Ht 6\' 1"  (1.854 m)   Wt 254 lb 3.2 oz (115.3 kg)   BMI 33.54 kg/m   Wt Readings from Last 3 Encounters:  06/25/22 254 lb 3.2 oz (115.3 kg)  06/24/22 252 lb (114.3 kg)  06/02/22 250 lb (113.4 kg)    BP Readings from Last 3 Encounters:  06/25/22 110/72  06/24/22 110/68  06/02/22 112/72     Physical Exam- Limited  Constitutional:  Body mass index is 33.54 kg/m. , not in acute distress, normal state of mind Eyes:  EOMI, no exophthalmos Musculoskeletal: no gross deformities, strength intact in all four extremities, no gross restriction of joint movements Skin:  no rashes, no hyperemia Neurological: no tremor with outstretched hands   Diabetic Foot Exam - Simple   No data filed     CMP ( most recent) CMP     Component Value Date/Time   NA 137 06/02/2022 1448   K 4.5 06/02/2022 1448   CL 102 06/02/2022 1448   CO2 21 06/02/2022 1448   GLUCOSE 122 (H) 06/02/2022 1448   GLUCOSE 208 (H) 04/03/2015 1500   BUN 25 (H) 06/02/2022 1448   CREATININE 1.17 06/02/2022 1448   CALCIUM 9.5 06/02/2022 1448   PROT 7.0 06/02/2022 1448   ALBUMIN 4.5 06/02/2022 1448   AST 22 06/02/2022 1448   ALT 28 06/02/2022 1448   ALKPHOS 122 (H) 06/02/2022 1448   BILITOT 0.3 06/02/2022 1448   GFRNONAA 66 01/18/2020 0842   GFRAA 76 01/18/2020 0842   Diabetic Labs (most recent): Lab Results   Component Value Date   HGBA1C 6.4 (H) 06/02/2022   HGBA1C 8.5 (H) 03/03/2022   HGBA1C 7.1 (H) 12/04/2021   MICROALBUR 30 mg/L 06/25/2022   MICROALBUR 80 03/23/2021   MICROALBUR 11.7 05/08/2019     Lipid Panel ( most recent) Lipid Panel     Component Value Date/Time   CHOL 137 06/02/2022 1448   TRIG 341 (H) 06/02/2022 1448   HDL 34 (L) 06/02/2022 1448   CHOLHDL 4.0 06/02/2022 1448   LDLCALC 51 06/02/2022 1448   LABVLDL 52 (H) 06/02/2022 1448     Lab Results  Component Value Date   TSH 4.170 09/03/2020   TSH 2.010 05/08/2019   TSH 2.13 02/07/2018   TSH 3.510 09/16/2016   TSH 1.270 09/11/2014   FREET4 1.38 09/03/2020   FREET4 1.48 05/08/2019     Assessment & Plan:   1) Controlled type 2 diabetes mellitus without complication  - Patton Rabinovich has currently uncontrolled symptomatic type 2 DM since 57 years of age.  He presents today with no logs or meter to review.  His most recent A1c, checked by PCP on 06/02/22 was 6.4%, improving from last visit of 8.5%.  He denies any hypoglycemia.  He notes he did have trouble getting his Trulicity at one point due to backorder but just recently received a shipment.  -Recent labs reviewed.    - I had a long discussion with him about the progressive nature of diabetes and the pathology behind its complications. -his diabetes is complicated by obesity/sedentary life, history of smoking and he remains at a high risk for more acute and chronic complications which include CAD, CVA, CKD, retinopathy, and neuropathy. These are all discussed in detail with him.  -  Nutritional counseling repeated at each appointment due to patients tendency to fall back in to old habits.  - The patient admits there is a room for improvement in their diet and drink choices. -  Suggestion is made for the patient to avoid simple carbohydrates from their diet including Cakes, Sweet Desserts / Pastries, Ice Cream, Soda (diet and regular), Sweet Tea, Candies, Chips,  Cookies, Sweet Pastries, Store Bought Juices, Alcohol in Excess of 1-2 drinks a day, Artificial Sweeteners, Coffee Creamer, and "Sugar-free" Products. This will help patient to have stable blood glucose profile and potentially avoid unintended weight gain.   - I encouraged the patient to switch to unprocessed or minimally processed complex starch and increased protein intake (animal or plant source), fruits, and vegetables.   - Patient is advised to stick to a routine mealtimes to eat 3 meals a day and avoid unnecessary snacks (to snack only to correct hypoglycemia).  - he has seen Norm Salt, RDN, CDE for diabetes education.  - I have approached him with the following individualized plan to manage his diabetes and patient agrees:   -He is advised to continue his current medications including Trulicity 4.5 mg SQ weekly, Glipizide 5 mg XL daily with breakfast, and Jardiance 25 mg po daily for now.    He is encouraged to continue monitoring blood glucose at least once daily (3 times per week), before breakfast and notify the clinic if glucose levels are less than 70 or greater than 200 for 3 tests in a row.  - Specific targets for  A1c;  LDL, HDL, Triglycerides, were discussed with the patient.  2) Blood Pressure /Hypertension:  His blood pressure is controlled to target.  He is advised to continue Lisinopril 20 mg po daily.    3) Lipids/Hyperlipidemia:  His recent lipid panel on 06/02/22 shows controlled LDL at 61 and elevated triglycerides of 341.  He is advised to continue Simvastatin 40 mg po daily at bedtime and Fish Oil 1200 mg po daily. Side effects and precautions discussed with him. He is advised to avoid fried foods and butter.    4)  Weight/Diet: His Body mass index is 33.54 kg/m.--  clearly complicating his diabetes care.  He is a candidate for modest weight loss.  I discussed with him the fact that loss of 5 - 10% of his  current body weight will have the most impact on his  diabetes management.  Exercise, and detailed carbohydrates information provided  -  detailed on discharge instructions.  5) Chronic Care/Health Maintenance: -he is on ACEI/ARB and Statin medications and is encouraged to initiate and continue to follow up with Ophthalmology, Dentist,  Podiatrist at least yearly or according to recommendations, and advised to stay away from smoking.  He is safe he does not believe in flu shots,  I have recommended yearly flu vaccine and pneumonia vaccine at least every 5 years; moderate intensity exercise for up to 150 minutes weekly; and  sleep for at least 7 hours a day.  - he is advised to maintain close follow up with Dettinger, Elige Radon, MD for primary care needs, as well as his other providers for optimal and coordinated care.     I spent  36  minutes in the care of the patient today including review of labs from CMP, Lipids, Thyroid Function, Hematology (current and previous including abstractions from other facilities); face-to-face time discussing  his blood glucose readings/logs, discussing hypoglycemia and hyperglycemia episodes and symptoms, medications doses, his options  of short and long term treatment based on the latest standards of care / guidelines;  discussion about incorporating lifestyle medicine;  and documenting the encounter. Risk reduction counseling performed per USPSTF guidelines to reduce obesity and cardiovascular risk factors.     Please refer to Patient Instructions for Blood Glucose Monitoring and Insulin/Medications Dosing Guide"  in media tab for additional information. Please  also refer to " Patient Self Inventory" in the Media  tab for reviewed elements of pertinent patient history.  Nicholes Stairs participated in the discussions, expressed understanding, and voiced agreement with the above plans.  All questions were answered to his satisfaction. he is encouraged to contact clinic should he have any questions or concerns prior to his  return visit.    Follow up plan: - Return in about 4 months (around 10/25/2022) for Diabetes F/U with A1c in office, No previsit labs, Bring meter and logs.  Ronny Bacon, [facility] [address] [place], [place] [address] Phone: [phone] Fax: [phone]  06/25/2022, 8:23 AM

## 2022-06-29 ENCOUNTER — Encounter: Payer: Self-pay | Admitting: *Deleted

## 2022-06-29 DIAGNOSIS — I2510 Atherosclerotic heart disease of native coronary artery without angina pectoris: Secondary | ICD-10-CM

## 2022-07-01 ENCOUNTER — Ambulatory Visit (HOSPITAL_COMMUNITY): Payer: Medicare HMO | Attending: Cardiology

## 2022-07-01 DIAGNOSIS — I2510 Atherosclerotic heart disease of native coronary artery without angina pectoris: Secondary | ICD-10-CM

## 2022-07-01 DIAGNOSIS — E785 Hyperlipidemia, unspecified: Secondary | ICD-10-CM | POA: Diagnosis not present

## 2022-07-01 DIAGNOSIS — E118 Type 2 diabetes mellitus with unspecified complications: Secondary | ICD-10-CM | POA: Diagnosis not present

## 2022-07-01 DIAGNOSIS — Z7984 Long term (current) use of oral hypoglycemic drugs: Secondary | ICD-10-CM | POA: Diagnosis not present

## 2022-07-01 DIAGNOSIS — I10 Essential (primary) hypertension: Secondary | ICD-10-CM

## 2022-07-01 LAB — MYOCARDIAL PERFUSION IMAGING
Angina Index: 0
Duke Treadmill Score: 8
Estimated workload: 10.1
Exercise duration (min): 8 min
Exercise duration (sec): 15 s
LV dias vol: 82 mL (ref 62–150)
LV sys vol: 36 mL
MPHR: 164 {beats}/min
Nuc Stress EF: 56 %
Peak HR: 160 {beats}/min
Percent HR: 97 %
Rest HR: 73 {beats}/min
Rest Nuclear Isotope Dose: 10.8 mCi
SDS: 1
SRS: 0
SSS: 1
ST Depression (mm): 0 mm
Stress Nuclear Isotope Dose: 32.6 mCi
TID: 0.84

## 2022-07-01 MED ORDER — TECHNETIUM TC 99M TETROFOSMIN IV KIT
10.8000 | PACK | Freq: Once | INTRAVENOUS | Status: AC | PRN
  Administered 2022-07-01: 10.8 via INTRAVENOUS

## 2022-07-01 MED ORDER — TECHNETIUM TC 99M TETROFOSMIN IV KIT
32.6000 | PACK | Freq: Once | INTRAVENOUS | Status: AC | PRN
  Administered 2022-07-01: 32.6 via INTRAVENOUS

## 2022-07-13 DIAGNOSIS — K2270 Barrett's esophagus without dysplasia: Secondary | ICD-10-CM | POA: Diagnosis not present

## 2022-07-23 ENCOUNTER — Encounter: Payer: Self-pay | Admitting: Family Medicine

## 2022-07-23 DIAGNOSIS — E119 Type 2 diabetes mellitus without complications: Secondary | ICD-10-CM | POA: Diagnosis not present

## 2022-07-23 DIAGNOSIS — K2270 Barrett's esophagus without dysplasia: Secondary | ICD-10-CM | POA: Diagnosis not present

## 2022-08-03 NOTE — Telephone Encounter (Signed)
This encounter was created in error - please disregard.

## 2022-08-09 ENCOUNTER — Telehealth: Payer: Self-pay | Admitting: Family Medicine

## 2022-08-09 ENCOUNTER — Other Ambulatory Visit: Payer: Self-pay | Admitting: Family Medicine

## 2022-08-09 ENCOUNTER — Encounter: Payer: Self-pay | Admitting: *Deleted

## 2022-08-09 DIAGNOSIS — G8929 Other chronic pain: Secondary | ICD-10-CM

## 2022-08-09 MED ORDER — OXYCODONE HCL 10 MG PO TABS
10.0000 mg | ORAL_TABLET | Freq: Four times a day (QID) | ORAL | 0 refills | Status: DC | PRN
Start: 2022-08-09 — End: 2022-09-13

## 2022-08-09 NOTE — Telephone Encounter (Signed)
Sent in a 1 month prescription for him.

## 2022-08-09 NOTE — Telephone Encounter (Signed)
Pt made aware

## 2022-08-17 ENCOUNTER — Other Ambulatory Visit: Payer: Self-pay | Admitting: Nurse Practitioner

## 2022-08-17 ENCOUNTER — Encounter: Payer: Self-pay | Admitting: Family Medicine

## 2022-08-17 DIAGNOSIS — E1169 Type 2 diabetes mellitus with other specified complication: Secondary | ICD-10-CM

## 2022-08-17 MED ORDER — ACCU-CHEK SOFTCLIX LANCETS MISC
3 refills | Status: AC
Start: 2022-08-17 — End: ?

## 2022-08-17 MED ORDER — ACCU-CHEK GUIDE W/DEVICE KIT: PACK | 0 refills | Status: AC

## 2022-08-18 ENCOUNTER — Ambulatory Visit: Payer: Medicare HMO | Admitting: Cardiology

## 2022-08-18 ENCOUNTER — Encounter: Payer: Self-pay | Admitting: Cardiology

## 2022-08-18 VITALS — BP 120/86 | HR 80 | Ht 73.0 in | Wt 256.0 lb

## 2022-08-18 DIAGNOSIS — R0602 Shortness of breath: Secondary | ICD-10-CM

## 2022-08-18 NOTE — Patient Instructions (Signed)
Medication Instructions:  The current medical regimen is effective;  continue present plan and medications.  *If you need a refill on your cardiac medications before your next appointment, please call your pharmacy*  Follow-Up: At [facility], you and your health needs are our priority.  As part of our continuing mission to provide you with exceptional heart care, we have created designated Provider Care Teams.  These Care Teams include your primary Cardiologist (physician) and Advanced Practice Providers (APPs -  Physician Assistants and Nurse Practitioners) who all work together to provide you with the care you need, when you need it.  We recommend signing up for the patient portal called "MyChart".  Sign up information is provided on this After Visit Summary.  MyChart is used to connect with patients for Virtual Visits (Telemedicine).  Patients are able to view lab/test results, encounter notes, upcoming appointments, etc.  Non-urgent messages can be sent to your provider as well.   To learn more about what you can do with MyChart, go to [email or web address].    Your next appointment:   Follow up as needed.  

## 2022-08-18 NOTE — Progress Notes (Signed)
  Cardiology Office Note:   Date:  08/18/2022  ID:  Timothy Nelson, DOB September 01, 1965, MRN [id number] PCP: Dettinger, Elige Radon, MD  [facility] Providers Cardiologist:  Rollene Rotunda, MD {  History of Present Illness:   Timothy Nelson is a 57 y.o. male elevated coronary calcium and shortness of breath.  I sent him for a perfusion study.  This was low risk.  He was able to exercise for 8 minutes and 15 seconds.  However, there was a very small area of ischemia in the mid inferior wall.  I did want to bring him back in him about any symptoms.  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  He said that actually this has to be quite a bit of exertion before you get a little short of breath and he thinks this is related to weight.  He said he can swim in the pool which she has done all summer.  He can do work around his large yard.  He does not bring on any shortness of breath.  He denies any chest pressure, neck or arm discomfort.  He has no palpitations, presyncope or syncope.  Denies any PND or orthopnea.   ROS: As stated in the HPI and negative for all other systems.  Studies Reviewed:    EKG:   NA  Risk Assessment/Calculations:      Physical Exam:   VS:  BP 120/86   Pulse 80   Ht 6\' 1"  (1.854 m)   Wt 256 lb (116.1 kg)   BMI 33.78 kg/m    Wt Readings from Last 3 Encounters:  08/18/22 256 lb (116.1 kg)  07/01/22 254 lb (115.2 kg)  06/25/22 254 lb 3.2 oz (115.3 kg)     GEN: Well nourished, well developed in no acute distress NECK: No JVD; No carotid bruits CARDIAC: RRR, no murmurs, rubs, gallops RESPIRATORY:  Clear to auscultation without rales, wheezing or rhonchi  ABDOMEN: Soft, non-tender, non-distended EXTREMITIES:  No edema; No deformity   ASSESSMENT AND PLAN:   CAD:   The patient has no new sypmtoms.  No further cardiovascular testing is indicated.  We will continue with aggressive risk  reduction and meds as listed.   Hyperlipidemia : Recent LDL was at target.  No change in therapy.   Hypertension : His pressure is controlled.  No change in therapy.   Type 2 diabetes mellitus : A1c is 6.4 down from 7.0.  No change in therapy.         Follow up with me as needed.  Signed, Rollene Rotunda, MD

## 2022-08-19 ENCOUNTER — Other Ambulatory Visit: Payer: Self-pay | Admitting: *Deleted

## 2022-08-19 DIAGNOSIS — G8929 Other chronic pain: Secondary | ICD-10-CM

## 2022-08-19 MED ORDER — IBUPROFEN 600 MG PO TABS
600.0000 mg | ORAL_TABLET | Freq: Three times a day (TID) | ORAL | 0 refills | Status: DC | PRN
Start: 2022-08-19 — End: 2022-12-24

## 2022-08-23 ENCOUNTER — Other Ambulatory Visit: Payer: Self-pay

## 2022-08-23 DIAGNOSIS — E1169 Type 2 diabetes mellitus with other specified complication: Secondary | ICD-10-CM

## 2022-08-23 MED ORDER — GLUCOSE BLOOD VI STRP
ORAL_STRIP | 3 refills | Status: AC
Start: 2022-08-23 — End: ?

## 2022-09-02 ENCOUNTER — Other Ambulatory Visit: Payer: Self-pay

## 2022-09-02 MED ORDER — TRULICITY 4.5 MG/0.5ML ~~LOC~~ SOAJ: 4.5000 mg | SUBCUTANEOUS | 1 refills | Status: DC

## 2022-09-03 ENCOUNTER — Ambulatory Visit: Payer: Medicare HMO | Admitting: Family Medicine

## 2022-09-13 ENCOUNTER — Ambulatory Visit (INDEPENDENT_AMBULATORY_CARE_PROVIDER_SITE_OTHER): Payer: Medicare HMO | Admitting: Family Medicine

## 2022-09-13 ENCOUNTER — Encounter: Payer: Self-pay | Admitting: Family Medicine

## 2022-09-13 VITALS — BP 124/79 | HR 77 | Ht 73.0 in | Wt 260.0 lb

## 2022-09-13 DIAGNOSIS — I152 Hypertension secondary to endocrine disorders: Secondary | ICD-10-CM

## 2022-09-13 DIAGNOSIS — Z7984 Long term (current) use of oral hypoglycemic drugs: Secondary | ICD-10-CM

## 2022-09-13 DIAGNOSIS — E1159 Type 2 diabetes mellitus with other circulatory complications: Secondary | ICD-10-CM | POA: Diagnosis not present

## 2022-09-13 DIAGNOSIS — M79671 Pain in right foot: Secondary | ICD-10-CM | POA: Diagnosis not present

## 2022-09-13 DIAGNOSIS — E1169 Type 2 diabetes mellitus with other specified complication: Secondary | ICD-10-CM

## 2022-09-13 DIAGNOSIS — G8929 Other chronic pain: Secondary | ICD-10-CM

## 2022-09-13 DIAGNOSIS — E782 Mixed hyperlipidemia: Secondary | ICD-10-CM

## 2022-09-13 LAB — CBC WITH DIFFERENTIAL/PLATELET
Basophils Absolute: 0.1 10*3/uL (ref 0.0–0.2)
Basos: 1 %
EOS (ABSOLUTE): 0.2 10*3/uL (ref 0.0–0.4)
Eos: 2 %
Hematocrit: 49.4 % (ref 37.5–51.0)
Hemoglobin: 16.2 g/dL (ref 13.0–17.7)
Immature Grans (Abs): 0 10*3/uL (ref 0.0–0.1)
Immature Granulocytes: 0 %
Lymphocytes Absolute: 1.8 10*3/uL (ref 0.7–3.1)
Lymphs: 26 %
MCH: 31.6 pg (ref 26.6–33.0)
MCHC: 32.8 g/dL (ref 31.5–35.7)
MCV: 97 fL (ref 79–97)
Monocytes Absolute: 0.7 10*3/uL (ref 0.1–0.9)
Monocytes: 11 %
Neutrophils Absolute: 4.1 10*3/uL (ref 1.4–7.0)
Neutrophils: 60 %
Platelets: 231 10*3/uL (ref 150–450)
RBC: 5.12 x10E6/uL (ref 4.14–5.80)
RDW: 13 % (ref 11.6–15.4)
WBC: 6.8 10*3/uL (ref 3.4–10.8)

## 2022-09-13 LAB — BMP8+EGFR
BUN/Creatinine Ratio: 18 (ref 9–20)
BUN: 21 mg/dL (ref 6–24)
CO2: 23 mmol/L (ref 20–29)
Calcium: 9.8 mg/dL (ref 8.7–10.2)
Chloride: 103 mmol/L (ref 96–106)
Creatinine, Ser: 1.2 mg/dL (ref 0.76–1.27)
Glucose: 158 mg/dL — ABNORMAL HIGH (ref 70–99)
Potassium: 5.3 mmol/L — ABNORMAL HIGH (ref 3.5–5.2)
Sodium: 138 mmol/L (ref 134–144)
eGFR: 71 mL/min/{1.73_m2} (ref >59)

## 2022-09-13 LAB — BAYER DCA HB A1C WAIVED: HB A1C (BAYER DCA - WAIVED): 7.2 % — ABNORMAL HIGH (ref 4.8–5.6)

## 2022-09-13 MED ORDER — OXYCODONE HCL 10 MG PO TABS: 10.0000 mg | ORAL_TABLET | Freq: Four times a day (QID) | ORAL | 0 refills | Status: DC | PRN

## 2022-09-13 MED ORDER — OMEPRAZOLE 40 MG PO CPDR: 40.0000 mg | DELAYED_RELEASE_CAPSULE | Freq: Every day | ORAL | 3 refills | Status: DC

## 2022-09-13 MED ORDER — OXYCODONE HCL 10 MG PO TABS
10.0000 mg | ORAL_TABLET | Freq: Four times a day (QID) | ORAL | 0 refills | Status: DC | PRN
Start: 2022-09-30 — End: 2022-12-24
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
## 2022-09-21 ENCOUNTER — Other Ambulatory Visit: Payer: Self-pay

## 2022-09-21 DIAGNOSIS — E1169 Type 2 diabetes mellitus with other specified complication: Secondary | ICD-10-CM

## 2022-09-22 ENCOUNTER — Other Ambulatory Visit: Payer: Medicare HMO

## 2022-09-22 DIAGNOSIS — E1169 Type 2 diabetes mellitus with other specified complication: Secondary | ICD-10-CM | POA: Diagnosis not present

## 2022-09-23 LAB — MICROALBUMIN / CREATININE URINE RATIO
Creatinine, Urine: 58.5 mg/dL
Microalb/Creat Ratio: 13 mg/g creat (ref 0–29)
Microalbumin, Urine: 7.6 ug/mL

## 2022-09-26 ENCOUNTER — Other Ambulatory Visit: Payer: Self-pay

## 2022-09-26 ENCOUNTER — Emergency Department (HOSPITAL_COMMUNITY): Payer: Medicare HMO

## 2022-09-26 ENCOUNTER — Emergency Department (HOSPITAL_COMMUNITY)
Admission: EM | Admit: 2022-09-26 | Discharge: 2022-09-26 | Disposition: A | Discharge status: Home / Self Care | Payer: Medicare HMO | Attending: Emergency Medicine | Admitting: Emergency Medicine

## 2022-09-26 ENCOUNTER — Encounter (HOSPITAL_COMMUNITY): Payer: Self-pay

## 2022-09-26 DIAGNOSIS — M79642 Pain in left hand: Secondary | ICD-10-CM | POA: Insufficient documentation

## 2022-09-26 DIAGNOSIS — M25531 Pain in right wrist: Secondary | ICD-10-CM | POA: Insufficient documentation

## 2022-09-26 DIAGNOSIS — S6992XA Unspecified injury of left wrist, hand and finger(s), initial encounter: Secondary | ICD-10-CM | POA: Diagnosis not present

## 2022-09-26 DIAGNOSIS — M25532 Pain in left wrist: Secondary | ICD-10-CM | POA: Diagnosis not present

## 2022-09-26 DIAGNOSIS — M79641 Pain in right hand: Secondary | ICD-10-CM | POA: Diagnosis not present

## 2022-09-26 NOTE — ED Provider Triage Note (Signed)
Emergency Medicine Provider Triage Evaluation Note  Timothy Nelson , a 57 y.o. male  was evaluated in triage.  Pt complains of right wrist pain x 10 days and left hand pain x 1 day.  Patient has pain in left middle and ring MCP with palpable cords.  Patient states he has a history of Dupuytren's in this area.  Patient fell off a ladder and landed on his right wrist in a FOOSH manner.  Now has pain with radial deviation of the hand.  He is not on any blood thinners.  No pain radiation.   Review of Systems  Positive: Left hand and right wrist pain Negative: Obvious deformity or edema  Physical Exam  BP 131/80 (BP Location: Right Arm)   Pulse 83   Temp 98.4 F (36.9 C) (Oral)   Resp 18   Ht 6\' 1"  (1.854 m)   Wt 116.1 kg   SpO2 97%   BMI 33.78 kg/m  Gen:   Awake, no distress   Resp:  Normal effort  MSK:   Moves extremities without difficulty with exception of cannot fully extend middle and ring finger on left hand and pain with radial deviation of right hand Other:    Medical Decision Making  Medically screening exam initiated at 4:29 PM.  Appropriate orders placed.  Timothy Nelson was informed that the remainder of the evaluation will be completed by another provider, this initial triage assessment does not replace that evaluation, and the importance of remaining in the ED until their evaluation is complete.     Timothy Jenny, PA-C 09/26/22 [phone]
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
## 2022-09-26 NOTE — Discharge Instructions (Addendum)
Thank you for letting us treat you today. After reviewing your imaging, I feel you are safe to go home. Please follow up with your PCP in the next several days and provide them with your records from this visit. Return to the Emergency Room if pain becomes severe or symptoms worsen.  Please keep your thumb spica on as much as possible.  You have been referred to Ortho for follow-up care and for reimaging in approximately 10 days to evaluate for scaphoid fracture.
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
## 2022-09-27 ENCOUNTER — Encounter: Payer: Self-pay | Admitting: Family Medicine

## 2022-09-28 ENCOUNTER — Ambulatory Visit: Payer: Medicare HMO | Admitting: Orthopaedic Surgery

## 2022-09-28 ENCOUNTER — Encounter: Payer: Self-pay | Admitting: Orthopaedic Surgery

## 2022-09-28 VITALS — BP 121/79 | HR 66 | Ht 73.0 in | Wt 260.0 lb

## 2022-09-28 DIAGNOSIS — M654 Radial styloid tenosynovitis [de Quervain]: Secondary | ICD-10-CM | POA: Diagnosis not present

## 2022-09-28 DIAGNOSIS — M720 Palmar fascial fibromatosis [Dupuytren]: Secondary | ICD-10-CM

## 2022-09-28 MED ORDER — METHYLPREDNISOLONE ACETATE 40 MG/ML IJ SUSP
40.0000 mg | Freq: Once | INTRAMUSCULAR | Status: AC
Start: 2022-09-28 — End: 2022-09-28
  Administered 2022-09-28: 40 mg via INTRA_ARTICULAR

## 2022-09-28 NOTE — Patient Instructions (Signed)
[phone] will call to schedule appointment

## 2022-09-28 NOTE — Progress Notes (Signed)
Subjective:    Patient ID: Timothy Nelson, male    DOB: May 20, 1965, 57 y.o.   MRN: [id number]  HPI He hurt his left hand with a hyperextension injury when he was in his pool about two weeks ago.  He has a Dupuytren contracture on the left.  I will have him see the hand surgeon for this for possible injections.  He is agreeable to this.  His left hand is much improved.  He has pain in the right wrist and thumb that has gradually gotten worse over the last month.  He has pain using the thumb and trying to use hammer or screwdriver.  He was seen in the ER on 09-26-22 for both hands. I have reviewed the ER notes and X-rays.  I have independently reviewed and interpreted x-rays of this patient done at another site by another physician or qualified health professional.  He cannot take NSAIDs but he still uses ibuprofen.   Review of Systems  Constitutional:  Positive for activity change.  Musculoskeletal:  Positive for arthralgias and myalgias.  All other systems reviewed and are negative. For Review of Systems, all other systems reviewed and are negative.  The following is a summary of the past history medically, past history surgically, known current medicines, social history and family history.  This information is gathered electronically by the computer from prior information and documentation.  I review this each visit and have found including this information at this point in the chart is beneficial and informative.   Past Medical History:  Diagnosis Date   Arthritis    Barrett's esophagus    Chronic pain    Diabetes (HCC)    Essential hypertension    Gastroparesis    GERD (gastroesophageal reflux disease)    Hyperlipidemia    Obesity    Type 2 diabetes mellitus (HCC)     Past Surgical History:  Procedure Laterality Date   ESOPHAGOGASTRODUODENOSCOPY (EGD) WITH PROPOFOL N/A 04/04/2015   Procedure: ESOPHAGOGASTRODUODENOSCOPY (EGD) WITH PROPOFOL;  Surgeon: Malissa Hippo, MD;   Location: [facility];  Service: Endoscopy;  Laterality: N/A;  8:30   FRACTURE SURGERY  03/2005   Left arm, left thumb, bilateral legs   HIP SURGERY Right    WRIST FRACTURE SURGERY Left     Current Outpatient Medications on File Prior to Visit  Medication Sig Dispense Refill   Accu-Chek Softclix Lancets lancets Test BS TID Dx E11.9 300 each 3   Blood Glucose Monitoring Suppl (ACCU-CHEK GUIDE) w/Device KIT Test BS TID Dx E11.9 1 kit 0   Dulaglutide (TRULICITY) 4.5 MG/0.5ML SOPN Inject 4.5 mg as directed once a week. 6 mL 1   empagliflozin (JARDIANCE) 25 MG TABS tablet Take 1 tablet (25 mg total) by mouth daily before breakfast. 90 tablet 1   fluticasone (FLONASE) 50 MCG/ACT nasal spray USE 1 SPRAY IN EACH NOSTRIL TWICE DAILY AS NEEDED FOR ALLERGIES OR RHINITIS (SUBSTITUTED FOR FLONASE) 48 g 3   glipiZIDE (GLUCOTROL XL) 5 MG 24 hr tablet Take 1 tablet (5 mg total) by mouth daily with breakfast. 90 tablet 3   glucose blood test strip Test BS TID Dx E11.9 300 each 3   ibuprofen (ADVIL) 600 MG tablet Take 1 tablet (600 mg total) by mouth every 8 (eight) hours as needed for mild pain or moderate pain. 270 tablet 0   ketoconazole (NIZORAL) 2 % cream Apply 1 application topically daily. 60 g 2   lisinopril (ZESTRIL) 20 MG tablet Take 1 tablet (  20 mg total) by mouth daily. 90 tablet 3   metoCLOPramide (REGLAN) 5 MG tablet Take 5 mg by mouth 3 (three) times daily.     Omega-3 Fatty Acids (FISH OIL) 1200 MG CPDR Take 1,200 mg by mouth daily. 90 capsule 3   omeprazole (PRILOSEC) 40 MG capsule Take 1 capsule (40 mg total) by mouth daily. 90 capsule 3   [START ON 11/29/2022] Oxycodone HCl 10 MG TABS Take 1 tablet (10 mg total) by mouth 4 (four) times daily as needed. 120 tablet 0   [START ON 10/29/2022] Oxycodone HCl 10 MG TABS Take 1 tablet (10 mg total) by mouth 4 (four) times daily as needed. 120 tablet 0   [START ON 09/30/2022] Oxycodone HCl 10 MG TABS Take 1 tablet (10 mg total) by mouth 4 (four)  times daily as needed. 120 tablet 0   simvastatin (ZOCOR) 40 MG tablet Take 1 tablet (40 mg total) by mouth daily at 6 PM. 90 tablet 3   triamcinolone cream (KENALOG) 0.1 % Apply 1 application topically 2 (two) times daily. 454 g 1   No current facility-administered medications on file prior to visit.    Social History   Socioeconomic History   Marital status: Married    Spouse name: Not on file   Number of children: 1   Years of education: Not on file   Highest education level: Associate degree: occupational, Scientist, product/process development, or vocational program  Occupational History   Occupation: Disability  Tobacco Use   Smoking status: Former    Current packs/day: 0.00    Average packs/day: 1 pack/day for 15.0 years (15.0 ttl pk-yrs)    Types: Cigarettes, Cigars    Start date: 01/04/2003    Quit date: 01/03/2018    Years since quitting: 4.7   Smokeless tobacco: Never   Tobacco comments:    Smoked intermittently for 20 years; 1-2 cigars weekly as of 02/07/2018  Vaping Use   Vaping status: Never Used  Substance and Sexual Activity   Alcohol use: Yes    Alcohol/week: 3.0 standard drinks of alcohol    Types: 3 Cans of beer per week    Comment: glass wine a week.    Drug use: No   Sexual activity: Yes    Birth control/protection: None  Other Topics Concern   Not on file  Social History Narrative   Restaurant manager, fast food.     Social Determinants of Health   Financial Resource Strain: Low Risk  (05/31/2022)   Overall Financial Resource Strain (CARDIA)    Difficulty of Paying Living Expenses: Not hard at all  Food Insecurity: No Food Insecurity (05/31/2022)   Hunger Vital Sign    Worried About Running Out of Food in the Last Year: Never true    Ran Out of Food in the Last Year: Never true  Transportation Needs: No Transportation Needs (05/31/2022)   PRAPARE - Administrator, Civil Service (Medical): No    Lack of Transportation (Non-Medical): No  Physical Activity: Insufficiently  Active (05/31/2022)   Exercise Vital Sign    Days of Exercise per Week: 2 days    Minutes of Exercise per Session: 20 min  Stress: No Stress Concern Present (05/31/2022)   [employer] of Occupational Health - Occupational Stress Questionnaire    Feeling of Stress : Not at all  Social Connections: Moderately Integrated (05/31/2022)   Social Connection and Isolation Panel [NHANES]    Frequency of Communication with Friends and Family: Once a week  Frequency of Social Gatherings with Friends and Family: Once a week    Attends Religious Services: 1 to 4 times per year    Active Member of [employer] or Organizations: Yes    Attends Engineer, structural: More than 4 times per year    Marital Status: Married  Catering manager Violence: Not At Risk (04/14/2022)   Humiliation, Afraid, Rape, and Kick questionnaire    Fear of Current or Ex-Partner: No    Emotionally Abused: No    Physically Abused: No    Sexually Abused: No    Family History  Problem Relation Age of Onset   Diabetes Mother    Hypertension Mother    Hyperlipidemia Mother    Arthritis Mother    Esophageal cancer Mother        patient unsure   Diabetes Father    Heart disease Father        Age 50   Kidney disease Father    Heart attack Father        Quadruple bypass at age 17   Hypertension Sister    Diabetes Daughter    Colon cancer Neg Hx    Stomach cancer Neg Hx    Pancreatic cancer Neg Hx    Liver disease Neg Hx     BP 121/79   Pulse 66   Ht 6\' 1"  (1.854 m)   Wt 260 lb (117.9 kg)   BMI 34.30 kg/m   Body mass index is 34.3 kg/m.      Objective:   Physical Exam Vitals and nursing note reviewed. Exam conducted with a chaperone present.  Constitutional:      Appearance: He is well-developed.  HENT:     Head: Normocephalic and atraumatic.  Eyes:     Conjunctiva/sclera: Conjunctivae normal.     Pupils: Pupils are equal, round, and reactive to light.  Cardiovascular:     Rate and Rhythm:  Normal rate and regular rhythm.  Pulmonary:     Effort: Pulmonary effort is normal.  Abdominal:     Palpations: Abdomen is soft.  Musculoskeletal:       Hands:     Cervical back: Normal range of motion and neck supple.  Skin:    General: Skin is warm and dry.  Neurological:     Mental Status: He is alert and oriented to person, place, and time.     Cranial Nerves: No cranial nerve deficit.     Motor: No abnormal muscle tone.     Coordination: Coordination normal.     Deep Tendon Reflexes: Reflexes are normal and symmetric. Reflexes normal.  Psychiatric:        Behavior: Behavior normal.        Thought Content: Thought content normal.        Judgment: Judgment normal.           Assessment & Plan:   Encounter Diagnoses  Name Primary?   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  I instilled 1% Xylocaine and 1 cc DepoMedrol 40 into the tendon sheath of the first extensor compartment by sterile technique tolerated well.  He has a thumb splint and wrist splint and he is to continue this.  I have made appointment for him to see hand surgeon.  Return here in two weeks.  Call if any problem.  Precautions discussed.  Electronically Signed Darreld Mclean, MD 9/24/202410:31 AM

## 2022-10-04 DIAGNOSIS — Q141 Congenital malformation of retina: Secondary | ICD-10-CM | POA: Diagnosis not present

## 2022-10-04 DIAGNOSIS — H35462 Secondary vitreoretinal degeneration, left eye: Secondary | ICD-10-CM | POA: Diagnosis not present

## 2022-10-04 DIAGNOSIS — H2513 Age-related nuclear cataract, bilateral: Secondary | ICD-10-CM | POA: Diagnosis not present

## 2022-10-04 DIAGNOSIS — H5213 Myopia, bilateral: Secondary | ICD-10-CM | POA: Diagnosis not present

## 2022-10-04 DIAGNOSIS — D3131 Benign neoplasm of right choroid: Secondary | ICD-10-CM | POA: Diagnosis not present

## 2022-10-04 DIAGNOSIS — H524 Presbyopia: Secondary | ICD-10-CM | POA: Diagnosis not present

## 2022-10-04 DIAGNOSIS — E119 Type 2 diabetes mellitus without complications: Secondary | ICD-10-CM | POA: Diagnosis not present

## 2022-10-04 LAB — HM DIABETES EYE EXAM

## 2022-10-06 ENCOUNTER — Ambulatory Visit: Payer: Medicare HMO | Admitting: Orthopedic Surgery

## 2022-10-06 DIAGNOSIS — M720 Palmar fascial fibromatosis [Dupuytren]: Secondary | ICD-10-CM | POA: Diagnosis not present

## 2022-10-06 NOTE — Progress Notes (Unsigned)
Timothy Nelson - 57 y.o. male MRN [id number]  Date of birth: 1965-04-16  Office Visit Note: Visit Date: 10/06/2022 PCP: Dettinger, Elige Radon, MD Referred by: Darreld Mclean, MD  Subjective: No chief complaint on file.  HPI: Timothy Nelson is a pleasant 57 y.o. male who presents today for evaluation of bilateral hand contractures consistent with Dupuytren's disease.  He does not have a known family history of Dupuytren's.  States that the contractures have been progressive over the past multiple years, worsening in nature.  Pertinent ROS were reviewed with the patient and found to be negative unless otherwise specified above in HPI.   Visit Reason: bilateral hand Dupuytren's Duration of symptoms: years Hand dominance: right Occupation: unemployed Diabetic: Yes/ 7.2 Smoking: No Heart/Lung History: none Blood Thinners:  none   Assessment & Plan: Visit Diagnoses: No diagnosis found.  Plan: Extensive discussion was had with the patient today regarding his bilateral hand Dupuytren's disease.  I reviewed the etiology and pathophysiology of Dupuytren's disease.  We discussed a variety of treatment options ranging from conservative to surgical.  From a conservative standpoint, we discussed splinting and stretching with activity modification, we also discussed potential Xiaflex injections in order to help correct contracture.  We also discussed the possibility for surgical intervention in the future should his symptoms and contracture remain refractory to the conservative options.  We discussed risk and benefits of all the above treatment options.  We also discussed at length the recurrence of Dupuytren's disease despite manipulation, he expressed full understanding.  Understanding his options, he would like to move forward with Xiaflex injection for the right hand.  His cord formation and contracture is most notable at the right ring finger MCP, approximately 45 degrees.  He is an appropriate  candidate for Xiaflex injection of the right hand.  He will return when the injection is ready for both injection and subsequent manipulation which was explained in detail today.  Greater than 45 minutes was spent reviewing prior notes, imaging and in discussion/evaluation of patient.  Follow-up: No follow-ups on file.   Meds & Orders: No orders of the defined types were placed in this encounter.  No orders of the defined types were placed in this encounter.    Procedures: No procedures performed      Clinical History: No specialty comments available.  He reports that he quit smoking about 4 years ago. His smoking use included cigarettes and cigars. He started smoking about 19 years ago. He has a 15 pack-year smoking history. He has never used smokeless tobacco.  Recent Labs    03/03/22 0952 06/02/22 1446 09/13/22 0800  HGBA1C 8.5* 6.4* 7.2*    Objective:   Vital Signs: There were no vitals taken for this visit.  Physical Exam  Gen: Well-appearing, in no acute distress; non-toxic CV: Regular Rate. Well-perfused. Warm.  Resp: Breathing unlabored on room air; no wheezing. Psych: Fluid speech in conversation; appropriate affect; normal thought process  Ortho Exam Right hand: - Palpable cords in line with the ring and small finger - Contracture at the right ring MCP 45 degrees with positive tabletop test  Left hand: - Palpable cords in line with the ring and small finger - Contracture at left ring MCP 45 degrees with positive tabletop test  Imaging: No results found.  Past Medical/Family/Surgical/Social History: Medications & Allergies reviewed per EMR, new medications updated. Patient Active Problem List   Diagnosis Date Noted   SOB (shortness of breath) 08/18/2022   Coronary artery disease  involving native coronary artery of native heart without angina pectoris 06/22/2022   Dyslipidemia 06/22/2022   Dupuytren's disease of palm with nodules without contracture  07/14/2021   Mixed hyperlipidemia 10/03/2018   Personal history of diabetic foot ulcer 09/30/2017   Neuropathy 08/05/2017   Diabetes mellitus, type 2 (HCC) 08/05/2017   Class 2 drug-induced obesity with serious comorbidity and body mass index (BMI) of 35.0 to 35.9 in adult 01/19/2017   Snoring 01/19/2017   Excessive daytime sleepiness 01/19/2017   GERD (gastroesophageal reflux disease) 01/09/2015   Hypertension associated with diabetes (HCC) 09/11/2014   Chronic foot pain 09/11/2014   Past Medical History:  Diagnosis Date   Arthritis    Barrett's esophagus    Chronic pain    Diabetes (HCC)    Essential hypertension    Gastroparesis    GERD (gastroesophageal reflux disease)    Hyperlipidemia    Obesity    Type 2 diabetes mellitus (HCC)    Family History  Problem Relation Age of Onset   Diabetes Mother    Hypertension Mother    Hyperlipidemia Mother    Arthritis Mother    Esophageal cancer Mother        patient unsure   Diabetes Father    Heart disease Father        Age 74   Kidney disease Father    Heart attack Father        Quadruple bypass at age 68   Hypertension Sister    Diabetes Daughter    Colon cancer Neg Hx    Stomach cancer Neg Hx    Pancreatic cancer Neg Hx    Liver disease Neg Hx    Past Surgical History:  Procedure Laterality Date   ESOPHAGOGASTRODUODENOSCOPY (EGD) WITH PROPOFOL N/A 04/04/2015   Procedure: ESOPHAGOGASTRODUODENOSCOPY (EGD) WITH PROPOFOL;  Surgeon: Malissa Hippo, MD;  Location: [facility];  Service: Endoscopy;  Laterality: N/A;  8:30   FRACTURE SURGERY  03/2005   Left arm, left thumb, bilateral legs   HIP SURGERY Right    WRIST FRACTURE SURGERY Left    Social History   Occupational History   Occupation: Disability  Tobacco Use   Smoking status: Former    Current packs/day: 0.00    Average packs/day: 1 pack/day for 15.0 years (15.0 ttl pk-yrs)    Types: Cigarettes, Cigars    Start date: 01/04/2003    Quit date: 01/03/2018     Years since quitting: 4.7   Smokeless tobacco: Never   Tobacco comments:    Smoked intermittently for 20 years; 1-2 cigars weekly as of 02/07/2018  Vaping Use   Vaping status: Never Used  Substance and Sexual Activity   Alcohol use: Yes    Alcohol/week: 3.0 standard drinks of alcohol    Types: 3 Cans of beer per week    Comment: glass wine a week.    Drug use: No   Sexual activity: Yes    Birth control/protection: None    Timothy Nelson) Timothy Nelson, M.D. [facility] 10:07 AM

## 2022-10-07 DIAGNOSIS — B351 Tinea unguium: Secondary | ICD-10-CM | POA: Diagnosis not present

## 2022-10-07 DIAGNOSIS — L859 Epidermal thickening, unspecified: Secondary | ICD-10-CM | POA: Diagnosis not present

## 2022-10-07 DIAGNOSIS — E1142 Type 2 diabetes mellitus with diabetic polyneuropathy: Secondary | ICD-10-CM | POA: Diagnosis not present

## 2022-10-07 DIAGNOSIS — R234 Changes in skin texture: Secondary | ICD-10-CM | POA: Diagnosis not present

## 2022-10-08 ENCOUNTER — Telehealth: Payer: Self-pay

## 2022-10-08 NOTE — Telephone Encounter (Signed)
-----   Message from Ellis Savage sent at 10/06/2022 10:47 AM EDT ----- Regarding: Xiaflex Right ring MCP

## 2022-10-08 NOTE — Telephone Encounter (Signed)
Xiaflex form faxed to [facility] at [phone] for right ring finger

## 2022-10-12 ENCOUNTER — Ambulatory Visit: Payer: Medicare HMO | Admitting: Orthopaedic Surgery

## 2022-10-12 ENCOUNTER — Encounter: Payer: Self-pay | Admitting: Orthopaedic Surgery

## 2022-10-12 VITALS — BP 119/66 | HR 71 | Ht 73.0 in | Wt 259.0 lb

## 2022-10-12 DIAGNOSIS — M654 Radial styloid tenosynovitis [de Quervain]: Secondary | ICD-10-CM | POA: Diagnosis not present

## 2022-10-12 NOTE — Progress Notes (Signed)
I am much better.  He has little pain now over the first compartment on the right wrist and thumb.  Motion is full.  NV intact.  Encounter Diagnosis  Name Primary?   De Quervain's tenosynovitis, right Yes   I will see prn.  Call if any problem.  Precautions discussed.  Electronically Signed Darreld Mclean, MD 10/8/20248:07 AM

## 2022-10-14 ENCOUNTER — Telehealth: Payer: Self-pay

## 2022-10-14 NOTE — Telephone Encounter (Signed)
Talked with Alvino Chapel with [employer] and advised her that we are in network with [facility] and per Alvino Chapel, benefits will be reran.

## 2022-10-19 ENCOUNTER — Ambulatory Visit: Payer: Medicare HMO | Admitting: Orthopedic Surgery

## 2022-10-28 ENCOUNTER — Encounter: Payer: Self-pay | Admitting: Nurse Practitioner

## 2022-10-28 ENCOUNTER — Ambulatory Visit: Payer: Medicare HMO | Admitting: Nurse Practitioner

## 2022-10-28 VITALS — BP 128/74 | HR 62 | Ht 73.0 in | Wt 259.2 lb

## 2022-10-28 DIAGNOSIS — I10 Essential (primary) hypertension: Secondary | ICD-10-CM | POA: Diagnosis not present

## 2022-10-28 DIAGNOSIS — Z7985 Long-term (current) use of injectable non-insulin antidiabetic drugs: Secondary | ICD-10-CM | POA: Diagnosis not present

## 2022-10-28 DIAGNOSIS — E1169 Type 2 diabetes mellitus with other specified complication: Secondary | ICD-10-CM

## 2022-10-28 DIAGNOSIS — Z7984 Long term (current) use of oral hypoglycemic drugs: Secondary | ICD-10-CM | POA: Diagnosis not present

## 2022-10-28 DIAGNOSIS — E782 Mixed hyperlipidemia: Secondary | ICD-10-CM | POA: Diagnosis not present

## 2022-10-28 NOTE — Progress Notes (Signed)
10/28/2022, 8:21 AM   Endocrinology follow-up note   Subjective:    Patient ID: Timothy Nelson, male    DOB: 1965/04/26.  Ceon Grishaber is being seen in follow-up after he was seen in consultation for management of currently uncontrolled symptomatic diabetes requested by  Dettinger, Elige Radon, MD.   Past Medical History:  Diagnosis Date   Arthritis    Barrett's esophagus    Chronic pain    Diabetes (HCC)    Essential hypertension    Gastroparesis    GERD (gastroesophageal reflux disease)    Hyperlipidemia    Obesity    Type 2 diabetes mellitus (HCC)     Past Surgical History:  Procedure Laterality Date   ESOPHAGOGASTRODUODENOSCOPY (EGD) WITH PROPOFOL N/A 04/04/2015   Procedure: ESOPHAGOGASTRODUODENOSCOPY (EGD) WITH PROPOFOL;  Surgeon: Malissa Hippo, MD;  Location: [facility];  Service: Endoscopy;  Laterality: N/A;  8:30   FRACTURE SURGERY  03/2005   Left arm, left thumb, bilateral legs   HIP SURGERY Right    WRIST FRACTURE SURGERY Left     Social History   Socioeconomic History   Marital status: Married    Spouse name: Not on file   Number of children: 1   Years of education: Not on file   Highest education level: Associate degree: occupational, Scientist, product/process development, or vocational program  Occupational History   Occupation: Disability  Tobacco Use   Smoking status: Former    Current packs/day: 0.00    Average packs/day: 1 pack/day for 15.0 years (15.0 ttl pk-yrs)    Types: Cigarettes, Cigars    Start date: 01/04/2003    Quit date: 01/03/2018    Years since quitting: 4.8   Smokeless tobacco: Never   Tobacco comments:    Smoked intermittently for 20 years; 1-2 cigars weekly as of 02/07/2018  Vaping Use   Vaping status: Never Used  Substance and Sexual Activity   Alcohol use: Yes    Alcohol/week: 3.0 standard drinks of alcohol    Types: 3 Cans of beer per week    Comment: glass wine a week.    Drug  use: No   Sexual activity: Yes    Birth control/protection: None  Other Topics Concern   Not on file  Social History Narrative   Restaurant manager, fast food.     Social Determinants of Health   Financial Resource Strain: Low Risk  (05/31/2022)   Overall Financial Resource Strain (CARDIA)    Difficulty of Paying Living Expenses: Not hard at all  Food Insecurity: No Food Insecurity (05/31/2022)   Hunger Vital Sign    Worried About Running Out of Food in the Last Year: Never true    Ran Out of Food in the Last Year: Never true  Transportation Needs: No Transportation Needs (05/31/2022)   PRAPARE - Administrator, Civil Service (Medical): No    Lack of Transportation (Non-Medical): No  Physical Activity: Insufficiently Active (05/31/2022)   Exercise Vital Sign    Days of Exercise per Week: 2 days    Minutes of Exercise per Session: 20 min  Stress: No Stress Concern Present (05/31/2022)   [employer] of Occupational Health - Occupational Stress Questionnaire  Feeling of Stress : Not at all  Social Connections: Moderately Integrated (05/31/2022)   Social Connection and Isolation Panel [NHANES]    Frequency of Communication with Friends and Family: Once a week    Frequency of Social Gatherings with Friends and Family: Once a week    Attends Religious Services: 1 to 4 times per year    Active Member of [employer] or Organizations: Yes    Attends Engineer, structural: More than 4 times per year    Marital Status: Married    Family History  Problem Relation Age of Onset   Diabetes Mother    Hypertension Mother    Hyperlipidemia Mother    Arthritis Mother    Esophageal cancer Mother        patient unsure   Diabetes Father    Heart disease Father        Age 65   Kidney disease Father    Heart attack Father        Quadruple bypass at age 4   Hypertension Sister    Diabetes Daughter    Colon cancer Neg Hx    Stomach cancer Neg Hx    Pancreatic cancer Neg Hx     Liver disease Neg Hx     Outpatient Encounter Medications as of 10/28/2022  Medication Sig   Accu-Chek Softclix Lancets lancets Test BS TID Dx E11.9   Blood Glucose Monitoring Suppl (ACCU-CHEK GUIDE) w/Device KIT Test BS TID Dx E11.9   Dulaglutide (TRULICITY) 4.5 MG/0.5ML SOPN Inject 4.5 mg as directed once a week.   empagliflozin (JARDIANCE) 25 MG TABS tablet Take 1 tablet (25 mg total) by mouth daily before breakfast.   fluticasone (FLONASE) 50 MCG/ACT nasal spray USE 1 SPRAY IN EACH NOSTRIL TWICE DAILY AS NEEDED FOR ALLERGIES OR RHINITIS (SUBSTITUTED FOR FLONASE)   glipiZIDE (GLUCOTROL XL) 5 MG 24 hr tablet Take 1 tablet (5 mg total) by mouth daily with breakfast.   glucose blood test strip Test BS TID Dx E11.9   ibuprofen (ADVIL) 600 MG tablet Take 1 tablet (600 mg total) by mouth every 8 (eight) hours as needed for mild pain or moderate pain.   ketoconazole (NIZORAL) 2 % cream Apply 1 application topically daily.   lisinopril (ZESTRIL) 20 MG tablet Take 1 tablet (20 mg total) by mouth daily.   metoCLOPramide (REGLAN) 5 MG tablet Take 5 mg by mouth 3 (three) times daily.   Omega-3 Fatty Acids (FISH OIL) 1200 MG CPDR Take 1,200 mg by mouth daily.   omeprazole (PRILOSEC) 40 MG capsule Take 1 capsule (40 mg total) by mouth daily.   [START ON 11/29/2022] Oxycodone HCl 10 MG TABS Take 1 tablet (10 mg total) by mouth 4 (four) times daily as needed.   [START ON 10/29/2022] Oxycodone HCl 10 MG TABS Take 1 tablet (10 mg total) by mouth 4 (four) times daily as needed.   Oxycodone HCl 10 MG TABS Take 1 tablet (10 mg total) by mouth 4 (four) times daily as needed.   simvastatin (ZOCOR) 40 MG tablet Take 1 tablet (40 mg total) by mouth daily at 6 PM.   triamcinolone cream (KENALOG) 0.1 % Apply 1 application topically 2 (two) times daily.   No facility-administered encounter medications on file as of 10/28/2022.    ALLERGIES: Allergies  Allergen Reactions   Fenofibrate Rash    Permament rash.      VACCINATION STATUS: Immunization History  Administered Date(s) Administered   Influenza,inj,Quad PF,6+ Mos 11/15/2018  Influenza-Unspecified 09/04/2016   Moderna Covid-19 Vaccine Bivalent Booster 57yrs & up 02/14/2021   Moderna Sars-Covid-2 Vaccination 03/13/2019, 04/10/2019, 11/06/2019, 06/19/2020   Pneumococcal Polysaccharide-23 09/16/2016   Tdap 04/05/2011, 12/04/2021   Zoster Recombinant(Shingrix) 09/03/2020, 03/04/2021    Diabetes He presents for his follow-up diabetic visit. He has type 2 diabetes mellitus. Onset time: He was diagnosed at approximate age of 40 years. His disease course has been stable. There are no hypoglycemic associated symptoms. Pertinent negatives for hypoglycemia include no confusion, headaches, pallor or seizures. Pertinent negatives for diabetes include no chest pain, no fatigue, no polydipsia, no polyphagia, no polyuria, no weakness and no weight loss. There are no hypoglycemic complications. Symptoms are stable. Diabetic complications include nephropathy (mild). Risk factors for coronary artery disease include diabetes mellitus, dyslipidemia, family history, obesity, male sex, hypertension, sedentary lifestyle and tobacco exposure. Current diabetic treatment includes oral agent (dual therapy) (and Trulicity). He is compliant with treatment most of the time. His weight is fluctuating minimally. He is following a generally unhealthy diet. When asked about meal planning, he reported none. He has not had a previous visit with a dietitian. He rarely participates in exercise. (He presents today with no logs or meter to review.  His most recent A1c, checked by PCP on 9/9 was 7.2%, increasing from last visit of 6.4%.  He denies any hypoglycemia.  He notes he did slack up with his diet some since last visit.) An ACE inhibitor/angiotensin II receptor blocker is being taken. He does not see a podiatrist.Eye exam is current.  Hyperlipidemia This is a chronic problem. The  current episode started more than 1 year ago. The problem is controlled. Recent lipid tests were reviewed and are normal. Exacerbating diseases include chronic renal disease, diabetes and obesity. Factors aggravating his hyperlipidemia include fatty foods. Pertinent negatives include no chest pain, myalgias or shortness of breath. Current antihyperlipidemic treatment includes statins. The current treatment provides moderate improvement of lipids. Compliance problems include adherence to diet and adherence to exercise.  Risk factors for coronary artery disease include dyslipidemia, diabetes mellitus, family history, hypertension, male sex, obesity and a sedentary lifestyle.  Hypertension This is a chronic problem. The current episode started more than 1 year ago. The problem has been resolved since onset. The problem is controlled. Pertinent negatives include no chest pain, headaches, neck pain, palpitations or shortness of breath. There are no associated agents to hypertension. Risk factors for coronary artery disease include diabetes mellitus, dyslipidemia, male gender, obesity, sedentary lifestyle, family history and smoking/tobacco exposure. Past treatments include ACE inhibitors. The current treatment provides moderate improvement. There are no compliance problems.  Hypertensive end-organ damage includes kidney disease. Identifiable causes of hypertension include chronic renal disease.    Review of systems  Constitutional: + Minimally fluctuating body weight,  current Body mass index is 34.2 kg/m. , no fatigue, no subjective hyperthermia, no subjective hypothermia Eyes: no blurry vision, no xerophthalmia ENT: no sore throat, no nodules palpated in throat, no dysphagia/odynophagia, no hoarseness Cardiovascular: no chest pain, no shortness of breath, no palpitations, no leg swelling Respiratory: no cough, no shortness of breath Gastrointestinal: no nausea/vomiting/diarrhea Musculoskeletal: no  muscle/joint aches Skin: no rashes, no hyperemia Neurological: no tremors, no numbness, no tingling, no dizziness Psychiatric: no depression, no anxiety   Objective:    BP 128/74 (BP Location: Left Arm, Patient Position: Sitting, Cuff Size: Large)   Pulse 62   Ht 6\' 1"  (1.854 m)   Wt 259 lb 3.2 oz (117.6 kg)   BMI  34.20 kg/m   Wt Readings from Last 3 Encounters:  10/28/22 259 lb 3.2 oz (117.6 kg)  10/12/22 259 lb (117.5 kg)  09/28/22 260 lb (117.9 kg)    BP Readings from Last 3 Encounters:  10/28/22 128/74  10/12/22 119/66  09/28/22 121/79     Physical Exam- Limited  Constitutional:  Body mass index is 34.2 kg/m. , not in acute distress, normal state of mind Eyes:  EOMI, no exophthalmos Musculoskeletal: no gross deformities, strength intact in all four extremities, no gross restriction of joint movements Skin:  no rashes, no hyperemia Neurological: no tremor with outstretched hands   Diabetic Foot Exam - Simple   No data filed     CMP ( most recent) CMP     Component Value Date/Time   NA 138 09/13/2022 0814   K 5.3 (H) 09/13/2022 0814   CL 103 09/13/2022 0814   CO2 23 09/13/2022 0814   GLUCOSE 158 (H) 09/13/2022 0814   GLUCOSE 208 (H) 04/03/2015 1500   BUN 21 09/13/2022 0814   CREATININE 1.20 09/13/2022 0814   CALCIUM 9.8 09/13/2022 0814   PROT 7.0 06/02/2022 1448   ALBUMIN 4.5 06/02/2022 1448   AST 22 06/02/2022 1448   ALT 28 06/02/2022 1448   ALKPHOS 122 (H) 06/02/2022 1448   BILITOT 0.3 06/02/2022 1448   GFRNONAA 66 01/18/2020 0842   GFRAA 76 01/18/2020 0842   Diabetic Labs (most recent): Lab Results  Component Value Date   HGBA1C 7.2 (H) 09/13/2022   HGBA1C 6.4 (H) 06/02/2022   HGBA1C 8.5 (H) 03/03/2022   MICROALBUR 30 mg/L 06/25/2022   MICROALBUR 80 03/23/2021   MICROALBUR 11.7 05/08/2019     Lipid Panel ( most recent) Lipid Panel     Component Value Date/Time   CHOL 137 06/02/2022 1448   TRIG 341 (H) 06/02/2022 1448   HDL 34 (L)  06/02/2022 1448   CHOLHDL 4.0 06/02/2022 1448   LDLCALC 51 06/02/2022 1448   LABVLDL 52 (H) 06/02/2022 1448     Lab Results  Component Value Date   TSH 4.170 09/03/2020   TSH 2.010 05/08/2019   TSH 2.13 02/07/2018   TSH 3.510 09/16/2016   TSH 1.270 09/11/2014   FREET4 1.38 09/03/2020   FREET4 1.48 05/08/2019     Assessment & Plan:   1) Controlled type 2 diabetes mellitus without complication  - Shoua Brintnall has currently uncontrolled symptomatic type 2 DM since 57 years of age.  He presents today with no logs or meter to review.  His most recent A1c, checked by PCP on 9/9 was 7.2%, increasing from last visit of 6.4%.  He denies any hypoglycemia.  He notes he did slack up with his diet some since last visit.  -Recent labs reviewed.    - I had a long discussion with him about the progressive nature of diabetes and the pathology behind its complications. -his diabetes is complicated by obesity/sedentary life, history of smoking and he remains at a high risk for more acute and chronic complications which include CAD, CVA, CKD, retinopathy, and neuropathy. These are all discussed in detail with him.  - Nutritional counseling repeated at each appointment due to patients tendency to fall back in to old habits.  - The patient admits there is a room for improvement in their diet and drink choices. -  Suggestion is made for the patient to avoid simple carbohydrates from their diet including Cakes, Sweet Desserts / Pastries, Ice Cream, Soda (diet and regular), Sweet  Tea, Candies, Chips, Cookies, Sweet Pastries, Store Bought Juices, Alcohol in Excess of 1-2 drinks a day, Artificial Sweeteners, Coffee Creamer, and "Sugar-free" Products. This will help patient to have stable blood glucose profile and potentially avoid unintended weight gain.   - I encouraged the patient to switch to unprocessed or minimally processed complex starch and increased protein intake (animal or plant source), fruits,  and vegetables.   - Patient is advised to stick to a routine mealtimes to eat 3 meals a day and avoid unnecessary snacks (to snack only to correct hypoglycemia).  - he has seen Norm Salt, RDN, CDE for diabetes education.  - I have approached him with the following individualized plan to manage his diabetes and patient agrees:   -He is advised to continue his current medications including Trulicity 4.5 mg SQ weekly, Glipizide 5 mg XL daily with breakfast, and Jardiance 25 mg po daily for now.    He is encouraged to continue monitoring blood glucose at least once daily (3 times per week), before breakfast and notify the clinic if glucose levels are less than 70 or greater than 200 for 3 tests in a row.  - Specific targets for  A1c;  LDL, HDL, Triglycerides, were discussed with the patient.  2) Blood Pressure /Hypertension:  His blood pressure is controlled to target.  He is advised to continue Lisinopril 20 mg po daily.    3) Lipids/Hyperlipidemia:  His recent lipid panel on 06/02/22 shows controlled LDL at 61 and elevated triglycerides of 341.  He is advised to continue Simvastatin 40 mg po daily at bedtime and Fish Oil 1200 mg po daily. Side effects and precautions discussed with him. He is advised to avoid fried foods and butter.    4)  Weight/Diet: His Body mass index is 34.2 kg/m.--  clearly complicating his diabetes care.  He is a candidate for modest weight loss.  I discussed with him the fact that loss of 5 - 10% of his  current body weight will have the most impact on his diabetes management.  Exercise, and detailed carbohydrates information provided  -  detailed on discharge instructions.  5) Chronic Care/Health Maintenance: -he is on ACEI/ARB and Statin medications and is encouraged to initiate and continue to follow up with Ophthalmology, Dentist,  Podiatrist at least yearly or according to recommendations, and advised to stay away from smoking.  He is safe he does not believe  in flu shots,  I have recommended yearly flu vaccine and pneumonia vaccine at least every 5 years; moderate intensity exercise for up to 150 minutes weekly; and  sleep for at least 7 hours a day.  - he is advised to maintain close follow up with Dettinger, Elige Radon, MD for primary care needs, as well as his other providers for optimal and coordinated care.      I spent  31  minutes in the care of the patient today including review of labs from CMP, Lipids, Thyroid Function, Hematology (current and previous including abstractions from other facilities); face-to-face time discussing  his blood glucose readings/logs, discussing hypoglycemia and hyperglycemia episodes and symptoms, medications doses, his options of short and long term treatment based on the latest standards of care / guidelines;  discussion about incorporating lifestyle medicine;  and documenting the encounter. Risk reduction counseling performed per USPSTF guidelines to reduce obesity and cardiovascular risk factors.     Please refer to Patient Instructions for Blood Glucose Monitoring and Insulin/Medications Dosing Guide"  in media tab  for additional information. Please  also refer to " Patient Self Inventory" in the Media  tab for reviewed elements of pertinent patient history.  Nicholes Stairs participated in the discussions, expressed understanding, and voiced agreement with the above plans.  All questions were answered to his satisfaction. he is encouraged to contact clinic should he have any questions or concerns prior to his return visit.    Follow up plan: - Return in about 4 months (around 02/28/2023) for Diabetes F/U with A1c in office, No previsit labs, Bring meter and logs.  Ronny Bacon, [facility] [address] [place], [place] [address] Phone: [phone] Fax: [phone]  10/28/2022, 8:21 AM

## 2022-11-12 ENCOUNTER — Telehealth: Payer: Self-pay | Admitting: Radiology

## 2022-11-12 DIAGNOSIS — M720 Palmar fascial fibromatosis [Dupuytren]: Secondary | ICD-10-CM | POA: Diagnosis not present

## 2022-11-12 NOTE — Telephone Encounter (Signed)
Patient called LMVM for me yesterday asking status of proceeding with Xiaflex injections.  I looked and saw note in chart where you had received BV and it stated we were OON.  Has it been re ran yet?  Is it back?  I told patient we are in network and we would call him Monday and hopefully be able to setup his appts and all.  Let me know how I can help!  Thanks!

## 2022-11-15 NOTE — Telephone Encounter (Signed)
Supposedly, Timothy Nelson was going to reach out to [employer], due to them having an issue with [employer].  The benefits were reran.  I called Timothy Nelson on Friday, 11/12/2022, but no answer and I left her a Vm to call me back.

## 2022-11-16 NOTE — Telephone Encounter (Signed)
Yes, Alvino Chapel called me today, she had been on vacation.  She stated that I would have to call [employer] to verify the benefits of the J code and the injection/finger manipulation.   I will have to do that for all [employer] for Xiaflex due to 3rd party restriction.  I will be calling in the morning to see if it requires an authorization

## 2022-11-17 ENCOUNTER — Telehealth: Payer: Self-pay | Admitting: Orthopedic Surgery

## 2022-11-17 ENCOUNTER — Other Ambulatory Visit: Payer: Self-pay | Admitting: Orthopedic Surgery

## 2022-11-17 ENCOUNTER — Other Ambulatory Visit: Payer: Self-pay

## 2022-11-17 DIAGNOSIS — M720 Palmar fascial fibromatosis [Dupuytren]: Secondary | ICD-10-CM

## 2022-11-17 NOTE — Telephone Encounter (Signed)
Talked with patients insurance [employer] and was advised that No PA is required for Xiaflex.

## 2022-11-17 NOTE — Telephone Encounter (Signed)
Left voicemail for patient to call back to schedule his injection for hand. I will need to schedule this based on how it has to be scheduled

## 2022-11-22 ENCOUNTER — Ambulatory Visit: Payer: Medicare HMO | Admitting: Orthopedic Surgery

## 2022-11-22 DIAGNOSIS — M720 Palmar fascial fibromatosis [Dupuytren]: Secondary | ICD-10-CM

## 2022-11-22 NOTE — Progress Notes (Signed)
Timothy Nelson - 57 y.o. male MRN [id number]  Date of birth: 1965-09-25  Office Visit Note: Visit Date: 11/22/2022 PCP: Dettinger, Elige Radon, MD Referred by: Dettinger, Elige Radon, MD  Subjective: No chief complaint on file.  HPI: Timothy Nelson is a pleasant 57 y.o. male who presents today for Xiaflex injection for the right ring finger MP contracture.  Contracture is unchanged from his prior visit.  Pertinent ROS were reviewed with the patient and found to be negative unless otherwise specified above in HPI.     Assessment & Plan: Visit Diagnoses: No diagnosis found.  Plan: Xiaflex injection administered today to the right ring finger MP contracture.  Patient will return Friday this week for manipulation and subsequent fabrication of an orthosis with occupational therapy.  Risk and benefits of the injection once again explained in detail.  Patient expressed full understanding and was comfortable moving forward with injection.  Follow-up: No follow-ups on file.   Meds & Orders: No orders of the defined types were placed in this encounter.  No orders of the defined types were placed in this encounter.    Procedures: Hand/UE Inj: R ring finger (MP contracture) for Dupuytren's contracture on 11/22/2022 8:32 PM Indications: therapeutic Details: 27 G needle, volar approach Medications: (Xiaflex injection provided, appropriate concentration for MP contracture was utilized per manufacturer instructions) Procedure, treatment alternatives, risks and benefits explained, specific risks discussed. Consent was given by the patient. Patient was prepped and draped in the usual sterile fashion.          Clinical History: No specialty comments available.  He reports that he quit smoking about 4 years ago. His smoking use included cigarettes and cigars. He started smoking about 19 years ago. He has a 15 pack-year smoking history. He has never used smokeless tobacco.  Recent Labs     03/03/22 0952 06/02/22 1446 09/13/22 0800  HGBA1C 8.5* 6.4* 7.2*    Objective:   Vital Signs: There were no vitals taken for this visit.  Physical Exam  Gen: Well-appearing, in no acute distress; non-toxic CV: Regular Rate. Well-perfused. Warm.  Resp: Breathing unlabored on room air; no wheezing. Psych: Fluid speech in conversation; appropriate affect; normal thought process  Ortho Exam Right hand: - Palpable cords in line with the ring and small finger - Contracture at the right ring MCP 45 degrees with positive tabletop test  Left hand: - Palpable cords in line with the ring and small finger - Contracture at left ring MCP 45 degrees with positive tabletop test  Imaging: No results found.  Past Medical/Family/Surgical/Social History: Medications & Allergies reviewed per EMR, new medications updated. Patient Active Problem List   Diagnosis Date Noted   SOB (shortness of breath) 08/18/2022   Coronary artery disease involving native coronary artery of native heart without angina pectoris 06/22/2022   Dyslipidemia 06/22/2022   Dupuytren's disease of palm with nodules without contracture 07/14/2021   Mixed hyperlipidemia 10/03/2018   Personal history of diabetic foot ulcer 09/30/2017   Neuropathy 08/05/2017   Diabetes mellitus, type 2 (HCC) 08/05/2017   Class 2 drug-induced obesity with serious comorbidity and body mass index (BMI) of 35.0 to 35.9 in adult 01/19/2017   Snoring 01/19/2017   Excessive daytime sleepiness 01/19/2017   GERD (gastroesophageal reflux disease) 01/09/2015   Hypertension associated with diabetes (HCC) 09/11/2014   Chronic foot pain 09/11/2014   Past Medical History:  Diagnosis Date   Arthritis    Barrett's esophagus    Chronic pain  Diabetes (HCC)    Essential hypertension    Gastroparesis    GERD (gastroesophageal reflux disease)    Hyperlipidemia    Obesity    Type 2 diabetes mellitus (HCC)    Family History  Problem Relation Age of  Onset   Diabetes Mother    Hypertension Mother    Hyperlipidemia Mother    Arthritis Mother    Esophageal cancer Mother        patient unsure   Diabetes Father    Heart disease Father        Age 8   Kidney disease Father    Heart attack Father        Quadruple bypass at age 40   Hypertension Sister    Diabetes Daughter    Colon cancer Neg Hx    Stomach cancer Neg Hx    Pancreatic cancer Neg Hx    Liver disease Neg Hx    Past Surgical History:  Procedure Laterality Date   ESOPHAGOGASTRODUODENOSCOPY (EGD) WITH PROPOFOL N/A 04/04/2015   Procedure: ESOPHAGOGASTRODUODENOSCOPY (EGD) WITH PROPOFOL;  Surgeon: Malissa Hippo, MD;  Location: [facility];  Service: Endoscopy;  Laterality: N/A;  8:30   FRACTURE SURGERY  03/2005   Left arm, left thumb, bilateral legs   HIP SURGERY Right    WRIST FRACTURE SURGERY Left    Social History   Occupational History   Occupation: Disability  Tobacco Use   Smoking status: Former    Current packs/day: 0.00    Average packs/day: 1 pack/day for 15.0 years (15.0 ttl pk-yrs)    Types: Cigarettes, Cigars    Start date: 01/04/2003    Quit date: 01/03/2018    Years since quitting: 4.8   Smokeless tobacco: Never   Tobacco comments:    Smoked intermittently for 20 years; 1-2 cigars weekly as of 02/07/2018  Vaping Use   Vaping status: Never Used  Substance and Sexual Activity   Alcohol use: Yes    Alcohol/week: 3.0 standard drinks of alcohol    Types: 3 Cans of beer per week    Comment: glass wine a week.    Drug use: No   Sexual activity: Yes    Birth control/protection: None    Donzella Carrol Fara Boros) Lada Fulbright, M.D. [facility] 8:30 PM

## 2022-11-24 NOTE — Therapy (Signed)
OUTPATIENT OCCUPATIONAL THERAPY ORTHO EVALUATION  Patient Name: Timothy Nelson MRN: [id number] DOB:31-May-1965, 57 y.o., male Today's Date: 11/26/2022  PCP: France Ravens, MD REFERRING PROVIDER:  Samuella Cota, MD    END OF SESSION:  OT End of Session - 11/26/22 0819     Visit Number 1    Number of Visits 6    Date for OT Re-Evaluation 01/07/23    Authorization Type [employer]    OT Start Time 0822    OT Stop Time 0933    OT Time Calculation (min) 71 min    Equipment Utilized During Treatment orthotic materials    Activity Tolerance Patient tolerated treatment well;No increased pain;Patient limited by fatigue    Behavior During Therapy [facility] for tasks assessed/performed             Past Medical History:  Diagnosis Date   Arthritis    Barrett's esophagus    Chronic pain    Diabetes (HCC)    Essential hypertension    Gastroparesis    GERD (gastroesophageal reflux disease)    Hyperlipidemia    Obesity    Type 2 diabetes mellitus (HCC)    Past Surgical History:  Procedure Laterality Date   ESOPHAGOGASTRODUODENOSCOPY (EGD) WITH PROPOFOL N/A 04/04/2015   Procedure: ESOPHAGOGASTRODUODENOSCOPY (EGD) WITH PROPOFOL;  Surgeon: Malissa Hippo, MD;  Location: [facility];  Service: Endoscopy;  Laterality: N/A;  8:30   FRACTURE SURGERY  03/2005   Left arm, left thumb, bilateral legs   HIP SURGERY Right    WRIST FRACTURE SURGERY Left    Patient Active Problem List   Diagnosis Date Noted   SOB (shortness of breath) 08/18/2022   Coronary artery disease involving native coronary artery of native heart without angina pectoris 06/22/2022   Dyslipidemia 06/22/2022   Dupuytren's disease of palm with nodules without contracture 07/14/2021   Mixed hyperlipidemia 10/03/2018   Personal history of diabetic foot ulcer 09/30/2017   Neuropathy 08/05/2017   Diabetes mellitus, type 2 (HCC) 08/05/2017   Class 2 drug-induced obesity with serious comorbidity and body mass index (BMI)  of 35.0 to 35.9 in adult 01/19/2017   Snoring 01/19/2017   Excessive daytime sleepiness 01/19/2017   GERD (gastroesophageal reflux disease) 01/09/2015   Hypertension associated with diabetes (HCC) 09/11/2014   Chronic foot pain 09/11/2014    ONSET DATE:  Manipulation: 11/26/22  REFERRING DIAG: M72.0 (ICD-10-CM) - Dupuytren's contracture of both hands   THERAPY DIAG:  Stiffness of right hand, not elsewhere classified - Plan: Ot plan of care cert/re-cert  Pain in right hand - Plan: Ot plan of care cert/re-cert  Muscle weakness (generalized) - Plan: Ot plan of care cert/re-cert  Localized edema - Plan: Ot plan of care cert/re-cert  Stiffness of left hand, not elsewhere classified - Plan: Ot plan of care cert/re-cert  Rationale for Evaluation and Treatment: Rehabilitation  SUBJECTIVE:   SUBJECTIVE STATEMENT: He states he had his hand contracture for about a year and a half.  He has a matching 1 in the left ring finger.  His MP joint has been drooping and flexed down about 30 degrees chronically, and today after his manipulation he still appears to be drooping about 30 degrees at the MP joint with the IP joint straight.  He is overtly frustrated and voices this frustration.  His pain is very low but he also seems to state low motivation for therapy or possible correction of this issue.  He does believe that Dupuytren's emerged after he had a carpal  tunnel releases bilaterally (he blames the surgery for the development of Dupuytren's).  He wishes to avoid any future surgeries feeling like they will exacerbate his problems.  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  He never got therapy after his carpal tunnel releases   PERTINENT HISTORY: Splint; s/p Dupuytren's manipulation; Rt RF MP contracture, s/p injection and manipulation.   PRECAUTIONS: None; RED FLAGS: None   WEIGHT BEARING RESTRICTIONS: Yes <5# recommended for next month    PAIN:  Are you having pain? No none significant at  the moment    FALLS: Has patient fallen in last 6 months? No  PLOF: Independent  PATIENT GOALS: To improve the drooping MP joint in his right hand ring finger  NEXT MD VISIT: TBD   OBJECTIVE: (All objective assessments below are from initial evaluation on: 11/26/22 unless otherwise specified.)   HAND DOMINANCE: Right   ADLs: Overall ADLs: States very little functional issues, more annoyance that he cannot extend his ring fingers which sometimes interferes with his grasping ability with home tasks and work tasks.  He also has concerns that his abilities are decreasing over time   FUNCTIONAL OUTCOME MEASURES: Eval: Quick DASH 22% impairment today  (Higher % Score  =  More Impairment)     UPPER EXTREMITY ROM     Shoulder to Wrist AROM Right eval  Wrist flexion 55  Wrist extension 61  (Blank rows = not tested)   Hand AROM Right eval Left eval  Full Fist Ability (or Gap to Distal Palmar Crease) Fingers touch palm Fingers touch palm  Thumb Opposition  (Kapandji Scale)  9/10   Ring MCP (0-90) (-30) - 77   (-17) - 81*  Ring PIP (0-100) 0 - 80    Ring DIP (0-70) 0 -  62    (Blank rows = not tested)   HAND FUNCTION: Eval: Observed weakness in affected Rt hand.  Details will be tested when appropriate and safe Grip strength Right: TBD lbs, Left: TBD lbs   COORDINATION: Eval: Observed coordination impairments with affected Rt hand.  Details will be tested when safe and appropriate 9 Hole Peg Test Right: TBDsec, Left: TBD sec (TBD sec is WFL)   SENSATION: Eval:  Light touch intact today, though diminished around injection/manipulation area    EDEMA:   Eval:  Mildly swollen in right hand volarly proximal to the ring finger today  COGNITION: Eval: Overall cognitive status: WFL for evaluation today   OBSERVATIONS:   Eval: He does appear to have full extension of the IP joints and good flexion but missing about 30 degrees of MP joint extension today and feeling  significantly tight when passively pulling back into extension.  He has some swelling which is typical in the right hand.  His left hand also seems to be bulky and have a forming Dupuytren's contracture and is also status post carpal tunnel release more than a year or 2 ago.    TODAY'S TREATMENT:  Post-evaluation treatment:   Today he was given self-care and safety education to do no significant or painful or repetitive heavy gripping pushing or pulling for the next 4 weeks.  He was told to keep his injection and manipulation area clean, and that he needs to wear an extension orthosis for the next 2 weeks at all times except for hygiene and exercises.  After that time period, he was told he should continue to wear it in the night for up to 4 months or until he can  maintain full extension of his fingers without regression.  He was told to withhold any kind of grip training or sporting activities for 4 to 6 weeks.  He was given education on body functions and structures in terms of his diseased fascia, and also how we can use conservative means like stretches and longer-term bracing to help correct these issues and try to improve his motion.   Custom orthotic fabrication was indicated due to pt's recent manipulation of Dupuytren's contracture and need for safe, functional positioning. OT fabricated custom hand-based finger extension orthosis for pt today to help stretch and maintain a length and extended position of the right hand ring finger. It fit well with no areas of painful pressure, pt states a comfortable fit. Pt was educated on the wearing schedule (on at all times except for hygiene and exercises), to avoid exposing it to sources of heat, to wipe clean as needed (do not wash, use harsh detergents), to call or come in ASAP if it is causing any irritation or is not achieving desired function. It will be checked/adjusted in upcoming sessions, as needed. Pt states understanding all directions.     Additionally OT provides him with the following home exercise program to do nonpainful he 3-4 times a day after removing his orthosis to maintain his mobility during this healing phase.  These exercises were demonstrated to him, explained to him, and he performs back showing no significant pain.   Exercises To start immediately: - Tendon Glides  - 4-6 x daily - 3-5 reps - 2-3 seconds hold - Seated Single Finger Extension  - 4-6 x daily - 10-15 reps Wait ~1 week before any stretches  - Wrist Prayer Stretch  - 4 x daily - 3-5 reps - 15 sec hold - PUSH KNUCKLES DOWN  - 4 x daily - 3-5 reps - 15 seconds hold - HOOK Stretch  - 4 x daily - 3-5 reps - 15-20 sec hold  He leaves in no significant pain stating no further questions.   PATIENT EDUCATION: Education details: See tx section above for details  Person educated: Patient Education method: Verbal Instruction, Teach back, Handouts  Education comprehension: States and demonstrates understanding, Additional Education required    HOME EXERCISE PROGRAM: Access Code: [email or web address] URL: [email or web address] Date: 11/26/2022 Prepared by: Fannie Knee   GOALS: Goals reviewed with patient? Yes   SHORT TERM GOALS: (STG required if POC>30 days) Target Date: 12/03/22  Pt will obtain protective, custom orthotic. Goal status: 11/26/22: MET   2.  Pt will demo/state understanding of initial HEP to improve pain levels and prerequisite motion. Goal status: INITIAL   LONG TERM GOALS: Target Date: 01/07/23  Pt will improve functional ability by decreased impairment per Quick DASH assessment from 22% to 10% or better, for better quality of life. Goal status: INITIAL  2.  Pt will improve grip strength in Rt hand from unsafe to at least 50lbs for functional use at home and in IADLs. Goal status: INITIAL  3.  Pt will improve A/ROM in Rt hand RF MP ext from (-30*) to at least (-10*), to have functional motion for tasks like  reach and grasp.  Goal status: INITIAL   ASSESSMENT:  CLINICAL IMPRESSION: Patient is a 57 y.o. male who was seen today for occupational therapy evaluation for stiffness, pain, swelling and weakness in his right dominant hand after Dupuytren's contracture manipulation today.  He was somewhat disappointed with the outcome of manipulation today and states no significant visual improvement and  he states decreased motivation to attend therapy.  OT did feel that therapy was helpful today to provide orthosis, exercises, recommendations and may be helpful in the future to help the left hand conservatively as well as try other methods to improve his extension lag at the MP joint.  He will consider this.   PERFORMANCE DEFICITS: in functional skills including IADLs, coordination, edema, ROM, strength, pain, fascial restrictions, flexibility, body mechanics, decreased knowledge of precautions, wound, and UE functional use, cognitive skills including energy/drive, problem solving, and safety awareness, and psychosocial skills including coping strategies, environmental adaptation, and habits.   IMPAIRMENTS: are limiting patient from IADLs, work, and leisure.   COMORBIDITIES: may have co-morbidities  that affects occupational performance. Patient will benefit from skilled OT to address above impairments and improve overall function.  MODIFICATION OR ASSISTANCE TO COMPLETE EVALUATION: No modification of tasks or assist necessary to complete an evaluation.  OT OCCUPATIONAL PROFILE AND HISTORY: Problem focused assessment: Including review of records relating to presenting problem.  CLINICAL DECISION MAKING: Moderate - several treatment options, min-mod task modification necessary  REHAB POTENTIAL: Good  EVALUATION COMPLEXITY: Low      PLAN:  OT FREQUENCY: 1-2x/week  OT DURATION: 6 weeks through 01/07/2023 and up to 6 total visits as needed  PLANNED INTERVENTIONS: 97168 OT Re-evaluation, 97535 self  care/ADL training, 29562 therapeutic exercise, 97530 therapeutic activity, 97140 manual therapy, 97035 ultrasound, 97010 moist heat, 97010 cryotherapy, 97034 contrast bath, 97760 Orthotics management and training, 13086 Splinting (initial encounter), [phone] Subsequent splinting/medication, scar mobilization, passive range of motion, compression bandaging, Dry needling, coping strategies training, and patient/family education  RECOMMENDED OTHER SERVICES: None now  CONSULTED AND AGREED WITH PLAN OF CARE: Patient  PLAN FOR NEXT SESSION:   Follow-up with the patient as he needs to help adjust orthoses, check status, upgrade to strengthening as appropriate, designed dynamic orthoses to help regain motion, etc.  He stated that he may not return to therapy but it was offered for him as he has had this contracture for over a year and would benefit from therapy services most likely   Fannie Knee, OTR/L, CHT 11/26/2022, 12:47 PM    Referring diagnosis? M72.0 (ICD-10-CM) - Dupuytren's contracture of both hands  Treatment diagnosis? (if different than referring diagnosis) M79.641, M25.641, R60.0, M25.642 What was this (referring dx) caused by? [x]  Surgery []  Fall [x]  Ongoing issue []  Arthritis []  Other: ____________  Laterality: []  Rt []  Lt [x]  Both  Check all possible CPT codes:  *CHOOSE 10 OR LESS*    See Planned Interventions listed in the Plan section of the Evaluation.

## 2022-11-26 ENCOUNTER — Other Ambulatory Visit: Payer: Self-pay

## 2022-11-26 ENCOUNTER — Encounter: Payer: Self-pay | Admitting: Rehabilitative and Restorative Service Providers"

## 2022-11-26 ENCOUNTER — Ambulatory Visit: Payer: Medicare HMO | Admitting: Orthopedic Surgery

## 2022-11-26 ENCOUNTER — Ambulatory Visit: Payer: Medicare HMO | Admitting: Rehabilitative and Restorative Service Providers"

## 2022-11-26 DIAGNOSIS — M25641 Stiffness of right hand, not elsewhere classified: Secondary | ICD-10-CM | POA: Diagnosis not present

## 2022-11-26 DIAGNOSIS — R600 Localized edema: Secondary | ICD-10-CM

## 2022-11-26 DIAGNOSIS — M720 Palmar fascial fibromatosis [Dupuytren]: Secondary | ICD-10-CM

## 2022-11-26 DIAGNOSIS — M6281 Muscle weakness (generalized): Secondary | ICD-10-CM

## 2022-11-26 DIAGNOSIS — M25642 Stiffness of left hand, not elsewhere classified: Secondary | ICD-10-CM

## 2022-11-26 DIAGNOSIS — M79641 Pain in right hand: Secondary | ICD-10-CM

## 2022-11-26 NOTE — Progress Notes (Signed)
Raygan Chinchilla - 57 y.o. male MRN [id number]  Date of birth: 1965/04/18  Office Visit Note: Visit Date: 11/26/2022 PCP: Dettinger, Elige Radon, MD Referred by: Dettinger, Elige Radon, MD  Subjective: No chief complaint on file.  HPI: Samad Mccaskey is a pleasant 57 y.o. male who presents today for manipulation of right ring finger status post Xiaflex injection for the right ring finger MP contracture.  Contracture is unchanged from his prior visit.  Pertinent ROS were reviewed with the patient and found to be negative unless otherwise specified above in HPI.     Assessment & Plan: Visit Diagnoses: No diagnosis found.  Plan: Manipulation performed today without incident, notable correction to near neutral of the right ring finger MP.  He will be seen by occupational therapy this morning for fabrication of an orthosis.  He is instructed to wear the orthosis full-time for at least 2 weeks, can transition to night splinting afterwards.  Recommend follow-up in approximate 6 weeks.  Risk and benefits of the injection and manipulation once again explained in detail.  Patient expressed full understanding and was comfortable moving forward with manipulation.  Follow-up: No follow-ups on file.   Meds & Orders: No orders of the defined types were placed in this encounter.  No orders of the defined types were placed in this encounter.    Procedures: Site was prepped and marked using standard sterile technique.  7 cc of 1% lidocaine plain was utilized for anesthetic purposes at the manipulation site.  Gentle maneuver was performed of the right ring finger to correct the MP contracture, notable correction was appreciated to near neutral at the MP joint.  No evidence of skin tearing.  Patient was able to flex and extend digit postmanipulation without issue, hand remains warm and well-perfused, sensation intact.  Procedure was tolerated well.  Patient instructed to move upstairs to occupational therapy for  fabrication of an orthosis.     Clinical History: No specialty comments available.  He reports that he quit smoking about 4 years ago. His smoking use included cigarettes and cigars. He started smoking about 19 years ago. He has a 15 pack-year smoking history. He has never used smokeless tobacco.  Recent Labs    03/03/22 0952 06/02/22 1446 09/13/22 0800  HGBA1C 8.5* 6.4* 7.2*    Objective:   Vital Signs: There were no vitals taken for this visit.  Physical Exam  Gen: Well-appearing, in no acute distress; non-toxic CV: Regular Rate. Well-perfused. Warm.  Resp: Breathing unlabored on room air; no wheezing. Psych: Fluid speech in conversation; appropriate affect; normal thought process  Ortho Exam Right hand: Premanipulation - Palpable cords in line with the ring and small finger - Contracture at the right ring MCP 45 degrees with positive tabletop test  Right hand: Postmanipulation - Contracture at right ring MCP near neutral, no skin tearing, able to perform composite fist, sensation remains intact distally, hand is warm well-perfused  Left hand: - Palpable cords in line with the ring and small finger - Contracture at left ring MCP 45 degrees with positive tabletop test  Imaging: No results found.  Past Medical/Family/Surgical/Social History: Medications & Allergies reviewed per EMR, new medications updated. Patient Active Problem List   Diagnosis Date Noted   SOB (shortness of breath) 08/18/2022   Coronary artery disease involving native coronary artery of native heart without angina pectoris 06/22/2022   Dyslipidemia 06/22/2022   Dupuytren's disease of palm with nodules without contracture 07/14/2021   Mixed hyperlipidemia 10/03/2018  Personal history of diabetic foot ulcer 09/30/2017   Neuropathy 08/05/2017   Diabetes mellitus, type 2 (HCC) 08/05/2017   Class 2 drug-induced obesity with serious comorbidity and body mass index (BMI) of 35.0 to 35.9 in adult  01/19/2017   Snoring 01/19/2017   Excessive daytime sleepiness 01/19/2017   GERD (gastroesophageal reflux disease) 01/09/2015   Hypertension associated with diabetes (HCC) 09/11/2014   Chronic foot pain 09/11/2014   Past Medical History:  Diagnosis Date   Arthritis    Barrett's esophagus    Chronic pain    Diabetes (HCC)    Essential hypertension    Gastroparesis    GERD (gastroesophageal reflux disease)    Hyperlipidemia    Obesity    Type 2 diabetes mellitus (HCC)    Family History  Problem Relation Age of Onset   Diabetes Mother    Hypertension Mother    Hyperlipidemia Mother    Arthritis Mother    Esophageal cancer Mother        patient unsure   Diabetes Father    Heart disease Father        Age 105   Kidney disease Father    Heart attack Father        Quadruple bypass at age 74   Hypertension Sister    Diabetes Daughter    Colon cancer Neg Hx    Stomach cancer Neg Hx    Pancreatic cancer Neg Hx    Liver disease Neg Hx    Past Surgical History:  Procedure Laterality Date   ESOPHAGOGASTRODUODENOSCOPY (EGD) WITH PROPOFOL N/A 04/04/2015   Procedure: ESOPHAGOGASTRODUODENOSCOPY (EGD) WITH PROPOFOL;  Surgeon: Malissa Hippo, MD;  Location: [facility];  Service: Endoscopy;  Laterality: N/A;  8:30   FRACTURE SURGERY  03/2005   Left arm, left thumb, bilateral legs   HIP SURGERY Right    WRIST FRACTURE SURGERY Left    Social History   Occupational History   Occupation: Disability  Tobacco Use   Smoking status: Former    Current packs/day: 0.00    Average packs/day: 1 pack/day for 15.0 years (15.0 ttl pk-yrs)    Types: Cigarettes, Cigars    Start date: 01/04/2003    Quit date: 01/03/2018    Years since quitting: 4.8   Smokeless tobacco: Never   Tobacco comments:    Smoked intermittently for 20 years; 1-2 cigars weekly as of 02/07/2018  Vaping Use   Vaping status: Never Used  Substance and Sexual Activity   Alcohol use: Yes    Alcohol/week: 3.0 standard  drinks of alcohol    Types: 3 Cans of beer per week    Comment: glass wine a week.    Drug use: No   Sexual activity: Yes    Birth control/protection: None    Dina Mobley Fara Boros) Trice Aspinall, M.D. [facility] 8:18 AM

## 2022-12-24 ENCOUNTER — Ambulatory Visit (INDEPENDENT_AMBULATORY_CARE_PROVIDER_SITE_OTHER): Payer: Medicare HMO | Admitting: Family Medicine

## 2022-12-24 ENCOUNTER — Encounter: Payer: Self-pay | Admitting: Family Medicine

## 2022-12-24 ENCOUNTER — Other Ambulatory Visit: Payer: Self-pay | Admitting: Family Medicine

## 2022-12-24 VITALS — BP 145/82 | HR 79 | Ht 73.0 in | Wt 261.0 lb

## 2022-12-24 DIAGNOSIS — I152 Hypertension secondary to endocrine disorders: Secondary | ICD-10-CM | POA: Diagnosis not present

## 2022-12-24 DIAGNOSIS — M79671 Pain in right foot: Secondary | ICD-10-CM

## 2022-12-24 DIAGNOSIS — E1169 Type 2 diabetes mellitus with other specified complication: Secondary | ICD-10-CM

## 2022-12-24 DIAGNOSIS — Z7985 Long-term (current) use of injectable non-insulin antidiabetic drugs: Secondary | ICD-10-CM

## 2022-12-24 DIAGNOSIS — E782 Mixed hyperlipidemia: Secondary | ICD-10-CM

## 2022-12-24 DIAGNOSIS — E1159 Type 2 diabetes mellitus with other circulatory complications: Secondary | ICD-10-CM

## 2022-12-24 DIAGNOSIS — G8929 Other chronic pain: Secondary | ICD-10-CM

## 2022-12-24 LAB — LIPID PANEL

## 2022-12-24 LAB — BAYER DCA HB A1C WAIVED: HB A1C (BAYER DCA - WAIVED): 7.9 % — ABNORMAL HIGH (ref 4.8–5.6)

## 2022-12-24 MED ORDER — OXYCODONE HCL 10 MG PO TABS: 10.0000 mg | ORAL_TABLET | Freq: Four times a day (QID) | ORAL | 0 refills | Status: DC | PRN

## 2022-12-24 MED ORDER — OXYCODONE HCL 10 MG PO TABS
10.0000 mg | ORAL_TABLET | Freq: Four times a day (QID) | ORAL | 0 refills | Status: DC | PRN
Start: 2023-01-23 — End: 2023-03-24

## 2022-12-24 MED ORDER — LISINOPRIL 20 MG PO TABS
20.0000 mg | ORAL_TABLET | Freq: Every day | ORAL | 3 refills | Status: DC
Start: 2022-12-24 — End: 2022-12-24

## 2022-12-24 MED ORDER — SIMVASTATIN 40 MG PO TABS: 40.0000 mg | ORAL_TABLET | Freq: Every day | ORAL | 3 refills | Status: DC

## 2022-12-24 MED ORDER — SIMVASTATIN 40 MG PO TABS
40.0000 mg | ORAL_TABLET | Freq: Every day | ORAL | 3 refills | Status: DC
Start: 2022-12-24 — End: 2022-12-24

## 2022-12-24 MED ORDER — LISINOPRIL 20 MG PO TABS: 20.0000 mg | ORAL_TABLET | Freq: Every day | ORAL | 3 refills | Status: DC

## 2022-12-24 NOTE — Addendum Note (Signed)
Addended by: Dorene Sorrow on: 12/24/2022 11:07 AM   Modules accepted: Orders

## 2022-12-24 NOTE — Progress Notes (Signed)
BP (!) 145/82   Pulse 79   Ht 6\' 1"  (1.854 m)   Wt 261 lb (118.4 kg)   SpO2 95%   BMI 34.43 kg/m    Subjective:   Patient ID: Timothy Nelson, male    DOB: 1965/04/30, 57 y.o.   MRN: [id number]  HPI: Timothy Nelson is a 57 y.o. male presenting on 12/24/2022 for Medical Management of Chronic Issues   HPI Type 2 diabetes mellitus Patient comes in today for recheck of his diabetes. Patient has been currently taking Jardiance and Trulicity and glipizide. Patient is currently on an ACE inhibitor/ARB. Patient has seen an ophthalmologist this year. Patient denies any new issues with their feet. The symptom started onset as an adult hypertension and hyperlipidemia ARE RELATED TO DM   Hypertension Patient is currently on lisinopril, and their blood pressure today is 145/82 and 117/72 on recheck. Patient denies any lightheadedness or dizziness. Patient denies headaches, blurred vision, chest pains, shortness of breath, or weakness. Denies any side effects from medication and is content with current medication.   Hyperlipidemia Patient is coming in for recheck of his hyperlipidemia. The patient is currently taking simvastatin. They deny any issues with myalgias or history of liver damage from it. They deny any focal numbness or weakness or chest pain.   Pain assessment: Cause of pain-bilateral ankle deformities Pain location-both ankles Pain on scale of 1-10- 5 Frequency-daily What increases pain-being on his feet or moving or being active. What makes pain Better-medication and rest Effects on ADL -minimal Any change in general medical condition-none  Current opioids rx-oxycodone 10 mg 4 times daily as needed # meds rx-120/month Effectiveness of current meds-works well Adverse reactions from pain meds-none Morphine equivalent-60  Pill count performed-No Last drug screen -03/05/2022 ( high risk q42m, moderate risk q4m, low risk yearly ) Urine drug screen today- No Was the NCCSR  reviewed-yes  If yes were their any concerning findings? -None Pain contract signed on: 03/05/2022  Relevant past medical, surgical, family and social history reviewed and updated as indicated. Interim medical history since our last visit reviewed. Allergies and medications reviewed and updated.  Review of Systems  Constitutional:  Negative for chills and fever.  Eyes:  Negative for visual disturbance.  Respiratory:  Negative for shortness of breath and wheezing.   Cardiovascular:  Negative for chest pain and leg swelling.  Musculoskeletal:  Positive for arthralgias and gait problem. Negative for back pain.  Skin:  Negative for rash.  Neurological:  Negative for dizziness and light-headedness.  All other systems reviewed and are negative.   Per HPI unless specifically indicated above   Allergies as of 12/24/2022       Reactions   Fenofibrate Rash   Permament rash.        Medication List        Accurate as of December 24, 2022  8:21 AM. If you have any questions, ask your nurse or doctor.          Accu-Chek Guide w/Device Kit Test BS TID Dx E11.9   Accu-Chek Softclix Lancets lancets Test BS TID Dx E11.9   empagliflozin 25 MG Tabs tablet Commonly known as: Jardiance Take 1 tablet (25 mg total) by mouth daily before breakfast.   Fish Oil 1200 MG Cpdr Take 1,200 mg by mouth daily.   fluticasone 50 MCG/ACT nasal spray Commonly known as: FLONASE USE 1 SPRAY IN EACH NOSTRIL TWICE DAILY AS NEEDED FOR ALLERGIES OR RHINITIS (SUBSTITUTED FOR FLONASE)   glipiZIDE  5 MG 24 hr tablet Commonly known as: GLUCOTROL XL Take 1 tablet (5 mg total) by mouth daily with breakfast.   glucose blood test strip Test BS TID Dx E11.9   ibuprofen 600 MG tablet Commonly known as: ADVIL Take 1 tablet (600 mg total) by mouth every 8 (eight) hours as needed for mild pain or moderate pain.   ketoconazole 2 % cream Commonly known as: NIZORAL Apply 1 application topically daily.    lisinopril 20 MG tablet Commonly known as: ZESTRIL Take 1 tablet (20 mg total) by mouth daily.   metoCLOPramide 5 MG tablet Commonly known as: REGLAN Take 5 mg by mouth 3 (three) times daily.   omeprazole 40 MG capsule Commonly known as: PRILOSEC Take 1 capsule (40 mg total) by mouth daily.   Oxycodone HCl 10 MG Tabs Take 1 tablet (10 mg total) by mouth 4 (four) times daily as needed. What changed: Another medication with the same name was changed. Make sure you understand how and when to take each. Changed by: Elige Radon Shavy Beachem   Oxycodone HCl 10 MG Tabs Take 1 tablet (10 mg total) by mouth 4 (four) times daily as needed. Start taking on: January 23, 2023 What changed: These instructions start on January 23, 2023. If you are unsure what to do until then, ask your doctor or other care provider. Changed by: Elige Radon Frederick Klinger   Oxycodone HCl 10 MG Tabs Take 1 tablet (10 mg total) by mouth 4 (four) times daily as needed. Start taking on: February 22, 2023 What changed: These instructions start on February 22, 2023. If you are unsure what to do until then, ask your doctor or other care provider. Changed by: Elige Radon Shrihaan Porzio   simvastatin 40 MG tablet Commonly known as: ZOCOR Take 1 tablet (40 mg total) by mouth daily at 6 PM.   triamcinolone cream 0.1 % Commonly known as: KENALOG Apply 1 application topically 2 (two) times daily.   Trulicity 4.5 MG/0.5ML Soaj Generic drug: Dulaglutide Inject 4.5 mg as directed once a week.         Objective:   BP (!) 145/82   Pulse 79   Ht 6\' 1"  (1.854 m)   Wt 261 lb (118.4 kg)   SpO2 95%   BMI 34.43 kg/m   Wt Readings from Last 3 Encounters:  12/24/22 261 lb (118.4 kg)  10/28/22 259 lb 3.2 oz (117.6 kg)  10/12/22 259 lb (117.5 kg)    Physical Exam Vitals and nursing note reviewed.  Constitutional:      General: He is not in acute distress.    Appearance: He is well-developed. He is not diaphoretic.  Eyes:      General: No scleral icterus.    Conjunctiva/sclera: Conjunctivae normal.  Neck:     Thyroid: No thyromegaly.  Cardiovascular:     Rate and Rhythm: Normal rate and regular rhythm.     Heart sounds: Normal heart sounds. No murmur heard. Pulmonary:     Effort: Pulmonary effort is normal. No respiratory distress.     Breath sounds: Normal breath sounds. No wheezing.  Musculoskeletal:        General: No swelling.     Cervical back: Neck supple.  Lymphadenopathy:     Cervical: No cervical adenopathy.  Skin:    General: Skin is warm and dry.     Findings: No rash.  Neurological:     Mental Status: He is alert and oriented to person, place, and time.  Coordination: Coordination normal.  Psychiatric:        Behavior: Behavior normal.       Assessment & Plan:   Problem List Items Addressed This Visit       Cardiovascular and Mediastinum   Hypertension associated with diabetes (HCC)   Relevant Medications   lisinopril (ZESTRIL) 20 MG tablet   simvastatin (ZOCOR) 40 MG tablet   Other Relevant Orders   CMP14+EGFR     Endocrine   Diabetes mellitus, type 2 (HCC) - Primary   Relevant Medications   lisinopril (ZESTRIL) 20 MG tablet   simvastatin (ZOCOR) 40 MG tablet   Other Relevant Orders   CBC with Differential/Platelet   Bayer DCA Hb A1c Waived     Other   Chronic foot pain   Relevant Medications   Oxycodone HCl 10 MG TABS (Start on 01/23/2023)   Oxycodone HCl 10 MG TABS (Start on 02/22/2023)   Oxycodone HCl 10 MG TABS   Mixed hyperlipidemia   Relevant Medications   lisinopril (ZESTRIL) 20 MG tablet   simvastatin (ZOCOR) 40 MG tablet   Other Relevant Orders   CMP14+EGFR   Lipid panel  A1c 7.9, slightly elevated, with the holidays he admits he has been a little different, he is can refocus on trying to exercise more and get down.  Follow up plan: Return in about 3 months (around 03/24/2023), or if symptoms worsen or fail to improve, for Diabetes and pain  management.  Counseling provided for all of the vaccine components Orders Placed This Encounter  Procedures   CBC with Differential/Platelet   CMP14+EGFR   Lipid panel   Bayer DCA Hb A1c Waived    Arville Care, MD [place] [facility] 12/24/2022, 8:21 AM

## 2022-12-25 LAB — CBC WITH DIFFERENTIAL/PLATELET
Basophils Absolute: 0 10*3/uL (ref 0.0–0.2)
Basos: 1 %
EOS (ABSOLUTE): 0.1 10*3/uL (ref 0.0–0.4)
Eos: 2 %
Hematocrit: 52.1 % — ABNORMAL HIGH (ref 37.5–51.0)
Hemoglobin: 17.3 g/dL (ref 13.0–17.7)
Immature Grans (Abs): 0 10*3/uL (ref 0.0–0.1)
Immature Granulocytes: 0 %
Lymphocytes Absolute: 1.4 10*3/uL (ref 0.7–3.1)
Lymphs: 21 %
MCH: 31.2 pg (ref 26.6–33.0)
MCHC: 33.2 g/dL (ref 31.5–35.7)
MCV: 94 fL (ref 79–97)
Monocytes Absolute: 0.5 10*3/uL (ref 0.1–0.9)
Monocytes: 8 %
Neutrophils Absolute: 4.5 10*3/uL (ref 1.4–7.0)
Neutrophils: 68 %
Platelets: 192 10*3/uL (ref 150–450)
RBC: 5.54 x10E6/uL (ref 4.14–5.80)
RDW: 12.6 % (ref 11.6–15.4)
WBC: 6.6 10*3/uL (ref 3.4–10.8)

## 2022-12-25 LAB — CMP14+EGFR
ALT: 64 [IU]/L — ABNORMAL HIGH (ref 0–44)
AST: 33 [IU]/L (ref 0–40)
Albumin: 4.4 g/dL (ref 3.8–4.9)
Alkaline Phosphatase: 128 [IU]/L — ABNORMAL HIGH (ref 44–121)
BUN/Creatinine Ratio: 13 (ref 9–20)
BUN: 16 mg/dL (ref 6–24)
Bilirubin Total: 0.3 mg/dL (ref 0.0–1.2)
CO2: 21 mmol/L (ref 20–29)
Calcium: 9.9 mg/dL (ref 8.7–10.2)
Chloride: 102 mmol/L (ref 96–106)
Creatinine, Ser: 1.23 mg/dL (ref 0.76–1.27)
Globulin, Total: 2.4 g/dL (ref 1.5–4.5)
Glucose: 250 mg/dL — ABNORMAL HIGH (ref 70–99)
Potassium: 4.7 mmol/L (ref 3.5–5.2)
Sodium: 141 mmol/L (ref 134–144)
Total Protein: 6.8 g/dL (ref 6.0–8.5)
eGFR: 68 mL/min/{1.73_m2} (ref >59)

## 2022-12-25 LAB — LIPID PANEL
Cholesterol, Total: 169 mg/dL (ref 100–199)
HDL: 39 mg/dL — ABNORMAL LOW (ref >39)
LDL CALC COMMENT:: 4.3 ratio (ref 0.0–5.0)
LDL Chol Calc (NIH): 74 mg/dL (ref 0–99)
Triglycerides: 351 mg/dL — ABNORMAL HIGH (ref 0–149)
VLDL Cholesterol Cal: 56 mg/dL — ABNORMAL HIGH (ref 5–40)

## 2023-01-02 ENCOUNTER — Other Ambulatory Visit: Payer: Self-pay | Admitting: Nurse Practitioner

## 2023-01-02 DIAGNOSIS — E1169 Type 2 diabetes mellitus with other specified complication: Secondary | ICD-10-CM

## 2023-02-02 ENCOUNTER — Other Ambulatory Visit: Payer: Self-pay | Admitting: Family Medicine

## 2023-02-04 ENCOUNTER — Encounter: Payer: Self-pay | Admitting: Family Medicine

## 2023-02-07 ENCOUNTER — Other Ambulatory Visit: Payer: Self-pay | Admitting: Nurse Practitioner

## 2023-02-08 ENCOUNTER — Other Ambulatory Visit (HOSPITAL_COMMUNITY): Payer: Self-pay

## 2023-02-08 ENCOUNTER — Other Ambulatory Visit: Payer: Self-pay | Admitting: Family Medicine

## 2023-02-08 ENCOUNTER — Telehealth: Payer: Self-pay

## 2023-02-08 DIAGNOSIS — G8929 Other chronic pain: Secondary | ICD-10-CM

## 2023-02-08 NOTE — Telephone Encounter (Signed)
PA request has been Started. New Encounter created for follow up. For additional info see Pharmacy Prior Auth telephone encounter from 02/04.

## 2023-02-08 NOTE — Telephone Encounter (Signed)
*  Primary  Pharmacy Patient Advocate Encounter   Received notification from Patient Advice Request messages that prior authorization for Trulicity  is required/requested.   Insurance verification completed.   The patient is insured through [employer] .   Per test claim: Refill too soon. PA is not needed at this time. Medication was filled 02/08/2023. Next eligible fill date is 04/19/2023.

## 2023-02-10 DIAGNOSIS — L859 Epidermal thickening, unspecified: Secondary | ICD-10-CM | POA: Diagnosis not present

## 2023-02-10 DIAGNOSIS — L602 Onychogryphosis: Secondary | ICD-10-CM | POA: Diagnosis not present

## 2023-02-10 DIAGNOSIS — M2042 Other hammer toe(s) (acquired), left foot: Secondary | ICD-10-CM | POA: Diagnosis not present

## 2023-02-10 DIAGNOSIS — R2689 Other abnormalities of gait and mobility: Secondary | ICD-10-CM | POA: Diagnosis not present

## 2023-02-10 DIAGNOSIS — E1142 Type 2 diabetes mellitus with diabetic polyneuropathy: Secondary | ICD-10-CM | POA: Diagnosis not present

## 2023-02-10 DIAGNOSIS — M2041 Other hammer toe(s) (acquired), right foot: Secondary | ICD-10-CM | POA: Diagnosis not present

## 2023-02-28 ENCOUNTER — Ambulatory Visit: Payer: Medicare HMO | Admitting: Nurse Practitioner

## 2023-03-18 ENCOUNTER — Other Ambulatory Visit: Payer: Self-pay | Admitting: Nurse Practitioner

## 2023-03-18 DIAGNOSIS — E1169 Type 2 diabetes mellitus with other specified complication: Secondary | ICD-10-CM

## 2023-03-24 ENCOUNTER — Ambulatory Visit: Payer: Medicare HMO | Admitting: Family Medicine

## 2023-03-24 ENCOUNTER — Encounter: Payer: Self-pay | Admitting: Family Medicine

## 2023-03-24 VITALS — BP 128/78 | HR 74 | Ht 73.0 in | Wt 261.0 lb

## 2023-03-24 DIAGNOSIS — G8929 Other chronic pain: Secondary | ICD-10-CM

## 2023-03-24 DIAGNOSIS — Z7984 Long term (current) use of oral hypoglycemic drugs: Secondary | ICD-10-CM | POA: Diagnosis not present

## 2023-03-24 DIAGNOSIS — Z7985 Long-term (current) use of injectable non-insulin antidiabetic drugs: Secondary | ICD-10-CM

## 2023-03-24 DIAGNOSIS — E1159 Type 2 diabetes mellitus with other circulatory complications: Secondary | ICD-10-CM | POA: Diagnosis not present

## 2023-03-24 DIAGNOSIS — G629 Polyneuropathy, unspecified: Secondary | ICD-10-CM

## 2023-03-24 DIAGNOSIS — E782 Mixed hyperlipidemia: Secondary | ICD-10-CM | POA: Diagnosis not present

## 2023-03-24 DIAGNOSIS — I152 Hypertension secondary to endocrine disorders: Secondary | ICD-10-CM | POA: Diagnosis not present

## 2023-03-24 DIAGNOSIS — Z79899 Other long term (current) drug therapy: Secondary | ICD-10-CM | POA: Diagnosis not present

## 2023-03-24 DIAGNOSIS — M79671 Pain in right foot: Secondary | ICD-10-CM | POA: Diagnosis not present

## 2023-03-24 DIAGNOSIS — E1169 Type 2 diabetes mellitus with other specified complication: Secondary | ICD-10-CM

## 2023-03-24 LAB — BAYER DCA HB A1C WAIVED: HB A1C (BAYER DCA - WAIVED): 6.9 % — ABNORMAL HIGH (ref 4.8–5.6)

## 2023-03-24 MED ORDER — OXYCODONE HCL 10 MG PO TABS: 10.0000 mg | ORAL_TABLET | Freq: Four times a day (QID) | ORAL | 0 refills | Status: DC | PRN

## 2023-03-24 MED ORDER — OXYCODONE HCL 10 MG PO TABS
10.0000 mg | ORAL_TABLET | Freq: Four times a day (QID) | ORAL | 0 refills | Status: DC | PRN
Start: 2023-05-23 — End: 2023-06-24

## 2023-03-24 MED ORDER — DICLOFENAC SODIUM 75 MG PO TBEC
75.0000 mg | DELAYED_RELEASE_TABLET | Freq: Two times a day (BID) | ORAL | 3 refills | Status: DC
Start: 2023-03-24 — End: 2023-06-24

## 2023-03-24 NOTE — Progress Notes (Signed)
 BP 128/78   Pulse 74   Ht 6\' 1"  (1.854 m)   Wt 261 lb (118.4 kg)   SpO2 94%   BMI 34.43 kg/m    Subjective:   Patient ID: Timothy Nelson, male    DOB: [birth year], 58 y.o.   MRN: [id number]  HPI: Timothy Nelson is a 58 y.o. male presenting on 03/24/2023 for Medical Management of Chronic Issues and Diabetes   HPI Type 2 diabetes mellitus Patient comes in today for recheck of his diabetes. Patient has been currently taking glipizide and Jardiance and Trulicity. Patient is currently on an ACE inhibitor/ARB. Patient has not seen an ophthalmologist this year. Patient denies any new issues with their feet. The symptom started onset as an adult hypertension and hyperlipidemia and neuropathy ARE RELATED TO DM   Hyperlipidemia Patient is coming in for recheck of his hyperlipidemia. The patient is currently taking fish oils and simvastatin. They deny any issues with myalgias or history of liver damage from it. They deny any focal numbness or weakness or chest pain.   Hypertension Patient is currently on lisinopril, and their blood pressure today is 128/70. Patient denies any lightheadedness or dizziness. Patient denies headaches, blurred vision, chest pains, shortness of breath, or weakness. Denies any side effects from medication and is content with current medication.   Pain assessment: Cause of pain-ankle deformity from trauma Pain location-bilateral ankles Pain on scale of 1-10- 8 Frequency-daily What increases pain-being on his feet more What makes pain Better-rest and medication Effects on ADL -minimal Any change in general medical condition-none  Current opioids rx-oxycodone 10 4 times daily as needed # meds rx-120 Effectiveness of current meds-helps, starting did not help as much as it used to Adverse reactions from pain meds-some constipation Morphine equivalent-60  Pill count performed-No Last drug screen -03/05/2022 ( high risk q74m, moderate risk q8m, low risk yearly ) Urine  drug screen today- Yes Was the NCCSR reviewed-yes  If yes were their any concerning findings? -Then Pain contract signed on: 03/05/2022  Relevant past medical, surgical, family and social history reviewed and updated as indicated. Interim medical history since our last visit reviewed. Allergies and medications reviewed and updated.  Review of Systems  Constitutional:  Negative for chills and fever.  Eyes:  Negative for visual disturbance.  Respiratory:  Negative for shortness of breath and wheezing.   Cardiovascular:  Negative for chest pain and leg swelling.  Musculoskeletal:  Positive for arthralgias and gait problem. Negative for back pain.  Skin:  Negative for rash.  Neurological:  Negative for dizziness and light-headedness.  All other systems reviewed and are negative.   Per HPI unless specifically indicated above   Allergies as of 03/24/2023       Reactions   Fenofibrate Rash   Permament rash.        Medication List        Accurate as of March 24, 2023 10:20 AM. If you have any questions, ask your nurse or doctor.          Accu-Chek Guide w/Device Kit Test BS TID Dx E11.9   Accu-Chek Softclix Lancets lancets Test BS TID Dx E11.9   diclofenac 75 MG EC tablet Commonly known as: VOLTAREN Take 1 tablet (75 mg total) by mouth 2 (two) times daily. Started by: Elige Radon Olanrewaju Osborn   Fish Oil 1200 MG Cpdr Take 1,200 mg by mouth daily.   fluticasone 50 MCG/ACT nasal spray Commonly known as: FLONASE USE 1 SPRAY IN EACH NOSTRIL  TWICE DAILY AS NEEDED FOR ALLERGIES OR RHINITIS (SUBSTITUTED FOR FLONASE)   glipiZIDE 5 MG 24 hr tablet Commonly known as: GLUCOTROL XL Take 1 tablet (5 mg total) by mouth daily with breakfast.   glucose blood test strip Test BS TID Dx E11.9   IBU 600 MG tablet Generic drug: ibuprofen TAKE 1 TABLET EVERY 8 HOURS AS NEEDED FOR MILD PAIN OR MODERATE PAIN   Jardiance 25 MG Tabs tablet Generic drug: empagliflozin TAKE 1 TABLET EVERY  DAY BEFORE BREAKFAST   ketoconazole 2 % cream Commonly known as: NIZORAL Apply 1 application topically daily.   lisinopril 20 MG tablet Commonly known as: ZESTRIL Take 1 tablet (20 mg total) by mouth daily.   metoCLOPramide 5 MG tablet Commonly known as: REGLAN TAKE 1 TABLET THREE TIMES DAILY BEFORE MEALS   omeprazole 40 MG capsule Commonly known as: PRILOSEC TAKE 1 CAPSULE EVERY DAY   Oxycodone HCl 10 MG Tabs Take 1 tablet (10 mg total) by mouth 4 (four) times daily as needed. What changed: Another medication with the same name was changed. Make sure you understand how and when to take each. Changed by: Elige Radon Shreyas Piatkowski   Oxycodone HCl 10 MG Tabs Take 1 tablet (10 mg total) by mouth 4 (four) times daily as needed. Start taking on: April 23, 2023 What changed: These instructions start on April 23, 2023. If you are unsure what to do until then, ask your doctor or other care provider. Changed by: Elige Radon Darcy Barbara   Oxycodone HCl 10 MG Tabs Take 1 tablet (10 mg total) by mouth 4 (four) times daily as needed. Start taking on: May 23, 2023 What changed: These instructions start on May 23, 2023. If you are unsure what to do until then, ask your doctor or other care provider. Changed by: Elige Radon Otisha Spickler   simvastatin 40 MG tablet Commonly known as: ZOCOR Take 1 tablet (40 mg total) by mouth daily at 6 PM.   triamcinolone cream 0.1 % Commonly known as: KENALOG Apply 1 application topically 2 (two) times daily.   Trulicity 4.5 MG/0.5ML Soaj Generic drug: Dulaglutide INJECT 4.5MG  (1 PEN) UNDER THE SKIN EVERY WEEK AS DIRECTED         Objective:   BP 128/78   Pulse 74   Ht 6\' 1"  (1.854 m)   Wt 261 lb (118.4 kg)   SpO2 94%   BMI 34.43 kg/m   Wt Readings from Last 3 Encounters:  03/24/23 261 lb (118.4 kg)  12/24/22 261 lb (118.4 kg)  10/28/22 259 lb 3.2 oz (117.6 kg)    Physical Exam Vitals and nursing note reviewed.  Constitutional:      General: He is  not in acute distress.    Appearance: He is well-developed. He is not diaphoretic.  Eyes:     General: No scleral icterus.    Conjunctiva/sclera: Conjunctivae normal.  Neck:     Thyroid: No thyromegaly.  Cardiovascular:     Rate and Rhythm: Normal rate and regular rhythm.     Heart sounds: Normal heart sounds. No murmur heard. Pulmonary:     Effort: Pulmonary effort is normal. No respiratory distress.     Breath sounds: Normal breath sounds. No wheezing.  Musculoskeletal:        General: No swelling or tenderness.     Cervical back: Neck supple.  Lymphadenopathy:     Cervical: No cervical adenopathy.  Skin:    General: Skin is warm and dry.  Findings: No rash.  Neurological:     Mental Status: He is alert and oriented to person, place, and time.     Coordination: Coordination normal.  Psychiatric:        Behavior: Behavior normal.       Assessment & Plan:   Problem List Items Addressed This Visit       Cardiovascular and Mediastinum   Hypertension associated with diabetes (HCC)     Endocrine   Diabetes mellitus, type 2 (HCC) - Primary   Relevant Orders   Bayer DCA Hb A1c Waived   Hepatic Function Panel     Nervous and Auditory   Neuropathy     Other   Chronic foot pain   Relevant Medications   Oxycodone HCl 10 MG TABS (Start on 04/23/2023)   Oxycodone HCl 10 MG TABS (Start on 05/23/2023)   Oxycodone HCl 10 MG TABS   diclofenac (VOLTAREN) 75 MG EC tablet   Mixed hyperlipidemia   Other Visit Diagnoses       Controlled substance agreement signed       Relevant Orders   ToxASSURE Select 13 (MW), Urine     Add Voltaren to the oxycodone and see if that is enough to help with pain. A1c 6.9 and blood pressure looks good today.  Recheck his liver function today.  Follow up plan: Return in about 3 months (around 06/24/2023), or if symptoms worsen or fail to improve, for Diabetes and pain management.  Counseling provided for all of the vaccine  components Orders Placed This Encounter  Procedures   Bayer DCA Hb A1c Waived   Hepatic Function Panel   ToxASSURE Select 13 (MW), Urine    Arville Care, MD [facility] 03/24/2023, 10:20 AM

## 2023-03-25 ENCOUNTER — Encounter: Payer: Self-pay | Admitting: Family Medicine

## 2023-03-25 LAB — HEPATIC FUNCTION PANEL
ALT: 47 IU/L — ABNORMAL HIGH (ref 0–44)
AST: 33 IU/L (ref 0–40)
Albumin: 4.6 g/dL (ref 3.8–4.9)
Alkaline Phosphatase: 97 IU/L (ref 44–121)
Bilirubin Total: 0.4 mg/dL (ref 0.0–1.2)
Bilirubin, Direct: 0.15 mg/dL (ref 0.00–0.40)
Total Protein: 7 g/dL (ref 6.0–8.5)

## 2023-03-28 LAB — TOXASSURE SELECT 13 (MW), URINE

## 2023-04-09 ENCOUNTER — Encounter: Payer: Self-pay | Admitting: Family Medicine

## 2023-04-09 ENCOUNTER — Other Ambulatory Visit: Payer: Self-pay | Admitting: Family Medicine

## 2023-04-09 DIAGNOSIS — E1165 Type 2 diabetes mellitus with hyperglycemia: Secondary | ICD-10-CM

## 2023-04-09 DIAGNOSIS — G629 Polyneuropathy, unspecified: Secondary | ICD-10-CM

## 2023-04-09 DIAGNOSIS — K219 Gastro-esophageal reflux disease without esophagitis: Secondary | ICD-10-CM

## 2023-04-11 ENCOUNTER — Other Ambulatory Visit: Payer: Self-pay

## 2023-04-11 MED ORDER — METOCLOPRAMIDE HCL 5 MG PO TABS: 5.0000 mg | ORAL_TABLET | Freq: Three times a day (TID) | ORAL | 3 refills | Status: AC

## 2023-04-11 MED ORDER — TRULICITY 4.5 MG/0.5ML ~~LOC~~ SOAJ: 4.5000 mg | SUBCUTANEOUS | 3 refills | Status: AC

## 2023-04-11 MED ORDER — METOCLOPRAMIDE HCL 5 MG PO TABS: 5.0000 mg | ORAL_TABLET | Freq: Three times a day (TID) | ORAL | 0 refills | Status: DC

## 2023-04-11 MED ORDER — GLIPIZIDE ER 5 MG PO TB24: 5.0000 mg | ORAL_TABLET | Freq: Every day | ORAL | 3 refills | Status: AC

## 2023-04-11 MED ORDER — OMEPRAZOLE 40 MG PO CPDR
40.0000 mg | DELAYED_RELEASE_CAPSULE | Freq: Every day | ORAL | 3 refills | Status: AC
Start: 2023-04-11 — End: ?

## 2023-04-11 MED ORDER — OMEPRAZOLE 40 MG PO CPDR: 40.0000 mg | DELAYED_RELEASE_CAPSULE | Freq: Every day | ORAL | 1 refills | Status: DC

## 2023-04-11 NOTE — Telephone Encounter (Signed)
 90d supply sent of Reglan and #90 with 1RF sent of Omeprazole to mail order pharmacy.

## 2023-04-13 ENCOUNTER — Ambulatory Visit: Admitting: Orthopedic Surgery

## 2023-04-13 ENCOUNTER — Other Ambulatory Visit (INDEPENDENT_AMBULATORY_CARE_PROVIDER_SITE_OTHER): Payer: Self-pay

## 2023-04-13 ENCOUNTER — Ambulatory Visit: Admitting: Orthopaedic Surgery

## 2023-04-13 ENCOUNTER — Encounter: Payer: Self-pay | Admitting: Orthopedic Surgery

## 2023-04-13 VITALS — BP 105/67 | HR 87 | Ht 74.0 in | Wt 254.0 lb

## 2023-04-13 DIAGNOSIS — S52514A Nondisplaced fracture of right radial styloid process, initial encounter for closed fracture: Secondary | ICD-10-CM

## 2023-04-13 DIAGNOSIS — M25531 Pain in right wrist: Secondary | ICD-10-CM

## 2023-04-14 NOTE — Progress Notes (Signed)
 New Patient Visit  Assessment: Timothy Nelson is a 58 y.o. male with the following: 1. Closed nondisplaced fracture of styloid process of right radius, initial encounter  Plan: Jalil Lorusso has pain directly over the right radial styloid after falling off a ladder 2 weeks ago.  XR of the wrist appear to demonstrate a small, non displaced fracture at the radial styloid.  Will plan to immobilize in a brace.  Follow up in 2 weeks for repeat XR.   Follow-up: Return in about 2 weeks (around 04/27/2023).  Subjective:  Chief Complaint  Patient presents with   Wrist Injury    Right wrist fell off ladder 2 and a half weeks ago     History of Present Illness: Timothy Nelson is a 58 y.o. male who presents for evaluation of right wrist pain.  He is right hand dominant.   He fell off a ladder 2-3 weeks ago.  He has had pain in the wrist and it is not improving.  No pain elsewhere from the fall.  He is on chronic narcotic therapy.  His strength has been affected.   Review of Systems: No fevers or chills No numbness or tingling No chest pain No shortness of breath No bowel or bladder dysfunction No GI distress No headaches   Medical History:  Past Medical History:  Diagnosis Date   Arthritis    Barrett's esophagus    Chronic pain    Diabetes (HCC)    Essential hypertension    Gastroparesis    GERD (gastroesophageal reflux disease)    Hyperlipidemia    Obesity    Type 2 diabetes mellitus (HCC)     Past Surgical History:  Procedure Laterality Date   ESOPHAGOGASTRODUODENOSCOPY (EGD) WITH PROPOFOL N/A 04/04/2015   Procedure: ESOPHAGOGASTRODUODENOSCOPY (EGD) WITH PROPOFOL;  Surgeon: Malissa Hippo, MD;  Location: [facility];  Service: Endoscopy;  Laterality: N/A;  8:30   FRACTURE SURGERY  03/2005   Left arm, left thumb, bilateral legs   HIP SURGERY Right    WRIST FRACTURE SURGERY Left     Family History  Problem Relation Age of Onset   Diabetes Mother    Hypertension Mother     Hyperlipidemia Mother    Arthritis Mother    Esophageal cancer Mother        patient unsure   Diabetes Father    Heart disease Father        Age 84   Kidney disease Father    Heart attack Father        Quadruple bypass at age 49   Hypertension Sister    Diabetes Daughter    Colon cancer Neg Hx    Stomach cancer Neg Hx    Pancreatic cancer Neg Hx    Liver disease Neg Hx    Social History   Tobacco Use   Smoking status: Former    Current packs/day: 0.00    Average packs/day: 1 pack/day for 15.0 years (15.0 ttl pk-yrs)    Types: Cigarettes, Cigars    Start date: 01/04/2003    Quit date: 01/03/2018    Years since quitting: 5.2   Smokeless tobacco: Never   Tobacco comments:    Smoked intermittently for 20 years; 1-2 cigars weekly as of 02/07/2018  Vaping Use   Vaping status: Never Used  Substance Use Topics   Alcohol use: Yes    Alcohol/week: 3.0 standard drinks of alcohol    Types: 3 Cans of beer per week    Comment:  glass wine a week.    Drug use: No    Allergies  Allergen Reactions   Fenofibrate Rash    Permament rash.     Current Meds  Medication Sig   Accu-Chek Softclix Lancets lancets Test BS TID Dx E11.9   Blood Glucose Monitoring Suppl (ACCU-CHEK GUIDE) w/Device KIT Test BS TID Dx E11.9   diclofenac (VOLTAREN) 75 MG EC tablet Take 1 tablet (75 mg total) by mouth 2 (two) times daily.   Dulaglutide (TRULICITY) 4.5 MG/0.5ML SOAJ Inject 4.5 mg into the skin once a week.   fluticasone (FLONASE) 50 MCG/ACT nasal spray USE 1 SPRAY IN EACH NOSTRIL TWICE DAILY AS NEEDED FOR ALLERGIES OR RHINITIS (SUBSTITUTED FOR FLONASE)   glipiZIDE (GLUCOTROL XL) 5 MG 24 hr tablet Take 1 tablet (5 mg total) by mouth daily with breakfast.   glucose blood test strip Test BS TID Dx E11.9   JARDIANCE 25 MG TABS tablet TAKE 1 TABLET EVERY DAY BEFORE BREAKFAST   ketoconazole (NIZORAL) 2 % cream Apply 1 application topically daily.   lisinopril (ZESTRIL) 20 MG tablet Take 1 tablet (20  mg total) by mouth daily.   metoCLOPramide (REGLAN) 5 MG tablet Take 1 tablet (5 mg total) by mouth 3 (three) times daily before meals.   Omega-3 Fatty Acids (FISH OIL) 1200 MG CPDR Take 1,200 mg by mouth daily.   omeprazole (PRILOSEC) 40 MG capsule Take 1 capsule (40 mg total) by mouth daily.   [START ON 04/23/2023] Oxycodone HCl 10 MG TABS Take 1 tablet (10 mg total) by mouth 4 (four) times daily as needed.   [START ON 05/23/2023] Oxycodone HCl 10 MG TABS Take 1 tablet (10 mg total) by mouth 4 (four) times daily as needed.   Oxycodone HCl 10 MG TABS Take 1 tablet (10 mg total) by mouth 4 (four) times daily as needed.   simvastatin (ZOCOR) 40 MG tablet Take 1 tablet (40 mg total) by mouth daily at 6 PM.   triamcinolone cream (KENALOG) 0.1 % Apply 1 application topically 2 (two) times daily.    Objective: BP 105/67   Pulse 87   Ht 6\' 2"  (1.88 m)   Wt 254 lb (115.2 kg)   BMI 32.61 kg/m   Physical Exam:  General: Alert and oriented. and No acute distress. Gait: Normal gait.  Evaluation of the right wrist demonstrates some mild swelling.  Mild bruising is appreciated.  He has exquisite tenderness to palpation over the radial styloid within the snuffbox.  He has difficulty with grip strength.  Fingers warm well-perfused.  Sensation intact throughout the right hand.  No pain in the mid aspect of the wrist.  IMAGING: I personally ordered and reviewed the following images  X-rays of the right wrist were obtained in clinic today.  No acute injuries noted.  Well-maintained wrist joint space.  There appears to be a nondisplaced fracture of the radial styloid.  Possible SL interval widening.  No bony lesions.  Impression: Right wrist x-ray with nondisplaced fracture of the radial styloid.  New Medications:  No orders of the defined types were placed in this encounter.     Oliver Barre, MD  04/14/2023 9:19 PM

## 2023-04-27 ENCOUNTER — Other Ambulatory Visit (INDEPENDENT_AMBULATORY_CARE_PROVIDER_SITE_OTHER): Payer: Self-pay

## 2023-04-27 ENCOUNTER — Encounter: Payer: Self-pay | Admitting: Orthopedic Surgery

## 2023-04-27 ENCOUNTER — Other Ambulatory Visit: Payer: Self-pay | Admitting: Acute Care

## 2023-04-27 ENCOUNTER — Ambulatory Visit: Admitting: Orthopedic Surgery

## 2023-04-27 DIAGNOSIS — S52514A Nondisplaced fracture of right radial styloid process, initial encounter for closed fracture: Secondary | ICD-10-CM

## 2023-04-27 DIAGNOSIS — Z122 Encounter for screening for malignant neoplasm of respiratory organs: Secondary | ICD-10-CM

## 2023-04-27 DIAGNOSIS — F17210 Nicotine dependence, cigarettes, uncomplicated: Secondary | ICD-10-CM

## 2023-04-27 DIAGNOSIS — Z87891 Personal history of nicotine dependence: Secondary | ICD-10-CM

## 2023-04-27 NOTE — Progress Notes (Signed)
 Return patient Visit  Assessment: Timothy Nelson is a 58 y.o. male with the following: 1. Closed nondisplaced fracture of styloid process of right radius, subsequent encounter  Plan: Timothy Nelson continues to have pain directly over the radial styloid after a fall from a ladder approximately 1 month ago.  Repeat radiographs were obtained in clinic today.  There is no displacement.  There are areas of potential impaction of the radial styloid.  No definitive intra-articular involvement.  He is very tender in this area.  It does affect his motion with specific activities.  He is most likely impacted the radial styloid, and essentially has a nondisplaced fracture.  This was discussed.  I think that this will continue to improve with time, but can take a couple months.  He states understanding.  If he is not getting better in another month, we will likely refer him to see Dr. Marce Sensing.   Follow-up: Return in about 4 weeks (around 05/25/2023).  Subjective:  Chief Complaint  Patient presents with   Fracture    R wrist DOI 03/29/23    History of Present Illness: Timothy Nelson is a 58 y.o. male who returns for evaluation of right wrist pain.  He fell off a ladder approximately 1 month ago.  Since I saw him, he has been wearing a thumb spica brace.  He states his wrist feels about the same.  He has difficulty with hygiene, which causes a lot of pain.  He takes ibuprofen , with limited improvement in symptoms.  No numbness or tingling.  Review of Systems: No fevers or chills No numbness or tingling No chest pain No shortness of breath No bowel or bladder dysfunction No GI distress No headaches    Objective: There were no vitals taken for this visit.  Physical Exam:  General: Alert and oriented. and No acute distress. Gait: Normal gait.  Evaluation of the right wrist demonstrates some mild swelling.  He has exquisite tenderness to palpation over the radial styloid.  No pain over the middle  aspect of the wrist.  No pain over the first dorsal compartment.  He is able to make a fist.  4/5 grip strength.  IMAGING: I personally ordered and reviewed the following images  X-ray of the right wrist were obtained in clinic today.  These compared to prior x-rays.  No loss of wrist joint space.  The SL interval is within normal limits.  Careful evaluation of the radial styloid demonstrates some areas of decreased mineralization, which could represent some impaction or nondisplaced fracture.  No intra-articular step-off.  No evidence of callus formation.  Impression: Right wrist x-rays with nondisplaced fracture of the radial styloid  New Medications:  No orders of the defined types were placed in this encounter.     Tonita Frater, MD  04/27/2023 10:33 AM

## 2023-05-18 ENCOUNTER — Ambulatory Visit (HOSPITAL_COMMUNITY)

## 2023-05-23 ENCOUNTER — Other Ambulatory Visit: Payer: Self-pay | Admitting: Family Medicine

## 2023-05-25 ENCOUNTER — Ambulatory Visit: Admitting: Orthopedic Surgery

## 2023-05-26 ENCOUNTER — Encounter: Payer: Self-pay | Admitting: Family Medicine

## 2023-06-14 DIAGNOSIS — M2041 Other hammer toe(s) (acquired), right foot: Secondary | ICD-10-CM | POA: Diagnosis not present

## 2023-06-14 DIAGNOSIS — M2042 Other hammer toe(s) (acquired), left foot: Secondary | ICD-10-CM | POA: Diagnosis not present

## 2023-06-14 DIAGNOSIS — L602 Onychogryphosis: Secondary | ICD-10-CM | POA: Diagnosis not present

## 2023-06-14 DIAGNOSIS — E1142 Type 2 diabetes mellitus with diabetic polyneuropathy: Secondary | ICD-10-CM | POA: Diagnosis not present

## 2023-06-24 ENCOUNTER — Encounter: Payer: Self-pay | Admitting: Family Medicine

## 2023-06-24 ENCOUNTER — Ambulatory Visit: Admitting: Family Medicine

## 2023-06-24 VITALS — BP 110/77 | HR 85 | Ht 74.0 in | Wt 259.0 lb

## 2023-06-24 DIAGNOSIS — E782 Mixed hyperlipidemia: Secondary | ICD-10-CM | POA: Diagnosis not present

## 2023-06-24 DIAGNOSIS — I152 Hypertension secondary to endocrine disorders: Secondary | ICD-10-CM | POA: Diagnosis not present

## 2023-06-24 DIAGNOSIS — E1169 Type 2 diabetes mellitus with other specified complication: Secondary | ICD-10-CM

## 2023-06-24 DIAGNOSIS — M79671 Pain in right foot: Secondary | ICD-10-CM | POA: Diagnosis not present

## 2023-06-24 DIAGNOSIS — E1159 Type 2 diabetes mellitus with other circulatory complications: Secondary | ICD-10-CM

## 2023-06-24 DIAGNOSIS — G8929 Other chronic pain: Secondary | ICD-10-CM

## 2023-06-24 DIAGNOSIS — Z7984 Long term (current) use of oral hypoglycemic drugs: Secondary | ICD-10-CM | POA: Diagnosis not present

## 2023-06-24 DIAGNOSIS — G629 Polyneuropathy, unspecified: Secondary | ICD-10-CM

## 2023-06-24 LAB — BAYER DCA HB A1C WAIVED: HB A1C (BAYER DCA - WAIVED): 7.3 % — ABNORMAL HIGH (ref 4.8–5.6)

## 2023-06-24 LAB — LIPID PANEL

## 2023-06-24 MED ORDER — OXYCODONE HCL 10 MG PO TABS: 10.0000 mg | ORAL_TABLET | Freq: Four times a day (QID) | ORAL | 0 refills | Status: DC | PRN

## 2023-06-24 MED ORDER — CELECOXIB 200 MG PO CAPS
200.0000 mg | ORAL_CAPSULE | Freq: Two times a day (BID) | ORAL | 3 refills | Status: DC
Start: 2023-06-24 — End: 2023-10-19

## 2023-06-24 NOTE — Progress Notes (Signed)
 BP 110/77   Pulse 85   Ht 6' 2 (1.88 m)   Wt 259 lb (117.5 kg)   SpO2 95%   BMI 33.25 kg/m    Subjective:   Patient ID: Timothy Nelson, male    DOB: May 12, 1965, 58 y.o.   MRN: [id number]  HPI: Timothy Nelson is a 58 y.o. male presenting on 06/24/2023 for Medical Management of Chronic Issues and Diabetes   HPI Type 2 diabetes mellitus Patient comes in today for recheck of his diabetes. Patient has been currently taking Trulicity  and glipizide  and Jardiance . Patient is currently on an ACE inhibitor/ARB. Patient has not seen an ophthalmologist this year. Patient denies any new issues with their feet, still has foot deformity from previous trauma and neuropathy in 1 foot from that.. The symptom started onset as an adult neuropathy and hypertension and hyperlipidemia ARE RELATED TO DM   Hyperlipidemia Patient is coming in for recheck of his hyperlipidemia. The patient is currently taking fish oils and simvastatin . They deny any issues with myalgias or history of liver damage from it. They deny any focal numbness or weakness or chest pain.   Hypertension Patient is currently on lisinopril , and their blood pressure today is 110/77. Patient denies any lightheadedness or dizziness. Patient denies headaches, blurred vision, chest pains, shortness of breath, or weakness. Denies any side effects from medication and is content with current medication.   Pain assessment: Cause of pain-bilateral ankle pain Pain location-from foot deformity Pain on scale of 1-10- 6 Frequency-Daily What increases pain-doing things on his feet, he does do firefighter things What makes pain Better-medication rest Effects on ADL -minimal Any change in general medical condition-none  Current opioids rx-oxycodone  10 mg 4 times daily as needed # meds rx-120/month, starting to lose efficacy because of duration Effectiveness of current meds-Works but losing some efficacy Adverse reactions from pain meds-some  constipation Morphine  equivalent- 60  Pill count performed-No Last drug screen -04/18/2023 ( high risk q25m, moderate risk q4m, low risk yearly ) Urine drug screen today- No Was the [facility] reviewed-yes  If yes were their any concerning findings? -None  Pain contract signed on: 04/18/2023  Relevant past medical, surgical, family and social history reviewed and updated as indicated. Interim medical history since our last visit reviewed. Allergies and medications reviewed and updated.  Review of Systems  Constitutional:  Negative for chills and fever.  Eyes:  Negative for visual disturbance.  Respiratory:  Negative for shortness of breath and wheezing.   Cardiovascular:  Negative for chest pain and leg swelling.  Musculoskeletal:  Positive for arthralgias and myalgias. Negative for back pain and gait problem.  Skin:  Negative for rash.  Neurological:  Negative for dizziness and light-headedness.  All other systems reviewed and are negative.   Per HPI unless specifically indicated above   Allergies as of 06/24/2023       Reactions   Fenofibrate Rash   Permament rash.        Medication List        Accurate as of June 24, 2023 12:03 PM. If you have any questions, ask your nurse or doctor.          STOP taking these medications    diclofenac  75 MG EC tablet Commonly known as: VOLTAREN  Stopped by: Lucio Sabin Maricsa Sammons       TAKE these medications    Accu-Chek Guide w/Device Kit Test BS TID Dx E11.9   Accu-Chek Softclix Lancets lancets Test BS TID Dx E11.9  celecoxib 200 MG capsule Commonly known as: CeleBREX Take 1 capsule (200 mg total) by mouth 2 (two) times daily. Started by: Lucio Sabin Berdia Lachman   Fish Oil  1200 MG Cpdr Take 1,200 mg by mouth daily.   fluticasone  50 MCG/ACT nasal spray Commonly known as: FLONASE  USE 1 SPRAY IN EACH NOSTRIL TWICE DAILY AS NEEDED FOR ALLERGIES OR RHINITIS (SUBSTITUTED FOR FLONASE )   glipiZIDE  5 MG 24 hr tablet Commonly  known as: GLUCOTROL  XL Take 1 tablet (5 mg total) by mouth daily with breakfast.   glucose blood test strip Test BS TID Dx E11.9   Jardiance  25 MG Tabs tablet Generic drug: empagliflozin  TAKE 1 TABLET EVERY DAY BEFORE BREAKFAST   ketoconazole  2 % cream Commonly known as: NIZORAL  Apply 1 application topically daily.   lisinopril  20 MG tablet Commonly known as: ZESTRIL  Take 1 tablet (20 mg total) by mouth daily.   metoCLOPramide  5 MG tablet Commonly known as: REGLAN  Take 1 tablet (5 mg total) by mouth 3 (three) times daily before meals.   omeprazole  40 MG capsule Commonly known as: PRILOSEC Take 1 capsule (40 mg total) by mouth daily.   Oxycodone  HCl 10 MG Tabs Take 1 tablet (10 mg total) by mouth 4 (four) times daily as needed. What changed: Another medication with the same name was changed. Make sure you understand how and when to take each. Changed by: Lucio Sabin Bertin Inabinet   Oxycodone  HCl 10 MG Tabs Take 1 tablet (10 mg total) by mouth 4 (four) times daily as needed. Start taking on: July 23, 2023 What changed: These instructions start on July 23, 2023. If you are unsure what to do until then, ask your doctor or other care provider. Changed by: Lucio Sabin Namya Voges   Oxycodone  HCl 10 MG Tabs Take 1 tablet (10 mg total) by mouth 4 (four) times daily as needed. Start taking on: August 23, 2023 What changed: These instructions start on August 23, 2023. If you are unsure what to do until then, ask your doctor or other care provider. Changed by: Lucio Sabin Jobina Maita   simvastatin  40 MG tablet Commonly known as: ZOCOR  Take 1 tablet (40 mg total) by mouth daily at 6 PM.   triamcinolone  cream 0.1 % Commonly known as: KENALOG  Apply 1 application topically 2 (two) times daily.   Trulicity  4.5 MG/0.5ML Soaj Generic drug: Dulaglutide  Inject 4.5 mg into the skin once a week.         Objective:   BP 110/77   Pulse 85   Ht 6' 2 (1.88 m)   Wt 259 lb (117.5 kg)   SpO2 95%    BMI 33.25 kg/m   Wt Readings from Last 3 Encounters:  06/24/23 259 lb (117.5 kg)  04/13/23 254 lb (115.2 kg)  03/24/23 261 lb (118.4 kg)    Physical Exam Vitals and nursing note reviewed.  Constitutional:      General: He is not in acute distress.    Appearance: He is well-developed. He is not diaphoretic.   Eyes:     General: No scleral icterus.    Conjunctiva/sclera: Conjunctivae normal.   Neck:     Thyroid : No thyromegaly.   Cardiovascular:     Rate and Rhythm: Normal rate and regular rhythm.     Heart sounds: Normal heart sounds. No murmur heard. Pulmonary:     Effort: Pulmonary effort is normal. No respiratory distress.     Breath sounds: Normal breath sounds. No wheezing.   Musculoskeletal:  General: Normal range of motion.     Cervical back: Neck supple.  Lymphadenopathy:     Cervical: No cervical adenopathy.   Neurological:     Mental Status: He is alert and oriented to person, place, and time.     Coordination: Coordination normal.   Psychiatric:        Behavior: Behavior normal.       Assessment & Plan:   Problem List Items Addressed This Visit       Cardiovascular and Mediastinum   Hypertension associated with diabetes (HCC)   Relevant Orders   Bayer DCA Hb A1c Waived   CBC with Differential/Platelet   CMP14+EGFR   Lipid panel     Endocrine   Diabetes mellitus, type 2 (HCC) - Primary     Nervous and Auditory   Neuropathy     Other   Chronic foot pain   Relevant Medications   Oxycodone  HCl 10 MG TABS (Start on 07/23/2023)   Oxycodone  HCl 10 MG TABS (Start on 08/23/2023)   Oxycodone  HCl 10 MG TABS   celecoxib (CELEBREX) 200 MG capsule   Mixed hyperlipidemia   Relevant Orders   Bayer DCA Hb A1c Waived   CBC with Differential/Platelet   CMP14+EGFR   Lipid panel    A1c is 7.3, blood pressure looks good.  Seems to be doing well, still having more issues with his ankles, will try and add Celebrex instead of the  diclofenac . Follow up plan: Return in about 3 months (around 09/24/2023), or if symptoms worsen or fail to improve, for Pain management and hypertension and hyperlipidemia and diabetes.  Counseling provided for all of the vaccine components Orders Placed This Encounter  Procedures   Bayer DCA Hb A1c Waived   CBC with Differential/Platelet   CMP14+EGFR   Lipid panel    Jolyne Needs, MD [facility] 06/24/2023, 12:03 PM

## 2023-06-25 LAB — CBC WITH DIFFERENTIAL/PLATELET
Basophils Absolute: 0 10*3/uL (ref 0.0–0.2)
Basos: 0 %
EOS (ABSOLUTE): 0.1 10*3/uL (ref 0.0–0.4)
Eos: 2 %
Hematocrit: 49.9 % (ref 37.5–51.0)
Hemoglobin: 16.5 g/dL (ref 13.0–17.7)
Immature Grans (Abs): 0 10*3/uL (ref 0.0–0.1)
Immature Granulocytes: 0 %
Lymphocytes Absolute: 1.7 10*3/uL (ref 0.7–3.1)
Lymphs: 23 %
MCH: 31.6 pg (ref 26.6–33.0)
MCHC: 33.1 g/dL (ref 31.5–35.7)
MCV: 96 fL (ref 79–97)
Monocytes Absolute: 0.6 10*3/uL (ref 0.1–0.9)
Monocytes: 9 %
Neutrophils Absolute: 5 10*3/uL (ref 1.4–7.0)
Neutrophils: 66 %
Platelets: 219 10*3/uL (ref 150–450)
RBC: 5.22 x10E6/uL (ref 4.14–5.80)
RDW: 13.1 % (ref 11.6–15.4)
WBC: 7.5 10*3/uL (ref 3.4–10.8)

## 2023-06-25 LAB — LIPID PANEL
Chol/HDL Ratio: 3.5 ratio (ref 0.0–5.0)
Cholesterol, Total: 145 mg/dL (ref 100–199)
HDL: 42 mg/dL (ref >39)
LDL Chol Calc (NIH): 79 mg/dL (ref 0–99)
Triglycerides: 137 mg/dL (ref 0–149)
VLDL Cholesterol Cal: 24 mg/dL (ref 5–40)

## 2023-06-25 LAB — CMP14+EGFR
ALT: 43 IU/L (ref 0–44)
AST: 32 IU/L (ref 0–40)
Albumin: 4.6 g/dL (ref 3.8–4.9)
Alkaline Phosphatase: 101 IU/L (ref 44–121)
BUN/Creatinine Ratio: 15 (ref 9–20)
BUN: 20 mg/dL (ref 6–24)
Bilirubin Total: 0.5 mg/dL (ref 0.0–1.2)
CO2: 18 mmol/L — ABNORMAL LOW (ref 20–29)
Calcium: 10 mg/dL (ref 8.7–10.2)
Chloride: 102 mmol/L (ref 96–106)
Creatinine, Ser: 1.34 mg/dL — ABNORMAL HIGH (ref 0.76–1.27)
Globulin, Total: 2.5 g/dL (ref 1.5–4.5)
Glucose: 168 mg/dL — ABNORMAL HIGH (ref 70–99)
Potassium: 5.2 mmol/L (ref 3.5–5.2)
Sodium: 141 mmol/L (ref 134–144)
Total Protein: 7.1 g/dL (ref 6.0–8.5)
eGFR: 62 mL/min/{1.73_m2} (ref >59)

## 2023-06-29 ENCOUNTER — Ambulatory Visit: Admitting: Orthopedic Surgery

## 2023-07-04 ENCOUNTER — Ambulatory Visit: Admitting: Orthopedic Surgery

## 2023-07-07 ENCOUNTER — Ambulatory Visit: Payer: Self-pay | Admitting: Family Medicine

## 2023-07-12 ENCOUNTER — Ambulatory Visit: Admitting: Orthopedic Surgery

## 2023-07-27 ENCOUNTER — Ambulatory Visit: Admitting: Orthopedic Surgery

## 2023-07-27 ENCOUNTER — Other Ambulatory Visit (INDEPENDENT_AMBULATORY_CARE_PROVIDER_SITE_OTHER): Payer: Self-pay

## 2023-07-27 DIAGNOSIS — M25531 Pain in right wrist: Secondary | ICD-10-CM | POA: Diagnosis not present

## 2023-07-27 DIAGNOSIS — M65321 Trigger finger, right index finger: Secondary | ICD-10-CM

## 2023-07-27 MED ORDER — BETAMETHASONE SOD PHOS & ACET 6 (3-3) MG/ML IJ SUSP
6.0000 mg | INTRAMUSCULAR | Status: AC | PRN
  Administered 2023-07-27: 6 mg via INTRA_ARTICULAR

## 2023-07-27 MED ORDER — LIDOCAINE HCL 1 % IJ SOLN
1.0000 mL | INTRAMUSCULAR | Status: AC | PRN
  Administered 2023-07-27: 1 mL

## 2023-07-27 NOTE — Progress Notes (Signed)
 Timothy Nelson - 58 y.o. male MRN [id number]  Date of birth: August 02, 1965  Office Visit Note: Visit Date: 07/27/2023 PCP: Dettinger, Fonda LABOR, MD Referred by: Arlinda Buster, MD  Subjective: No chief complaint on file.  HPI: Timothy Nelson is a pleasant 58 y.o. male previously known to me for prior Xiaflex injection for the right ring finger last year, who presents today as a referral from one of my partners for ongoing right wrist pain as well as right index finger trigger digit.  He has a history of prior right long finger injection for trigger digit in 2022.  Has not undergone any prior treatments for the right index finger.  Pertinent ROS were reviewed with the patient and found to be negative unless otherwise specified above in HPI.    Assessment & Plan: Visit Diagnoses:  1. Trigger finger, right index finger   2. Pain in right wrist     Plan: Extensive discussion was had with the patient today regarding his right index trigger digit.  We discussed the etiology and pathophysiology of stenosing tenosynovitis.  We discussed conservative versus surgical treatment modalities.  From a conservative standpoint, we discussed activity modification, splinting, therapy and injections.  From a surgical standpoint, we discussed the possibility for trigger digit release as well as all risk and benefits associated.  Given that he has not trialed conservative treatments, patient is appropriate candidate for cortisone injection to the right index finger A1 pulley for symptom relief.  Risks and benefit of the cortisone injection were discussed in detail, patient agreed to proceed.  Injection was provided today without issue, patient will return in approximate 6 weeks time for a recheck.  As far as the right wrist radial styloid fracture, he is demonstrating appropriate clinical and radiographic healing at this time.  No surgical intervention is necessary for this.  We also discussed his ongoing  Dupuytren's disease within the right palm, there is some recurrent contracture of the MP of the ring finger which is currently mild, approximately 10 degrees.  I did explain that with increasing cord formation or increasing contracture, he could be a reasonable candidate for repeat Xiaflex injection in the future.  Expressed understanding.   Follow-up: No follow-ups on file.   Meds & Orders: No orders of the defined types were placed in this encounter.   Orders Placed This Encounter  Procedures   Hand/UE Inj: R index A1   XR Wrist Complete Right     Procedures: Hand/UE Inj: R index A1 for trigger finger on 07/27/2023 9:41 AM Indications: pain Details: 25 G needle, volar approach Medications: 1 mL lidocaine  1 %; 6 mg betamethasone  acetate-betamethasone  sodium phosphate 6 (3-3) MG/ML Outcome: tolerated well, no immediate complications Consent was given by the patient. Patient was prepped and draped in the usual sterile fashion.          Clinical History: No specialty comments available.  He reports that he quit smoking about 5 years ago. His smoking use included cigarettes and cigars. He started smoking about 20 years ago. He has a 15 pack-year smoking history. He has never used smokeless tobacco.  Recent Labs    12/24/22 0757 03/24/23 0953 06/24/23 1102  HGBA1C 7.9* 6.9* 7.3*    Objective:   Vital Signs: There were no vitals taken for this visit.  Physical Exam  Gen: Well-appearing, in no acute distress; non-toxic CV: Regular Rate. Well-perfused. Warm.  Resp: Breathing unlabored on room air; no wheezing. Psych: Fluid speech in conversation; appropriate  affect; normal thought process  Ortho Exam Right hand: - Mild tenderness over the radial styloid region - Wrist range of motion is well-preserved flexion/extension 65/55, pronation/supination 70/70 - Notable tenderness over the A1 pulley of the index finger, associated clicking with deep flexion of the index finger - MP  contracture of the ring finger approximately 10 degrees, minimally palpable cord in line with the ring finger  Imaging: XR Wrist Complete Right Result Date: 07/27/2023 X-rays of the right wrist demonstrate interval healing of the previously known radial styloid fracture.  There is some calcification seen in this region as well.  Radiocarpal joint mains well located in all planes.   Past Medical/Family/Surgical/Social History: Medications & Allergies reviewed per EMR, new medications updated. Patient Active Problem List   Diagnosis Date Noted   Coronary artery disease involving native coronary artery of native heart without angina pectoris 06/22/2022   Dyslipidemia 06/22/2022   Dupuytren's disease of palm with nodules without contracture 07/14/2021   Mixed hyperlipidemia 10/03/2018   Personal history of diabetic foot ulcer 09/30/2017   Neuropathy 08/05/2017   Diabetes mellitus, type 2 (HCC) 08/05/2017   Class 2 drug-induced obesity with serious comorbidity and body mass index (BMI) of 35.0 to 35.9 in adult 01/19/2017   Snoring 01/19/2017   GERD (gastroesophageal reflux disease) 01/09/2015   Hypertension associated with diabetes (HCC) 09/11/2014   Chronic foot pain 09/11/2014   Past Medical History:  Diagnosis Date   Arthritis    Barrett's esophagus    Chronic pain    Diabetes (HCC)    Essential hypertension    Gastroparesis    GERD (gastroesophageal reflux disease)    Hyperlipidemia    Obesity    Type 2 diabetes mellitus (HCC)    Family History  Problem Relation Age of Onset   Diabetes Mother    Hypertension Mother    Hyperlipidemia Mother    Arthritis Mother    Esophageal cancer Mother        patient unsure   Diabetes Father    Heart disease Father        Age 59   Kidney disease Father    Heart attack Father        Quadruple bypass at age 63   Hypertension Sister    Diabetes Daughter    Colon cancer Neg Hx    Stomach cancer Neg Hx    Pancreatic cancer Neg Hx     Liver disease Neg Hx    Past Surgical History:  Procedure Laterality Date   ESOPHAGOGASTRODUODENOSCOPY (EGD) WITH PROPOFOL  N/A 04/04/2015   Procedure: ESOPHAGOGASTRODUODENOSCOPY (EGD) WITH PROPOFOL ;  Surgeon: Claudis RAYMOND Rivet, MD;  Location: [facility];  Service: Endoscopy;  Laterality: N/A;  8:30   FRACTURE SURGERY  03/2005   Left arm, left thumb, bilateral legs   HIP SURGERY Right    WRIST FRACTURE SURGERY Left    Social History   Occupational History   Occupation: Disability  Tobacco Use   Smoking status: Former    Current packs/day: 0.00    Average packs/day: 1 pack/day for 15.0 years (15.0 ttl pk-yrs)    Types: Cigarettes, Cigars    Start date: 01/04/2003    Quit date: 01/03/2018    Years since quitting: 5.5   Smokeless tobacco: Never   Tobacco comments:    Smoked intermittently for 20 years; 1-2 cigars weekly as of 02/07/2018  Vaping Use   Vaping status: Never Used  Substance and Sexual Activity   Alcohol use: Yes  Alcohol/week: 3.0 standard drinks of alcohol    Types: 3 Cans of beer per week    Comment: glass wine a week.    Drug use: No   Sexual activity: Yes    Birth control/protection: None    Benicio Manna Estela) Drewey Begue, M.D. [facility], Hand Surgery

## 2023-08-17 ENCOUNTER — Encounter

## 2023-08-17 NOTE — Progress Notes (Deleted)
 Erroneous encounter

## 2023-08-24 DIAGNOSIS — K22719 Barrett's esophagus with dysplasia, unspecified: Secondary | ICD-10-CM | POA: Diagnosis not present

## 2023-08-24 DIAGNOSIS — Z860100 Personal history of colon polyps, unspecified: Secondary | ICD-10-CM | POA: Diagnosis not present

## 2023-08-26 ENCOUNTER — Encounter: Payer: Self-pay | Admitting: Radiology

## 2023-09-07 ENCOUNTER — Ambulatory Visit: Admitting: Orthopedic Surgery

## 2023-09-12 ENCOUNTER — Ambulatory Visit: Admitting: Orthopedic Surgery

## 2023-09-23 ENCOUNTER — Encounter: Payer: Self-pay | Admitting: Family Medicine

## 2023-09-23 ENCOUNTER — Ambulatory Visit: Admitting: Family Medicine

## 2023-09-23 VITALS — BP 114/79 | HR 72 | Ht 74.0 in | Wt 258.0 lb

## 2023-09-23 DIAGNOSIS — E1159 Type 2 diabetes mellitus with other circulatory complications: Secondary | ICD-10-CM | POA: Diagnosis not present

## 2023-09-23 DIAGNOSIS — G629 Polyneuropathy, unspecified: Secondary | ICD-10-CM

## 2023-09-23 DIAGNOSIS — E782 Mixed hyperlipidemia: Secondary | ICD-10-CM | POA: Diagnosis not present

## 2023-09-23 DIAGNOSIS — Z7984 Long term (current) use of oral hypoglycemic drugs: Secondary | ICD-10-CM | POA: Diagnosis not present

## 2023-09-23 DIAGNOSIS — G8929 Other chronic pain: Secondary | ICD-10-CM

## 2023-09-23 DIAGNOSIS — I152 Hypertension secondary to endocrine disorders: Secondary | ICD-10-CM

## 2023-09-23 DIAGNOSIS — M79671 Pain in right foot: Secondary | ICD-10-CM

## 2023-09-23 DIAGNOSIS — E1169 Type 2 diabetes mellitus with other specified complication: Secondary | ICD-10-CM

## 2023-09-23 LAB — BAYER DCA HB A1C WAIVED: HB A1C (BAYER DCA - WAIVED): 7.2 % — ABNORMAL HIGH (ref 4.8–5.6)

## 2023-09-23 MED ORDER — LISINOPRIL 20 MG PO TABS: 20.0000 mg | ORAL_TABLET | Freq: Every day | ORAL | 3 refills | Status: AC

## 2023-09-23 MED ORDER — SIMVASTATIN 40 MG PO TABS: 40.0000 mg | ORAL_TABLET | Freq: Every day | ORAL | 3 refills | Status: AC

## 2023-09-23 MED ORDER — OXYCODONE HCL 10 MG PO TABS: 10.0000 mg | ORAL_TABLET | Freq: Four times a day (QID) | ORAL | 0 refills | Status: AC | PRN

## 2023-09-23 MED ORDER — OXYCODONE HCL 10 MG PO TABS
10.0000 mg | ORAL_TABLET | Freq: Four times a day (QID) | ORAL | 0 refills | Status: AC | PRN
Start: 2023-10-22 — End: ?

## 2023-09-23 MED ORDER — IBUPROFEN 600 MG PO TABS: 600.0000 mg | ORAL_TABLET | Freq: Three times a day (TID) | ORAL | 3 refills | Status: AC | PRN

## 2023-09-23 NOTE — Progress Notes (Signed)
 BP 114/79   Pulse 72   Ht 6' 2 (1.88 m)   Wt 258 lb (117 kg)   SpO2 97%   BMI 33.13 kg/m    Subjective:   Patient ID: Timothy Nelson, male    DOB: 1965/05/27, 58 y.o.   MRN: [id number]  HPI: Timothy Nelson is a 58 y.o. male presenting on 09/23/2023 for Medical Management of Chronic Issues, Diabetes, Hyperlipidemia, and Hypertension   Discussed the use of AI scribe software for clinical note transcription with the patient, who gave verbal consent to proceed.  History of Present Illness   Timothy Nelson is a 58 year old male with diabetes, hypertension, hyperlipidemia, and chronic pain who presents for a recheck of his chronic conditions.  He experiences chronic pain in his bilateral ankles due to deformities from previous trauma, which affects his ability to walk without pain medication. He also has neuropathy in his right foot and lower extremity. He is currently taking oxycodone  10 mg four times a day as needed, with consistent use verified by the [place]  [employer]. A pain agreement and urine drug screen were completed on April 18, 2023.  For diabetes management, he is on Trulicity , Jardiance , and glipizide . He acknowledges suboptimal blood sugar control, attributing it to dietary habits and lack of physical activity.  He continues to take lisinopril  for hypertension. For hyperlipidemia, he takes fish oil  and expresses concerns about statins, although he is not currently experiencing muscle aches. He reports occasional muscle spasms but attributes them to other causes.  He sees a podiatrist every four months for foot care, including toenail trimming, as he cannot see the bottom of his feet. He reports no current issues with sores or calluses.          Relevant past medical, surgical, family and social history reviewed and updated as indicated. Interim medical history since our last visit reviewed. Allergies and medications reviewed and updated.  Review of Systems   Constitutional:  Negative for chills and fever.  Eyes:  Negative for visual disturbance.  Respiratory:  Negative for shortness of breath and wheezing.   Cardiovascular:  Negative for chest pain and leg swelling.  Musculoskeletal:  Positive for arthralgias and gait problem. Negative for back pain.  Skin:  Negative for rash.  Neurological:  Negative for dizziness, weakness and light-headedness.  All other systems reviewed and are negative.   Per HPI unless specifically indicated above   Allergies as of 09/23/2023       Reactions   Fenofibrate Rash   Permament rash.        Medication List        Accurate as of September 23, 2023  8:43 AM. If you have any questions, ask your nurse or doctor.          Accu-Chek Guide w/Device Kit Test BS TID Dx E11.9   Accu-Chek Softclix Lancets lancets Test BS TID Dx E11.9   celecoxib  200 MG capsule Commonly known as: CeleBREX  Take 1 capsule (200 mg total) by mouth 2 (two) times daily.   Fish Oil  1200 MG Cpdr Take 1,200 mg by mouth daily.   fluticasone  50 MCG/ACT nasal spray Commonly known as: FLONASE  USE 1 SPRAY IN EACH NOSTRIL TWICE DAILY AS NEEDED FOR ALLERGIES OR RHINITIS (SUBSTITUTED FOR FLONASE )   glipiZIDE  5 MG 24 hr tablet Commonly known as: GLUCOTROL  XL Take 1 tablet (5 mg total) by mouth daily with breakfast.   glucose blood test strip Test BS TID Dx E11.9  ibuprofen  600 MG tablet Commonly known as: ADVIL  Take 1 tablet (600 mg total) by mouth every 8 (eight) hours as needed. Started by: Fonda LABOR Dearia Wilmouth   Jardiance  25 MG Tabs tablet Generic drug: empagliflozin  TAKE 1 TABLET EVERY DAY BEFORE BREAKFAST   ketoconazole  2 % cream Commonly known as: NIZORAL  Apply 1 application topically daily.   lisinopril  20 MG tablet Commonly known as: ZESTRIL  Take 1 tablet (20 mg total) by mouth daily.   metoCLOPramide  5 MG tablet Commonly known as: REGLAN  Take 1 tablet (5 mg total) by mouth 3 (three) times daily  before meals.   omeprazole  40 MG capsule Commonly known as: PRILOSEC Take 1 capsule (40 mg total) by mouth daily.   Oxycodone  HCl 10 MG Tabs Take 1 tablet (10 mg total) by mouth 4 (four) times daily as needed. What changed: Another medication with the same name was changed. Make sure you understand how and when to take each. Changed by: [facility]OR Izola Teague   Oxycodone  HCl 10 MG Tabs Take 1 tablet (10 mg total) by mouth 4 (four) times daily as needed. Start taking on: October 22, 2023 What changed: These instructions start on October 22, 2023. If you are unsure what to do until then, ask your doctor or other care provider. Changed by: [facility]OR Campbell Kray   Oxycodone  HCl 10 MG Tabs Take 1 tablet (10 mg total) by mouth 4 (four) times daily as needed. Start taking on: November 22, 2023 What changed: These instructions start on November 22, 2023. If you are unsure what to do until then, ask your doctor or other care provider. Changed by: [facility]OR Roddrick Sharron   simvastatin  40 MG tablet Commonly known as: ZOCOR  Take 1 tablet (40 mg total) by mouth daily at 6 PM.   triamcinolone  cream 0.1 % Commonly known as: KENALOG  Apply 1 application topically 2 (two) times daily.   Trulicity  4.5 MG/0.5ML Soaj Generic drug: Dulaglutide  Inject 4.5 mg into the skin once a week.         Objective:   BP 114/79   Pulse 72   Ht 6' 2 (1.88 m)   Wt 258 lb (117 kg)   SpO2 97%   BMI 33.13 kg/m   Wt Readings from Last 3 Encounters:  09/23/23 258 lb (117 kg)  06/24/23 259 lb (117.5 kg)  04/13/23 254 lb (115.2 kg)    Physical Exam Physical Exam   VITALS: BP- 114/79 CHEST: Lungs clear to auscultation bilaterally. CARDIOVASCULAR: Heart regular rate and rhythm, no murmurs.     Extremities: Trace peripheral edema in bilateral lower extremities. HEENT: No thyroid  masses or abnormalities    Assessment & Plan:   Problem List Items Addressed This Visit       Cardiovascular and Mediastinum    Hypertension associated with diabetes (HCC)   Relevant Medications   lisinopril  (ZESTRIL ) 20 MG tablet   simvastatin  (ZOCOR ) 40 MG tablet     Endocrine   Diabetes mellitus, type 2 (HCC) - Primary   Relevant Medications   lisinopril  (ZESTRIL ) 20 MG tablet   simvastatin  (ZOCOR ) 40 MG tablet   Other Relevant Orders   Bayer DCA Hb A1c Waived   BMP8+EGFR   Microalbumin/Creatinine Ratio, Urine     Nervous and Auditory   Neuropathy     Other   Chronic foot pain   Relevant Medications   Oxycodone  HCl 10 MG TABS (Start on 10/22/2023)   Oxycodone  HCl 10 MG TABS (Start on 11/22/2023)   Oxycodone  HCl 10  MG TABS   ibuprofen  (ADVIL ) 600 MG tablet   Mixed hyperlipidemia   Relevant Medications   lisinopril  (ZESTRIL ) 20 MG tablet   simvastatin  (ZOCOR ) 40 MG tablet       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 Diet and activity levels are contributing factors. Current medications are well-tolerated. - Encouraged joining a gym to increase physical activity. - Continue Trulicity , Jardiance , and glipizide .  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 Oxycodone  effective, celecoxib  inadequate. - Discontinue celecoxib . - Prescribe ibuprofen  with food. - Continue oxycodone  10 mg QID PRN. - Follow up with podiatrist on October 3rd or 4th.  Mixed hyperlipidemia Managed with fish oil . Statins recommended for cardiovascular event reduction despite concerns about side effects. - Continue current management with fish oil . - Discuss potential statin use if lipid levels are not controlled.  Essential hypertension Blood pressure well-controlled on lisinopril . - Continue lisinopril .  Muscle spasms Discussed dietary sources of magnesium and potassium. Encouraged hydration and dietary  adjustments. - Consider magnesium or potassium supplementation. - Encourage dietary intake of potassium (bananas) and magnesium (mustard).          Follow up plan: Return in about 3 months (around 12/23/2023), or if symptoms worsen or fail to improve, for Diabetes and pain management.  Counseling provided for all of the vaccine components Orders Placed This Encounter  Procedures   Bayer DCA Hb A1c Waived   BMP8+EGFR   Microalbumin/Creatinine Ratio, Urine    Fonda Levins, MD [facility] 09/23/2023, 8:43 AM

## 2023-09-24 LAB — BMP8+EGFR
BUN/Creatinine Ratio: 14 (ref 9–20)
BUN: 19 mg/dL (ref 6–24)
CO2: 19 mmol/L — ABNORMAL LOW (ref 20–29)
Calcium: 9.5 mg/dL (ref 8.7–10.2)
Chloride: 104 mmol/L (ref 96–106)
Creatinine, Ser: 1.33 mg/dL — ABNORMAL HIGH (ref 0.76–1.27)
Glucose: 267 mg/dL — ABNORMAL HIGH (ref 70–99)
Potassium: 4.7 mmol/L (ref 3.5–5.2)
Sodium: 139 mmol/L (ref 134–144)
eGFR: 62 mL/min/1.73 (ref >59)

## 2023-09-28 ENCOUNTER — Ambulatory Visit: Payer: Self-pay | Admitting: Family Medicine

## 2023-10-03 ENCOUNTER — Ambulatory Visit

## 2023-10-05 DIAGNOSIS — H5213 Myopia, bilateral: Secondary | ICD-10-CM | POA: Diagnosis not present

## 2023-10-05 DIAGNOSIS — D3131 Benign neoplasm of right choroid: Secondary | ICD-10-CM | POA: Diagnosis not present

## 2023-10-05 DIAGNOSIS — H2513 Age-related nuclear cataract, bilateral: Secondary | ICD-10-CM | POA: Diagnosis not present

## 2023-10-05 DIAGNOSIS — E119 Type 2 diabetes mellitus without complications: Secondary | ICD-10-CM | POA: Diagnosis not present

## 2023-10-05 DIAGNOSIS — H52223 Regular astigmatism, bilateral: Secondary | ICD-10-CM | POA: Diagnosis not present

## 2023-10-05 DIAGNOSIS — Q141 Congenital malformation of retina: Secondary | ICD-10-CM | POA: Diagnosis not present

## 2023-10-05 DIAGNOSIS — H524 Presbyopia: Secondary | ICD-10-CM | POA: Diagnosis not present

## 2023-10-05 LAB — OPHTHALMOLOGY REPORT-SCANNED

## 2023-10-14 DIAGNOSIS — L602 Onychogryphosis: Secondary | ICD-10-CM | POA: Diagnosis not present

## 2023-10-14 DIAGNOSIS — L859 Epidermal thickening, unspecified: Secondary | ICD-10-CM | POA: Diagnosis not present

## 2023-10-14 DIAGNOSIS — E1142 Type 2 diabetes mellitus with diabetic polyneuropathy: Secondary | ICD-10-CM | POA: Diagnosis not present

## 2023-10-19 ENCOUNTER — Encounter (INDEPENDENT_AMBULATORY_CARE_PROVIDER_SITE_OTHER): Payer: Self-pay | Admitting: Gastroenterology

## 2023-10-19 ENCOUNTER — Ambulatory Visit (INDEPENDENT_AMBULATORY_CARE_PROVIDER_SITE_OTHER): Admitting: Family Medicine

## 2023-10-19 ENCOUNTER — Ambulatory Visit (INDEPENDENT_AMBULATORY_CARE_PROVIDER_SITE_OTHER)

## 2023-10-19 ENCOUNTER — Encounter: Payer: Self-pay | Admitting: Family Medicine

## 2023-10-19 ENCOUNTER — Ambulatory Visit: Payer: Self-pay | Admitting: Family Medicine

## 2023-10-19 VITALS — BP 103/62 | HR 83 | Temp 97.7°F | Ht 74.0 in | Wt 258.4 lb

## 2023-10-19 DIAGNOSIS — S91301A Unspecified open wound, right foot, initial encounter: Secondary | ICD-10-CM

## 2023-10-19 DIAGNOSIS — E1169 Type 2 diabetes mellitus with other specified complication: Secondary | ICD-10-CM

## 2023-10-19 DIAGNOSIS — M19071 Primary osteoarthritis, right ankle and foot: Secondary | ICD-10-CM | POA: Diagnosis not present

## 2023-10-19 MED ORDER — DOXYCYCLINE HYCLATE 100 MG PO TABS: 100.0000 mg | ORAL_TABLET | Freq: Two times a day (BID) | ORAL | 0 refills | Status: AC

## 2023-10-19 NOTE — Progress Notes (Signed)
 Acute Office Visit  Subjective:     Patient ID: Timothy Nelson, male    DOB: [birth year], 58 y.o.   MRN: [id number]  Chief Complaint  Patient presents with   Wound Check    HPI  History of Present Illness   Timothy Nelson is a 58 year old male with diabetes who presents with a foot injury and numbness.  Foot injury and local symptoms - Unnoticed foot injury discovered after finding blood in shoe approximately four days ago - Dull piece of glass embedded in shoe that has been poking into the bottom of his foot and cutting it - Soaking foot in antibacterial soap and initially used hydrogen peroxide for wound care - Redness observed by wife on lateral side and top of foot; foot often becomes red and swollen, especially after previous surgery - Tenderness present on the side of the foot - No fever reported - Not currently taking antibiotics for the injury  Peripheral neuropathy - Numbness in feet, contributing to unawareness of foot injury  Diabetes mellitus - Recent hemoglobin A1c levels ranging from 6.5 to 8.2, most recently 7.1 to 7.2     He is UTD on his tetanus.   ROS As per HPI.      Objective:    BP 103/62   Pulse 83   Temp 97.7 F (36.5 C) (Temporal)   Ht 6' 2 (1.88 m)   Wt 258 lb 6.4 oz (117.2 kg)   SpO2 96%   BMI 33.18 kg/m    Physical Exam Vitals and nursing note reviewed.  Constitutional:      General: He is not in acute distress.    Appearance: He is not ill-appearing, toxic-appearing or diaphoretic.  Pulmonary:     Effort: Pulmonary effort is normal. No respiratory distress.  Skin:    General: Skin is warm and dry.     Findings: Wound (puncture wound to right lateral planter foot with surrounding erythema that extends up lateral side to top of foot. Clear drainage present.) present.  Neurological:     Mental Status: He is alert and oriented to person, place, and time.  Psychiatric:        Mood and Affect: Mood normal.        Behavior: Behavior  normal.     No results found for any visits on 10/19/23.      Assessment & Plan:   Timothy Nelson was seen today for wound check.  Diagnoses and all orders for this visit:  Open wound of right foot, initial encounter -     doxycycline (VIBRA-TABS) 100 MG tablet; Take 1 tablet (100 mg total) by mouth 2 (two) times daily for 7 days. -     DG Foot 2 Views Right  Type 2 diabetes mellitus with other specified complication, without long-term current use of insulin (HCC) -     doxycycline (VIBRA-TABS) 100 MG tablet; Take 1 tablet (100 mg total) by mouth 2 (two) times daily for 7 days. -     DG Foot 2 Views Right      Right foot wound with infection - Prescribed doxycycline 100 mg BID for 7 days. - Ordered foot x-ray to rule out osteomyelitis. No signs of osteomyelitis, agree with radiology read.  - Advised taking doxycycline with food. - Follow up in 1 week for recheck, sooner for new or worsening symptoms - Planned to cleanse and dress wound following xray, however patient left prior to completion of this.   Type 2 diabetes  mellitus with diabetic neuropathy Diabetic neuropathy. Recent A1c 7.2.     Return in about 1 week (around 10/26/2023) for wound check.  Return to office for new or worsening symptoms, or if symptoms persist.   Annabella CHRISTELLA Search, FNP

## 2023-10-21 ENCOUNTER — Ambulatory Visit: Admitting: Family Medicine

## 2023-10-24 DIAGNOSIS — L918 Other hypertrophic disorders of the skin: Secondary | ICD-10-CM | POA: Diagnosis not present

## 2023-10-24 DIAGNOSIS — L308 Other specified dermatitis: Secondary | ICD-10-CM | POA: Diagnosis not present

## 2023-10-24 DIAGNOSIS — L02611 Cutaneous abscess of right foot: Secondary | ICD-10-CM | POA: Diagnosis not present

## 2023-10-24 DIAGNOSIS — L858 Other specified epidermal thickening: Secondary | ICD-10-CM | POA: Diagnosis not present

## 2023-10-24 DIAGNOSIS — M795 Residual foreign body in soft tissue: Secondary | ICD-10-CM | POA: Diagnosis not present

## 2023-10-24 DIAGNOSIS — L218 Other seborrheic dermatitis: Secondary | ICD-10-CM | POA: Diagnosis not present

## 2023-10-26 DIAGNOSIS — K2270 Barrett's esophagus without dysplasia: Secondary | ICD-10-CM | POA: Diagnosis not present

## 2023-10-26 DIAGNOSIS — K22719 Barrett's esophagus with dysplasia, unspecified: Secondary | ICD-10-CM | POA: Diagnosis not present

## 2023-10-26 DIAGNOSIS — K635 Polyp of colon: Secondary | ICD-10-CM | POA: Diagnosis not present

## 2023-10-26 DIAGNOSIS — Z860100 Personal history of colon polyps, unspecified: Secondary | ICD-10-CM | POA: Diagnosis not present

## 2023-10-26 DIAGNOSIS — Z1211 Encounter for screening for malignant neoplasm of colon: Secondary | ICD-10-CM | POA: Diagnosis not present

## 2023-10-27 ENCOUNTER — Ambulatory Visit: Admitting: Family Medicine

## 2023-10-31 DIAGNOSIS — L02611 Cutaneous abscess of right foot: Secondary | ICD-10-CM | POA: Diagnosis not present

## 2023-10-31 DIAGNOSIS — E1142 Type 2 diabetes mellitus with diabetic polyneuropathy: Secondary | ICD-10-CM | POA: Diagnosis not present

## 2023-10-31 DIAGNOSIS — S90859A Superficial foreign body, unspecified foot, initial encounter: Secondary | ICD-10-CM | POA: Diagnosis not present

## 2023-11-06 DIAGNOSIS — K635 Polyp of colon: Secondary | ICD-10-CM | POA: Diagnosis not present

## 2023-11-07 ENCOUNTER — Encounter: Payer: Self-pay | Admitting: Radiology

## 2023-11-09 ENCOUNTER — Encounter: Payer: Self-pay | Admitting: Family Medicine

## 2023-11-14 DIAGNOSIS — E1142 Type 2 diabetes mellitus with diabetic polyneuropathy: Secondary | ICD-10-CM | POA: Diagnosis not present

## 2023-11-14 DIAGNOSIS — L02611 Cutaneous abscess of right foot: Secondary | ICD-10-CM | POA: Diagnosis not present

## 2023-11-18 ENCOUNTER — Telehealth: Payer: Self-pay | Admitting: Family Medicine

## 2023-11-18 DIAGNOSIS — Z0279 Encounter for issue of other medical certificate: Secondary | ICD-10-CM

## 2023-11-18 NOTE — Telephone Encounter (Signed)
 Timothy Nelson dropped off handicap forms to be completed and signed.  Form Fee Paid? (Y/N)       yes     If NO, form is placed on front office manager desk to hold until payment received. If YES, then form will be placed in the RX/HH Nurse Coordinators box for completion.  Form will not be processed until payment is received

## 2023-11-25 NOTE — Telephone Encounter (Signed)
 Aware handicap form ready

## 2023-12-05 DIAGNOSIS — E1142 Type 2 diabetes mellitus with diabetic polyneuropathy: Secondary | ICD-10-CM | POA: Diagnosis not present

## 2023-12-05 DIAGNOSIS — L02611 Cutaneous abscess of right foot: Secondary | ICD-10-CM | POA: Diagnosis not present

## 2023-12-06 DIAGNOSIS — Z0000 Encounter for general adult medical examination without abnormal findings: Secondary | ICD-10-CM

## 2023-12-06 NOTE — Progress Notes (Unsigned)
his encounter was created in error - please disregard.

## 2023-12-23 ENCOUNTER — Encounter: Payer: Self-pay | Admitting: Family Medicine

## 2023-12-23 ENCOUNTER — Ambulatory Visit (INDEPENDENT_AMBULATORY_CARE_PROVIDER_SITE_OTHER): Payer: Self-pay | Admitting: Family Medicine

## 2023-12-23 VITALS — BP 123/76 | HR 76 | Ht 74.0 in | Wt 260.0 lb

## 2023-12-23 DIAGNOSIS — M79671 Pain in right foot: Secondary | ICD-10-CM

## 2023-12-23 DIAGNOSIS — Z7985 Long-term (current) use of injectable non-insulin antidiabetic drugs: Secondary | ICD-10-CM | POA: Diagnosis not present

## 2023-12-23 DIAGNOSIS — Z7984 Long term (current) use of oral hypoglycemic drugs: Secondary | ICD-10-CM | POA: Diagnosis not present

## 2023-12-23 DIAGNOSIS — I152 Hypertension secondary to endocrine disorders: Secondary | ICD-10-CM | POA: Diagnosis not present

## 2023-12-23 DIAGNOSIS — G8929 Other chronic pain: Secondary | ICD-10-CM | POA: Diagnosis not present

## 2023-12-23 DIAGNOSIS — E782 Mixed hyperlipidemia: Secondary | ICD-10-CM | POA: Diagnosis not present

## 2023-12-23 DIAGNOSIS — E1159 Type 2 diabetes mellitus with other circulatory complications: Secondary | ICD-10-CM | POA: Diagnosis not present

## 2023-12-23 DIAGNOSIS — E1169 Type 2 diabetes mellitus with other specified complication: Secondary | ICD-10-CM

## 2023-12-23 LAB — BAYER DCA HB A1C WAIVED: HB A1C (BAYER DCA - WAIVED): 8 % — ABNORMAL HIGH (ref 4.8–5.6)

## 2023-12-23 LAB — LIPID PANEL

## 2023-12-23 MED ORDER — OXYCODONE HCL 10 MG PO TABS: 10.0000 mg | ORAL_TABLET | Freq: Four times a day (QID) | ORAL | 0 refills | Status: AC | PRN

## 2023-12-23 NOTE — Progress Notes (Signed)
 "  BP 123/76   Pulse 76   Ht 6' 2 (1.88 m)   Wt 260 lb (117.9 kg)   SpO2 97%   BMI 33.38 kg/m    Subjective:   Patient ID: Timothy Nelson, male    DOB: [birth year], 58 y.o.   MRN: [id number]  HPI: Timothy Nelson is a 58 y.o. male presenting on 12/23/2023 for Medical Management of Chronic Issues and Diabetes   Discussed the use of AI scribe software for clinical note transcription with the patient, who gave verbal consent to proceed.  History of Present Illness   Timothy Nelson is a 58 year old male with diabetes who presents for a recheck of his blood sugar levels.  Glycemic control - Diabetes mellitus managed with Trulicity , glipizide , and Jardiance  - No home blood glucose monitoring - Uncertain about current glucose control - No side effects from diabetes medications - No genitourinary complications; specifically denies erectile dysfunction  Foot symptoms - Ongoing podiatric care with visits every 2-3 weeks - Persistent sensation at site of previous bone shaving, concerned about possible regrowth or callus formation  Hydration status - High fluid intake including water, black coffee, and occasional soda - Uses urine color as a personal indicator of hydration          Relevant past medical, surgical, family and social history reviewed and updated as indicated. Interim medical history since our last visit reviewed. Allergies and medications reviewed and updated.  Review of Systems  Constitutional:  Negative for chills and fever.  Respiratory:  Negative for shortness of breath and wheezing.   Cardiovascular:  Negative for chest pain and leg swelling.  Musculoskeletal:  Positive for arthralgias and gait problem. Negative for back pain and myalgias.  Skin:  Negative for rash.  All other systems reviewed and are negative.   Per HPI unless specifically indicated above   Allergies as of 12/23/2023       Reactions   Fenofibrate Rash   Permament rash.        Medication  List        Accurate as of December 23, 2023 11:13 AM. If you have any questions, ask your nurse or doctor.          Accu-Chek Guide w/Device Kit Test BS TID Dx E11.9   Accu-Chek Softclix Lancets lancets Test BS TID Dx E11.9   Fish Oil  1200 MG Cpdr Take 1,200 mg by mouth daily.   fluticasone  50 MCG/ACT nasal spray Commonly known as: FLONASE  USE 1 SPRAY IN EACH NOSTRIL TWICE DAILY AS NEEDED FOR ALLERGIES OR RHINITIS (SUBSTITUTED FOR FLONASE )   glipiZIDE  5 MG 24 hr tablet Commonly known as: GLUCOTROL  XL Take 1 tablet (5 mg total) by mouth daily with breakfast.   glucose blood test strip Test BS TID Dx E11.9   ibuprofen  600 MG tablet Commonly known as: ADVIL  Take 1 tablet (600 mg total) by mouth every 8 (eight) hours as needed.   Jardiance  25 MG Tabs tablet Generic drug: empagliflozin  TAKE 1 TABLET EVERY DAY BEFORE BREAKFAST   ketoconazole  2 % cream Commonly known as: NIZORAL  Apply 1 application topically daily.   lisinopril  20 MG tablet Commonly known as: ZESTRIL  Take 1 tablet (20 mg total) by mouth daily.   metoCLOPramide  5 MG tablet Commonly known as: REGLAN  Take 1 tablet (5 mg total) by mouth 3 (three) times daily before meals.   omeprazole  40 MG capsule Commonly known as: PRILOSEC Take 1 capsule (40 mg total) by mouth daily.  Oxycodone  HCl 10 MG Tabs Take 1 tablet (10 mg total) by mouth 4 (four) times daily as needed. What changed: Another medication with the same name was changed. Make sure you understand how and when to take each. Changed by: Fonda Levins, MD   Oxycodone  HCl 10 MG Tabs Take 1 tablet (10 mg total) by mouth 4 (four) times daily as needed. Start taking on: January 22, 2024 What changed: These instructions start on January 22, 2024. If you are unsure what to do until then, ask your doctor or other care provider. Changed by: Fonda Levins, MD   Oxycodone  HCl 10 MG Tabs Take 1 tablet (10 mg total) by mouth 4 (four) times daily  as needed. Start taking on: February 21, 2024 What changed: These instructions start on February 21, 2024. If you are unsure what to do until then, ask your doctor or other care provider. Changed by: Fonda Levins, MD   simvastatin  40 MG tablet Commonly known as: ZOCOR  Take 1 tablet (40 mg total) by mouth daily at 6 PM.   triamcinolone  cream 0.1 % Commonly known as: KENALOG  Apply 1 application topically 2 (two) times daily.   Trulicity  4.5 MG/0.5ML Soaj Generic drug: Dulaglutide  Inject 4.5 mg into the skin once a week.         Objective:   BP 123/76   Pulse 76   Ht 6' 2 (1.88 m)   Wt 260 lb (117.9 kg)   SpO2 97%   BMI 33.38 kg/m   Wt Readings from Last 3 Encounters:  12/23/23 260 lb (117.9 kg)  10/19/23 258 lb 6.4 oz (117.2 kg)  09/23/23 258 lb (117 kg)    Physical Exam Vitals and nursing note reviewed.  Constitutional:      Appearance: Normal appearance.  Neurological:     Mental Status: He is alert.    Physical Exam   CHEST: Lungs clear to auscultation. CARDIOVASCULAR: Heart regular rate and rhythm.       Results for orders placed or performed in visit on 10/05/23  OPHTHALMOLOGY REPORT-SCANNED   Collection Time: 10/05/23 11:55 AM  Result Value Ref Range   HM Diabetic Eye Exam No Retinopathy No Retinopathy   A Comment      Assessment & Plan:   Problem List Items Addressed This Visit       Cardiovascular and Mediastinum   Hypertension associated with diabetes (HCC)     Endocrine   Diabetes mellitus, type 2 (HCC) - Primary   Relevant Orders   Bayer DCA Hb A1c Waived   CBC with Differential/Platelet   CMP14+EGFR   Lipid panel   PSA, total and free   TSH   Microalbumin / creatinine urine ratio     Other   Chronic foot pain   Relevant Medications   Oxycodone  HCl 10 MG TABS   Oxycodone  HCl 10 MG TABS (Start on 01/22/2024)   Oxycodone  HCl 10 MG TABS (Start on 02/21/2024)   Mixed hyperlipidemia        Type 2 diabetes mellitus with  circulatory complications Type 2 diabetes with A1c of 8.0, indicating suboptimal control. On Trulicity , glipizide , and Jardiance  without side effects. Does not regularly monitor blood glucose. - Continue Trulicity , glipizide , and Jardiance . - Significant refocus on diet.  Chronic pain in right foot Chronic right foot pain. Reports bone prominence and potential callus. Under podiatrist care, requires x-ray for evaluation. - Coordinated with radiology to provide x-ray images to Dr. Debby Hoist. - Refilled oxycodone  prescription.  Urine drug screen and controlled substance agreement were done on 04/18/2023 and PDMP was reviewed and looks to be consistent with regular refills and not refilling too early.     Follow up plan: Return in about 3 months (around 03/22/2024), or if symptoms worsen or fail to improve, for Diabetes and pain management.  Counseling provided for all of the vaccine components Orders Placed This Encounter  Procedures   Bayer DCA Hb A1c Waived   CBC with Differential/Platelet   CMP14+EGFR   Lipid panel   PSA, total and free   TSH   Microalbumin / creatinine urine ratio    Fonda Levins, MD [facility] 12/23/2023, 11:13 AM     "

## 2023-12-24 LAB — MICROALBUMIN / CREATININE URINE RATIO
Creatinine, Urine: 47.2 mg/dL
Microalb/Creat Ratio: 19 mg/g{creat} (ref 0–29)
Microalbumin, Urine: 8.8 ug/mL

## 2023-12-27 LAB — CMP14+EGFR
ALT: 72 IU/L — AB (ref 0–44)
AST: 59 IU/L — AB (ref 0–40)
Albumin: 5 g/dL — AB (ref 3.8–4.9)
Alkaline Phosphatase: 148 IU/L — AB (ref 47–123)
BUN/Creatinine Ratio: 14 (ref 9–20)
BUN: 18 mg/dL (ref 6–24)
Bilirubin Total: 0.4 mg/dL (ref 0.0–1.2)
CO2: 19 mmol/L — AB (ref 20–29)
Calcium: 10.2 mg/dL (ref 8.7–10.2)
Chloride: 99 mmol/L (ref 96–106)
Creatinine, Ser: 1.31 mg/dL — AB (ref 0.76–1.27)
Globulin, Total: 2.6 g/dL (ref 1.5–4.5)
Potassium: 5.2 mmol/L (ref 3.5–5.2)
Sodium: 139 mmol/L (ref 134–144)
Total Protein: 7.6 g/dL (ref 6.0–8.5)
eGFR: 63 mL/min/1.73

## 2023-12-27 LAB — LIPID PANEL
Cholesterol, Total: 184 mg/dL (ref 100–199)
HDL: 48 mg/dL
LDL CALC COMMENT:: 3.8 ratio (ref 0.0–5.0)
LDL Chol Calc (NIH): 72 mg/dL (ref 0–99)
Triglycerides: 408 mg/dL — AB (ref 0–149)
VLDL Cholesterol Cal: 64 mg/dL — AB (ref 5–40)

## 2023-12-27 LAB — CBC WITH DIFFERENTIAL/PLATELET
Basophils Absolute: 0 x10E3/uL (ref 0.0–0.2)
Basos: 1 %
EOS (ABSOLUTE): 0.2 x10E3/uL (ref 0.0–0.4)
Eos: 2 %
Hematocrit: 56.2 % — ABNORMAL HIGH (ref 37.5–51.0)
Hemoglobin: 18.9 g/dL — ABNORMAL HIGH (ref 13.0–17.7)
Immature Grans (Abs): 0 x10E3/uL (ref 0.0–0.1)
Immature Granulocytes: 0 %
Lymphocytes Absolute: 1.9 x10E3/uL (ref 0.7–3.1)
Lymphs: 22 %
MCH: 32.4 pg (ref 26.6–33.0)
MCHC: 33.6 g/dL (ref 31.5–35.7)
MCV: 96 fL (ref 79–97)
Monocytes Absolute: 0.6 x10E3/uL (ref 0.1–0.9)
Monocytes: 7 %
Neutrophils Absolute: 5.9 x10E3/uL (ref 1.4–7.0)
Neutrophils: 68 %
Platelets: 218 x10E3/uL (ref 150–450)
RBC: 5.84 x10E6/uL — ABNORMAL HIGH (ref 4.14–5.80)
RDW: 13.1 % (ref 11.6–15.4)
WBC: 8.5 x10E3/uL (ref 3.4–10.8)

## 2023-12-27 LAB — TSH: TSH: 3.24 u[IU]/mL (ref 0.450–4.500)

## 2023-12-27 LAB — PSA, TOTAL AND FREE
PSA, Free Pct: 70 %
PSA, Free: 0.28 ng/mL
Prostate Specific Ag, Serum: 0.4 ng/mL (ref 0.0–4.0)

## 2023-12-31 ENCOUNTER — Other Ambulatory Visit: Payer: Self-pay | Admitting: Nurse Practitioner

## 2023-12-31 DIAGNOSIS — E1169 Type 2 diabetes mellitus with other specified complication: Secondary | ICD-10-CM

## 2024-01-02 ENCOUNTER — Ambulatory Visit: Payer: Self-pay | Admitting: Family Medicine

## 2024-01-02 ENCOUNTER — Other Ambulatory Visit: Payer: Self-pay

## 2024-01-02 ENCOUNTER — Encounter: Payer: Self-pay | Admitting: Family Medicine

## 2024-01-02 DIAGNOSIS — R718 Other abnormality of red blood cells: Secondary | ICD-10-CM

## 2024-01-02 DIAGNOSIS — R748 Abnormal levels of other serum enzymes: Secondary | ICD-10-CM

## 2024-01-16 ENCOUNTER — Other Ambulatory Visit

## 2024-01-24 ENCOUNTER — Other Ambulatory Visit: Payer: Self-pay | Admitting: Nurse Practitioner

## 2024-01-24 DIAGNOSIS — E1169 Type 2 diabetes mellitus with other specified complication: Secondary | ICD-10-CM

## 2024-03-23 ENCOUNTER — Ambulatory Visit: Admitting: Family Medicine

## 2024-12-06 ENCOUNTER — Ambulatory Visit
